# Patient Record
Sex: Female | Born: 1955
Health system: Southern US, Community
[De-identification: ages and names within clinical notes are randomized; demographics above are authoritative.]

## PROBLEM LIST (undated history)

## (undated) DIAGNOSIS — I10 Essential (primary) hypertension: Secondary | ICD-10-CM

## (undated) DIAGNOSIS — I2510 Atherosclerotic heart disease of native coronary artery without angina pectoris: Secondary | ICD-10-CM

## (undated) DIAGNOSIS — K219 Gastro-esophageal reflux disease without esophagitis: Secondary | ICD-10-CM

## (undated) DIAGNOSIS — M549 Dorsalgia, unspecified: Secondary | ICD-10-CM

## (undated) DIAGNOSIS — M1990 Unspecified osteoarthritis, unspecified site: Secondary | ICD-10-CM

## (undated) DIAGNOSIS — F32A Depression, unspecified: Secondary | ICD-10-CM

## (undated) DIAGNOSIS — E785 Hyperlipidemia, unspecified: Secondary | ICD-10-CM

## (undated) DIAGNOSIS — M797 Fibromyalgia: Secondary | ICD-10-CM

## (undated) DIAGNOSIS — G8929 Other chronic pain: Secondary | ICD-10-CM

## (undated) DIAGNOSIS — E119 Type 2 diabetes mellitus without complications: Secondary | ICD-10-CM

## (undated) HISTORY — DX: Atherosclerotic heart disease of native coronary artery without angina pectoris: I25.10

## (undated) HISTORY — PX: TUBAL LIGATION: SHX77

## (undated) HISTORY — PX: HERNIA REPAIR: SHX51

## (undated) HISTORY — PX: POSTERIOR FUSION LUMBAR SPINE: SUR632

## (undated) HISTORY — PX: COLONOSCOPY: SHX174

## (undated) HISTORY — PX: BACK SURGERY: SHX140

## (undated) HISTORY — DX: Hyperlipidemia, unspecified: E78.5

## (undated) HISTORY — PX: VAGINAL HYSTERECTOMY: SUR661

## (undated) HISTORY — DX: Essential (primary) hypertension: I10

---

## 2004-12-23 ENCOUNTER — Inpatient Hospital Stay (HOSPITAL_COMMUNITY)
Admission: RE | Admit: 2004-12-23 | Discharge: 2004-12-28 | Payer: Self-pay | Admitting: Thoracic Surgery (Cardiothoracic Vascular Surgery)

## 2004-12-23 HISTORY — PX: CORONARY ARTERY BYPASS GRAFT: SHX141

## 2005-01-13 ENCOUNTER — Encounter
Admission: RE | Admit: 2005-01-13 | Discharge: 2005-01-13 | Payer: Self-pay | Admitting: Thoracic Surgery (Cardiothoracic Vascular Surgery)

## 2005-04-25 ENCOUNTER — Ambulatory Visit (HOSPITAL_COMMUNITY): Admission: RE | Admit: 2005-04-25 | Discharge: 2005-04-25 | Payer: Self-pay | Admitting: *Deleted

## 2006-04-27 ENCOUNTER — Encounter
Admission: RE | Admit: 2006-04-27 | Discharge: 2006-04-27 | Payer: Self-pay | Admitting: Thoracic Surgery (Cardiothoracic Vascular Surgery)

## 2006-04-27 ENCOUNTER — Ambulatory Visit: Payer: Self-pay | Admitting: Thoracic Surgery (Cardiothoracic Vascular Surgery)

## 2007-03-11 HISTORY — PX: GASTRIC BYPASS: SHX52

## 2008-01-13 ENCOUNTER — Ambulatory Visit (HOSPITAL_COMMUNITY): Admission: RE | Admit: 2008-01-13 | Discharge: 2008-01-13 | Payer: Self-pay | Admitting: Cardiology

## 2008-01-13 HISTORY — PX: CARDIAC CATHETERIZATION: SHX172

## 2008-04-12 HISTORY — PX: DOPPLER ECHOCARDIOGRAPHY: SHX263

## 2010-03-31 ENCOUNTER — Encounter: Payer: Self-pay | Admitting: Thoracic Surgery (Cardiothoracic Vascular Surgery)

## 2010-04-14 ENCOUNTER — Emergency Department (HOSPITAL_COMMUNITY): Payer: BC Managed Care – PPO

## 2010-04-14 ENCOUNTER — Observation Stay (HOSPITAL_COMMUNITY)
Admission: EM | Admit: 2010-04-14 | Discharge: 2010-04-15 | Disposition: A | Discharge status: Home / Self Care | Payer: BC Managed Care – PPO | Attending: Cardiology | Admitting: Cardiology

## 2010-04-14 DIAGNOSIS — E119 Type 2 diabetes mellitus without complications: Secondary | ICD-10-CM | POA: Diagnosis present

## 2010-04-14 DIAGNOSIS — I10 Essential (primary) hypertension: Secondary | ICD-10-CM | POA: Diagnosis present

## 2010-04-14 DIAGNOSIS — R0989 Other specified symptoms and signs involving the circulatory and respiratory systems: Secondary | ICD-10-CM | POA: Insufficient documentation

## 2010-04-14 DIAGNOSIS — M129 Arthropathy, unspecified: Secondary | ICD-10-CM | POA: Insufficient documentation

## 2010-04-14 DIAGNOSIS — R0609 Other forms of dyspnea: Secondary | ICD-10-CM | POA: Insufficient documentation

## 2010-04-14 DIAGNOSIS — J45909 Unspecified asthma, uncomplicated: Secondary | ICD-10-CM | POA: Insufficient documentation

## 2010-04-14 DIAGNOSIS — I2510 Atherosclerotic heart disease of native coronary artery without angina pectoris: Secondary | ICD-10-CM | POA: Diagnosis present

## 2010-04-14 DIAGNOSIS — K219 Gastro-esophageal reflux disease without esophagitis: Secondary | ICD-10-CM | POA: Diagnosis present

## 2010-04-14 DIAGNOSIS — Z7982 Long term (current) use of aspirin: Secondary | ICD-10-CM

## 2010-04-14 DIAGNOSIS — Z951 Presence of aortocoronary bypass graft: Secondary | ICD-10-CM

## 2010-04-14 DIAGNOSIS — Z9884 Bariatric surgery status: Secondary | ICD-10-CM

## 2010-04-14 DIAGNOSIS — R0789 Other chest pain: Principal | ICD-10-CM | POA: Diagnosis present

## 2010-04-14 DIAGNOSIS — K5730 Diverticulosis of large intestine without perforation or abscess without bleeding: Secondary | ICD-10-CM | POA: Insufficient documentation

## 2010-04-14 LAB — CBC
HCT: 43.2 % (ref 36.0–46.0)
Hemoglobin: 14.8 g/dL (ref 12.0–15.0)
MCH: 30.8 pg (ref 26.0–34.0)
MCHC: 34.3 g/dL (ref 30.0–36.0)
MCV: 89.8 fL (ref 78.0–100.0)
Platelets: 255 10*3/uL (ref 150–400)
RBC: 4.81 MIL/uL (ref 3.87–5.11)
RDW: 12.7 % (ref 11.5–15.5)
WBC: 4.8 10*3/uL (ref 4.0–10.5)

## 2010-04-14 LAB — BASIC METABOLIC PANEL
BUN: 10 mg/dL (ref 6–23)
CO2: 27 mEq/L (ref 19–32)
Calcium: 9.9 mg/dL (ref 8.4–10.5)
Chloride: 104 mEq/L (ref 96–112)
Creatinine, Ser: 0.83 mg/dL (ref 0.4–1.2)
GFR calc Af Amer: >60 mL/min (ref >60)
GFR calc non Af Amer: >60 mL/min (ref >60)
Glucose, Bld: 87 mg/dL (ref 70–99)
Potassium: 4.1 mEq/L (ref 3.5–5.1)
Sodium: 140 mEq/L (ref 135–145)

## 2010-04-14 LAB — DIFFERENTIAL
Basophils Absolute: 0 10*3/uL (ref 0.0–0.1)
Basophils Relative: 1 % (ref 0–1)
Eosinophils Absolute: 0.3 10*3/uL (ref 0.0–0.7)
Eosinophils Relative: 7 % — ABNORMAL HIGH (ref 0–5)
Lymphocytes Relative: 43 % (ref 12–46)
Lymphs Abs: 2.1 10*3/uL (ref 0.7–4.0)
Monocytes Absolute: 0.4 10*3/uL (ref 0.1–1.0)
Monocytes Relative: 8 % (ref 3–12)
Neutro Abs: 2 10*3/uL (ref 1.7–7.7)
Neutrophils Relative %: 41 % — ABNORMAL LOW (ref 43–77)

## 2010-04-14 LAB — MRSA PCR SCREENING: MRSA by PCR: NEGATIVE

## 2010-04-14 LAB — TROPONIN I: Troponin I: 0.01 ng/mL (ref 0.00–0.06)

## 2010-04-14 LAB — PROTIME-INR
INR: 0.99 (ref 0.00–1.49)
Prothrombin Time: 13.3 seconds (ref 11.6–15.2)

## 2010-04-14 LAB — CK TOTAL AND CKMB (NOT AT ARMC)
CK, MB: 1.5 ng/mL (ref 0.3–4.0)
Relative Index: 1.2 (ref 0.0–2.5)
Total CK: 126 U/L (ref 7–177)

## 2010-04-14 LAB — CARDIAC PANEL(CRET KIN+CKTOT+MB+TROPI)
CK, MB: 0.9 ng/mL (ref 0.3–4.0)
Relative Index: INVALID (ref 0.0–2.5)
Total CK: 94 U/L (ref 7–177)
Troponin I: <0.01 ng/mL (ref 0.00–0.06)

## 2010-04-14 LAB — BRAIN NATRIURETIC PEPTIDE: Pro B Natriuretic peptide (BNP): 30 pg/mL (ref 0.0–100.0)

## 2010-04-15 LAB — HEMOGLOBIN A1C
Hgb A1c MFr Bld: 5.3 % (ref <5.7)
Mean Plasma Glucose: 105 mg/dL (ref <117)

## 2010-04-15 LAB — GLUCOSE, CAPILLARY
Glucose-Capillary: 81 mg/dL (ref 70–99)
Glucose-Capillary: 85 mg/dL (ref 70–99)

## 2010-04-15 LAB — LIPID PANEL
Cholesterol: 129 mg/dL (ref 0–200)
HDL: 70 mg/dL (ref >39)
LDL Cholesterol: 56 mg/dL (ref 0–99)
Total CHOL/HDL Ratio: 1.8 RATIO
Triglycerides: 15 mg/dL (ref <150)
VLDL: 3 mg/dL (ref 0–40)

## 2010-04-15 LAB — TSH: TSH: 1.268 u[IU]/mL (ref 0.350–4.500)

## 2010-04-26 NOTE — Discharge Summary (Signed)
Tamara Nunez, Tamara Nunez               ACCOUNT NO.:  [id number]  MEDICAL RECORD NO.:  [id number]           PATIENT TYPE:  I  LOCATION:  2922                         FACILITY:  [facility]  PHYSICIAN:  [place] Tamirra Sienkiewicz, MD         DATE OF BIRTH:  1955/09/18  DATE OF ADMISSION:  04/14/2010 DATE OF DISCHARGE:  04/15/2010                              DISCHARGE SUMMARY   DISCHARGE DIAGNOSES: 1. Chest pain, atypical, negative cardiac enzymes. 2. Hypertension. 3. Gastroesophageal reflux. 4. Diabetes mellitus, type 2.  HOSPITAL COURSE:  Tamara Nunez is a 55 year old African American female with history of coronary artery bypass grafting x2 in October 2006 with LIMA to the proximal LAD and SVG to the distal LAD.  Her last cath was done in 2009 which showed patent left main and proximal LAD and 95% stenosis at the insertion of the LIMA into the proximal LAD with patent vein graft to the distal LAD and normal ejection fraction.  History also includes diabetes mellitus type 2, gastric bypass surgery 4-1/2 years ago, hypertension, gastroesophageal reflux, arthritis, diverticulosis, and asthma.  She presented with chest pain, it is sharp, off and on, since Tuesday and on Wednesday, it was worse and rated as 10/10.  She stated 2 sprays of nitroglycerin provided some relief.  The patient was admitted to step-down unit with current active chest pain, 6/10, for observation.  She was started on VTE heparin.  Cardiac enzymes were checked again x2 q.8 h.  EKG was ordered in the morning and BNP, TSH, lipids, PT/INR, hemoglobin A1c.  Diet was heart healthy.  She was also given Ultram 50 mg p.o. q.6 h. p.r.n. for pain.  As of this morning, April 15, 2010, she has no complaints of pain overnight.  Cardiac enzymes were negative and she has been seen by Dr. Rennis Golden who feels she is stable for discharge with a followup nuclear stress test this week in our office at [facility].  DISCHARGE LABORATORY  DATA:  WBCs 4.8, hemoglobin 14.8, hematocrit 43.2, platelets 255.  INR 0.99.  Sodium 140, potassium 4.1, chloride 104, carbon dioxide 27, glucose 87, BUN 10, creatinine 0.83.  Hemoglobin A1c was 5.3, mean plasma glucose 105.  BNP was 30.  Total cholesterol 129, triglycerides 15, HDL 70, LDL 56.  Thyroid stimulating hormone 1.268.  STUDIES/PROCEDURES:  Chest x-ray, no acute cardiopulmonary disease. Normal heart size and mediastinal contours for age.  DISCHARGE MEDICATIONS: 1. Acetaminophen 325 mg tablets, two tablets by mouth every 4 hours as     needed for pain. 2. Albuterol inhaler 1-2 puffs inhaled every 4 hours as needed for     shortness of breath and wheezing. 3. Aspirin enteric coated 325 mg one tablet by mouth every morning. 4. Calcium over the counter 1 tablet by mouth twice daily. 5. Cetirizine 10 mg one tablet by mouth every morning. 6. Lasix 20 mg one tablet by mouth daily as needed for swelling. 7. Metoprolol tartrate 25 mg one tablet by mouth twice daily. 8. Multivitamin therapeutic one tablet by mouth twice daily. 9. Pantoprazole 40 mg one tablet by mouth twice daily.  10.Ramipril 5 mg one capsule by mouth every evening. 11.Ranexa 1000 mg one tablet by mouth twice daily. 12.Simvastatin 20 mg one tablet by mouth every evening. 13.Veramyst spray as needed. 14.Vitamin B12 - 1000 mcg one tablet by mouth every morning. 15.Vitamin D 1000 units one capsule by mouth every morning.  DISPOSITION:  Tamara Nunez will be discharged home in stable condition. She is to eat a heart-healthy diet.  She will follow up at [facility] for a stress test and our office will call her with the appointment time, and she will have a subsequent followup with Dr. Lynnea Ferrier thereafter and our office will call her with that time as well.    ______________________________ Wilburt Finlay, PA   ______________________________ [place] Keirah Konitzer, MD    BH/MEDQ  D:  04/15/2010  T:   04/16/2010  Job:  [id number]  cc:   Ritta Slot, MD  Electronically Signed by Wilburt Finlay PA on 04/24/2010 03:59:14 PM Electronically Signed by Kirtland Bouchard. Alania Overholt M.D. on 04/26/2010 11:18:03 AM

## 2010-04-30 ENCOUNTER — Other Ambulatory Visit (HOSPITAL_COMMUNITY): Payer: Self-pay | Admitting: Gastroenterology

## 2010-04-30 DIAGNOSIS — R112 Nausea with vomiting, unspecified: Secondary | ICD-10-CM

## 2010-05-20 ENCOUNTER — Ambulatory Visit (HOSPITAL_COMMUNITY)
Admission: RE | Admit: 2010-05-20 | Discharge: 2010-05-20 | Disposition: A | Discharge status: Home / Self Care | Payer: BC Managed Care – PPO | Source: Ambulatory Visit | Attending: Gastroenterology | Admitting: Gastroenterology

## 2010-05-20 DIAGNOSIS — R112 Nausea with vomiting, unspecified: Secondary | ICD-10-CM | POA: Insufficient documentation

## 2010-05-20 DIAGNOSIS — R109 Unspecified abdominal pain: Secondary | ICD-10-CM | POA: Insufficient documentation

## 2010-05-20 MED ORDER — TECHNETIUM TC 99M MEBROFENIN IV KIT
5.0000 | PACK | Freq: Once | INTRAVENOUS | Status: AC | PRN
  Administered 2010-05-20: 5 via INTRAVENOUS

## 2010-07-26 NOTE — Discharge Summary (Signed)
NAMEISADORE, Tamara Nunez               ACCOUNT NO.:  [id number]   MEDICAL RECORD NO.:  [id number]          PATIENT TYPE:  INP   LOCATION:  2041                         FACILITY:  [facility]   PHYSICIAN:  Salvatore Decent. Dorris Fetch, M.D.DATE OF BIRTH:  1955/08/10   DATE OF ADMISSION:  12/23/2004  DATE OF DISCHARGE:  12/28/2004                                 DISCHARGE SUMMARY   PRIMARY ADMITTING DIAGNOSIS:  Coronary artery disease.   ADDITIONAL/DISCHARGE DIAGNOSES:  1.  Single vessel coronary artery disease.  2.  Refractory angina.  3.  Morbid obesity.  4.  Type 2 diabetes mellitus.  5.  Hypertension.  6.  Gastroesophageal reflux disease.  7.  Arthritis.  8.  Status post two previous back surgeries.  9.  Diverticulosis.  10. Asthma.   PROCEDURES PERFORMED:  1.  Off-pump coronary artery bypass grafting x2 (left internal mammary      artery to the proximal LAD, saphenous vein graft to the distal LAD).  2.  Open saphenous vein harvest right lower leg.   HISTORY:  The patient is a 55 year old black female who was referred to Dr.  Dorris Fetch by Dr. Rico Junker in [place] for evaluation of coronary  artery disease.  She has had refractory angina for over a year now and has  been treated medically without significant improvement. Her symptoms have  continued to progress and she underwent recent cardiac catheterization which  showed no significant disease in the circumflex with a right system total  occlusion of the LAD with second total occlusion in the mid portion of the  vessel. And Dr. Dorris Fetch reviewed his films and agreed that the best  course of action would be to proceed with surgical revascularization as she  was a poor candidate for percutaneous intervention. He explained the risks,  benefits and alternatives of the procedure to the patient and she agreed to  surgery.   HOSPITAL COURSE:  Ms. Bloch was admitted to [facility] on December 23, 2004 and was taken to the  operating room where she underwent off-pump  coronary artery bypass grafting x2 as described in detail above. She  tolerated the procedure well and was transferred to the SICU in stable  condition. She was able to be extubated shortly after surgery. She was  hemodynamically stable and doing well on postop day #1. Late in the day on  postop day #1, she was transferred to the floor. Overall, she has had an  uneventful postoperative course. She is ambulating in the halls without  problem with cardiac rehab phase one as well as independently. She has  maintained normal sinus rhythm and has remained afebrile with stable vital  signs throughout her admission. She has been somewhat volume overloaded and  has diuresed very well but is still 2-3 pounds above her preoperative  weight. She did develop some serosanguineous drainage from the upper portion  of her sternal incision and was started on Keflex. She is tolerating regular  diet and is having normal bowel and bladder function. Her most recent labs  showed a hemoglobin of 10.6, hematocrit 31.3, white count  12.3, platelets  293, sodium of 138, potassium 3.8, BUN 13, creatinine 1.1.  Her blood sugars  have remained stable on her home medication regimen.  It is felt if she  continues to progress over the next 24 hours and no acute changes occur, she  should hopefully be ready for discharge home on December 28, 2004.   DISCHARGE MEDICATIONS:  1.  Enteric-coated aspirin 325 mg q.d.  2.  Atenolol 50 mg q.d.  3.  Altace 5 mg q.d.  4.  Zocor 20 mg q.d.  5.  Keflex 500 mg t.i.d. x1 week.  6.  Nu-Iron 150 mg q.d.  7.  Folic acid 1 mg q.d.  8.  Lasix 40 mg q.d. x1 week.  9.  K-Dur 20 mEq q.h.s. x1 week.  10. Prevacid 30 mg b.i.d.  11. Allegra 180 mg q.d.  12. Starlix 120 mg t.i.d.  13. Singulair 10 mg q.d.  14. Tylox 1-2 q.4h. p.r.n. for pain.   DISCHARGE INSTRUCTIONS:  She is asked to refrain from driving, heavy lifting  or strenuous activity.  She may continue ambulating daily and using her  incentive spirometer. She may shower daily and clean her incisions with soap  and water. She will continue a low-fat, low-sodium, carbohydrate modified  diet.   DISCHARGE FOLLOWUP:  She is asked to make an appointment to see Dr. Rico Junker in two weeks. She will see Dr. Dorris Fetch on November 6th at 12 p.m.  She will have a chest x-ray at [facility] one hour prior to  this appointment and she will bring her films for him to review. She will  call our office in the interim if she experiences any problems or has  questions.      Coral Ceo, P.A.    ______________________________  Salvatore Decent Dorris Fetch, M.D.    GC/MEDQ  D:  12/27/2004  T:  12/27/2004  Job:  [id number]   cc:   [facility]. [facility]  [address]  [place], [place] [address]   Dr. Darrell Jewel

## 2010-07-26 NOTE — Cardiovascular Report (Signed)
Tamara Nunez, Tamara Nunez               ACCOUNT NO.:  [id number]   MEDICAL RECORD NO.:  [id number]          PATIENT TYPE:  OIB   LOCATION:  2899                         FACILITY:  [facility]   PHYSICIAN:  Darlin Priestly, MD  DATE OF BIRTH:  1955-12-05   DATE OF PROCEDURE:  04/25/2005  DATE OF DISCHARGE:                              CARDIAC CATHETERIZATION   PROCEDURES:  1.  Left heart catheterization.  2.  Coronary angiography.  3.  Left ventriculogram.  4.  Saphenous vein graft angiography.  5.  Left internal mammary angiography.   COMPLICATIONS:  None.   INDICATION:  Tamara Nunez is a 55 year old female, patient of Dr. Caryl Asp  in [place], [place], with a history of non-insulin-dependent diabetes  mellitus, history of CAD, status post bypass surgery in October 2006 by Dr.  Jamesetta Orleans, consisting of a LIMA to LAD and vein graft to the distal  LAD.  She recently has complained of increasing substernal chest pain,  reminiscent of her prior angina.  She is now brought for repeat  catheterization to reassess her graft status.    After informed consent, the patient was brought to the cardiac cath lab.  The right and left groin were shaved, prepped and draped in the usual  sterile fashion.  ECG monitoring  established.  Using modified Seldinger  technique, a #6 French arterial sheath was inserted in the right femoral  artery.  A #5 French venous sheath was inserted in the right femoral vein.  A 6 French diagnostic catheter was needed to perform diagnostic angiography.   The left main is a large vessel with no significant disease.   LAD is a large vessel __________ 2 diagonal branches, and it is totally  occluded.  The mid LAD fills via a LIMA, which appears to be atretic and  appears to have a high-grade stenosis prior to the insertion in the LAD,  though this is very tortuous vessel and would make percutaneous intervention  of the IMA unfavorable.  There is a flow into the  LAD via the IMA.   There is also a patent vein graft inserted into the distal portion of the  LAD, which is a small graft and does have some pressure damping when  engaged, but it does not appear to have any high-grade stenosis.  There does  not appear to be a stenotic lesion beyond the graft.  This does retrograde  fill the proximal portion of the LAD and a third small diagonal branch.   Left circumflex is medium-sized vessel that courses in the AV groove  __________ marginal branches.  The AV groove circumflex has mild 20%  irregularity, but has no  high-grade stenosis.   First OM is a small-to-medium sized vessel with 70% ostial lesion.   Second OM is a large vessel with no significant disease.   The third OM is a small vessels with no significant disease.   The right coronary artery is a large vessel is dominant, PDS, __________  branch.  There is mild 30% diffuse mid RCA narrowing, but no high-grade  stenosis.  The __________  posterolateral branch have no significant disease.   Left ventriculogram has a low-normal EF of 50% with mild anterolateral  hypokinesis.   HEMODYNAMICS:  Systemic arterial pressure 145/85, LV systolic pressure  130/2, LVDP of 5.   CONCLUSIONS:  1.  Significant 1-vessel CAD.  2.  Patent LIMA to the LAD; however, the IMA appears atretic and is very      tortuous with a probable high-grade stenosis prior to the insertion,      though this would be unfavorable from a percutaneous standpoint      secondary to tortuosity.  3.  Patent saphenous vein graft to the distal LAD.  4.  Normal LV systolic function with mild wall motion abnormalities noted      above.  5.  Systemic hypertension.      Darlin Priestly, MD  Electronically Signed     RHM/MEDQ  D:  04/25/2005  T:  04/25/2005  Job:  [id number]   cc:   Caryl Asp, MD  [place], [place]

## 2010-07-26 NOTE — Op Note (Signed)
NAMECATHYANN, KILFOYLE               ACCOUNT NO.:  [id number]   MEDICAL RECORD NO.:  [id number]          PATIENT TYPE:  INP   LOCATION:  2315                         FACILITY:  [facility]   PHYSICIAN:  Salvatore Decent. Dorris Fetch, M.D.DATE OF BIRTH:  14-Feb-1956   DATE OF PROCEDURE:  12/23/2004  DATE OF DISCHARGE:                                 OPERATIVE REPORT   PREOPERATIVE DIAGNOSIS:  Single-vessel coronary disease with refractory  angina.   POSTOPERATIVE DIAGNOSIS:  Single-vessel coronary disease with refractory  angina.   PROCEDURE:  Median sternotomy, off-pump coronary artery bypass grafting x2  (left internal mammary artery to proximal left anterior descending artery,  saphenous vein graft to distal left anterior descending artery).   SURGEON:  Salvatore Decent. Dorris Fetch, M.D.   ASSISTANT:  Pecola Leisure, P.A.   ANESTHESIA:  General.   FINDINGS:  Morbid obesity.  Good-quality conduits.  Distal LAD fair-quality,  proximal LAD poor-quality targets.   CLINICAL NOTE:  Ms. Ivanov is a 55 year old African-American female who is  morbidly obese has multiple other cardiac risk factors including diabetes.  She approximately a year ago was found to have a positive stress test and a  total occlusion of her LAD, which reconstituted via collaterals with both  the proximal and distal segment.  She had been managed medically but  continued to have refractory angina, which had gotten progressively worse.  She underwent repeat cardiac catheterization, which showed no additional  coronary disease but confirmed the LAD disease as previously seen.  Because  of angina refractory to maximal medical therapy, she was referred for  coronary artery bypass grafting.  The indications, risks, benefits and  alternatives for the procedure were discussed in detail with the patient,  including the possibility that she would continue to have angina  postoperatively.  She understood and accepted the risk of surgery  and agreed  to proceed.   OPERATIVE NOTE:  Ms. Sarwar was brought the preop holding area on December 23, 2004.  There the anesthesia service placed lines to monitor arterial,  central venous and pulmonary arterial pressure.  Intravenous antibiotics  were administered.  She was taken to the operating room, anesthetized and  intubated.  A Foley catheter was placed.  The chest, abdomen and legs were  prepped and draped in the usual fashion.   A median sternotomy was performed.  The left internal mammary artery was  harvested using standard technique.  This was very difficult due the  patient's obese body habitus, but the mammary artery was a relatively small  but good-quality conduit which had excellent flow once taken down.  Simultaneously an incision was made in the medial aspect of the right leg at  the level of the ankle and the saphenous vein was harvested from the lower  leg using open technique.  This wound was closed in standard fashion.   The patient was given a full heparin dose for off-pump grafting prior to  dividing the distal end of the mammary artery.  There was excellent flow  through the cut end of the vessel.  The pericardium then was opened.  The  patient was placed in Trendelenburg position and rotated to the right side.  Deep pericardial stay sutures were placed in the pericardium and retracted  to prolapse the apex of the heart anteriorly, which allowed good  visualization of the LAD.  The LAD was inspected both in its proximal and  distal aspects and anastomotic sites were chosen.  After confirming adequate  anticoagulation with ACT measurement, Silastic tapes were placed proximally  and distally to the site selected for an anastomosis in the distal LAD.  The  Guidant stabilizer system was used to stabilize the heart.  The patient  tolerated this well without any hemodynamic changes or signs of ischemia  throughout either anastomosis.  An arteriotomy was made in the  distal LAD.  This was a 1.5-mm fair-quality vessel at this site.  The vein graft was then  anastomosed end-to-side with a running 7-0 Prolene suture.  At the  completion of the anastomosis, a probe passed easily proximally and  distally.  The Silastic tapes were released to allow de-airing, and the  suture was tied.  There was excellent flow through this graft.  The heart  then was allowed to rest back in its standard position.  The graft was cut  to length proximally.  A partial occlusion clamp was placed on the ascending  aorta.  A 4.5 mm punch aortotomy was made in the aorta and the vein graft  was anastomosed to the aorta with a running 6-0 Prolene suture, and this  anastomosis was de-aired by releasing the partial clamp before tying the  suture.   The deep pericardial sutures were once again retracted, again prolapsing  apex of the heart anteriorly.  Again the patient tolerated this well  hemodynamically.  Next the proximal LAD was exposed.  An arteriotomy was  made, Silastic tapes were then placed proximally and distally.  There was  backbleeding through collaterals.  The left mammary artery was brought  through a window in the pericardium.  The distal limb was spatulated then  was anastomosed end-to-side to the LAD with a running 8-0 Prolene suture.  At completion of the anastomosis, the  bulldog clamp was removed from the  mammary artery, de-airing the anastomosis.  The mammary pedicle was tacked  to the epicardial surface of the heart with  6-0 Prolene sutures.  There was  good hemostasis at the anastomosis.   All anastomoses then were reinspected for hemostasis.  The deep pericardial  sutures were removed.  The chest was irrigated with 1 L of warm normal  saline and 1 g of vancomycin.  Hemostasis was achieved.  The pericardium was  reapproximated with interrupted 3-0 silk sutures.  It came together easily without tension.  A single mediastinal tube and a single left pleural  tube  were placed through separate subcostal incisions.  Pacing wires were placed  on the atrium and ventricle prior to closing the pericardium.  The protamine  was administered without incident.  The chest then was closed using simple  and figure-of-eight heavy-gauge double stainless steel wires.  The  pectoralis fascia was closed with running #1 Vicryl suture.  The  subcutaneous tissue and skin were closed in standard fashion.  A  subcuticular closure was used for the skin.  All sponge, needle and  instrument counts were correct at the end of the procedure.  There no  intraoperative complications.  The patient was taken from the operating room  to the surgical intensive care unit in critical but  stable condition.           ______________________________  Salvatore Decent Dorris Fetch, M.D.     SCH/MEDQ  D:  12/23/2004  T:  12/23/2004  Job:  [id number]   cc:   Rico Junker, M.D.  [place], [place]   Ladean Raya, M.D.  [place], [place]

## 2010-12-10 LAB — CBC
HCT: 40.5
Hemoglobin: 13.6
MCHC: 33.6
MCV: 94.2
Platelets: 248
RBC: 4.3
RDW: 12.6
WBC: 6.2

## 2010-12-10 LAB — BASIC METABOLIC PANEL
BUN: 12
CO2: 27
Calcium: 9.2
Chloride: 106
Creatinine, Ser: 0.76
GFR calc Af Amer: >60
GFR calc non Af Amer: >60
Glucose, Bld: 77
Potassium: 4.3
Sodium: 140

## 2010-12-10 LAB — PROTIME-INR
INR: 1.1
Prothrombin Time: 14.3

## 2010-12-10 LAB — GLUCOSE, CAPILLARY: Glucose-Capillary: 69 — ABNORMAL LOW

## 2012-06-01 ENCOUNTER — Other Ambulatory Visit (HOSPITAL_COMMUNITY): Payer: Self-pay | Admitting: Internal Medicine

## 2012-06-01 DIAGNOSIS — R079 Chest pain, unspecified: Secondary | ICD-10-CM

## 2012-06-03 ENCOUNTER — Ambulatory Visit (HOSPITAL_COMMUNITY)
Admission: RE | Admit: 2012-06-03 | Discharge: 2012-06-03 | Disposition: A | Discharge status: Home / Self Care | Payer: Commercial Managed Care - PPO | Source: Ambulatory Visit | Attending: Internal Medicine | Admitting: Internal Medicine

## 2012-06-03 DIAGNOSIS — R079 Chest pain, unspecified: Secondary | ICD-10-CM | POA: Insufficient documentation

## 2012-06-03 DIAGNOSIS — J45909 Unspecified asthma, uncomplicated: Secondary | ICD-10-CM | POA: Insufficient documentation

## 2012-06-03 DIAGNOSIS — I2510 Atherosclerotic heart disease of native coronary artery without angina pectoris: Secondary | ICD-10-CM | POA: Insufficient documentation

## 2012-06-03 DIAGNOSIS — R0989 Other specified symptoms and signs involving the circulatory and respiratory systems: Secondary | ICD-10-CM | POA: Insufficient documentation

## 2012-06-03 DIAGNOSIS — R42 Dizziness and giddiness: Secondary | ICD-10-CM | POA: Insufficient documentation

## 2012-06-03 DIAGNOSIS — R0609 Other forms of dyspnea: Secondary | ICD-10-CM | POA: Insufficient documentation

## 2012-06-03 DIAGNOSIS — I10 Essential (primary) hypertension: Secondary | ICD-10-CM | POA: Insufficient documentation

## 2012-06-03 DIAGNOSIS — Z951 Presence of aortocoronary bypass graft: Secondary | ICD-10-CM | POA: Insufficient documentation

## 2012-06-03 HISTORY — PX: NM MYOVIEW LTD: HXRAD82

## 2012-06-03 MED ORDER — TECHNETIUM TC 99M SESTAMIBI GENERIC - CARDIOLITE
30.4000 | Freq: Once | INTRAVENOUS | Status: AC | PRN
  Administered 2012-06-03: 30 via INTRAVENOUS

## 2012-06-03 MED ORDER — REGADENOSON 0.4 MG/5ML IV SOLN
0.4000 mg | Freq: Once | INTRAVENOUS | Status: AC
  Administered 2012-06-03: 0.4 mg via INTRAVENOUS

## 2012-06-03 MED ORDER — TECHNETIUM TC 99M SESTAMIBI GENERIC - CARDIOLITE
10.6000 | Freq: Once | INTRAVENOUS | Status: AC | PRN
  Administered 2012-06-03: 11 via INTRAVENOUS

## 2012-06-03 NOTE — Procedures (Addendum)
[facility] [address] [place] [address] [phone]  Cardiology Nuclear Med Study  Tamara Nunez is a 57 y.o. female     MRN : [id number]     DOB: 12-05-1955  Procedure Date: 06/03/2012  Nuclear Med Background Indication for Stress Test:  Graft Patency History:  Asthma and SEASONAL ASTHMATIC;CAD;CABG X2--2006 Cardiac Risk Factors: Family History - CAD, Hypertension, Lipids, NIDDM and Overweight  Symptoms:  Chest Pain, DOE, Fatigue, Light-Headedness and SOB   Nuclear Pre-Procedure Caffeine/Decaff Intake:  10:00pm NPO After: 8:00am   IV Site: R Hand  IV 0.9% NS with Angio Cath:  22g  Chest Size (in):  N/A IV Started by: Emmit Pomfret, RN  Height: 5\' 5"  (1.651 m)  Cup Size: D  BMI:  Body mass index is 25.46 kg/(m^2). Weight:  153 lb (69.4 kg)   Tech Comments:  N/A    Nuclear Med Study 1 or 2 day study: 1 day  Stress Test Type:  Lexiscan  Order Authorizing Provider:  KENNETH [place] HILTY, MD    Resting Radionuclide: Technetium 59m Sestamibi  Resting Radionuclide Dose: 10.6 mCi   Stress Radionuclide:  Technetium 20m Sestamibi  Stress Radionuclide Dose: 30.4 mCi           Stress Protocol Rest HR: 60 Stress HR: 118  Rest BP: 128/78 Stress BP: 136/72  Exercise Time (min): n/a METS: n/a   Predicted Max HR: 163 bpm % Max HR: 72.39 bpm Rate Pressure Product: 21308  Dose of Adenosine (mg):  n/a Dose of Lexiscan: 0.4 mg  Dose of Atropine (mg): n/a Dose of Dobutamine: n/a mcg/kg/min (at max HR)  Stress Test Technologist: Esperanza Sheets, CCT Nuclear Technologist: Gonzella Lex, CNMT   Rest Procedure:  Myocardial perfusion imaging was performed at rest 45 minutes following the intravenous administration of Technetium 45m Sestamibi. Stress Procedure:  The patient received IV Lexiscan 0.4 mg over 15-seconds.  Technetium 38m Sestamibi injected at 30-seconds.  There were no significant changes with Lexiscan.   Quantitative spect images were obtained after a 45 minute delay.  Transient Ischemic Dilatation (Normal <1.22):  1.14 Lung/Heart Ratio (Normal <0.45):  0.22 QGS EDV:  68 ml QGS ESV:  20 ml LV Ejection Fraction: 71%  Signed by: Gonzella Lex, CNMT  Rest ECG: NSR - Normal EKG  Stress ECG: There was definite T wave inversion in II, III, aVF as well as V2-V6 with Lexiscan infusion,that resolved post infusion.  QPS Raw Data Images:  There is no interference from nuclear activity from structures below the diaphragm.  , Mild breast attenuation; normal left ventricular size. Stress Images:  Normal homogeneous uptake in all areas of the myocardium. Rest Images:  Normal homogeneous uptake in all areas of the myocardium. Subtraction (SDS):  Minor anteroapical breast attenuation noted; No reversibility and no evidence of ischemia or infarction.  Impression Exercise Capacity:  Lexiscan with no exercise. BP Response:  Normal blood pressure response. Clinical Symptoms:  There is dyspnea. ECG Impression:  Notable Inerior and Anterior TWI with Lexiscan -- non-specific with normal imaging study LV Wall Motion:  NL LV Function; NL Wall Motion  Comparison with Prior Nuclear Study: No significant change from previous study  Overall Impression:  Normal stress nuclear study.,  Low risk stress nuclear study.   Marykay Lex, MD  06/03/2012 1:03 PM

## 2012-10-19 IMAGING — CR DG CHEST 1V PORT
1 series · 1 of 1 positions shown · non-contrast
Comparison: 01/13/2008

CLINICAL DATA: Chest discomfort for 2 days.  Diabetic with
hypertension.

PORTABLE CHEST - 1 VIEW

[view not recorded]
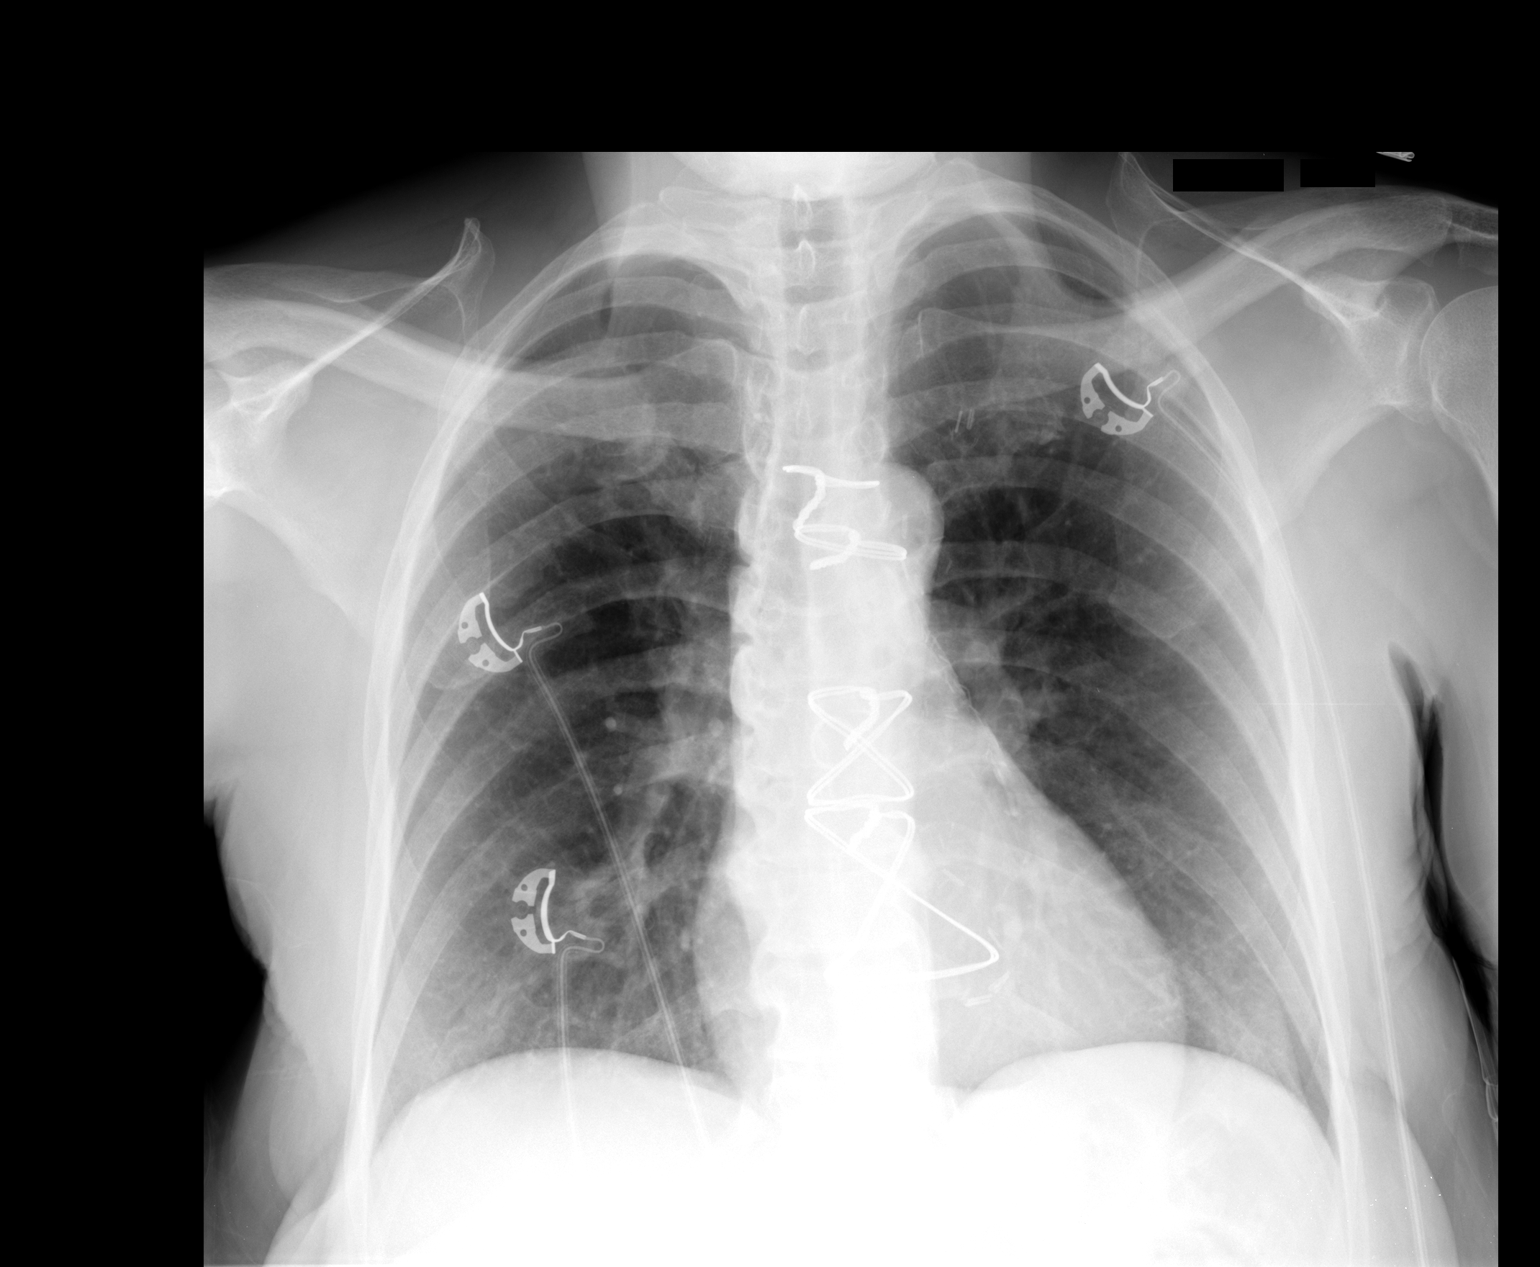

[1 of 1 positions shown; findings below may reference images not displayed]

FINDINGS: Prior median sternotomy. Midline trachea.  Normal heart
size and mediastinal contours for age.   No pleural effusion or
pneumothorax.  Clear lungs.
IMPRESSION: No acute cardiopulmonary disease.

## 2012-11-29 ENCOUNTER — Ambulatory Visit (INDEPENDENT_AMBULATORY_CARE_PROVIDER_SITE_OTHER): Payer: Medicare Other | Admitting: Internal Medicine

## 2012-11-29 ENCOUNTER — Encounter: Payer: Self-pay | Admitting: Internal Medicine

## 2012-11-29 VITALS — BP 130/82 | HR 67 | Ht 65.0 in | Wt 157.0 lb

## 2012-11-29 DIAGNOSIS — I2089 Other forms of angina pectoris: Secondary | ICD-10-CM | POA: Insufficient documentation

## 2012-11-29 DIAGNOSIS — E785 Hyperlipidemia, unspecified: Secondary | ICD-10-CM | POA: Insufficient documentation

## 2012-11-29 DIAGNOSIS — I209 Angina pectoris, unspecified: Secondary | ICD-10-CM

## 2012-11-29 DIAGNOSIS — Z951 Presence of aortocoronary bypass graft: Secondary | ICD-10-CM | POA: Insufficient documentation

## 2012-11-29 DIAGNOSIS — E119 Type 2 diabetes mellitus without complications: Secondary | ICD-10-CM

## 2012-11-29 DIAGNOSIS — I10 Essential (primary) hypertension: Secondary | ICD-10-CM | POA: Insufficient documentation

## 2012-11-29 DIAGNOSIS — E1159 Type 2 diabetes mellitus with other circulatory complications: Secondary | ICD-10-CM | POA: Insufficient documentation

## 2012-11-29 DIAGNOSIS — I208 Other forms of angina pectoris: Secondary | ICD-10-CM

## 2012-11-29 DIAGNOSIS — I2510 Atherosclerotic heart disease of native coronary artery without angina pectoris: Secondary | ICD-10-CM | POA: Insufficient documentation

## 2012-11-29 MED ORDER — RANOLAZINE ER 1000 MG PO TB12: 1000.0000 mg | ORAL_TABLET | Freq: Two times a day (BID) | ORAL | Status: DC

## 2012-11-29 NOTE — Patient Instructions (Addendum)
Your physician wants you to follow-up in: 6 months. You will receive a reminder letter in the mail two months in advance. If you don't receive a letter, please call our office to schedule the follow-up appointment.  Please ask your primary care provider to order some lab work at your next visit - if you can, please have them fax over copies of the results to [phone]

## 2012-11-29 NOTE — Progress Notes (Signed)
OFFICE NOTE  Chief Complaint:  Routine followup  Primary Care Physician: No primary provider on file.  HPI:  Tamara Nunez  is a 57 year old female with history of coronary disease. She had CABG in 2006, LIMA to LAD, saphenous vein graft to distal LAD. She had chest pain and some fatigue after that in 2009 and had a heart cath which showed widely patent grafts. She has continued to have atypical chest pain which worsens with positioning but seemed to improve with nitroglycerin spray. However, she was given isosorbide which she reports today has not helped her symptoms but reported it had helped her in the past. The main side effect is significant headache on isosorbide which is bothersome for her. She also takes Ranexa. She continues to have chest pain and heaviness mostly on her left side but is also a soreness. She says it is more sore when she pushes on the chest wall underneath the tail of the breast. She had a mammogram which currently was negative for any mass and, again, the symptoms do sound to be more musculoskeletal, possibly a radiculopathy either from the cervical root or perhaps a thoracic nerve that comes around the chest wall. She does have a history of reflux but has not had complaints regarding that and I had recommended a stress test to look for any reversible ischemia that could be causing her symptoms. The stress test performed on June 03, 2012, was low risk demonstrating no significant reversibility. The EKG did show some T-wave changes in 2, 3 and aVF and laterally V2 through V6 with Lexiscan that resolved post infusion. However, this is nonspecific.   She returns today with no specific complaints. She occasionally gets chest discomfort from time to time however the symptoms are well managed. Of note she has not had any recent blood work to look at her cholesterol profile  PMHx:  Past Medical History  Diagnosis Date  . Coronary artery disease   . Diabetes mellitus  without complication   . Hyperlipidemia   . Hypertension     Past Surgical History  Procedure Laterality Date  . Cardiac catheterization    . Gastric bypass  2009  . Coronary artery bypass graft  2006    2 vessel - LIMA to LAD (atretic), SVG to distal LAD    FAMHx:  Family History  Problem Relation Age of Onset  . Heart disease Mother   . Cancer - Lung Father     SOCHx:   reports that she has never smoked. She has never used smokeless tobacco. She reports that she does not drink alcohol or use illicit drugs.  ALLERGIES:  Allergies  Allergen Reactions  . Codeine Other (See Comments)    UNSPECIFIED   . Simvastatin Other (See Comments)    UNSPECIFIED     ROS: A comprehensive review of systems was negative.  HOME MEDS: Current Outpatient Prescriptions  Medication Sig Dispense Refill  . albuterol (PROVENTIL HFA;VENTOLIN HFA) 108 (90 BASE) MCG/ACT inhaler Inhale 2 puffs into the lungs every 6 (six) hours as needed for wheezing.      Marland Kitchen aspirin 325 MG EC tablet Take 325 mg by mouth daily.      . Calcium Carbonate-Vitamin D (CALCIUM 600 + D PO) Take by mouth 2 (two) times daily.      . cetirizine (ZYRTEC) 10 MG tablet Take 10 mg by mouth daily.      . fluticasone (VERAMYST) 27.5 MCG/SPRAY nasal spray Place 2 sprays into the  nose daily.      . furosemide (LASIX) 20 MG tablet Take 20 mg by mouth daily as needed.      . isosorbide mononitrate (IMDUR) 30 MG 24 hr tablet Take 30 mg by mouth daily.      . metFORMIN (GLUCOPHAGE) 500 MG tablet Take 500 mg by mouth 2 (two) times daily with a meal.      . metoprolol tartrate (LOPRESSOR) 25 MG tablet Take 25 mg by mouth 2 (two) times daily.      . Multiple Vitamins-Minerals (MULTIVITAMIN PO) Take by mouth daily.      . nitroGLYCERIN (NITROLINGUAL) 0.4 MG/SPRAY spray Place 1 spray under the tongue every 5 (five) minutes as needed for chest pain.      . pantoprazole (PROTONIX) 40 MG tablet Take 40 mg by mouth daily.      . ramipril  (ALTACE) 5 MG capsule Take 5 mg by mouth daily.      . ranolazine (RANEXA) 1000 MG SR tablet Take 1 tablet (1,000 mg total) by mouth 2 (two) times daily.  56 tablet  0  . simvastatin (ZOCOR) 20 MG tablet Take 20 mg by mouth at bedtime.      . vitamin B-12 (CYANOCOBALAMIN) 1000 MCG tablet Take 1,000 mcg by mouth daily.      . vitamin E 400 UNIT capsule Take 400 Units by mouth daily.       No current facility-administered medications for this visit.    LABS/IMAGING: No results found for this or any previous visit (from the past 48 hour(s)). No results found.  VITALS: BP 130/82  Pulse 67  Ht 5\' 5"  (1.651 m)  Wt 157 lb (71.215 kg)  BMI 26.13 kg/m2  EXAM: General appearance: alert and no distress Neck: no adenopathy, no carotid bruit, no JVD, supple, symmetrical, trachea midline and thyroid not enlarged, symmetric, no tenderness/mass/nodules Lungs: clear to auscultation bilaterally Heart: regular rate and rhythm, S1, S2 normal, no murmur, click, rub or gallop Abdomen: soft, non-tender; bowel sounds normal; no masses,  no organomegaly Extremities: extremities normal, atraumatic, no cyanosis or edema Pulses: 2+ and symmetric Skin: Skin color, texture, turgor normal. No rashes or lesions Neurologic: Grossly normal  EKG: Normal sinus rhythm at 67  ASSESSMENT: 1. Coronary artery disease status post CABG (LIMA to LAD-atretic, SVG to distal LAD) 2. Hypertension-controlled 3. Dyslipidemia-on statin 4. Stable angina 5. Diabetes type 2  PLAN: 1.   Mrs. Dougher is doing well from a symptomatic standpoint. She occasionally gets some chest pain, but for the most part has stable angina. She recently had a stress test this year which was negative. She is on good antianginal medications. She will later repeat check of her lipid profile and surveillance of her diabetes which can be arranged through her primary care provider in [place]. I asked her to send results of those to our office.  He'll  plan to see her back annually or sooner as necessary.  Chrystie Nose, MD, [facility] Attending Cardiologist [facility]  Shiela Bruns C 11/29/2012, 4:14 PM

## 2013-03-11 DIAGNOSIS — J019 Acute sinusitis, unspecified: Secondary | ICD-10-CM | POA: Diagnosis not present

## 2013-03-11 DIAGNOSIS — J209 Acute bronchitis, unspecified: Secondary | ICD-10-CM | POA: Diagnosis not present

## 2013-03-11 DIAGNOSIS — E1142 Type 2 diabetes mellitus with diabetic polyneuropathy: Secondary | ICD-10-CM | POA: Diagnosis not present

## 2013-03-11 DIAGNOSIS — E1149 Type 2 diabetes mellitus with other diabetic neurological complication: Secondary | ICD-10-CM | POA: Diagnosis not present

## 2013-06-16 ENCOUNTER — Ambulatory Visit (INDEPENDENT_AMBULATORY_CARE_PROVIDER_SITE_OTHER): Payer: Medicare Other | Admitting: Internal Medicine

## 2013-06-16 ENCOUNTER — Encounter: Payer: Self-pay | Admitting: Internal Medicine

## 2013-06-16 VITALS — BP 142/80 | HR 62 | Ht 65.0 in | Wt 160.0 lb

## 2013-06-16 DIAGNOSIS — I10 Essential (primary) hypertension: Secondary | ICD-10-CM | POA: Diagnosis not present

## 2013-06-16 DIAGNOSIS — I2510 Atherosclerotic heart disease of native coronary artery without angina pectoris: Secondary | ICD-10-CM

## 2013-06-16 DIAGNOSIS — Z951 Presence of aortocoronary bypass graft: Secondary | ICD-10-CM | POA: Diagnosis not present

## 2013-06-16 DIAGNOSIS — E785 Hyperlipidemia, unspecified: Secondary | ICD-10-CM | POA: Diagnosis not present

## 2013-06-16 DIAGNOSIS — E119 Type 2 diabetes mellitus without complications: Secondary | ICD-10-CM

## 2013-06-16 MED ORDER — RANOLAZINE ER 1000 MG PO TB12: 1000.0000 mg | ORAL_TABLET | Freq: Two times a day (BID) | ORAL | Status: DC

## 2013-06-16 MED ORDER — RANOLAZINE ER 1000 MG PO TB12: 1000.0000 mg | ORAL_TABLET | Freq: Two times a day (BID) | ORAL | Status: AC

## 2013-06-16 NOTE — Patient Instructions (Signed)
Your physician wants you to follow-up in: 6 months. You will receive a reminder letter in the mail two months in advance. If you don't receive a letter, please call our office to schedule the follow-up appointment.  Ask your doctor about nerve pain meds - gabapentin, pregabalin, cymbalta

## 2013-06-17 ENCOUNTER — Encounter: Payer: Self-pay | Admitting: Internal Medicine

## 2013-06-17 NOTE — Progress Notes (Signed)
OFFICE NOTE  Chief Complaint:  Routine followup  Primary Care Physician: Pcp Not In System  HPI:  Tamara Nunez  is a 58 year old female with history of coronary disease. She had CABG in 2006, LIMA to LAD, saphenous vein graft to distal LAD. She had chest pain and some fatigue after that in 2009 and had a heart cath which showed widely patent grafts. She has continued to have atypical chest pain which worsens with positioning but seemed to improve with nitroglycerin spray. However, she was given isosorbide which she reports today has not helped her symptoms but reported it had helped her in the past. The main side effect is significant headache on isosorbide which is bothersome for her. She also takes Ranexa. She continues to have chest pain and heaviness mostly on her left side but is also a soreness. She says it is more sore when she pushes on the chest wall underneath the tail of the breast. She had a mammogram which currently was negative for any mass and, again, the symptoms do sound to be more musculoskeletal, possibly a radiculopathy either from the cervical root or perhaps a thoracic nerve that comes around the chest wall. She does have a history of reflux but has not had complaints regarding that and I had recommended a stress test to look for any reversible ischemia that could be causing her symptoms. The stress test performed on June 03, 2012, was low risk demonstrating no significant reversibility. The EKG did show some T-wave changes in 2, 3 and aVF and laterally V2 through V6 with Lexiscan that resolved post infusion. However, this is nonspecific.   She returns today with no specific complaints. She occasionally gets chest discomfort from time to time however the symptoms are well managed. She is complaining of some numbness feeling in her fingers and occasionally notes that there is some color change in her fingers in cold weather, which sounds like Raynaud's phenomenon.  PMHx:    Past Medical History  Diagnosis Date  . Coronary artery disease   . Diabetes mellitus without complication   . Hyperlipidemia   . Hypertension     Past Surgical History  Procedure Laterality Date  . Cardiac catheterization    . Gastric bypass  2009  . Coronary artery bypass graft  2006    2 vessel - LIMA to LAD (atretic), SVG to distal LAD    FAMHx:  Family History  Problem Relation Age of Onset  . Heart disease Mother   . Cancer - Lung Father     SOCHx:   reports that she has never smoked. She has never used smokeless tobacco. She reports that she does not drink alcohol or use illicit drugs.  ALLERGIES:  Allergies  Allergen Reactions  . Codeine Other (See Comments)    UNSPECIFIED   . Simvastatin Other (See Comments)    UNSPECIFIED     ROS: A comprehensive review of systems was negative except for: Neurological: positive for paresthesia  HOME MEDS: Current Outpatient Prescriptions  Medication Sig Dispense Refill  . albuterol (PROVENTIL HFA;VENTOLIN HFA) 108 (90 BASE) MCG/ACT inhaler Inhale 2 puffs into the lungs every 6 (six) hours as needed for wheezing.      Marland Kitchen. aspirin 325 MG EC tablet Take 325 mg by mouth daily.      . Calcium Carbonate-Vitamin D (CALCIUM 600 + D PO) Take by mouth 2 (two) times daily.      . cetirizine (ZYRTEC) 10 MG tablet Take 10  mg by mouth daily.      . fluticasone (VERAMYST) 27.5 MCG/SPRAY nasal spray Place 2 sprays into the nose daily.      . furosemide (LASIX) 20 MG tablet Take 20 mg by mouth daily as needed.      . isosorbide mononitrate (IMDUR) 30 MG 24 hr tablet Take 30 mg by mouth daily.      . metFORMIN (GLUCOPHAGE) 500 MG tablet Take 500 mg by mouth 2 (two) times daily with a meal.      . metoprolol tartrate (LOPRESSOR) 25 MG tablet Take 25 mg by mouth 2 (two) times daily.      . Multiple Vitamins-Minerals (MULTIVITAMIN PO) Take by mouth daily.      . nitroGLYCERIN (NITROLINGUAL) 0.4 MG/SPRAY spray Place 1 spray under the tongue  every 5 (five) minutes as needed for chest pain.      . pantoprazole (PROTONIX) 40 MG tablet Take 40 mg by mouth daily.      . ramipril (ALTACE) 5 MG capsule Take 5 mg by mouth daily.      . ranolazine (RANEXA) 1000 MG SR tablet Take 1 tablet (1,000 mg total) by mouth 2 (two) times daily.  56 tablet  0  . simvastatin (ZOCOR) 20 MG tablet Take 20 mg by mouth at bedtime.      . vitamin B-12 (CYANOCOBALAMIN) 1000 MCG tablet Take 1,000 mcg by mouth daily.      . vitamin E 400 UNIT capsule Take 400 Units by mouth daily.       No current facility-administered medications for this visit.    LABS/IMAGING: No results found for this or any previous visit (from the past 48 hour(s)). No results found.  VITALS: BP 142/80  Pulse 62  Ht 5\' 5"  (1.651 m)  Wt 160 lb (72.576 kg)  BMI 26.63 kg/m2  EXAM: General appearance: alert and no distress Neck: no adenopathy, no carotid bruit, no JVD, supple, symmetrical, trachea midline and thyroid not enlarged, symmetric, no tenderness/mass/nodules Lungs: clear to auscultation bilaterally Heart: regular rate and rhythm, S1, S2 normal, no murmur, click, rub or gallop Abdomen: soft, non-tender; bowel sounds normal; no masses,  no organomegaly Extremities: extremities normal, atraumatic, no cyanosis or edema Pulses: 2+ and symmetric Skin: Skin color, texture, turgor normal. No rashes or lesions Neurologic: Grossly normal  EKG: Normal sinus rhythm at 62  ASSESSMENT: 1. Coronary artery disease status post CABG (LIMA to LAD-atretic, SVG to distal LAD) 2. Hypertension-controlled 3. Dyslipidemia-on statin 4. Stable angina 5. Diabetes type 2 6. Tingling and coolness of the fingers, concerning for Raynaud's phenomena  PLAN: 1.   Tamara Nunez is doing well from a symptomatic standpoint. She occasionally gets some chest pain, but for the most part has stable angina. She recently had a stress test last year which was negative. She is on good antianginal  medications. She is reporting some tingling in her fingers which could be neuropathy or perhaps a Raynaud phenomenon as her symptoms are worse in cold weather.  I offered to place her on a low-dose calcium channel blocker but she declined. I will make no other changes her medications today and then see her back in 6 months.  Chrystie Nose, MD, [facility] Attending Cardiologist [facility]  Chrystie Nose 06/17/2013, 5:26 PM

## 2013-07-08 DIAGNOSIS — Z9889 Other specified postprocedural states: Secondary | ICD-10-CM | POA: Diagnosis not present

## 2013-07-08 DIAGNOSIS — M47817 Spondylosis without myelopathy or radiculopathy, lumbosacral region: Secondary | ICD-10-CM | POA: Diagnosis not present

## 2013-07-21 DIAGNOSIS — M79609 Pain in unspecified limb: Secondary | ICD-10-CM | POA: Diagnosis not present

## 2013-07-21 DIAGNOSIS — R262 Difficulty in walking, not elsewhere classified: Secondary | ICD-10-CM | POA: Diagnosis not present

## 2013-07-21 DIAGNOSIS — M549 Dorsalgia, unspecified: Secondary | ICD-10-CM | POA: Diagnosis not present

## 2013-08-11 DIAGNOSIS — K1370 Unspecified lesions of oral mucosa: Secondary | ICD-10-CM | POA: Diagnosis not present

## 2013-08-15 DIAGNOSIS — I10 Essential (primary) hypertension: Secondary | ICD-10-CM | POA: Diagnosis not present

## 2013-08-15 DIAGNOSIS — M109 Gout, unspecified: Secondary | ICD-10-CM | POA: Diagnosis not present

## 2013-08-15 DIAGNOSIS — E119 Type 2 diabetes mellitus without complications: Secondary | ICD-10-CM | POA: Diagnosis not present

## 2013-08-23 DIAGNOSIS — Z981 Arthrodesis status: Secondary | ICD-10-CM | POA: Diagnosis not present

## 2013-08-23 DIAGNOSIS — M48061 Spinal stenosis, lumbar region without neurogenic claudication: Secondary | ICD-10-CM | POA: Diagnosis not present

## 2013-08-23 DIAGNOSIS — M4310 Spondylolisthesis, site unspecified: Secondary | ICD-10-CM | POA: Diagnosis not present

## 2013-10-24 ENCOUNTER — Telehealth: Payer: Self-pay | Admitting: *Deleted

## 2013-10-24 DIAGNOSIS — Z01810 Encounter for preprocedural cardiovascular examination: Secondary | ICD-10-CM | POA: Diagnosis not present

## 2013-10-24 DIAGNOSIS — I498 Other specified cardiac arrhythmias: Secondary | ICD-10-CM | POA: Diagnosis not present

## 2013-10-24 DIAGNOSIS — Z01818 Encounter for other preprocedural examination: Secondary | ICD-10-CM | POA: Diagnosis not present

## 2013-10-24 NOTE — Telephone Encounter (Signed)
That's fine .Marland Kitchen. Nothing further.  Dr. HRexene Edison

## 2013-10-24 NOTE — Telephone Encounter (Signed)
Left message for pt that it is fine to hold the aspirin before procedure.

## 2013-10-24 NOTE — Telephone Encounter (Signed)
Patient is scheduled for a spinal fusion 11-11-13. They tell the patient to hold their aspirin for 5 days prior to the procedure. If dr Rennis Goldenhilty has other recommendations we need to contact the patient at home Will forward for dr hilty review

## 2013-11-11 DIAGNOSIS — I498 Other specified cardiac arrhythmias: Secondary | ICD-10-CM | POA: Diagnosis not present

## 2013-11-11 DIAGNOSIS — J45909 Unspecified asthma, uncomplicated: Secondary | ICD-10-CM | POA: Diagnosis present

## 2013-11-11 DIAGNOSIS — E1142 Type 2 diabetes mellitus with diabetic polyneuropathy: Secondary | ICD-10-CM | POA: Diagnosis present

## 2013-11-11 DIAGNOSIS — Z6827 Body mass index (BMI) 27.0-27.9, adult: Secondary | ICD-10-CM | POA: Diagnosis not present

## 2013-11-11 DIAGNOSIS — M545 Low back pain, unspecified: Secondary | ICD-10-CM | POA: Diagnosis not present

## 2013-11-11 DIAGNOSIS — R079 Chest pain, unspecified: Secondary | ICD-10-CM | POA: Diagnosis not present

## 2013-11-11 DIAGNOSIS — M549 Dorsalgia, unspecified: Secondary | ICD-10-CM | POA: Diagnosis not present

## 2013-11-11 DIAGNOSIS — M48061 Spinal stenosis, lumbar region without neurogenic claudication: Secondary | ICD-10-CM | POA: Diagnosis not present

## 2013-11-11 DIAGNOSIS — E669 Obesity, unspecified: Secondary | ICD-10-CM | POA: Diagnosis present

## 2013-11-11 DIAGNOSIS — E785 Hyperlipidemia, unspecified: Secondary | ICD-10-CM | POA: Diagnosis present

## 2013-11-11 DIAGNOSIS — I209 Angina pectoris, unspecified: Secondary | ICD-10-CM | POA: Diagnosis present

## 2013-11-11 DIAGNOSIS — I10 Essential (primary) hypertension: Secondary | ICD-10-CM | POA: Diagnosis present

## 2013-11-11 DIAGNOSIS — E7800 Pure hypercholesterolemia, unspecified: Secondary | ICD-10-CM | POA: Diagnosis present

## 2013-11-11 DIAGNOSIS — M48062 Spinal stenosis, lumbar region with neurogenic claudication: Secondary | ICD-10-CM | POA: Diagnosis present

## 2013-11-11 DIAGNOSIS — G8929 Other chronic pain: Secondary | ICD-10-CM | POA: Diagnosis present

## 2013-11-11 DIAGNOSIS — Z7982 Long term (current) use of aspirin: Secondary | ICD-10-CM | POA: Diagnosis not present

## 2013-11-11 DIAGNOSIS — Z951 Presence of aortocoronary bypass graft: Secondary | ICD-10-CM | POA: Diagnosis not present

## 2013-11-11 DIAGNOSIS — Z4889 Encounter for other specified surgical aftercare: Secondary | ICD-10-CM | POA: Diagnosis not present

## 2013-11-11 DIAGNOSIS — Z9884 Bariatric surgery status: Secondary | ICD-10-CM | POA: Diagnosis not present

## 2013-11-11 DIAGNOSIS — E1149 Type 2 diabetes mellitus with other diabetic neurological complication: Secondary | ICD-10-CM | POA: Diagnosis present

## 2013-11-11 DIAGNOSIS — K5900 Constipation, unspecified: Secondary | ICD-10-CM | POA: Diagnosis present

## 2013-11-11 DIAGNOSIS — I2510 Atherosclerotic heart disease of native coronary artery without angina pectoris: Secondary | ICD-10-CM | POA: Diagnosis present

## 2013-11-11 DIAGNOSIS — R9431 Abnormal electrocardiogram [ECG] [EKG]: Secondary | ICD-10-CM | POA: Diagnosis not present

## 2013-11-18 DIAGNOSIS — M48061 Spinal stenosis, lumbar region without neurogenic claudication: Secondary | ICD-10-CM | POA: Diagnosis not present

## 2013-11-18 DIAGNOSIS — I2510 Atherosclerotic heart disease of native coronary artery without angina pectoris: Secondary | ICD-10-CM | POA: Diagnosis not present

## 2013-11-18 DIAGNOSIS — M5430 Sciatica, unspecified side: Secondary | ICD-10-CM | POA: Diagnosis not present

## 2013-11-18 DIAGNOSIS — M545 Low back pain, unspecified: Secondary | ICD-10-CM | POA: Diagnosis not present

## 2013-11-21 DIAGNOSIS — I10 Essential (primary) hypertension: Secondary | ICD-10-CM | POA: Diagnosis not present

## 2013-11-30 DIAGNOSIS — M48061 Spinal stenosis, lumbar region without neurogenic claudication: Secondary | ICD-10-CM | POA: Diagnosis not present

## 2013-11-30 DIAGNOSIS — K5900 Constipation, unspecified: Secondary | ICD-10-CM | POA: Diagnosis not present

## 2013-11-30 DIAGNOSIS — M545 Low back pain, unspecified: Secondary | ICD-10-CM | POA: Diagnosis not present

## 2013-11-30 DIAGNOSIS — M5430 Sciatica, unspecified side: Secondary | ICD-10-CM | POA: Diagnosis not present

## 2013-11-30 DIAGNOSIS — R110 Nausea: Secondary | ICD-10-CM | POA: Diagnosis not present

## 2013-12-06 DIAGNOSIS — M545 Low back pain, unspecified: Secondary | ICD-10-CM | POA: Diagnosis not present

## 2013-12-06 DIAGNOSIS — M48061 Spinal stenosis, lumbar region without neurogenic claudication: Secondary | ICD-10-CM | POA: Diagnosis not present

## 2013-12-06 DIAGNOSIS — R110 Nausea: Secondary | ICD-10-CM | POA: Diagnosis not present

## 2013-12-06 DIAGNOSIS — M5430 Sciatica, unspecified side: Secondary | ICD-10-CM | POA: Diagnosis not present

## 2013-12-06 DIAGNOSIS — K5900 Constipation, unspecified: Secondary | ICD-10-CM | POA: Diagnosis not present

## 2014-01-06 ENCOUNTER — Ambulatory Visit: Payer: Commercial Managed Care - PPO | Admitting: Internal Medicine

## 2014-02-10 DIAGNOSIS — H401210 Low-tension glaucoma, right eye, stage unspecified: Secondary | ICD-10-CM | POA: Diagnosis not present

## 2014-04-25 ENCOUNTER — Ambulatory Visit: Payer: Commercial Managed Care - PPO | Admitting: Internal Medicine

## 2014-04-27 DIAGNOSIS — M65312 Trigger thumb, left thumb: Secondary | ICD-10-CM | POA: Diagnosis not present

## 2014-05-01 DIAGNOSIS — M65312 Trigger thumb, left thumb: Secondary | ICD-10-CM | POA: Diagnosis not present

## 2014-05-01 DIAGNOSIS — Z886 Allergy status to analgesic agent status: Secondary | ICD-10-CM | POA: Diagnosis not present

## 2014-05-01 DIAGNOSIS — Z885 Allergy status to narcotic agent status: Secondary | ICD-10-CM | POA: Diagnosis not present

## 2014-05-01 DIAGNOSIS — Z01810 Encounter for preprocedural cardiovascular examination: Secondary | ICD-10-CM | POA: Diagnosis not present

## 2014-05-03 DIAGNOSIS — Z886 Allergy status to analgesic agent status: Secondary | ICD-10-CM | POA: Diagnosis not present

## 2014-05-03 DIAGNOSIS — Z885 Allergy status to narcotic agent status: Secondary | ICD-10-CM | POA: Diagnosis not present

## 2014-05-03 DIAGNOSIS — M65312 Trigger thumb, left thumb: Secondary | ICD-10-CM | POA: Diagnosis not present

## 2014-05-16 ENCOUNTER — Ambulatory Visit (INDEPENDENT_AMBULATORY_CARE_PROVIDER_SITE_OTHER): Payer: Commercial Managed Care - PPO | Admitting: Internal Medicine

## 2014-05-16 ENCOUNTER — Encounter: Payer: Self-pay | Admitting: Internal Medicine

## 2014-05-16 VITALS — BP 112/78 | HR 64 | Ht 63.5 in | Wt 164.4 lb

## 2014-05-16 DIAGNOSIS — E785 Hyperlipidemia, unspecified: Secondary | ICD-10-CM

## 2014-05-16 DIAGNOSIS — I25118 Atherosclerotic heart disease of native coronary artery with other forms of angina pectoris: Secondary | ICD-10-CM

## 2014-05-16 DIAGNOSIS — I10 Essential (primary) hypertension: Secondary | ICD-10-CM

## 2014-05-16 DIAGNOSIS — E119 Type 2 diabetes mellitus without complications: Secondary | ICD-10-CM

## 2014-05-16 DIAGNOSIS — Z951 Presence of aortocoronary bypass graft: Secondary | ICD-10-CM

## 2014-05-16 DIAGNOSIS — I208 Other forms of angina pectoris: Secondary | ICD-10-CM | POA: Diagnosis not present

## 2014-05-16 NOTE — Patient Instructions (Addendum)
Your physician wants you to follow-up in: 1 year with Dr.Hilty. You will receive a reminder letter in the mail two months in advance. If you don't receive a letter, please call our office to schedule the follow-up appointment.  Medication samples have been provided to the patient.  Drug name: Ranexa 1000  Qty: 56  LOT: [id number]  Exp.Date: 03/2017  Samples left at front desk for patient pick-up. Patient notified.  Julaine Fusilkins, Jenna M 11:22 AM 05/16/2014

## 2014-05-16 NOTE — Progress Notes (Signed)
OFFICE NOTE  Chief Complaint:  Routine followup  Primary Care Physician: Theodoro Kos, MD  HPI:  Tamara Nunez  is a 59 year old female with history of coronary disease. She had CABG in 2006, LIMA to LAD, saphenous vein graft to distal LAD. She had chest pain and some fatigue after that in 2009 and had a heart cath which showed widely patent grafts. She has continued to have atypical chest pain which worsens with positioning but seemed to improve with nitroglycerin spray. However, she was given isosorbide which she reports today has not helped her symptoms but reported it had helped her in the past. The main side effect is significant headache on isosorbide which is bothersome for her. She also takes Ranexa. She continues to have chest pain and heaviness mostly on her left side but is also a soreness. She says it is more sore when she pushes on the chest wall underneath the tail of the breast. She had a mammogram which currently was negative for any mass and, again, the symptoms do sound to be more musculoskeletal, possibly a radiculopathy either from the cervical root or perhaps a thoracic nerve that comes around the chest wall. She does have a history of reflux but has not had complaints regarding that and I had recommended a stress test to look for any reversible ischemia that could be causing her symptoms. The stress test performed on June 03, 2012, was low risk demonstrating no significant reversibility. The EKG did show some T-wave changes in 2, 3 and aVF and laterally V2 through V6 with Lexiscan that resolved post infusion. However, this is nonspecific.   She returns today with no specific complaints. She occasionally gets chest discomfort from time to time however the symptoms are well managed. She is complaining of some numbness feeling in her fingers and occasionally notes that there is some color change in her fingers in cold weather, which sounds like Raynaud's phenomenon.  I saw  Kimiya back in the office today. She tells that she's had 2 surgeries since I saw her last. In September she underwent back surgery and has had marked improvement in her pain. About 2-3 weeks ago she had surgery on her left thumb for a trigger finger. She recovered from both of these well. She continues to have minor anginal symptoms on and off which is about baseline for her in the past. She tells me that she's been more fatigued recently and was noted to have mild anemia on laboratory work performed for her surgeries. It was recommended that she go on iron but she has not started that. I've encouraged her to follow-up with her primary care physician for this.  PMHx:  Past Medical History  Diagnosis Date  . Coronary artery disease   . Diabetes mellitus without complication   . Hyperlipidemia   . Hypertension     Past Surgical History  Procedure Laterality Date  . Cardiac catheterization    . Gastric bypass  2009  . Coronary artery bypass graft  2006    2 vessel - LIMA to LAD (atretic), SVG to distal LAD  . Doppler echocardiography  04/12/2008    EF 60%  . Nm myoview ltd  06/03/2012    EF 71%  . Cardiac catheterization  01/13/2008  . Coronary artery bypass graft  12/23/2004    FAMHx:  Family History  Problem Relation Age of Onset  . Heart disease Mother   . Cancer - Lung Father  SOCHx:   reports that she has never smoked. She has never used smokeless tobacco. She reports that she does not drink alcohol or use illicit drugs.  ALLERGIES:  Allergies  Allergen Reactions  . Codeine Other (See Comments), Hives and Shortness Of Breath    UNSPECIFIED   . Ciprofloxacin Itching  . Valsartan Rash  . Adhesive [Tape] Itching    "clear tape" used after surgery. "took all her skin off"  . Simvastatin Other (See Comments)    UNSPECIFIED   . Sulfa Antibiotics     nausea    ROS: A comprehensive review of systems was negative except for: Constitutional: positive for  fatigue Neurological: positive for paresthesia  HOME MEDS: Current Outpatient Prescriptions  Medication Sig Dispense Refill  . albuterol (PROVENTIL HFA;VENTOLIN HFA) 108 (90 BASE) MCG/ACT inhaler Inhale 2 puffs into the lungs every 6 (six) hours as needed for wheezing.    Marland Kitchen. aspirin 325 MG EC tablet Take 325 mg by mouth daily.    . Calcium Carbonate-Vitamin D (CALCIUM 600 + D PO) Take by mouth 2 (two) times daily.    . cetirizine (ZYRTEC) 10 MG tablet Take 10 mg by mouth as needed for allergies.     . fluticasone (VERAMYST) 27.5 MCG/SPRAY nasal spray Place 2 sprays into the nose daily.    . furosemide (LASIX) 20 MG tablet Take 20 mg by mouth daily as needed for edema.     . isosorbide mononitrate (IMDUR) 30 MG 24 hr tablet Take 30 mg by mouth as needed (chest pain).     Marland Kitchen. lidocaine (LIDODERM) 5 % Place 1 patch onto the skin daily. Remove & Discard patch within 12 hours or as directed by MD    . metFORMIN (GLUCOPHAGE) 500 MG tablet Take 500 mg by mouth 2 (two) times daily with a meal.    . metoprolol tartrate (LOPRESSOR) 25 MG tablet Take 25 mg by mouth 2 (two) times daily.    . Multiple Vitamins-Minerals (MULTIVITAMIN PO) Take by mouth daily.    . nitroGLYCERIN (NITROLINGUAL) 0.4 MG/SPRAY spray Place 1 spray under the tongue every 5 (five) minutes as needed for chest pain.    . pantoprazole (PROTONIX) 40 MG tablet Take 40 mg by mouth daily.    . ramipril (ALTACE) 5 MG capsule Take 5 mg by mouth daily.    . ranolazine (RANEXA) 1000 MG SR tablet Take 1 tablet (1,000 mg total) by mouth 2 (two) times daily. 56 tablet 0  . simvastatin (ZOCOR) 20 MG tablet Take 20 mg by mouth at bedtime.    . vitamin B-12 (CYANOCOBALAMIN) 1000 MCG tablet Take 1,000 mcg by mouth daily.    . vitamin E 400 UNIT capsule Take 400 Units by mouth daily.     No current facility-administered medications for this visit.    LABS/IMAGING: No results found for this or any previous visit (from the past 48 hour(s)). No  results found.  VITALS: BP 112/78 mmHg  Pulse 64  Ht 5' 3.5" (1.613 m)  Wt 164 lb 6.4 oz (74.571 kg)  BMI 28.66 kg/m2  EXAM: General appearance: alert and no distress Neck: no adenopathy, no carotid bruit, no JVD, supple, symmetrical, trachea midline and thyroid not enlarged, symmetric, no tenderness/mass/nodules Lungs: clear to auscultation bilaterally Heart: regular rate and rhythm, S1, S2 normal, no murmur, click, rub or gallop Abdomen: soft, non-tender; bowel sounds normal; no masses,  no organomegaly Extremities: extremities normal, atraumatic, no cyanosis or edema Pulses: 2+ and symmetric Skin: Skin color,  texture, turgor normal. No rashes or lesions Neurologic: Grossly normal  EKG: Possible ectopic atrial rhythm at 64  ASSESSMENT: 1. Coronary artery disease status post CABG (LIMA to LAD-atretic, SVG to distal LAD) 2. Hypertension-controlled 3. Dyslipidemia-on statin 4. Stable angina 5. Diabetes type 2 6. Tingling and coolness of the fingers, concerning for Raynaud's phenomena  PLAN: 1.   Mrs. Brandi is doing well from a symptomatic standpoint. She occasionally gets some chest pain, but for the most part has stable angina. She had a stress test in 2014 which is negative for ischemia. Her EKG today shows a minor change in that there is probably an ectopic atrial P wave. It does not likely have any consequences. I do not think that her fatigue is related to this. She complains of some stable angina which is unchanged. She recently had major back surgery and tolerated it without any difficulty. Blood pressure is well controlled. She was told that she had some mild anemia and that might benefit from taking iron. I think she should further discuss this with her primary care provider and probably have formal iron studies and further workup as to what the cause of her anemia may be.  Otherwise we'll plan to see her back annually or sooner as necessary.  Chrystie Nose, MD,  [facility] Attending Cardiologist [facility]  HILTY,Kenneth C 05/16/2014, 12:47 PM

## 2014-05-22 NOTE — Addendum Note (Signed)
Addended by: Abram SanderSIGMON, Brendyn Mclaren J on: 05/22/2014 04:19 PM   Modules accepted: Orders

## 2014-08-12 DIAGNOSIS — H40023 Open angle with borderline findings, high risk, bilateral: Secondary | ICD-10-CM | POA: Diagnosis not present

## 2014-08-14 DIAGNOSIS — E780 Pure hypercholesterolemia: Secondary | ICD-10-CM | POA: Diagnosis not present

## 2014-08-14 DIAGNOSIS — R1030 Lower abdominal pain, unspecified: Secondary | ICD-10-CM | POA: Diagnosis not present

## 2014-08-14 DIAGNOSIS — Z86010 Personal history of colonic polyps: Secondary | ICD-10-CM | POA: Diagnosis not present

## 2014-08-14 DIAGNOSIS — K625 Hemorrhage of anus and rectum: Secondary | ICD-10-CM | POA: Diagnosis not present

## 2014-08-28 DIAGNOSIS — R1030 Lower abdominal pain, unspecified: Secondary | ICD-10-CM | POA: Diagnosis not present

## 2014-09-02 DIAGNOSIS — M545 Low back pain: Secondary | ICD-10-CM | POA: Diagnosis not present

## 2014-09-28 DIAGNOSIS — M5416 Radiculopathy, lumbar region: Secondary | ICD-10-CM | POA: Diagnosis not present

## 2014-10-05 ENCOUNTER — Telehealth: Payer: Self-pay | Admitting: Internal Medicine

## 2014-10-05 DIAGNOSIS — M5416 Radiculopathy, lumbar region: Secondary | ICD-10-CM | POA: Diagnosis not present

## 2014-10-05 DIAGNOSIS — Z981 Arthrodesis status: Secondary | ICD-10-CM | POA: Diagnosis not present

## 2014-10-05 DIAGNOSIS — E1149 Type 2 diabetes mellitus with other diabetic neurological complication: Secondary | ICD-10-CM | POA: Diagnosis not present

## 2014-10-05 DIAGNOSIS — M545 Low back pain: Secondary | ICD-10-CM | POA: Diagnosis not present

## 2014-10-05 MED ORDER — NITROGLYCERIN 0.4 MG/SPRAY TL SOLN: 1.0000 | Status: DC | PRN

## 2014-10-05 NOTE — Telephone Encounter (Signed)
°  1. Which medications need to be refilled? Nitrolingual spray   2. Which pharmacy is medication to be sent to?[facility] in [place]   3. Do they need a 30 day or 90 day supply? N/A  4. Would they like a call back once the medication has been sent to the pharmacy? Yes

## 2014-10-05 NOTE — Telephone Encounter (Signed)
Electronic refill for NTG spray done.  Patient notified.

## 2014-10-06 ENCOUNTER — Telehealth: Payer: Self-pay | Admitting: Internal Medicine

## 2014-10-06 MED ORDER — NITROGLYCERIN 0.4 MG/SPRAY TL SOLN: 1.0000 | Status: DC | PRN

## 2014-10-06 NOTE — Telephone Encounter (Signed)
Spoke with patient regarding chest pain. She report she has been having pain since Wednesday evening and the pain has subsided today. She reports she used a total of 6 sprays of NTG for relief. She reports left arm pain associated with chest pain. She describes the pain as is she was "having a heart attack". Asked why patient did not seek emergency evaluation of her symptoms given her cardiac history and the way she described her pain and she did not have a reason. She reports she is feeling better. Informed patient it may be a good idea to schedule her with a cardiology practitioner and she seemed reluctant to want to come in for an appointment. She was advised to monitor her symptoms and should her pain recur and she take NTG with relief, she should seek emergency eval.   Rx(s) sent to pharmacy electronically.

## 2014-10-06 NOTE — Telephone Encounter (Signed)
°  1. Which medications need to be refilled? Nitroglycerin Spray-had Angina yesterday and last night 2. Which pharmacy is medication to be sent to?[phone]  3. Do they need a 30 day or 90 day supply?1 bottle  4. Would they like a call back once the medication has been sent to the pharmacy? yes

## 2014-10-16 DIAGNOSIS — H40013 Open angle with borderline findings, low risk, bilateral: Secondary | ICD-10-CM | POA: Diagnosis not present

## 2014-10-16 DIAGNOSIS — E119 Type 2 diabetes mellitus without complications: Secondary | ICD-10-CM | POA: Diagnosis not present

## 2014-10-16 DIAGNOSIS — H43393 Other vitreous opacities, bilateral: Secondary | ICD-10-CM | POA: Diagnosis not present

## 2014-10-16 DIAGNOSIS — H2513 Age-related nuclear cataract, bilateral: Secondary | ICD-10-CM | POA: Diagnosis not present

## 2014-10-17 NOTE — Telephone Encounter (Signed)
Thanks

## 2015-06-03 DIAGNOSIS — I110 Hypertensive heart disease with heart failure: Secondary | ICD-10-CM | POA: Diagnosis not present

## 2015-06-03 DIAGNOSIS — E119 Type 2 diabetes mellitus without complications: Secondary | ICD-10-CM | POA: Diagnosis not present

## 2015-06-03 DIAGNOSIS — K5660 Unspecified intestinal obstruction: Secondary | ICD-10-CM | POA: Diagnosis not present

## 2015-06-03 DIAGNOSIS — Z9884 Bariatric surgery status: Secondary | ICD-10-CM | POA: Diagnosis not present

## 2015-06-03 DIAGNOSIS — Z79899 Other long term (current) drug therapy: Secondary | ICD-10-CM | POA: Diagnosis not present

## 2015-06-03 DIAGNOSIS — Z7984 Long term (current) use of oral hypoglycemic drugs: Secondary | ICD-10-CM | POA: Diagnosis not present

## 2015-06-03 DIAGNOSIS — I4510 Unspecified right bundle-branch block: Secondary | ICD-10-CM | POA: Diagnosis not present

## 2015-06-03 DIAGNOSIS — I509 Heart failure, unspecified: Secondary | ICD-10-CM | POA: Diagnosis not present

## 2015-06-04 DIAGNOSIS — K460 Unspecified abdominal hernia with obstruction, without gangrene: Secondary | ICD-10-CM | POA: Diagnosis not present

## 2015-06-04 DIAGNOSIS — Z79899 Other long term (current) drug therapy: Secondary | ICD-10-CM | POA: Diagnosis not present

## 2015-06-04 DIAGNOSIS — Z7951 Long term (current) use of inhaled steroids: Secondary | ICD-10-CM | POA: Diagnosis not present

## 2015-06-04 DIAGNOSIS — I509 Heart failure, unspecified: Secondary | ICD-10-CM | POA: Diagnosis not present

## 2015-06-04 DIAGNOSIS — Z86010 Personal history of colonic polyps: Secondary | ICD-10-CM | POA: Diagnosis not present

## 2015-06-04 DIAGNOSIS — J45909 Unspecified asthma, uncomplicated: Secondary | ICD-10-CM | POA: Diagnosis present

## 2015-06-04 DIAGNOSIS — Z951 Presence of aortocoronary bypass graft: Secondary | ICD-10-CM | POA: Diagnosis not present

## 2015-06-04 DIAGNOSIS — Z90710 Acquired absence of both cervix and uterus: Secondary | ICD-10-CM | POA: Diagnosis not present

## 2015-06-04 DIAGNOSIS — K450 Other specified abdominal hernia with obstruction, without gangrene: Secondary | ICD-10-CM | POA: Diagnosis not present

## 2015-06-04 DIAGNOSIS — Z7984 Long term (current) use of oral hypoglycemic drugs: Secondary | ICD-10-CM | POA: Diagnosis not present

## 2015-06-04 DIAGNOSIS — M797 Fibromyalgia: Secondary | ICD-10-CM | POA: Diagnosis present

## 2015-06-04 DIAGNOSIS — K5669 Other intestinal obstruction: Secondary | ICD-10-CM | POA: Diagnosis not present

## 2015-06-04 DIAGNOSIS — Z881 Allergy status to other antibiotic agents status: Secondary | ICD-10-CM | POA: Diagnosis not present

## 2015-06-04 DIAGNOSIS — Z888 Allergy status to other drugs, medicaments and biological substances status: Secondary | ICD-10-CM | POA: Diagnosis not present

## 2015-06-04 DIAGNOSIS — Z882 Allergy status to sulfonamides status: Secondary | ICD-10-CM | POA: Diagnosis not present

## 2015-06-04 DIAGNOSIS — M4806 Spinal stenosis, lumbar region: Secondary | ICD-10-CM | POA: Diagnosis present

## 2015-06-04 DIAGNOSIS — G8929 Other chronic pain: Secondary | ICD-10-CM | POA: Diagnosis present

## 2015-06-04 DIAGNOSIS — I2510 Atherosclerotic heart disease of native coronary artery without angina pectoris: Secondary | ICD-10-CM | POA: Diagnosis not present

## 2015-06-04 DIAGNOSIS — Z885 Allergy status to narcotic agent status: Secondary | ICD-10-CM | POA: Diagnosis not present

## 2015-06-04 DIAGNOSIS — I898 Other specified noninfective disorders of lymphatic vessels and lymph nodes: Secondary | ICD-10-CM | POA: Diagnosis present

## 2015-06-04 DIAGNOSIS — I10 Essential (primary) hypertension: Secondary | ICD-10-CM | POA: Diagnosis present

## 2015-06-04 DIAGNOSIS — K219 Gastro-esophageal reflux disease without esophagitis: Secondary | ICD-10-CM | POA: Diagnosis present

## 2015-06-04 DIAGNOSIS — E119 Type 2 diabetes mellitus without complications: Secondary | ICD-10-CM | POA: Diagnosis not present

## 2015-06-04 DIAGNOSIS — K458 Other specified abdominal hernia without obstruction or gangrene: Secondary | ICD-10-CM | POA: Diagnosis not present

## 2015-06-04 DIAGNOSIS — K5660 Unspecified intestinal obstruction: Secondary | ICD-10-CM | POA: Diagnosis not present

## 2015-06-04 DIAGNOSIS — I110 Hypertensive heart disease with heart failure: Secondary | ICD-10-CM | POA: Diagnosis not present

## 2015-06-04 DIAGNOSIS — Z9884 Bariatric surgery status: Secondary | ICD-10-CM | POA: Diagnosis not present

## 2015-06-04 DIAGNOSIS — E785 Hyperlipidemia, unspecified: Secondary | ICD-10-CM | POA: Diagnosis not present

## 2015-06-08 DIAGNOSIS — Z885 Allergy status to narcotic agent status: Secondary | ICD-10-CM | POA: Diagnosis not present

## 2015-06-08 DIAGNOSIS — E119 Type 2 diabetes mellitus without complications: Secondary | ICD-10-CM | POA: Diagnosis not present

## 2015-06-08 DIAGNOSIS — Z90710 Acquired absence of both cervix and uterus: Secondary | ICD-10-CM | POA: Diagnosis not present

## 2015-06-08 DIAGNOSIS — I2510 Atherosclerotic heart disease of native coronary artery without angina pectoris: Secondary | ICD-10-CM | POA: Diagnosis not present

## 2015-06-08 DIAGNOSIS — Z881 Allergy status to other antibiotic agents status: Secondary | ICD-10-CM | POA: Diagnosis not present

## 2015-06-08 DIAGNOSIS — R5381 Other malaise: Secondary | ICD-10-CM | POA: Diagnosis not present

## 2015-06-08 DIAGNOSIS — Z9889 Other specified postprocedural states: Secondary | ICD-10-CM | POA: Diagnosis not present

## 2015-06-08 DIAGNOSIS — I10 Essential (primary) hypertension: Secondary | ICD-10-CM | POA: Diagnosis not present

## 2015-06-08 DIAGNOSIS — Z951 Presence of aortocoronary bypass graft: Secondary | ICD-10-CM | POA: Diagnosis not present

## 2015-06-08 DIAGNOSIS — R1084 Generalized abdominal pain: Secondary | ICD-10-CM | POA: Diagnosis not present

## 2015-06-08 DIAGNOSIS — K5900 Constipation, unspecified: Secondary | ICD-10-CM | POA: Diagnosis not present

## 2015-06-08 DIAGNOSIS — Z882 Allergy status to sulfonamides status: Secondary | ICD-10-CM | POA: Diagnosis not present

## 2015-06-08 DIAGNOSIS — K219 Gastro-esophageal reflux disease without esophagitis: Secondary | ICD-10-CM | POA: Diagnosis not present

## 2015-06-08 DIAGNOSIS — Z9884 Bariatric surgery status: Secondary | ICD-10-CM | POA: Diagnosis not present

## 2015-06-08 DIAGNOSIS — K55069 Acute infarction of intestine, part and extent unspecified: Secondary | ICD-10-CM | POA: Diagnosis not present

## 2015-06-08 DIAGNOSIS — Z7982 Long term (current) use of aspirin: Secondary | ICD-10-CM | POA: Diagnosis not present

## 2015-06-08 DIAGNOSIS — Z79899 Other long term (current) drug therapy: Secondary | ICD-10-CM | POA: Diagnosis not present

## 2015-06-08 DIAGNOSIS — E7800 Pure hypercholesterolemia, unspecified: Secondary | ICD-10-CM | POA: Diagnosis not present

## 2015-06-08 DIAGNOSIS — R109 Unspecified abdominal pain: Secondary | ICD-10-CM | POA: Diagnosis not present

## 2015-06-08 DIAGNOSIS — Z888 Allergy status to other drugs, medicaments and biological substances status: Secondary | ICD-10-CM | POA: Diagnosis not present

## 2015-06-08 DIAGNOSIS — I25119 Atherosclerotic heart disease of native coronary artery with unspecified angina pectoris: Secondary | ICD-10-CM | POA: Diagnosis not present

## 2015-06-08 DIAGNOSIS — R58 Hemorrhage, not elsewhere classified: Secondary | ICD-10-CM | POA: Diagnosis not present

## 2015-06-09 DIAGNOSIS — I10 Essential (primary) hypertension: Secondary | ICD-10-CM | POA: Diagnosis not present

## 2015-06-09 DIAGNOSIS — K5900 Constipation, unspecified: Secondary | ICD-10-CM | POA: Diagnosis not present

## 2015-06-09 DIAGNOSIS — I2510 Atherosclerotic heart disease of native coronary artery without angina pectoris: Secondary | ICD-10-CM | POA: Diagnosis not present

## 2015-06-09 DIAGNOSIS — K55069 Acute infarction of intestine, part and extent unspecified: Secondary | ICD-10-CM | POA: Diagnosis not present

## 2015-06-09 DIAGNOSIS — E119 Type 2 diabetes mellitus without complications: Secondary | ICD-10-CM | POA: Diagnosis not present

## 2015-06-11 ENCOUNTER — Ambulatory Visit (INDEPENDENT_AMBULATORY_CARE_PROVIDER_SITE_OTHER): Payer: Commercial Managed Care - PPO | Admitting: Internal Medicine

## 2015-06-11 ENCOUNTER — Encounter: Payer: Self-pay | Admitting: Internal Medicine

## 2015-06-11 VITALS — BP 140/76 | HR 57 | Ht 60.0 in | Wt 172.8 lb

## 2015-06-11 DIAGNOSIS — I10 Essential (primary) hypertension: Secondary | ICD-10-CM | POA: Diagnosis not present

## 2015-06-11 DIAGNOSIS — I2510 Atherosclerotic heart disease of native coronary artery without angina pectoris: Secondary | ICD-10-CM

## 2015-06-11 DIAGNOSIS — E785 Hyperlipidemia, unspecified: Secondary | ICD-10-CM

## 2015-06-11 DIAGNOSIS — Z951 Presence of aortocoronary bypass graft: Secondary | ICD-10-CM

## 2015-06-11 NOTE — Patient Instructions (Addendum)
Your physician has requested that you have a lexiscan myoview. For further information please visit [email or web address]. Please follow instruction sheet, as given.   Fax labs to  Dr. Rennis GoldenHilty @ [phone].  Your physician recommends that you schedule a follow-up appointment pending test results.

## 2015-06-11 NOTE — Progress Notes (Signed)
OFFICE NOTE  Chief Complaint:  Routine followup  Primary Care Physician: Theodoro Kos, MD  HPI:  Tamara Nunez  is a 60 year old female with history of coronary disease. She had CABG in 2006, LIMA to LAD, saphenous vein graft to distal LAD. She had chest pain and some fatigue after that in 2009 and had a heart cath which showed widely patent grafts. She has continued to have atypical chest pain which worsens with positioning but seemed to improve with nitroglycerin spray. However, she was given isosorbide which she reports today has not helped her symptoms but reported it had helped her in the past. The main side effect is significant headache on isosorbide which is bothersome for her. She also takes Ranexa. She continues to have chest pain and heaviness mostly on her left side but is also a soreness. She says it is more sore when she pushes on the chest wall underneath the tail of the breast. She had a mammogram which currently was negative for any mass and, again, the symptoms do sound to be more musculoskeletal, possibly a radiculopathy either from the cervical root or perhaps a thoracic nerve that comes around the chest wall. She does have a history of reflux but has not had complaints regarding that and I had recommended a stress test to look for any reversible ischemia that could be causing her symptoms. The stress test performed on June 03, 2012, was low risk demonstrating no significant reversibility. The EKG did show some T-wave changes in 2, 3 and aVF and laterally V2 through V6 with Lexiscan that resolved post infusion. However, this is nonspecific.   She returns today with no specific complaints. She occasionally gets chest discomfort from time to time however the symptoms are well managed. She is complaining of some numbness feeling in her fingers and occasionally notes that there is some color change in her fingers in cold weather, which sounds like Raynaud's phenomenon.  I saw  Daren back in the office today. She tells that she's had 2 surgeries since I saw her last. In September she underwent back surgery and has had marked improvement in her pain. About 2-3 weeks ago she had surgery on her left thumb for a trigger finger. She recovered from both of these well. She continues to have minor anginal symptoms on and off which is about baseline for her in the past. She tells me that she's been more fatigued recently and was noted to have mild anemia on laboratory work performed for her surgeries. It was recommended that she go on iron but she has not started that. I've encouraged her to follow-up with her primary care physician for this.  Today for follow-up. She was last seen over a year ago and has recently been ill. She was just discharged from [facility] a few days ago after she developed small bowel obstruction and what sounds a Perforated diverticulum. She underwent exploratory laparotomy and was hospitalized for a period of time. It does not sound like she had any cardiac issues during that procedure. She does have some chronic fatigue complaints and reports she is very fatigued today. She says it's been has been getting progressively worse she does not think is related to her abdominal infection.  PMHx:  Past Medical History  Diagnosis Date  . Coronary artery disease   . Diabetes mellitus without complication (HCC)   . Hyperlipidemia   . Hypertension     Past Surgical History  Procedure Laterality Date  .  Cardiac catheterization    . Gastric bypass  2009  . Coronary artery bypass graft  2006    2 vessel - LIMA to LAD (atretic), SVG to distal LAD  . Doppler echocardiography  04/12/2008    EF 60%  . Nm myoview ltd  06/03/2012    EF 71%  . Cardiac catheterization  01/13/2008  . Coronary artery bypass graft  12/23/2004    FAMHx:  Family History  Problem Relation Age of Onset  . Heart disease Mother   . Cancer - Lung Father     SOCHx:   reports  that she has never smoked. She has never used smokeless tobacco. She reports that she does not drink alcohol or use illicit drugs.  ALLERGIES:  Allergies  Allergen Reactions  . Codeine Other (See Comments), Hives and Shortness Of Breath    UNSPECIFIED   . Ciprofloxacin Itching  . Valsartan Rash  . Adhesive [Tape] Itching    "clear tape" used after surgery. "took all her skin off"  . Simvastatin Other (See Comments)    UNSPECIFIED   . Sulfa Antibiotics     nausea    ROS: A comprehensive review of systems was negative except for: Constitutional: positive for fatigue Neurological: positive for paresthesia  HOME MEDS: Current Outpatient Prescriptions  Medication Sig Dispense Refill  . albuterol (PROVENTIL HFA;VENTOLIN HFA) 108 (90 BASE) MCG/ACT inhaler Inhale 2 puffs into the lungs every 6 (six) hours as needed for wheezing.    Marland Kitchen aspirin 325 MG EC tablet Take 325 mg by mouth daily.    . Calcium Carbonate-Vitamin D (CALCIUM 600 + D PO) Take by mouth 2 (two) times daily.    . cetirizine (ZYRTEC) 10 MG tablet Take 10 mg by mouth as needed for allergies.     . fluticasone (VERAMYST) 27.5 MCG/SPRAY nasal spray Place 2 sprays into the nose daily.    . furosemide (LASIX) 20 MG tablet Take 20 mg by mouth daily as needed for edema.     . isosorbide mononitrate (IMDUR) 30 MG 24 hr tablet Take 30 mg by mouth as needed (chest pain).     Marland Kitchen lidocaine (LIDODERM) 5 % Place 1 patch onto the skin daily. Remove & Discard patch within 12 hours or as directed by MD    . metFORMIN (GLUCOPHAGE) 500 MG tablet Take 500 mg by mouth 2 (two) times daily with a meal.    . metoprolol tartrate (LOPRESSOR) 25 MG tablet Take 25 mg by mouth 2 (two) times daily.    . Multiple Vitamins-Minerals (MULTIVITAMIN PO) Take by mouth daily.    . nitroGLYCERIN (NITROLINGUAL) 0.4 MG/SPRAY spray Place 1 spray under the tongue every 5 (five) minutes as needed for chest pain. 12 g 1  . pantoprazole (PROTONIX) 40 MG tablet Take 40  mg by mouth daily.    . ramipril (ALTACE) 5 MG capsule Take 5 mg by mouth daily.    . ranolazine (RANEXA) 1000 MG SR tablet Take 1 tablet (1,000 mg total) by mouth 2 (two) times daily. 56 tablet 0  . simvastatin (ZOCOR) 20 MG tablet Take 20 mg by mouth at bedtime.    . vitamin B-12 (CYANOCOBALAMIN) 1000 MCG tablet Take 1,000 mcg by mouth daily.    . vitamin E 400 UNIT capsule Take 400 Units by mouth daily.     No current facility-administered medications for this visit.    LABS/IMAGING: No results found for this or any previous visit (from the past 48 hour(s)). No results  found.  VITALS: BP 140/76 mmHg  Pulse 57  Ht 5' (1.524 m)  Wt 172 lb 12.8 oz (78.382 kg)  BMI 33.75 kg/m2  EXAM: General appearance: alert and no distress Neck: no adenopathy, no carotid bruit, no JVD, supple, symmetrical, trachea midline and thyroid not enlarged, symmetric, no tenderness/mass/nodules Lungs: clear to auscultation bilaterally Heart: regular rate and rhythm, S1, S2 normal, no murmur, click, rub or gallop Abdomen: Postsurgical, diffusely tender Extremities: extremities normal, atraumatic, no cyanosis or edema Pulses: 2+ and symmetric Skin: Skin color, texture, turgor normal. No rashes or lesions Neurologic: Grossly normal  EKG: Sinus bradycardia 57, RBBB  ASSESSMENT: 1. Progressive fatigue and new RBBB 2. Coronary artery disease status post CABG (LIMA to LAD-atretic, SVG to distal LAD) 3. Hypertension-controlled 4. Dyslipidemia-on statin 5. Stable angina 6. Diabetes type 2 7. Tingling and coolness of the fingers, concerning for Raynaud's phenomena 8. Recent small bowel obstruction and status post exploratory laparotomy  PLAN: 1.   Mrs. Apolinar JunesBrandon is recently status post exploratory laparotomy and reported some complications with small bowel obstruction. Accordingly she's been fatigue but she says this was starting even before her surgery. Likely due to intra-abdominal infection. She does have  new findings of a right bundle-branch block and EKG. She thinks that her fatigue and symptoms may be due to worsening coronary disease. She denies any chest pain does get some shortness of breath. Her bypass was in 2006 and last stress test was in 2014. We'll plan a repeat Lexiscan nuclear stress test once she's recovered from her recent surgery in about 2-3 weeks. Fortunately, it sounds like she got through surgery without any cardiac problems that we are aware of.  Chrystie NoseKenneth C. Sorren Vallier, MD, [facility] Attending Cardiologist [facility]  Chrystie NoseKenneth C Bader Stubblefield 06/11/2015, 1:07 PM

## 2015-06-14 NOTE — Addendum Note (Signed)
Addended by: Evans LanceSTOVER, Jayden Kratochvil W on: 06/14/2015 05:15 PM   Modules accepted: Orders

## 2015-07-09 ENCOUNTER — Telehealth: Payer: Self-pay | Admitting: Internal Medicine

## 2015-07-09 DIAGNOSIS — M5416 Radiculopathy, lumbar region: Secondary | ICD-10-CM | POA: Diagnosis not present

## 2015-07-09 DIAGNOSIS — M4806 Spinal stenosis, lumbar region: Secondary | ICD-10-CM | POA: Diagnosis not present

## 2015-07-09 DIAGNOSIS — Z6828 Body mass index (BMI) 28.0-28.9, adult: Secondary | ICD-10-CM | POA: Diagnosis not present

## 2015-07-09 DIAGNOSIS — S32009K Unspecified fracture of unspecified lumbar vertebra, subsequent encounter for fracture with nonunion: Secondary | ICD-10-CM | POA: Diagnosis not present

## 2015-07-09 DIAGNOSIS — M545 Low back pain: Secondary | ICD-10-CM | POA: Diagnosis not present

## 2015-07-09 NOTE — Telephone Encounter (Signed)
Request for surgical clearance:    What type of surgery is being performed?  myelogram   When is this surgery scheduled?  To be scheduled.   Are there any medications that need to be held prior to surgery and how long?  Pt on aspirin.    Name of physician performing surgery?    Dr Maeola HarmanJoseph Stern   5. What is your office phone and fax number? Office number [phone];  Fax number [phone].

## 2015-07-09 NOTE — Telephone Encounter (Signed)
Pt called in stating she will need to have a mylogram done and she needs to be cleared by Dr. Rennis GoldenHilty first before having this done. She says that if she is cleared to have this done to please fax this to Dr. Fredrich BirksStern's office at [phone]. Please f/u with her   Thanks

## 2015-07-10 NOTE — Telephone Encounter (Signed)
Called pt back and gave Dr Blanchie DessertHilty's recommendation to have lexiscan scheduled before she can proceed. Message sent to scheduling. Pt verbalized understanding.

## 2015-07-10 NOTE — Telephone Encounter (Signed)
I recommended a lexiscan myoview at my last office visit. Based on her symptoms, she would need this to evaluate her risk prior to her myelogram procedure. Please advise her to schedule this.  Dr. HRexene Edison

## 2015-07-11 ENCOUNTER — Telehealth (HOSPITAL_COMMUNITY): Payer: Self-pay | Admitting: *Deleted

## 2015-07-11 NOTE — Telephone Encounter (Signed)
Left message for patient to call and schedule Lexiscan orderedy by Dr. Rennis GoldenHilty

## 2015-07-12 NOTE — Telephone Encounter (Signed)
[facility] Dr. Venetia MaxonStern as Lorain ChildesFYI of pending clearance.

## 2015-07-12 NOTE — Telephone Encounter (Signed)
lexiscan scheduled 08/03/15

## 2015-07-30 DIAGNOSIS — R1084 Generalized abdominal pain: Secondary | ICD-10-CM | POA: Diagnosis not present

## 2015-08-01 ENCOUNTER — Telehealth (HOSPITAL_COMMUNITY): Payer: Self-pay

## 2015-08-01 NOTE — Telephone Encounter (Signed)
Encounter complete. 

## 2015-08-03 ENCOUNTER — Ambulatory Visit (HOSPITAL_COMMUNITY)
Admission: RE | Admit: 2015-08-03 | Discharge: 2015-08-03 | Disposition: A | Discharge status: Home / Self Care | Payer: Commercial Managed Care - PPO | Source: Ambulatory Visit | Attending: Cardiovascular Disease | Admitting: Cardiovascular Disease

## 2015-08-03 DIAGNOSIS — E119 Type 2 diabetes mellitus without complications: Secondary | ICD-10-CM | POA: Diagnosis not present

## 2015-08-03 DIAGNOSIS — Z951 Presence of aortocoronary bypass graft: Secondary | ICD-10-CM

## 2015-08-03 DIAGNOSIS — R5383 Other fatigue: Secondary | ICD-10-CM | POA: Diagnosis not present

## 2015-08-03 DIAGNOSIS — Z8249 Family history of ischemic heart disease and other diseases of the circulatory system: Secondary | ICD-10-CM | POA: Insufficient documentation

## 2015-08-03 DIAGNOSIS — I10 Essential (primary) hypertension: Secondary | ICD-10-CM | POA: Insufficient documentation

## 2015-08-03 DIAGNOSIS — R42 Dizziness and giddiness: Secondary | ICD-10-CM | POA: Insufficient documentation

## 2015-08-03 DIAGNOSIS — R079 Chest pain, unspecified: Secondary | ICD-10-CM | POA: Diagnosis not present

## 2015-08-03 DIAGNOSIS — I2510 Atherosclerotic heart disease of native coronary artery without angina pectoris: Secondary | ICD-10-CM | POA: Diagnosis not present

## 2015-08-03 DIAGNOSIS — I4510 Unspecified right bundle-branch block: Secondary | ICD-10-CM | POA: Diagnosis not present

## 2015-08-03 DIAGNOSIS — R0609 Other forms of dyspnea: Secondary | ICD-10-CM | POA: Diagnosis not present

## 2015-08-03 LAB — MYOCARDIAL PERFUSION IMAGING
LV dias vol: 81 mL (ref 46–106)
LV sys vol: 37 mL
Peak HR: 97 {beats}/min
Rest HR: 60 {beats}/min
SDS: 0
SRS: 0
SSS: 0
TID: 1.17

## 2015-08-03 MED ORDER — TECHNETIUM TC 99M TETROFOSMIN IV KIT
30.2000 | PACK | Freq: Once | INTRAVENOUS | Status: AC | PRN
  Administered 2015-08-03: 30.2 via INTRAVENOUS
  Filled 2015-08-03: qty 30

## 2015-08-03 MED ORDER — REGADENOSON 0.4 MG/5ML IV SOLN
0.4000 mg | Freq: Once | INTRAVENOUS | Status: AC
  Administered 2015-08-03: 0.4 mg via INTRAVENOUS

## 2015-08-03 MED ORDER — TECHNETIUM TC 99M TETROFOSMIN IV KIT
10.3000 | PACK | Freq: Once | INTRAVENOUS | Status: AC | PRN
  Administered 2015-08-03: 10 via INTRAVENOUS
  Filled 2015-08-03: qty 10

## 2015-11-07 ENCOUNTER — Other Ambulatory Visit (HOSPITAL_COMMUNITY): Payer: Self-pay | Admitting: Neurosurgery

## 2015-11-07 ENCOUNTER — Other Ambulatory Visit: Payer: Self-pay | Admitting: Neurosurgery

## 2015-11-07 DIAGNOSIS — M5416 Radiculopathy, lumbar region: Secondary | ICD-10-CM

## 2015-11-22 ENCOUNTER — Ambulatory Visit (HOSPITAL_COMMUNITY)
Admission: RE | Admit: 2015-11-22 | Discharge: 2015-11-22 | Disposition: A | Discharge status: Home / Self Care | Payer: Commercial Managed Care - PPO | Source: Ambulatory Visit | Attending: Neurosurgery | Admitting: Neurosurgery

## 2015-11-22 ENCOUNTER — Other Ambulatory Visit (HOSPITAL_COMMUNITY): Payer: Self-pay | Admitting: Neurosurgery

## 2015-11-22 DIAGNOSIS — M5126 Other intervertebral disc displacement, lumbar region: Secondary | ICD-10-CM | POA: Diagnosis not present

## 2015-11-22 DIAGNOSIS — Z981 Arthrodesis status: Secondary | ICD-10-CM | POA: Diagnosis not present

## 2015-11-22 DIAGNOSIS — M5416 Radiculopathy, lumbar region: Secondary | ICD-10-CM

## 2015-11-22 DIAGNOSIS — I700 Atherosclerosis of aorta: Secondary | ICD-10-CM | POA: Diagnosis not present

## 2015-11-22 DIAGNOSIS — Z9889 Other specified postprocedural states: Secondary | ICD-10-CM | POA: Diagnosis not present

## 2015-11-22 DIAGNOSIS — M4806 Spinal stenosis, lumbar region: Secondary | ICD-10-CM | POA: Insufficient documentation

## 2015-11-22 LAB — GLUCOSE, CAPILLARY
Glucose-Capillary: 76 mg/dL (ref 65–99)
Glucose-Capillary: 59 mg/dL — ABNORMAL LOW (ref 65–99)
Glucose-Capillary: 157 mg/dL — ABNORMAL HIGH (ref 65–99)

## 2015-11-22 MED ORDER — IOPAMIDOL (ISOVUE-M 200) INJECTION 41%
INTRAMUSCULAR | Status: AC
  Administered 2015-11-22: 20 mL via INTRATHECAL
  Filled 2015-11-22: qty 10

## 2015-11-22 MED ORDER — ONDANSETRON HCL 4 MG PO TABS
4.0000 mg | ORAL_TABLET | Freq: Once | ORAL | Status: AC
  Administered 2015-11-22: 4 mg via ORAL

## 2015-11-22 MED ORDER — ONDANSETRON HCL 4 MG PO TABS
ORAL_TABLET | ORAL | Status: DC
Start: 2015-11-22 — End: 2015-11-23
  Filled 2015-11-22: qty 1

## 2015-11-22 MED ORDER — DIAZEPAM 5 MG PO TABS
10.0000 mg | ORAL_TABLET | Freq: Once | ORAL | Status: AC
  Administered 2015-11-22: 10 mg via ORAL

## 2015-11-22 MED ORDER — ONDANSETRON HCL 4 MG/2ML IJ SOLN: 4.0000 mg | Freq: Four times a day (QID) | INTRAMUSCULAR | Status: DC | PRN

## 2015-11-22 MED ORDER — LIDOCAINE HCL 1 % IJ SOLN
INTRAMUSCULAR | Status: AC
  Filled 2015-11-22: qty 10

## 2015-11-22 MED ORDER — DIAZEPAM 5 MG PO TABS
ORAL_TABLET | ORAL | Status: AC
  Filled 2015-11-22: qty 2

## 2015-11-22 MED ORDER — LIDOCAINE HCL (PF) 1 % IJ SOLN
10.0000 mL | Freq: Once | INTRAMUSCULAR | Status: AC
  Administered 2015-11-22: 10 mL via INTRADERMAL

## 2015-11-22 MED ORDER — OXYCODONE-ACETAMINOPHEN 5-325 MG PO TABS
1.0000 | ORAL_TABLET | ORAL | Status: DC | PRN
  Administered 2015-11-22: 1 via ORAL

## 2015-11-22 MED ORDER — OXYCODONE-ACETAMINOPHEN 5-325 MG PO TABS
ORAL_TABLET | ORAL | Status: AC
  Filled 2015-11-22: qty 1

## 2015-11-22 MED ORDER — IOPAMIDOL (ISOVUE-M 200) INJECTION 41%
20.0000 mL | Freq: Once | INTRAMUSCULAR | Status: AC
  Administered 2015-11-22: 20 mL via INTRATHECAL

## 2015-11-22 NOTE — Discharge Instructions (Signed)
Myelography, Care After °These instructions give you information on caring for yourself after your procedure. Your doctor may also give you more specific instructions. Call your doctor if you have any problems or questions after your procedure. °HOME CARE °· Rest the first day. °· When you rest, lie flat, with your head slightly raised (elevated). °· Avoid heavy lifting and activity for 48 hours, or as told by your doctor. °· You may take the bandage (dressing) off one day after the test, or as told by your doctor. °· Take all medicines only as told by your doctor. °· Ask your doctor when it is okay to take a shower or bath. °· Ask your doctor when your test results will be ready and how you can get them. Make sure you follow up and get your results. °· Do not drink alcohol for 24 hours, or as told by your doctor. °· Drink enough fluid to keep your pee (urine) clear or pale yellow. °GET HELP IF:  °· You have a fever. °· You have a headache. °· You feel sick to your stomach (nauseous) or throw up (vomit). °· You have pain or cramping in your belly (abdomen). °GET HELP RIGHT AWAY IF:  °· You have a headache with a stiff neck or fever. °· You have trouble breathing. °· Any of the places where the needles were put in are: °¨ Puffy (swollen) or red. °¨ Sore or hot to the touch. °¨ Draining yellowish-white fluid (pus). °¨ Bleeding. °MAKE SURE YOU: °· Understand these instructions. °· Will watch your condition. °· Will get help right away if you are not doing well or get worse. °  °This information is not intended to replace advice given to you by your health care provider. Make sure you discuss any questions you have with your health care provider. °  °Document Released: 12/04/2007 Document Revised: 03/17/2014 Document Reviewed: 11/19/2011 °Elsevier Interactive Patient Education ©2016 Elsevier Inc. ° °

## 2015-11-22 NOTE — Progress Notes (Signed)
Unable to see orders, Dr Venetia MaxonStern was called, pt to be DC at 1300 and he will see if orders can be released.

## 2015-11-22 NOTE — Procedures (Signed)
Lumbar Myelogram with Omnipaque 180, mixed injection, able to access spinal canal at L 12 level

## 2015-11-22 NOTE — Progress Notes (Signed)
Pt c/o nausea, attempted IV start x 2 with 2 different nurse, pt states she has always had a hard time, declines IV team, Dr Venetia MaxonStern was called and asked for oral anti nausea meds, orders followed.

## 2016-01-18 DIAGNOSIS — Z01419 Encounter for gynecological examination (general) (routine) without abnormal findings: Secondary | ICD-10-CM | POA: Diagnosis not present

## 2016-03-04 DIAGNOSIS — R1033 Periumbilical pain: Secondary | ICD-10-CM | POA: Diagnosis not present

## 2016-03-04 DIAGNOSIS — K589 Irritable bowel syndrome without diarrhea: Secondary | ICD-10-CM | POA: Diagnosis not present

## 2016-03-10 HISTORY — PX: ABDOMINAL HERNIA REPAIR: SHX539

## 2016-03-24 DIAGNOSIS — M545 Low back pain: Secondary | ICD-10-CM | POA: Diagnosis not present

## 2016-04-02 DIAGNOSIS — E119 Type 2 diabetes mellitus without complications: Secondary | ICD-10-CM | POA: Diagnosis not present

## 2016-04-02 DIAGNOSIS — B351 Tinea unguium: Secondary | ICD-10-CM | POA: Diagnosis not present

## 2016-04-21 DIAGNOSIS — M545 Low back pain: Secondary | ICD-10-CM | POA: Diagnosis not present

## 2016-04-21 DIAGNOSIS — M47816 Spondylosis without myelopathy or radiculopathy, lumbar region: Secondary | ICD-10-CM | POA: Diagnosis not present

## 2016-05-06 DIAGNOSIS — M47816 Spondylosis without myelopathy or radiculopathy, lumbar region: Secondary | ICD-10-CM | POA: Diagnosis not present

## 2016-05-06 DIAGNOSIS — M545 Low back pain: Secondary | ICD-10-CM | POA: Diagnosis not present

## 2016-05-06 DIAGNOSIS — Z6827 Body mass index (BMI) 27.0-27.9, adult: Secondary | ICD-10-CM | POA: Diagnosis not present

## 2016-05-06 DIAGNOSIS — M5416 Radiculopathy, lumbar region: Secondary | ICD-10-CM | POA: Diagnosis not present

## 2016-05-26 DIAGNOSIS — M47816 Spondylosis without myelopathy or radiculopathy, lumbar region: Secondary | ICD-10-CM | POA: Diagnosis not present

## 2016-05-26 DIAGNOSIS — M545 Low back pain: Secondary | ICD-10-CM | POA: Diagnosis not present

## 2016-07-07 DIAGNOSIS — I10 Essential (primary) hypertension: Secondary | ICD-10-CM | POA: Diagnosis not present

## 2016-07-07 DIAGNOSIS — M545 Low back pain: Secondary | ICD-10-CM | POA: Diagnosis not present

## 2016-07-07 DIAGNOSIS — M5416 Radiculopathy, lumbar region: Secondary | ICD-10-CM | POA: Diagnosis not present

## 2016-07-07 DIAGNOSIS — S32009K Unspecified fracture of unspecified lumbar vertebra, subsequent encounter for fracture with nonunion: Secondary | ICD-10-CM | POA: Diagnosis not present

## 2016-07-07 DIAGNOSIS — M48062 Spinal stenosis, lumbar region with neurogenic claudication: Secondary | ICD-10-CM | POA: Diagnosis not present

## 2016-07-07 DIAGNOSIS — Z6827 Body mass index (BMI) 27.0-27.9, adult: Secondary | ICD-10-CM | POA: Diagnosis not present

## 2016-07-07 DIAGNOSIS — M47816 Spondylosis without myelopathy or radiculopathy, lumbar region: Secondary | ICD-10-CM | POA: Diagnosis not present

## 2016-07-13 DIAGNOSIS — M7062 Trochanteric bursitis, left hip: Secondary | ICD-10-CM | POA: Diagnosis not present

## 2016-07-23 DIAGNOSIS — B351 Tinea unguium: Secondary | ICD-10-CM | POA: Diagnosis not present

## 2016-07-23 DIAGNOSIS — R262 Difficulty in walking, not elsewhere classified: Secondary | ICD-10-CM | POA: Diagnosis not present

## 2016-07-23 DIAGNOSIS — E119 Type 2 diabetes mellitus without complications: Secondary | ICD-10-CM | POA: Diagnosis not present

## 2016-07-24 DIAGNOSIS — M7062 Trochanteric bursitis, left hip: Secondary | ICD-10-CM | POA: Diagnosis not present

## 2016-08-21 DIAGNOSIS — M7062 Trochanteric bursitis, left hip: Secondary | ICD-10-CM | POA: Diagnosis not present

## 2016-08-21 DIAGNOSIS — Z981 Arthrodesis status: Secondary | ICD-10-CM | POA: Diagnosis not present

## 2016-08-25 ENCOUNTER — Telehealth: Payer: Self-pay | Admitting: Internal Medicine

## 2016-08-25 DIAGNOSIS — I519 Heart disease, unspecified: Secondary | ICD-10-CM | POA: Diagnosis not present

## 2016-08-25 DIAGNOSIS — R0602 Shortness of breath: Secondary | ICD-10-CM | POA: Diagnosis not present

## 2016-09-04 ENCOUNTER — Encounter: Payer: Self-pay | Admitting: Physician Assistant

## 2016-09-04 ENCOUNTER — Ambulatory Visit (INDEPENDENT_AMBULATORY_CARE_PROVIDER_SITE_OTHER): Payer: Commercial Managed Care - PPO | Admitting: Physician Assistant

## 2016-09-04 VITALS — BP 126/74 | HR 63 | Ht 64.0 in | Wt 159.0 lb

## 2016-09-04 DIAGNOSIS — R0609 Other forms of dyspnea: Secondary | ICD-10-CM | POA: Diagnosis not present

## 2016-09-04 DIAGNOSIS — E119 Type 2 diabetes mellitus without complications: Secondary | ICD-10-CM

## 2016-09-04 DIAGNOSIS — I200 Unstable angina: Secondary | ICD-10-CM

## 2016-09-04 DIAGNOSIS — I10 Essential (primary) hypertension: Secondary | ICD-10-CM

## 2016-09-04 DIAGNOSIS — R0600 Dyspnea, unspecified: Secondary | ICD-10-CM

## 2016-09-04 DIAGNOSIS — I25700 Atherosclerosis of coronary artery bypass graft(s), unspecified, with unstable angina pectoris: Secondary | ICD-10-CM | POA: Diagnosis not present

## 2016-09-04 DIAGNOSIS — E785 Hyperlipidemia, unspecified: Secondary | ICD-10-CM

## 2016-09-04 MED ORDER — NITROGLYCERIN 0.4 MG/SPRAY TL SOLN: 1.0000 | 1 refills | Status: DC | PRN

## 2016-09-04 NOTE — Patient Instructions (Signed)
Medication Instructions:  DECREASE ASPIRIN TO 81MG   If you need a refill on your cardiac medications before your next appointment, please call your pharmacy.  Follow-Up: Your physician wants you to follow-up in: 4-6 WEEKS WITH DR HILTY.   Special Instructions: PLEASE CALL BACK WITHIN A WEEK ABOUT CATH PROCEDURE.   [facility]

## 2016-09-04 NOTE — Progress Notes (Signed)
Cardiology Office Note    Date:  09/04/2016   ID:  Tamara Nunez, DOB 1955/08/08, MRN [id number]  PCP:  Theodoro Kos, MD  Cardiologist:  Dr. Rennis Golden   Chief Complaint  Patient presents with  . Follow-up    SOB /DOE & chest pain. Seen with Dr. Rennis Golden    History of Present Illness:  Tamara BARAY is a 61 y.o. female with PMH of CAD s/p CABG 2006 LIMA to LAD, SVG to distal LAD, HTN, HLD and DM II. Cardiac catheterization in 2009 showed widely patent grafts. Since then, she continued to have intermittent atypical chest pain which worse with positioning but improved with nitroglycerin spray. Isosorbide helped her in the past, however she later developed headache to it and she claims isosorbide and no longer help her. She also takes Ranexa. She was last seen in April 2017, she was just discharged from the hospital at the time. Myoview obtained on 08/03/2015 showed EF 55%, no ischemia or infarction. Her recent evaluation at her primary care on 08/25/2016 suggest worsening shortness of breath in the last 12 month.  In the past few weeks, she has been having worsening dyspnea on exertion and also chest discomfort. She said, she has been having chronic chest discomfort, but her symptom recently has been more associated with exertion. Her previous angina symptom prior to bypass surgery was a sharp stabbing pain, recent chest pain is more of a pressure-like sensation. However given worsening dyspnea on exertion which is new, I recommended cardiac catheterization. I discussed the case with her primary cardiologist Dr. Rennis Golden who also agrees that with negative Myoview last year, a repeat Myoview is unlikely to help her. I have discussed with the patient risk and benefit of the cardiac catheterization, she wished to think about this for now and let us know within a week.   Past Medical History:  Diagnosis Date  . Coronary artery disease   . Diabetes mellitus without complication (HCC)   .  Hyperlipidemia   . Hypertension     Past Surgical History:  Procedure Laterality Date  . CARDIAC CATHETERIZATION    . CARDIAC CATHETERIZATION  01/13/2008  . CORONARY ARTERY BYPASS GRAFT  2006   2 vessel - LIMA to LAD (atretic), SVG to distal LAD  . CORONARY ARTERY BYPASS GRAFT  12/23/2004  . DOPPLER ECHOCARDIOGRAPHY  04/12/2008   EF 60%  . GASTRIC BYPASS  2009  . NM MYOVIEW LTD  06/03/2012   EF 71%    Current Medications: Outpatient Medications Prior to Visit  Medication Sig Dispense Refill  . albuterol (PROVENTIL HFA;VENTOLIN HFA) 108 (90 BASE) MCG/ACT inhaler Inhale 2 puffs into the lungs every 6 (six) hours as needed for wheezing.    Marland Kitchen aspirin 325 MG EC tablet Take 81 mg by mouth daily.    . cetirizine (ZYRTEC) 10 MG tablet Take 10 mg by mouth as needed for allergies.     . fluticasone (VERAMYST) 27.5 MCG/SPRAY nasal spray Place 2 sprays into the nose daily.    . furosemide (LASIX) 20 MG tablet Take 20 mg by mouth daily as needed for edema.     . isosorbide mononitrate (IMDUR) 30 MG 24 hr tablet Take 30 mg by mouth as needed (chest pain).     Marland Kitchen lidocaine (LIDODERM) 5 % Place 1 patch onto the skin daily. Remove & Discard patch within 12 hours or as directed by MD    . metFORMIN (GLUCOPHAGE) 500 MG tablet Take 500 mg by  mouth 2 (two) times daily with a meal.    . metoprolol tartrate (LOPRESSOR) 25 MG tablet Take 25 mg by mouth 2 (two) times daily.    . Multiple Vitamins-Minerals (MULTIVITAMIN PO) Take 1 tablet by mouth daily.     . pantoprazole (PROTONIX) 40 MG tablet Take 40 mg by mouth daily.    . ramipril (ALTACE) 5 MG capsule Take 5 mg by mouth 2 (two) times daily.     . ranolazine (RANEXA) 1000 MG SR tablet Take 1 tablet (1,000 mg total) by mouth 2 (two) times daily. 56 tablet 0  . simvastatin (ZOCOR) 20 MG tablet Take 20 mg by mouth at bedtime.    . vitamin B-12 (CYANOCOBALAMIN) 1000 MCG tablet Take 1,000 mcg by mouth daily.    . vitamin E 400 UNIT capsule Take 400 Units by  mouth daily.    . nitroGLYCERIN (NITROLINGUAL) 0.4 MG/SPRAY spray Place 1 spray under the tongue every 5 (five) minutes as needed for chest pain. 12 g 1   No facility-administered medications prior to visit.      Allergies:   Codeine; Other; Ciprofloxacin; Valsartan; Adhesive [tape]; Simvastatin; and Sulfa antibiotics   Social History   Social History  . Marital status: Married    Spouse name: N/A  . Number of children: N/A  . Years of education: N/A   Social History Main Topics  . Smoking status: Never Smoker  . Smokeless tobacco: Never Used  . Alcohol use No  . Drug use: No  . Sexual activity: Not Asked   Other Topics Concern  . None   Social History Narrative  . None     Family History:  The patient's family history includes Cancer - Lung in her father; Heart disease in her mother.   ROS:   Please see the history of present illness.    ROS All other systems reviewed and are negative.   PHYSICAL EXAM:   VS:  BP 126/74 (BP Location: Right Arm, Patient Position: Sitting, Cuff Size: Normal)   Pulse 63   Ht 5\' 4"  (1.626 m)   Wt 159 lb (72.1 kg)   SpO2 100%   BMI 27.29 kg/m    GEN: Well nourished, well developed, in no acute distress  HEENT: normal  Neck: no JVD, carotid bruits, or masses Cardiac: RRR; no murmurs, rubs, or gallops,no edema  Respiratory:  clear to auscultation bilaterally, normal work of breathing GI: soft, nontender, nondistended, + BS MS: no deformity or atrophy  Skin: warm and dry, no rash Neuro:  Alert and Oriented x 3, Strength and sensation are intact Psych: euthymic mood, full affect  Wt Readings from Last 3 Encounters:  09/04/16 159 lb (72.1 kg)  11/22/15 150 lb (68 kg)  08/03/15 172 lb (78 kg)      Studies/Labs Reviewed:   EKG:  EKG is ordered today.  The ekg ordered today demonstrates Normal sinus rhythm with right bundle branch block. Unchanged compared to the previous EKG.  Recent Labs: No results found for requested labs  within last 8760 hours.   Lipid Panel    Component Value Date/Time   CHOL  04/15/2010 0400    129        ATP III CLASSIFICATION:  <200     mg/dL   Desirable  [id number]  mg/dL   Borderline High  >=130>=240    mg/dL   High          TRIG 15 04/15/2010 0400   HDL 70 04/15/2010  0400   CHOLHDL 1.8 04/15/2010 0400   VLDL 3 04/15/2010 0400   LDLCALC  04/15/2010 0400    56        Total Cholesterol/HDL:CHD Risk Coronary Heart Disease Risk Table                     Men   Women  1/2 Average Risk   3.4   3.3  Average Risk       5.0   4.4  2 X Average Risk   9.6   7.1  3 X Average Risk  23.4   11.0        Use the calculated Patient Ratio above and the CHD Risk Table to determine the patient's CHD Risk.        ATP III CLASSIFICATION (LDL):  <100     mg/dL   Optimal  [id number]  mg/dL   Near or Above                    Optimal  130-159  mg/dL   Borderline  [id number]  mg/dL   High  >811>190     mg/dL   Very High    Additional studies/ records that were reviewed today include:   Cath 01/13/2008    Myoview 08/03/2015 Study Highlights    The left ventricular ejection fraction is normal (55-65%).  Nuclear stress EF: 55%.  There was no ST segment deviation noted during stress.  The study is normal.   Normal stress nuclear study with no ischemia or infarction; EF 55 with normal wall motion.      ASSESSMENT:    1. DOE (dyspnea on exertion)   2. Coronary artery disease involving coronary bypass graft of native heart with unstable angina pectoris (HCC)   3. Essential hypertension   4. Hyperlipidemia, unspecified hyperlipidemia type   5. Controlled type 2 diabetes mellitus without complication, without long-term current use of insulin (HCC)      PLAN:  In order of problems listed above:  1. DOE: She has chronic chest discomfort, recently she has been noticing more dyspnea on exertion. Her symptom is concerning. She just had a Myoview one year ago, I reviewed her case with her primary  cardiologist Dr. Rennis GoldenHilty, Dr. Rennis GoldenHilty also discussed with the patient during today's office as well, in this situation, we recommended a repeat cardiac catheterization. She wished to think about this at this time, and let us know within a week regarding her decision. Currently on Ranexa  - Discussed with patient possible procedural risk include bleeding, vascular injury, renal injury, arrythmia, MI, stroke and loss of limb or life. She has not made up her mind to proceed with proceed, she will think about it and let us know within a week  2. CAD: Her symptom is concerning for unstable angina. Unfortunately because her chronic chest discomfort, it is difficult to tell. Her recent dyspnea on exertion is quite concerning. Last cardiac catheterization 2009. Negative Myoview in May 2017.  3. Hypertension: Blood pressure stable 126/74 today.  4. Hyperlipidemia: Continue Zocor  5. DM II: On metformin   Medication Adjustments/Labs and Tests Ordered: Current medicines are reviewed at length with the patient today.  Concerns regarding medicines are outlined above.  Medication changes, Labs and Tests ordered today are listed in the Patient Instructions below. Patient Instructions  Medication Instructions:  DECREASE ASPIRIN TO 81MG   If you need a refill on your cardiac medications before your next appointment, please call  your pharmacy.  Follow-Up: Your physician wants you to follow-up in: 4-6 WEEKS WITH DR HILTY.   Special Instructions: PLEASE CALL BACK WITHIN A WEEK ABOUT CATH PROCEDURE.   Thank you for choosing [facility] at [employer], [place], [place]  09/04/2016 9:39 PM    [facility] [address] [place], [place], [place]  [address] Phone: [phone]; Fax: [phone]

## 2016-09-15 ENCOUNTER — Telehealth: Payer: Self-pay | Admitting: Internal Medicine

## 2016-09-15 DIAGNOSIS — R079 Chest pain, unspecified: Secondary | ICD-10-CM

## 2016-09-15 DIAGNOSIS — R5383 Other fatigue: Secondary | ICD-10-CM

## 2016-09-15 DIAGNOSIS — D689 Coagulation defect, unspecified: Secondary | ICD-10-CM

## 2016-09-15 DIAGNOSIS — R0600 Dyspnea, unspecified: Secondary | ICD-10-CM

## 2016-09-15 DIAGNOSIS — R0609 Other forms of dyspnea: Principal | ICD-10-CM

## 2016-09-15 DIAGNOSIS — Z01818 Encounter for other preprocedural examination: Secondary | ICD-10-CM

## 2016-09-15 NOTE — Telephone Encounter (Signed)
Contacted Crystal @ [facility]Dr. Herbie BaltimoreHarding on Friday July 20 @ 1030 AM - to arrive by 8am Scheduled per office note 09/04/16 Wynema Birch([place], [place]) Pre-cath labs + CXR ordered Patient called w/instructions  She would like to have blood work done at PCP office  Call made to PCP office Patient has CXR done June 18 - requested this to [facility]vyn NethLewis[facility] will check to see if labs can be ordered (BMET, CBC, TSH, PT, PTT) and done at their office. She will call back.    ** letter w/instructions will be mailed once info is received from PCP office

## 2016-09-15 NOTE — Telephone Encounter (Signed)
New Message   pt verbalized that she is returning call for rn   She is ready to schedule the cath

## 2016-09-15 NOTE — Telephone Encounter (Signed)
Efrain SellaMatthews, Rachel at 09/15/2016 4:19 PM   Status: Signed    New message   Brandy from [facility] calling jenna back - she states that they were called because pt needed blood work prior to surgery. Gearldine BienenstockBrandy needs a copy of the orders sent directly to their lab fax [phone]. Lab will fax right back once drawn.     Phone: [phone]

## 2016-09-15 NOTE — Telephone Encounter (Signed)
Patient called and notified that PCP office can do lab work and no CXR is needed. Explained she would need to have labs done this week or early next week. Labs ordered and faxed to # below. Cath instruction letter composed & mailed to patient.

## 2016-09-15 NOTE — Telephone Encounter (Signed)
Returned call to patient.She stated she would like to have cath scheduled 7/20.Advised [facility].

## 2016-09-15 NOTE — Telephone Encounter (Signed)
Message copied into original encounter. This encounter will be closed, as it is a duplicate regarding same concern.

## 2016-09-15 NOTE — Telephone Encounter (Signed)
New message   Gearldine BienenstockBrandy from [facility] calling jenna back - she states that they were called because pt needed blood work prior to surgery. Gearldine BienenstockBrandy needs a copy of the orders sent directly to their lab fax [phone]. Lab will fax right back once drawn.

## 2016-09-16 ENCOUNTER — Other Ambulatory Visit: Payer: Self-pay | Admitting: Physician Assistant

## 2016-09-19 LAB — PROTIME-INR

## 2016-09-24 ENCOUNTER — Telehealth: Payer: Self-pay

## 2016-09-24 NOTE — Telephone Encounter (Signed)
Left detailed message per DPR.  Contacted [facility]terization at [facility] scheduled for: 09/26/2016 @ 1030 Verified arrival time and place:  NT @ 0800 Confirmed AM meds to be taken pre-cath with sip of water:   Notified to hold Metformin tomorrow and morning of procedure Notified to hold Lasix day of procedure Notified to take baby ASA prior to arrival. Repeated this twice Patient has responsible person to drive home post procedure and observe patient for 24 hours:  Notified Addl concerns:  Left this nurse name and # for call back if any further questions

## 2016-09-26 ENCOUNTER — Encounter (HOSPITAL_COMMUNITY)
Admission: RE | Disposition: A | Discharge status: Home / Self Care | Payer: Self-pay | Source: Ambulatory Visit | Attending: Cardiology

## 2016-09-26 ENCOUNTER — Ambulatory Visit (HOSPITAL_COMMUNITY)
Admission: RE | Admit: 2016-09-26 | Discharge: 2016-09-27 | Disposition: A | Discharge status: Home / Self Care | Payer: Commercial Managed Care - PPO | Source: Ambulatory Visit | Attending: Cardiology | Admitting: Cardiology

## 2016-09-26 ENCOUNTER — Encounter (HOSPITAL_COMMUNITY): Payer: Self-pay | Admitting: General Practice

## 2016-09-26 DIAGNOSIS — I2582 Chronic total occlusion of coronary artery: Secondary | ICD-10-CM | POA: Insufficient documentation

## 2016-09-26 DIAGNOSIS — I2510 Atherosclerotic heart disease of native coronary artery without angina pectoris: Secondary | ICD-10-CM | POA: Diagnosis present

## 2016-09-26 DIAGNOSIS — I25110 Atherosclerotic heart disease of native coronary artery with unstable angina pectoris: Secondary | ICD-10-CM

## 2016-09-26 DIAGNOSIS — I2584 Coronary atherosclerosis due to calcified coronary lesion: Secondary | ICD-10-CM | POA: Insufficient documentation

## 2016-09-26 DIAGNOSIS — Z9884 Bariatric surgery status: Secondary | ICD-10-CM | POA: Diagnosis not present

## 2016-09-26 DIAGNOSIS — Z7902 Long term (current) use of antithrombotics/antiplatelets: Secondary | ICD-10-CM | POA: Insufficient documentation

## 2016-09-26 DIAGNOSIS — Z7984 Long term (current) use of oral hypoglycemic drugs: Secondary | ICD-10-CM | POA: Diagnosis not present

## 2016-09-26 DIAGNOSIS — I10 Essential (primary) hypertension: Secondary | ICD-10-CM | POA: Diagnosis not present

## 2016-09-26 DIAGNOSIS — I25720 Atherosclerosis of autologous artery coronary artery bypass graft(s) with unstable angina pectoris: Secondary | ICD-10-CM | POA: Diagnosis not present

## 2016-09-26 DIAGNOSIS — I200 Unstable angina: Secondary | ICD-10-CM | POA: Diagnosis present

## 2016-09-26 DIAGNOSIS — Z882 Allergy status to sulfonamides status: Secondary | ICD-10-CM | POA: Insufficient documentation

## 2016-09-26 DIAGNOSIS — E785 Hyperlipidemia, unspecified: Secondary | ICD-10-CM | POA: Diagnosis not present

## 2016-09-26 DIAGNOSIS — I4510 Unspecified right bundle-branch block: Secondary | ICD-10-CM | POA: Insufficient documentation

## 2016-09-26 DIAGNOSIS — E119 Type 2 diabetes mellitus without complications: Secondary | ICD-10-CM | POA: Diagnosis not present

## 2016-09-26 DIAGNOSIS — Z7982 Long term (current) use of aspirin: Secondary | ICD-10-CM | POA: Diagnosis not present

## 2016-09-26 DIAGNOSIS — Z955 Presence of coronary angioplasty implant and graft: Secondary | ICD-10-CM

## 2016-09-26 HISTORY — PX: CORONARY STENT INTERVENTION: CATH118234

## 2016-09-26 HISTORY — PX: CORONARY ANGIOPLASTY WITH STENT PLACEMENT: SHX49

## 2016-09-26 HISTORY — DX: Gastro-esophageal reflux disease without esophagitis: K21.9

## 2016-09-26 HISTORY — PX: LEFT HEART CATH AND CORS/GRAFTS ANGIOGRAPHY: CATH118250

## 2016-09-26 LAB — GLUCOSE, CAPILLARY
Glucose-Capillary: 75 mg/dL (ref 65–99)
Glucose-Capillary: 87 mg/dL (ref 65–99)
Glucose-Capillary: 78 mg/dL (ref 65–99)

## 2016-09-26 LAB — POCT ACTIVATED CLOTTING TIME
Activated Clotting Time: 224 seconds
Activated Clotting Time: 307 seconds
Activated Clotting Time: 147 seconds

## 2016-09-26 SURGERY — LEFT HEART CATH AND CORS/GRAFTS ANGIOGRAPHY
Anesthesia: LOCAL

## 2016-09-26 MED ORDER — CLOPIDOGREL BISULFATE 300 MG PO TABS
ORAL_TABLET | ORAL | Status: AC
  Filled 2016-09-26: qty 2

## 2016-09-26 MED ORDER — IOPAMIDOL (ISOVUE-370) INJECTION 76%
INTRAVENOUS | Status: DC | PRN
  Administered 2016-09-26: 195 mL via INTRA_ARTERIAL

## 2016-09-26 MED ORDER — LABETALOL HCL 5 MG/ML IV SOLN: 10.0000 mg | INTRAVENOUS | Status: AC | PRN

## 2016-09-26 MED ORDER — SODIUM CHLORIDE 0.9% FLUSH
3.0000 mL | Freq: Two times a day (BID) | INTRAVENOUS | Status: DC
  Administered 2016-09-26: 3 mL via INTRAVENOUS

## 2016-09-26 MED ORDER — SODIUM CHLORIDE 0.9 % IV SOLN: INTRAVENOUS | Status: AC

## 2016-09-26 MED ORDER — SODIUM CHLORIDE 0.9% FLUSH: 3.0000 mL | Freq: Two times a day (BID) | INTRAVENOUS | Status: DC

## 2016-09-26 MED ORDER — ONDANSETRON HCL 4 MG/2ML IJ SOLN: 4.0000 mg | Freq: Four times a day (QID) | INTRAMUSCULAR | Status: DC | PRN

## 2016-09-26 MED ORDER — IOPAMIDOL (ISOVUE-370) INJECTION 76%
INTRAVENOUS | Status: AC
  Filled 2016-09-26: qty 100

## 2016-09-26 MED ORDER — LIDOCAINE 5 % EX PTCH
2.0000 | MEDICATED_PATCH | CUTANEOUS | Status: DC
  Administered 2016-09-26: 21:00:00 2 via TRANSDERMAL
  Filled 2016-09-26: qty 2

## 2016-09-26 MED ORDER — IOPAMIDOL (ISOVUE-370) INJECTION 76%
INTRAVENOUS | Status: AC
  Filled 2016-09-26: qty 50

## 2016-09-26 MED ORDER — PANTOPRAZOLE SODIUM 40 MG PO TBEC: 40.0000 mg | DELAYED_RELEASE_TABLET | Freq: Every day | ORAL | Status: DC | PRN

## 2016-09-26 MED ORDER — HEPARIN SODIUM (PORCINE) 1000 UNIT/ML IJ SOLN
INTRAMUSCULAR | Status: DC | PRN
  Administered 2016-09-26: 6000 [IU] via INTRAVENOUS
  Administered 2016-09-26: 3000 [IU] via INTRAVENOUS

## 2016-09-26 MED ORDER — LIDOCAINE HCL (PF) 1 % IJ SOLN
INTRAMUSCULAR | Status: DC | PRN
  Administered 2016-09-26: 18 mL

## 2016-09-26 MED ORDER — LIDOCAINE HCL (PF) 1 % IJ SOLN
INTRAMUSCULAR | Status: AC
  Filled 2016-09-26: qty 30

## 2016-09-26 MED ORDER — CLOPIDOGREL BISULFATE 300 MG PO TABS
ORAL_TABLET | ORAL | Status: DC | PRN
Start: 2016-09-26 — End: 2016-09-26
  Administered 2016-09-26: 600 mg via ORAL

## 2016-09-26 MED ORDER — ALBUTEROL SULFATE (2.5 MG/3ML) 0.083% IN NEBU: 2.5000 mg | INHALATION_SOLUTION | Freq: Four times a day (QID) | RESPIRATORY_TRACT | Status: DC | PRN

## 2016-09-26 MED ORDER — IOPAMIDOL (ISOVUE-370) INJECTION 76%
INTRAVENOUS | Status: AC
  Filled 2016-09-26: qty 125

## 2016-09-26 MED ORDER — ASPIRIN 81 MG PO CHEW: 81.0000 mg | CHEWABLE_TABLET | ORAL | Status: DC

## 2016-09-26 MED ORDER — HEPARIN (PORCINE) IN NACL 2-0.9 UNIT/ML-% IJ SOLN
INTRAMUSCULAR | Status: AC | PRN
  Administered 2016-09-26: 1000 mL

## 2016-09-26 MED ORDER — RAMIPRIL 5 MG PO CAPS
5.0000 mg | ORAL_CAPSULE | Freq: Every day | ORAL | Status: DC
  Administered 2016-09-26: 21:00:00 5 mg via ORAL
  Filled 2016-09-26: qty 1

## 2016-09-26 MED ORDER — HEPARIN SODIUM (PORCINE) 1000 UNIT/ML IJ SOLN
INTRAMUSCULAR | Status: AC
  Filled 2016-09-26: qty 1

## 2016-09-26 MED ORDER — SODIUM CHLORIDE 0.9% FLUSH: 3.0000 mL | INTRAVENOUS | Status: DC | PRN

## 2016-09-26 MED ORDER — ACETAMINOPHEN 325 MG PO TABS
650.0000 mg | ORAL_TABLET | ORAL | Status: DC | PRN
  Administered 2016-09-27: 650 mg via ORAL
  Filled 2016-09-26: qty 2

## 2016-09-26 MED ORDER — MIDAZOLAM HCL 2 MG/2ML IJ SOLN
INTRAMUSCULAR | Status: AC
  Filled 2016-09-26: qty 2

## 2016-09-26 MED ORDER — RANOLAZINE ER 500 MG PO TB12
1000.0000 mg | ORAL_TABLET | Freq: Two times a day (BID) | ORAL | Status: DC
  Administered 2016-09-26: 21:00:00 1000 mg via ORAL
  Filled 2016-09-26: qty 2

## 2016-09-26 MED ORDER — ASPIRIN EC 81 MG PO TBEC: 81.0000 mg | DELAYED_RELEASE_TABLET | Freq: Every day | ORAL | Status: DC

## 2016-09-26 MED ORDER — FENTANYL CITRATE (PF) 100 MCG/2ML IJ SOLN
INTRAMUSCULAR | Status: DC | PRN
  Administered 2016-09-26 (×4): 25 ug via INTRAVENOUS

## 2016-09-26 MED ORDER — MIDAZOLAM HCL 2 MG/2ML IJ SOLN
INTRAMUSCULAR | Status: DC | PRN
  Administered 2016-09-26 (×4): 1 mg via INTRAVENOUS

## 2016-09-26 MED ORDER — SIMVASTATIN 20 MG PO TABS
20.0000 mg | ORAL_TABLET | Freq: Every day | ORAL | Status: DC
  Administered 2016-09-26: 21:00:00 20 mg via ORAL
  Filled 2016-09-26: qty 1

## 2016-09-26 MED ORDER — SODIUM CHLORIDE 0.9 % WEIGHT BASED INFUSION: 1.0000 mL/kg/h | INTRAVENOUS | Status: DC

## 2016-09-26 MED ORDER — FLUTICASONE PROPIONATE 50 MCG/ACT NA SUSP
1.0000 | Freq: Every day | NASAL | Status: DC
  Administered 2016-09-26: 18:00:00 1 via NASAL
  Filled 2016-09-26: qty 16

## 2016-09-26 MED ORDER — SODIUM CHLORIDE 0.9 % IV SOLN: 250.0000 mL | INTRAVENOUS | Status: DC | PRN

## 2016-09-26 MED ORDER — NITROGLYCERIN 0.4 MG SL SUBL: 0.4000 mg | SUBLINGUAL_TABLET | SUBLINGUAL | Status: DC | PRN

## 2016-09-26 MED ORDER — METOPROLOL TARTRATE 25 MG PO TABS
25.0000 mg | ORAL_TABLET | Freq: Two times a day (BID) | ORAL | Status: DC
  Administered 2016-09-26: 21:00:00 25 mg via ORAL
  Filled 2016-09-26: qty 1

## 2016-09-26 MED ORDER — HYDRALAZINE HCL 20 MG/ML IJ SOLN: 5.0000 mg | INTRAMUSCULAR | Status: AC | PRN

## 2016-09-26 MED ORDER — HEPARIN (PORCINE) IN NACL 2-0.9 UNIT/ML-% IJ SOLN
INTRAMUSCULAR | Status: AC
  Filled 2016-09-26: qty 1000

## 2016-09-26 MED ORDER — FENTANYL CITRATE (PF) 100 MCG/2ML IJ SOLN
INTRAMUSCULAR | Status: AC
  Filled 2016-09-26: qty 2

## 2016-09-26 MED ORDER — CLOPIDOGREL BISULFATE 75 MG PO TABS
75.0000 mg | ORAL_TABLET | Freq: Every day | ORAL | Status: DC
  Administered 2016-09-27: 08:00:00 75 mg via ORAL
  Filled 2016-09-26: qty 1

## 2016-09-26 MED ORDER — SODIUM CHLORIDE 0.9 % WEIGHT BASED INFUSION
3.0000 mL/kg/h | INTRAVENOUS | Status: DC
  Administered 2016-09-26: 3 mL/kg/h via INTRAVENOUS

## 2016-09-26 SURGICAL SUPPLY — 16 items
BALLN WOLVERINE 2.00X6 (BALLOONS) ×2
BALLOON WOLVERINE 2.00X6 (BALLOONS) IMPLANT
CATH INFINITI 5 FR LCB (CATHETERS) ×1 IMPLANT
CATH INFINITI 5FR MULTPACK ANG (CATHETERS) ×1 IMPLANT
CATHETER LAUNCHER 6FR LCB (CATHETERS) ×1 IMPLANT
KIT ENCORE 26 ADVANTAGE (KITS) ×1 IMPLANT
KIT HEART LEFT (KITS) ×2 IMPLANT
PACK CARDIAC CATHETERIZATION (CUSTOM PROCEDURE TRAY) ×2 IMPLANT
SHEATH PINNACLE 5F 10CM (SHEATH) ×1 IMPLANT
SHEATH PINNACLE 6F 10CM (SHEATH) ×1 IMPLANT
STENT SYNERGY DES 2.50X8 (Permanent Stent) ×1 IMPLANT
TRANSDUCER W/STOPCOCK (MISCELLANEOUS) ×2 IMPLANT
TUBING CIL FLEX 10 FLL-RA (TUBING) ×3 IMPLANT
VALVE GUARDIAN II ~~LOC~~ HEMO (MISCELLANEOUS) ×1 IMPLANT
WIRE COUGAR XT STRL 190CM (WIRE) ×1 IMPLANT
WIRE EMERALD 3MM-J .035X150CM (WIRE) ×1 IMPLANT

## 2016-09-26 NOTE — Interval H&P Note (Signed)
History and Physical Interval Note:  09/26/2016 8:50 AM  Derwood KaplanWanda O Eley  has presented today for surgery, with the diagnosis of cad, dyspnea upon excertion.    The various methods of treatment have been discussed with the patient and family. After consideration of risks, benefits and other options for treatment, the patient has consented to  Procedure(s): Left Heart Cath and Cors/Grafts Angiography (N/A) with possible Percutaneous Coronary Intervention as a surgical intervention .  The patient's history has been reviewed, patient examined, no change in status, stable for surgery.  I have reviewed the patient's chart and labs.  Questions were answered to the patient's satisfaction.    Cath Lab Visit (complete for each Cath Lab visit)  Clinical Evaluation Leading to the Procedure:   ACS: No.  Non-ACS:    Anginal Classification: CCS III  Anti-ischemic medical therapy: Maximal Therapy (2 or more classes of medications)  Non-Invasive Test Results: Equivocal test results  persistent symptoms despite negative Myoview last year.  Prior CABG: Previous CABG   Bryan Lemmaavid Harding

## 2016-09-26 NOTE — Care Management Note (Signed)
Case Management Note  Patient Details  Name: Tamara Nunez MRN: [id number] Date of Birth: 07/15/1955  Subjective/Objective:  From home with spouse, pta indep with cane,  s/p coronary stent intervention will be on plavix,  She has PCP and medication coverage.                   Action/Plan: NCM will follow for dc needs.   Expected Discharge Date:                  Expected Discharge Plan:  Home/Self Care  In-House Referral:     Discharge planning Services  CM Consult  Post Acute Care Choice:    Choice offered to:     DME Arranged:    DME Agency:     HH Arranged:    HH Agency:     Status of Service:  Completed, signed off  If discussed at [employer] Length of Stay Meetings, dates discussed:    Additional Comments:  Leone Havenaylor, [place], RN 09/26/2016, 12:40 PM

## 2016-09-26 NOTE — H&P (View-Only) (Signed)
Cardiology Office Note    Date:  09/04/2016   ID:  Tamara Nunez, DOB 1955/08/08, MRN [id number]  PCP:  Theodoro Kos, MD  Cardiologist:  Dr. Rennis Golden   Chief Complaint  Patient presents with  . Follow-up    SOB /DOE & chest pain. Seen with Dr. Rennis Golden    History of Present Illness:  Tamara Nunez is a 61 y.o. female with PMH of CAD s/p CABG 2006 LIMA to LAD, SVG to distal LAD, HTN, HLD and DM II. Cardiac catheterization in 2009 showed widely patent grafts. Since then, she continued to have intermittent atypical chest pain which worse with positioning but improved with nitroglycerin spray. Isosorbide helped her in the past, however she later developed headache to it and she claims isosorbide and no longer help her. She also takes Ranexa. She was last seen in April 2017, she was just discharged from the hospital at the time. Myoview obtained on 08/03/2015 showed EF 55%, no ischemia or infarction. Her recent evaluation at her primary care on 08/25/2016 suggest worsening shortness of breath in the last 12 month.  In the past few weeks, she has been having worsening dyspnea on exertion and also chest discomfort. She said, she has been having chronic chest discomfort, but her symptom recently has been more associated with exertion. Her previous angina symptom prior to bypass surgery was a sharp stabbing pain, recent chest pain is more of a pressure-like sensation. However given worsening dyspnea on exertion which is new, I recommended cardiac catheterization. I discussed the case with her primary cardiologist Dr. Rennis Golden who also agrees that with negative Myoview last year, a repeat Myoview is unlikely to help her. I have discussed with the patient risk and benefit of the cardiac catheterization, she wished to think about this for now and let us know within a week.   Past Medical History:  Diagnosis Date  . Coronary artery disease   . Diabetes mellitus without complication (HCC)   .  Hyperlipidemia   . Hypertension     Past Surgical History:  Procedure Laterality Date  . CARDIAC CATHETERIZATION    . CARDIAC CATHETERIZATION  01/13/2008  . CORONARY ARTERY BYPASS GRAFT  2006   2 vessel - LIMA to LAD (atretic), SVG to distal LAD  . CORONARY ARTERY BYPASS GRAFT  12/23/2004  . DOPPLER ECHOCARDIOGRAPHY  04/12/2008   EF 60%  . GASTRIC BYPASS  2009  . NM MYOVIEW LTD  06/03/2012   EF 71%    Current Medications: Outpatient Medications Prior to Visit  Medication Sig Dispense Refill  . albuterol (PROVENTIL HFA;VENTOLIN HFA) 108 (90 BASE) MCG/ACT inhaler Inhale 2 puffs into the lungs every 6 (six) hours as needed for wheezing.    Marland Kitchen aspirin 325 MG EC tablet Take 81 mg by mouth daily.    . cetirizine (ZYRTEC) 10 MG tablet Take 10 mg by mouth as needed for allergies.     . fluticasone (VERAMYST) 27.5 MCG/SPRAY nasal spray Place 2 sprays into the nose daily.    . furosemide (LASIX) 20 MG tablet Take 20 mg by mouth daily as needed for edema.     . isosorbide mononitrate (IMDUR) 30 MG 24 hr tablet Take 30 mg by mouth as needed (chest pain).     Marland Kitchen lidocaine (LIDODERM) 5 % Place 1 patch onto the skin daily. Remove & Discard patch within 12 hours or as directed by MD    . metFORMIN (GLUCOPHAGE) 500 MG tablet Take 500 mg by  mouth 2 (two) times daily with a meal.    . metoprolol tartrate (LOPRESSOR) 25 MG tablet Take 25 mg by mouth 2 (two) times daily.    . Multiple Vitamins-Minerals (MULTIVITAMIN PO) Take 1 tablet by mouth daily.     . pantoprazole (PROTONIX) 40 MG tablet Take 40 mg by mouth daily.    . ramipril (ALTACE) 5 MG capsule Take 5 mg by mouth 2 (two) times daily.     . ranolazine (RANEXA) 1000 MG SR tablet Take 1 tablet (1,000 mg total) by mouth 2 (two) times daily. 56 tablet 0  . simvastatin (ZOCOR) 20 MG tablet Take 20 mg by mouth at bedtime.    . vitamin B-12 (CYANOCOBALAMIN) 1000 MCG tablet Take 1,000 mcg by mouth daily.    . vitamin E 400 UNIT capsule Take 400 Units by  mouth daily.    . nitroGLYCERIN (NITROLINGUAL) 0.4 MG/SPRAY spray Place 1 spray under the tongue every 5 (five) minutes as needed for chest pain. 12 g 1   No facility-administered medications prior to visit.      Allergies:   Codeine; Other; Ciprofloxacin; Valsartan; Adhesive [tape]; Simvastatin; and Sulfa antibiotics   Social History   Social History  . Marital status: Married    Spouse name: N/A  . Number of children: N/A  . Years of education: N/A   Social History Main Topics  . Smoking status: Never Smoker  . Smokeless tobacco: Never Used  . Alcohol use No  . Drug use: No  . Sexual activity: Not Asked   Other Topics Concern  . None   Social History Narrative  . None     Family History:  The patient's family history includes Cancer - Lung in her father; Heart disease in her mother.   ROS:   Please see the history of present illness.    ROS All other systems reviewed and are negative.   PHYSICAL EXAM:   VS:  BP 126/74 (BP Location: Right Arm, Patient Position: Sitting, Cuff Size: Normal)   Pulse 63   Ht 5\' 4"  (1.626 m)   Wt 159 lb (72.1 kg)   SpO2 100%   BMI 27.29 kg/m    GEN: Well nourished, well developed, in no acute distress  HEENT: normal  Neck: no JVD, carotid bruits, or masses Cardiac: RRR; no murmurs, rubs, or gallops,no edema  Respiratory:  clear to auscultation bilaterally, normal work of breathing GI: soft, nontender, nondistended, + BS MS: no deformity or atrophy  Skin: warm and dry, no rash Neuro:  Alert and Oriented x 3, Strength and sensation are intact Psych: euthymic mood, full affect  Wt Readings from Last 3 Encounters:  09/04/16 159 lb (72.1 kg)  11/22/15 150 lb (68 kg)  08/03/15 172 lb (78 kg)      Studies/Labs Reviewed:   EKG:  EKG is ordered today.  The ekg ordered today demonstrates Normal sinus rhythm with right bundle branch block. Unchanged compared to the previous EKG.  Recent Labs: No results found for requested labs  within last 8760 hours.   Lipid Panel    Component Value Date/Time   CHOL  04/15/2010 0400    129        ATP III CLASSIFICATION:  <200     mg/dL   Desirable  [id number]  mg/dL   Borderline High  >=130>=240    mg/dL   High          TRIG 15 04/15/2010 0400   HDL 70 04/15/2010  0400   CHOLHDL 1.8 04/15/2010 0400   VLDL 3 04/15/2010 0400   LDLCALC  04/15/2010 0400    56        Total Cholesterol/HDL:CHD Risk Coronary Heart Disease Risk Table                     Men   Women  1/2 Average Risk   3.4   3.3  Average Risk       5.0   4.4  2 X Average Risk   9.6   7.1  3 X Average Risk  23.4   11.0        Use the calculated Patient Ratio above and the CHD Risk Table to determine the patient's CHD Risk.        ATP III CLASSIFICATION (LDL):  <100     mg/dL   Optimal  [id number]  mg/dL   Near or Above                    Optimal  130-159  mg/dL   Borderline  [id number]  mg/dL   High  >811>190     mg/dL   Very High    Additional studies/ records that were reviewed today include:   Cath 01/13/2008    Myoview 08/03/2015 Study Highlights    The left ventricular ejection fraction is normal (55-65%).  Nuclear stress EF: 55%.  There was no ST segment deviation noted during stress.  The study is normal.   Normal stress nuclear study with no ischemia or infarction; EF 55 with normal wall motion.      ASSESSMENT:    1. DOE (dyspnea on exertion)   2. Coronary artery disease involving coronary bypass graft of native heart with unstable angina pectoris (HCC)   3. Essential hypertension   4. Hyperlipidemia, unspecified hyperlipidemia type   5. Controlled type 2 diabetes mellitus without complication, without long-term current use of insulin (HCC)      PLAN:  In order of problems listed above:  1. DOE: She has chronic chest discomfort, recently she has been noticing more dyspnea on exertion. Her symptom is concerning. She just had a Myoview one year ago, I reviewed her case with her primary  cardiologist Dr. Rennis GoldenHilty, Dr. Rennis GoldenHilty also discussed with the patient during today's office as well, in this situation, we recommended a repeat cardiac catheterization. She wished to think about this at this time, and let us know within a week regarding her decision. Currently on Ranexa  - Discussed with patient possible procedural risk include bleeding, vascular injury, renal injury, arrythmia, MI, stroke and loss of limb or life. She has not made up her mind to proceed with proceed, she will think about it and let us know within a week  2. CAD: Her symptom is concerning for unstable angina. Unfortunately because her chronic chest discomfort, it is difficult to tell. Her recent dyspnea on exertion is quite concerning. Last cardiac catheterization 2009. Negative Myoview in May 2017.  3. Hypertension: Blood pressure stable 126/74 today.  4. Hyperlipidemia: Continue Zocor  5. DM II: On metformin   Medication Adjustments/Labs and Tests Ordered: Current medicines are reviewed at length with the patient today.  Concerns regarding medicines are outlined above.  Medication changes, Labs and Tests ordered today are listed in the Patient Instructions below. Patient Instructions  Medication Instructions:  DECREASE ASPIRIN TO 81MG   If you need a refill on your cardiac medications before your next appointment, please call  your pharmacy.  Follow-Up: Your physician wants you to follow-up in: 4-6 WEEKS WITH DR HILTY.   Special Instructions: PLEASE CALL BACK WITHIN A WEEK ABOUT CATH PROCEDURE.   Thank you for choosing [facility] at [employer], [place], [place]  09/04/2016 9:39 PM    [facility] [address] [place], [place], [place]  [address] Phone: [phone]; Fax: [phone]

## 2016-09-26 NOTE — Progress Notes (Signed)
Site area: right groin  Site Prior to Removal:  Level 0  Pressure Applied For 20 MINUTES    Minutes Beginning at 1420  Manual:   Yes.    Patient Status During Pull:  stable  Post Pull Groin Site:  Level 0  Post Pull Instructions Given:  Yes.    Post Pull Pulses Present:  Yes.    Dressing Applied:  Yes.    Comments:

## 2016-09-27 ENCOUNTER — Encounter (HOSPITAL_COMMUNITY): Payer: Self-pay | Admitting: Student

## 2016-09-27 DIAGNOSIS — I2584 Coronary atherosclerosis due to calcified coronary lesion: Secondary | ICD-10-CM | POA: Diagnosis not present

## 2016-09-27 DIAGNOSIS — E119 Type 2 diabetes mellitus without complications: Secondary | ICD-10-CM | POA: Diagnosis not present

## 2016-09-27 DIAGNOSIS — I200 Unstable angina: Secondary | ICD-10-CM

## 2016-09-27 DIAGNOSIS — I10 Essential (primary) hypertension: Secondary | ICD-10-CM | POA: Diagnosis not present

## 2016-09-27 DIAGNOSIS — E785 Hyperlipidemia, unspecified: Secondary | ICD-10-CM | POA: Diagnosis not present

## 2016-09-27 DIAGNOSIS — I4510 Unspecified right bundle-branch block: Secondary | ICD-10-CM | POA: Diagnosis not present

## 2016-09-27 DIAGNOSIS — Z9884 Bariatric surgery status: Secondary | ICD-10-CM | POA: Diagnosis not present

## 2016-09-27 DIAGNOSIS — Z7984 Long term (current) use of oral hypoglycemic drugs: Secondary | ICD-10-CM | POA: Diagnosis not present

## 2016-09-27 DIAGNOSIS — Z7902 Long term (current) use of antithrombotics/antiplatelets: Secondary | ICD-10-CM | POA: Diagnosis not present

## 2016-09-27 DIAGNOSIS — Z7982 Long term (current) use of aspirin: Secondary | ICD-10-CM | POA: Diagnosis not present

## 2016-09-27 DIAGNOSIS — I25720 Atherosclerosis of autologous artery coronary artery bypass graft(s) with unstable angina pectoris: Secondary | ICD-10-CM | POA: Diagnosis not present

## 2016-09-27 DIAGNOSIS — I2582 Chronic total occlusion of coronary artery: Secondary | ICD-10-CM | POA: Diagnosis not present

## 2016-09-27 DIAGNOSIS — Z882 Allergy status to sulfonamides status: Secondary | ICD-10-CM | POA: Diagnosis not present

## 2016-09-27 LAB — BASIC METABOLIC PANEL
Anion gap: 6 (ref 5–15)
BUN: 15 mg/dL (ref 6–20)
CO2: 23 mmol/L (ref 22–32)
Calcium: 8.6 mg/dL — ABNORMAL LOW (ref 8.9–10.3)
Chloride: 109 mmol/L (ref 101–111)
Creatinine, Ser: 0.76 mg/dL (ref 0.44–1.00)
GFR calc Af Amer: >60 mL/min (ref >60)
GFR calc non Af Amer: >60 mL/min (ref >60)
Glucose, Bld: 91 mg/dL (ref 65–99)
Potassium: 4.2 mmol/L (ref 3.5–5.1)
Sodium: 138 mmol/L (ref 135–145)

## 2016-09-27 LAB — CBC
HCT: 33.1 % — ABNORMAL LOW (ref 36.0–46.0)
Hemoglobin: 10.5 g/dL — ABNORMAL LOW (ref 12.0–15.0)
MCH: 27.4 pg (ref 26.0–34.0)
MCHC: 31.7 g/dL (ref 30.0–36.0)
MCV: 86.4 fL (ref 78.0–100.0)
Platelets: 272 10*3/uL (ref 150–400)
RBC: 3.83 MIL/uL — ABNORMAL LOW (ref 3.87–5.11)
RDW: 15.4 % (ref 11.5–15.5)
WBC: 6.4 10*3/uL (ref 4.0–10.5)

## 2016-09-27 LAB — GLUCOSE, CAPILLARY: Glucose-Capillary: 76 mg/dL (ref 65–99)

## 2016-09-27 MED ORDER — CLOPIDOGREL BISULFATE 75 MG PO TABS: 75.0000 mg | ORAL_TABLET | Freq: Every day | ORAL | 3 refills | Status: DC

## 2016-09-27 MED ORDER — ANGIOPLASTY BOOK
Freq: Once | Status: AC
  Administered 2016-09-27: 07:00:00
  Filled 2016-09-27: qty 1

## 2016-09-27 NOTE — Discharge Summary (Signed)
Discharge Summary    Patient ID: Tamara Nunez,  MRN: [id number], DOB/AGE: 04-13-1955 61 y.o.  Admit date: 09/26/2016 Discharge date: 09/27/2016  Primary Care Provider: Theodoro Kos Primary Cardiologist: Dr. Rennis Golden  Discharge Diagnoses    Principal Problem:   Progressive angina [facility]) Active Problems:   CAD (coronary artery disease)   HTN (hypertension)   Dyslipidemia   History of Present Illness     Tamara Nunez is a 61 y.o. female with past medical history of CAD (s/p CABG in 2006 with LIMA-LAD, SVG-dLAD), HTN, HLD, and Type 2 DM who was recently evaluated in the office and reported worsening chest pain and dyspnea on exertion over the past several weeks. With her concerning symptoms and just having a recent negative NST in 2017, a cardiac catheterization was recommended for definitive evaluation, therefore this was arranged and she presented to [facility] on 09/26/2016 for the procedure.   Hospital Course     Consultants: None  Cardiac catheterization showed severe 2-vessel disease with an occluded LAD but patent SVG-dLAD. LIMA graft was atretic. She did have an 80% proximal RCA lesion with PCI being performed with a DES. No significant disease along the LCx or dRCA. She Tamara Nunez continue on DAPT with ASA and Plavix.   The following morning, the patient reported significant improvement in her chest pain when compared to previously. Was ambulating around the room without difficulty. Her right groin site was stable with no ecchymosis or evidence of a hematoma. Telemetry overnight showed NSR with HR in the 60's - 80's and no ectopic events. Labs showed stable electrolytes and kidney function with creatinine of 0.76. Hgb had dropped to 10.5 but she denied any evidence of active bleeding. She Tamara Nunez need a repeat CBC at the time of her follow-up appointment. She was last examined by Dr. Elberta Fortis and deemed stable for discharge.   _____________  Discharge Physical Examination and  Vitals Blood pressure (!) 101/50, pulse 76, temperature 98.2 F (36.8 C), temperature source Oral, resp. rate 18, height 5\' 4"  (1.626 m), weight 154 lb 12.2 oz (70.2 kg), SpO2 100 %.  Filed Weights   09/26/16 0754 09/27/16 0209  Weight: 160 lb (72.6 kg) 154 lb 12.2 oz (70.2 kg)   General: Pleasant African American female appearing in NAD Psych: Normal affect. Neuro: Alert and oriented X 3. Moves all extremities spontaneously. HEENT: Normal  Neck: Supple without bruits or JVD. Lungs:  Resp regular and unlabored, CTA without wheezing or rales. Heart: RRR no s3, s4, or murmurs. Abdomen: Soft, non-tender, non-distended, BS + x 4.  Extremities: No clubbing, cyanosis or edema. DP/PT/Radials 2+ and equal bilaterally. Right groin site is stable without ecchymosis or evidence of a hematoma.   Labs & Radiologic Studies     CBC  Recent Labs  09/27/16 0414  WBC 6.4  HGB 10.5*  HCT 33.1*  MCV 86.4  PLT 272   Basic Metabolic Panel  Recent Labs  09/27/16 0414  NA 138  K 4.2  CL 109  CO2 23  GLUCOSE 91  BUN 15  CREATININE 0.76  CALCIUM 8.6*   Liver Function Tests No results for input(s): AST, ALT, ALKPHOS, BILITOT, PROT, ALBUMIN in the last 72 hours. No results for input(s): LIPASE, AMYLASE in the last 72 hours. Cardiac Enzymes No results for input(s): CKTOTAL, CKMB, CKMBINDEX, TROPONINI in the last 72 hours. BNP Invalid input(s): POCBNP D-Dimer No results for input(s): DDIMER in the last 72 hours. Hemoglobin A1C No results  for input(s): HGBA1C in the last 72 hours. Fasting Lipid Panel No results for input(s): CHOL, HDL, LDLCALC, TRIG, CHOLHDL, LDLDIRECT in the last 72 hours. Thyroid Function Tests No results for input(s): TSH, T4TOTAL, T3FREE, THYROIDAB in the last 72 hours.  Invalid input(s): FREET3  No results found.   Diagnostic Studies/Procedures     Cardiac Catheterization: 09/26/2016  Prox RCA lesion, 80 %stenosed.  A STENT SYNERGY DES 2.50X8 drug  eluting stent was successfully placed.  Post intervention, there is a 0% residual stenosis.  _____________________________________  Prox LAD to Mid LAD lesion, 60 %stenosed. Mid LAD lesion, 100 %stenosed.  LIMA graft was visualized by angiography and atretic - does not reach the heart.  SVG-dLAD: graft was visualized by angiography and is large and anatomically normal.  The left ventricular systolic function is normal.  LV end diastolic pressure is normal.  There is no aortic valve stenosis.  There is no mitral valve regurgitation.  The left ventricular ejection fraction is 55-65% by visual estimate.   Severe 2 vessel disease with occluded LAD but patent vein graft to the distal LAD. There is also a severe focal lesion in the proximal RCA treated with a DES stent. Otherwise no significant disease in the circumflex system and the distal RCA.   Plan: Overnight outpatient with extended recovery post PCI  Sheath removal in PACU holding area  Dual antiplatelet therapy for minimum of 3 months, then continue Plavix beyond but Tamara Nunez allow discontinuation of aspirin for primary cardiologist recommendation    Disposition   Pt is being discharged home today in good condition.  Follow-up Plans & Appointments    Follow-up Information    Tamara Nunez, Tamara Nunez, [place] Follow up.   Specialties:  Cardiology, Radiology Why:  Cardiology Hospital Follow-Up on 10/13/2016 at 11:00AM.  Contact information: [address] [place] [address] [phone]          Discharge Instructions    AMB Referral to Cardiac Rehabilitation - Phase II    Complete by:  As directed    Diagnosis:  Coronary Stents   Diet - low sodium heart healthy    Complete by:  As directed    Discharge instructions    Complete by:  As directed    PLEASE REMEMBER TO BRING ALL OF YOUR MEDICATIONS TO EACH OF YOUR FOLLOW-UP OFFICE VISITS.  PLEASE ATTEND ALL SCHEDULED FOLLOW-UP APPOINTMENTS.   Activity: Increase  activity slowly as tolerated. You may shower, but no soaking baths (or swimming) for 1 week. No driving for 24 hours. No lifting over 5 lbs for 1 week. No sexual activity for 1 week.   You May Return to Work: in 1 week (if applicable)  Wound Care: You may wash cath site gently with soap and water. Keep cath site clean and dry. If you notice pain, swelling, bleeding or pus at your cath site, please call [phone].      Discharge Medications     Medication List    TAKE these medications   acetaminophen 500 MG tablet Commonly known as:  TYLENOL Take 1,000 mg by mouth every 6 (six) hours as needed for moderate pain or headache.   albuterol 108 (90 Base) MCG/ACT inhaler Commonly known as:  PROVENTIL HFA;VENTOLIN HFA Inhale 2 puffs into the lungs every 6 (six) hours as needed for wheezing.   aspirin EC 81 MG tablet Take 81 mg by mouth daily.   cetirizine 10 MG tablet Commonly known as:  ZYRTEC Take 10 mg by mouth as  needed for allergies.   clopidogrel 75 MG tablet Commonly known as:  PLAVIX Take 1 tablet (75 mg total) by mouth daily with breakfast.   fluticasone 50 MCG/ACT nasal spray Commonly known as:  FLONASE Place 1 spray into both nostrils daily.   furosemide 20 MG tablet Commonly known as:  LASIX Take 20 mg by mouth daily as needed for edema.   isosorbide mononitrate 30 MG 24 hr tablet Commonly known as:  IMDUR Take 30 mg by mouth as needed (chest pain).   lidocaine 5 % Commonly known as:  LIDODERM Place 2 patches onto the skin daily. Remove & Discard patch within 12 hours or as directed by MD   metFORMIN 500 MG tablet Commonly known as:  GLUCOPHAGE Take 250 mg by mouth 2 (two) times daily. Notes to patient:  RESUME ON 09/29/2016   metoprolol tartrate 25 MG tablet Commonly known as:  LOPRESSOR Take 25 mg by mouth 2 (two) times daily.   MULTIVITAMIN PO Take 1 tablet by mouth daily.   nitroGLYCERIN 0.4 MG/SPRAY spray Commonly known as:   NITROLINGUAL Place 1 spray under the tongue every 5 (five) minutes as needed for chest pain.   pantoprazole 40 MG tablet Commonly known as:  PROTONIX Take 40 mg by mouth daily as needed (acid reflux).   ramipril 5 MG capsule Commonly known as:  ALTACE Take 5 mg by mouth at bedtime.   ranolazine 1000 MG SR tablet Commonly known as:  RANEXA Take 1 tablet (1,000 mg total) by mouth 2 (two) times daily.   simvastatin 20 MG tablet Commonly known as:  ZOCOR Take 20 mg by mouth at bedtime.   vitamin B-12 1000 MCG tablet Commonly known as:  CYANOCOBALAMIN Take 1,000 mcg by mouth daily.   vitamin E 400 UNIT capsule Take 400 Units by mouth daily.         Allergies Allergies  Allergen Reactions  . Codeine Hives and Shortness Of Breath       . Other Anaphylaxis    Almonds   . Ciprofloxacin Itching  . Valsartan Rash  . Adhesive [Tape] Itching    "clear tape" used after surgery."took all her skin off".  Paper take is ok  . Sulfa Antibiotics     nausea    Outstanding Labs/Studies   CBC at follow-up appointment.   Duration of Discharge Encounter   Greater than 30 minutes including physician time.  Signed, Ellsworth Lennox, PA-C 09/27/2016, 7:54 AM  I have seen and examined this patient with Tamara Nunez.  Agree with above, note added to reflect my findings.  On exam, RRR, no murmurs, lungs clear. Had proximal RCA stent placed for worsening angina. No chest pain this AM, walked without issue. Plan for discharge today.  Kiante Ciavarella M. Montrice Montuori MD 09/27/2016 8:01 AM

## 2016-09-27 NOTE — Progress Notes (Signed)
CARDIAC REHAB PHASE I   PRE:  Rate/Rhythm: 79 SR  BP:  Supine: 105/52 Sitting:   Standing:    SaO2: 98% RA  MODE:  Ambulation: 500 ft   POST:  Rate/Rhythm: 97  BP:  Supine:   Sitting: 112/60  Standing:    SaO2: 99% RA  [id number] Patient tolerated ambulation well with assist x1, one standing rest break taken. To bedside after walk, no c/o VSS. PCI education complete including restrictions, risk factor modification, Plavix use, CP, NTG use and calling 911, heart healthy diet handout and exercise guidelines given. Pt verbalizes understanding of instructions given. Discussed Phase 2 cardiac rehab, and permission given to send contact information to program in [place].  Artist Paislinty M Gailene Youkhana, MS, ACSM CEP

## 2016-10-06 ENCOUNTER — Telehealth: Payer: Self-pay | Admitting: Internal Medicine

## 2016-10-06 MED ORDER — CLOPIDOGREL BISULFATE 75 MG PO TABS: 75.0000 mg | ORAL_TABLET | Freq: Every day | ORAL | 3 refills | Status: DC

## 2016-10-06 NOTE — Telephone Encounter (Signed)
Returned call to patient She states her clopidogrel is "not working" and "is not doing what it is supposed to do" She states she is hurting, she still feels tired, she still feels drained She states she is NOT taking the generic for Plavix Attempted to explain to patient what clopidogrel does as far as mechanism of action but patient insists that she only wants the brand name for this medication.  Rx(s) sent to pharmacy electronically. Patient states she will pay out of pocket for bran name if she has to

## 2016-10-06 NOTE — Telephone Encounter (Signed)
Patient calling, states that she is on the generic form of Plavix but states that "its not working."  Patient states that [facility] is requesting a prescription of Brand name Plavix. [facility] phone # [phone] and fax # is [phone].

## 2016-10-13 ENCOUNTER — Ambulatory Visit: Payer: Commercial Managed Care - PPO | Admitting: Physician Assistant

## 2016-10-14 ENCOUNTER — Telehealth: Payer: Self-pay | Admitting: Internal Medicine

## 2016-10-14 NOTE — Telephone Encounter (Signed)
Faxed orders for cardiac rehab to [facility] (dx: PTCA or stent placement) with signed acknowledgement of cardiac rehab complication standing orders for acute chest pain, symptomatic hypotension, life-threatening arrhythmias, diabetic orders, hypoglycemic symptoms + H&P, cath reports, most recent CBC/CMP or BMP, EKG

## 2016-10-27 ENCOUNTER — Ambulatory Visit (INDEPENDENT_AMBULATORY_CARE_PROVIDER_SITE_OTHER): Payer: Commercial Managed Care - PPO | Admitting: Internal Medicine

## 2016-10-27 ENCOUNTER — Encounter: Payer: Self-pay | Admitting: Internal Medicine

## 2016-10-27 ENCOUNTER — Other Ambulatory Visit: Payer: Self-pay | Admitting: Internal Medicine

## 2016-10-27 VITALS — BP 124/68 | HR 70 | Ht 64.0 in | Wt 163.0 lb

## 2016-10-27 DIAGNOSIS — Z951 Presence of aortocoronary bypass graft: Secondary | ICD-10-CM | POA: Diagnosis not present

## 2016-10-27 DIAGNOSIS — I25700 Atherosclerosis of coronary artery bypass graft(s), unspecified, with unstable angina pectoris: Secondary | ICD-10-CM | POA: Diagnosis not present

## 2016-10-27 DIAGNOSIS — R5383 Other fatigue: Secondary | ICD-10-CM | POA: Diagnosis not present

## 2016-10-27 DIAGNOSIS — I10 Essential (primary) hypertension: Secondary | ICD-10-CM | POA: Diagnosis not present

## 2016-10-27 DIAGNOSIS — R0609 Other forms of dyspnea: Secondary | ICD-10-CM

## 2016-10-27 DIAGNOSIS — I200 Unstable angina: Secondary | ICD-10-CM

## 2016-10-27 DIAGNOSIS — R0600 Dyspnea, unspecified: Secondary | ICD-10-CM

## 2016-10-27 MED ORDER — CLOPIDOGREL BISULFATE 75 MG PO TABS: 75.0000 mg | ORAL_TABLET | Freq: Every day | ORAL | 3 refills | Status: DC

## 2016-10-27 MED ORDER — ISOSORBIDE MONONITRATE ER 30 MG PO TB24: 30.0000 mg | ORAL_TABLET | Freq: Every day | ORAL | 3 refills | Status: DC

## 2016-10-27 NOTE — Telephone Encounter (Signed)
Rx(s) sent to pharmacy electronically.  

## 2016-10-27 NOTE — Patient Instructions (Signed)
Dr. Rennis Golden recommends isosorbide mononitrate 30mg  once daily - Rx(s) sent to pharmacy electronically.  Your physician recommends that you schedule a follow-up appointment in: ONE MONTH with Dr. Rennis Golden

## 2016-10-27 NOTE — Progress Notes (Signed)
OFFICE NOTE  Chief Complaint:  Follow-up PCI  Primary Care Physician: Theodoro Kos, MD  HPI:  Tamara Nunez  is a 61 year old female with history of coronary disease. She had CABG in 2006, LIMA to LAD, saphenous vein graft to distal LAD. She had chest pain and some fatigue after that in 2009 and had a heart cath which showed widely patent grafts. She has continued to have atypical chest pain which worsens with positioning but seemed to improve with nitroglycerin spray. However, she was given isosorbide which she reports today has not helped her symptoms but reported it had helped her in the past. The main side effect is significant headache on isosorbide which is bothersome for her. She also takes Ranexa. She continues to have chest pain and heaviness mostly on her left side but is also a soreness. She says it is more sore when she pushes on the chest wall underneath the tail of the breast. She had a mammogram which currently was negative for any mass and, again, the symptoms do sound to be more musculoskeletal, possibly a radiculopathy either from the cervical root or perhaps a thoracic nerve that comes around the chest wall. She does have a history of reflux but has not had complaints regarding that and I had recommended a stress test to look for any reversible ischemia that could be causing her symptoms. The stress test performed on June 03, 2012, was low risk demonstrating no significant reversibility. The EKG did show some T-wave changes in 2, 3 and aVF and laterally V2 through V6 with Lexiscan that resolved post infusion. However, this is nonspecific.   She Nunez today with no specific complaints. She occasionally gets chest discomfort from time to time however the symptoms are well managed. She is complaining of some numbness feeling in her fingers and occasionally notes that there is some color change in her fingers in cold weather, which sounds like Raynaud's phenomenon.  I saw  Opal back in the office today. She tells that she's had 2 surgeries since I saw her last. In September she underwent back surgery and has had marked improvement in her pain. About 2-3 weeks ago she had surgery on her left thumb for a trigger finger. She recovered from both of these well. She continues to have minor anginal symptoms on and off which is about baseline for her in the past. She tells me that she's been more fatigued recently and was noted to have mild anemia on laboratory work performed for her surgeries. It was recommended that she go on iron but she has not started that. I've encouraged her to follow-up with her primary care physician for this.  Today for follow-up. She was last seen over a year ago and has recently been ill. She was just discharged from [facility] a few days ago after she developed small bowel obstruction and what sounds a Perforated diverticulum. She underwent exploratory laparotomy and was hospitalized for a period of time. It does not sound like she had any cardiac issues during that procedure. She does have some chronic fatigue complaints and reports she is very fatigued today. She says it's been has been getting progressively worse she does not think is related to her abdominal infection.  10/27/2016  Tamara Nunez today for follow-up. She had noted progressive worsening of shortness of breath and chest discomfort which sound like typical angina and underwent left heart catheterization by Dr. Herbie Baltimore on 09/26/2016. This demonstrated a proximal RCA  lesion that was 80% stenosis. She had successful placement of a 2.5 x 8 mm drug-eluting stent. She was found to have an atretic LIMA to LAD with 100% occlusion of the mid LAD. Her saphenous vein graft to the distal LAD was normal. EF was 55-60%. She reports that she never had any significant improvement in her fatigue and discomfort. She still gets discomfort in the left chest which goes to her back. She said her  husband gave her a "bear hug" and she reported some pain in her mid back. This was reproducible today in the office. She also reports fatigue and discomfort in her chest with exertion that relieves with rest and is improved with sublingual nitroglycerin spray.  PMHx:  Past Medical History:  Diagnosis Date  . Coronary artery disease    a. s/p CABG in 2006 with LIMA-LAD, SVG-dLAD. b. cath in 2009 showing patent grafts. c. 09/2016: cath with occluded LAD but patent SVG-dLAD. LIMA was atretic. pRCA with 80% stenosis --> treated with DES  . Diabetes mellitus without complication (HCC)   . GERD (gastroesophageal reflux disease)   . Hyperlipidemia   . Hypertension     Past Surgical History:  Procedure Laterality Date  . BACK SURGERY    . CARDIAC CATHETERIZATION    . CARDIAC CATHETERIZATION  01/13/2008  . CORONARY ARTERY BYPASS GRAFT  2006   2 vessel - LIMA to LAD (atretic), SVG to distal LAD  . CORONARY ARTERY BYPASS GRAFT  12/23/2004  . CORONARY STENT INTERVENTION  09/26/2016   Prox RCA lesion, 80 %stenosed. DES   . CORONARY STENT INTERVENTION N/A 09/26/2016   Procedure: Coronary Stent Intervention;  Surgeon: Marykay Lex, MD;  Location: [facility] INVASIVE CV LAB;  Service: Cardiovascular;  Laterality: N/A;  . DOPPLER ECHOCARDIOGRAPHY  04/12/2008   EF 60%  . GASTRIC BYPASS  2009  . LAMINECTOMY    . LEFT HEART CATH AND CORS/GRAFTS ANGIOGRAPHY N/A 09/26/2016   Procedure: Left Heart Cath and Cors/Grafts Angiography;  Surgeon: Marykay Lex, MD;  Location: [facility] INVASIVE CV LAB;  Service: Cardiovascular;  Laterality: N/A;  . NM MYOVIEW LTD  06/03/2012   EF 71%    FAMHx:  Family History  Problem Relation Age of Onset  . Heart disease Mother   . Cancer - Lung Father     SOCHx:   reports that she has never smoked. She has never used smokeless tobacco. She reports that she does not drink alcohol or use drugs.  ALLERGIES:  Allergies  Allergen Reactions  . Codeine Hives and Shortness Of  Breath       . Other Anaphylaxis    Almonds   . Ciprofloxacin Itching  . Valsartan Rash  . Adhesive [Tape] Itching    "clear tape" used after surgery."took all her skin off".  Paper take is ok  . Sulfa Antibiotics     nausea    ROS: Pertinent items noted in HPI and remainder of comprehensive ROS otherwise negative.  HOME MEDS: Current Outpatient Prescriptions  Medication Sig Dispense Refill  . acetaminophen (TYLENOL) 500 MG tablet Take 1,000 mg by mouth every 6 (six) hours as needed for moderate pain or headache.    . albuterol (PROVENTIL HFA;VENTOLIN HFA) 108 (90 BASE) MCG/ACT inhaler Inhale 2 puffs into the lungs every 6 (six) hours as needed for wheezing.    Marland Kitchen aspirin EC 81 MG tablet Take 81 mg by mouth daily.    . cetirizine (ZYRTEC) 10 MG tablet Take 10 mg by mouth  as needed for allergies.     . fluticasone (FLONASE) 50 MCG/ACT nasal spray Place 1 spray into both nostrils daily.    . furosemide (LASIX) 20 MG tablet Take 20 mg by mouth daily as needed for edema.     . isosorbide mononitrate (IMDUR) 30 MG 24 hr tablet Take 1 tablet (30 mg total) by mouth daily. 90 tablet 3  . lidocaine (LIDODERM) 5 % Place 2 patches onto the skin daily. Remove & Discard patch within 12 hours or as directed by MD     . metFORMIN (GLUCOPHAGE) 500 MG tablet Take 250 mg by mouth 2 (two) times daily.     . metoprolol tartrate (LOPRESSOR) 25 MG tablet Take 25 mg by mouth 2 (two) times daily.    . Multiple Vitamins-Minerals (MULTIVITAMIN PO) Take 1 tablet by mouth daily.     . nitroGLYCERIN (NITROLINGUAL) 0.4 MG/SPRAY spray Place 1 spray under the tongue every 5 (five) minutes as needed for chest pain. 12 g 1  . pantoprazole (PROTONIX) 40 MG tablet Take 40 mg by mouth daily as needed (acid reflux).     . ramipril (ALTACE) 5 MG capsule Take 5 mg by mouth at bedtime.     . ranolazine (RANEXA) 1000 MG SR tablet Take 1 tablet (1,000 mg total) by mouth 2 (two) times daily. 56 tablet 0  . simvastatin (ZOCOR)  20 MG tablet Take 20 mg by mouth at bedtime.    . vitamin B-12 (CYANOCOBALAMIN) 1000 MCG tablet Take 1,000 mcg by mouth daily.    . vitamin E 400 UNIT capsule Take 400 Units by mouth daily.    . clopidogrel (PLAVIX) 75 MG tablet Take 1 tablet (75 mg total) by mouth daily with breakfast. 90 tablet 3   No current facility-administered medications for this visit.     LABS/IMAGING: No results found for this or any previous visit (from the past 48 hour(s)). No results found.  VITALS: BP 124/68   Pulse 70   Ht 5\' 4"  (1.626 m)   Wt 163 lb (73.9 kg)   BMI 27.98 kg/m   EXAM: General appearance: alert and no distress Neck: no adenopathy, no carotid bruit, no JVD, supple, symmetrical, trachea midline and thyroid not enlarged, symmetric, no tenderness/mass/nodules Lungs: clear to auscultation bilaterally Heart: regular rate and rhythm, S1, S2 normal, no murmur, click, rub or gallop Abdomen: Postsurgical, diffusely tender Extremities: extremities normal, atraumatic, no cyanosis or edema Pulses: 2+ and symmetric Skin: Skin color, texture, turgor normal. No rashes or lesions Neurologic: Grossly normal  EKG: Normal sinus rhythm at 70, possible left atrial artery, RBBB-personally reviewed  ASSESSMENT: 1. Progressive fatigue 2. Recent PCI with DES to the proximal RCA with a 2.5 x 8 mm Synergy DES (09/2016) 3. Coronary artery disease status post CABG (LIMA to LAD-atretic, SVG to distal LAD) 4. Hypertension-controlled 5. Dyslipidemia-on statin 6. Stable angina 7. Diabetes type 2 8. Tingling and coolness of the fingers, concerning for Raynaud's phenomena 9. Recent small bowel obstruction and status post exploratory laparotomy 10. RBBB  PLAN: 1.   Tamara Nunez had recent PCI to the proximal RCA with a good result however she says she's not significantly better. Her LIMA to LAD is atretic and her vein graft to the distal LAD was patent. Some of her symptoms are reproducible as far as her mid  back pain on exam today which may be musculoskeletal, but also she reports shortness of breath and tightness in her chest with exertion that is relieved by  nitroglycerin. Although she has isosorbide on her list, she reports not taking it. It is reasonable to consider starting her on long-acting nitrate therapy. She is already on Ranexa. Will prescribe for isosorbide 30 mg daily. Plan to see her back in about a month to see if her symptoms have improved with this treatment.  Chrystie Nose, MD, [facility] Attending Cardiologist [facility]  Lisette Abu Hilty 10/27/2016, 1:44 PM

## 2016-10-27 NOTE — Telephone Encounter (Signed)
New Message     [facility] needs you to send a new prescription for the Plavix and it needs to state that it is ok to fill with generic

## 2016-11-07 ENCOUNTER — Telehealth: Payer: Self-pay | Admitting: Internal Medicine

## 2016-11-07 NOTE — Telephone Encounter (Signed)
Please call,concerning her nitroglycerin.

## 2016-11-07 NOTE — Telephone Encounter (Signed)
Follow up ° ° °Pt calling back °

## 2016-11-07 NOTE — Telephone Encounter (Signed)
Left msg for patient to call. 

## 2016-11-07 NOTE — Telephone Encounter (Signed)
New message   Pt calling Harrold Donathathan back

## 2016-11-11 NOTE — Telephone Encounter (Signed)
Called back no answer when dialed.

## 2016-11-12 NOTE — Telephone Encounter (Signed)
New Message ° ° pt verbalized that she is returning call for rn °

## 2016-11-12 NOTE — Telephone Encounter (Signed)
Returned call to patient-patient wanted to clarify that her isosorbide was long acting.   Clarified that rx sent to her pharmacy was isosorbide 30mg  24 hour tablet-pt verbalized understanding.

## 2016-11-12 NOTE — Telephone Encounter (Signed)
Left message for pt to call.

## 2016-12-01 ENCOUNTER — Encounter: Payer: Self-pay | Admitting: Internal Medicine

## 2016-12-01 ENCOUNTER — Ambulatory Visit (INDEPENDENT_AMBULATORY_CARE_PROVIDER_SITE_OTHER): Payer: Commercial Managed Care - PPO | Admitting: Internal Medicine

## 2016-12-01 VITALS — BP 117/72 | HR 68 | Ht 64.0 in | Wt 159.8 lb

## 2016-12-01 DIAGNOSIS — I10 Essential (primary) hypertension: Secondary | ICD-10-CM

## 2016-12-01 DIAGNOSIS — Z951 Presence of aortocoronary bypass graft: Secondary | ICD-10-CM | POA: Diagnosis not present

## 2016-12-01 DIAGNOSIS — I208 Other forms of angina pectoris: Secondary | ICD-10-CM

## 2016-12-01 DIAGNOSIS — I2510 Atherosclerotic heart disease of native coronary artery without angina pectoris: Secondary | ICD-10-CM | POA: Diagnosis not present

## 2016-12-01 NOTE — Progress Notes (Signed)
OFFICE NOTE  Chief Complaint:  Follow-up chest pain  Primary Care Physician: Theodoro Kos, MD  HPI:  Tamara Nunez  is a 61 year old female with history of coronary disease. She had CABG in 2006, LIMA to LAD, saphenous vein graft to distal LAD. She had chest pain and some fatigue after that in 2009 and had a heart cath which showed widely patent grafts. She has continued to have atypical chest pain which worsens with positioning but seemed to improve with nitroglycerin spray. However, she was given isosorbide which she reports today has not helped her symptoms but reported it had helped her in the past. The main side effect is significant headache on isosorbide which is bothersome for her. She also takes Ranexa. She continues to have chest pain and heaviness mostly on her left side but is also a soreness. She says it is more sore when she pushes on the chest wall underneath the tail of the breast. She had a mammogram which currently was negative for any mass and, again, the symptoms do sound to be more musculoskeletal, possibly a radiculopathy either from the cervical root or perhaps a thoracic nerve that comes around the chest wall. She does have a history of reflux but has not had complaints regarding that and I had recommended a stress test to look for any reversible ischemia that could be causing her symptoms. The stress test performed on June 03, 2012, was low risk demonstrating no significant reversibility. The EKG did show some T-wave changes in 2, 3 and aVF and laterally V2 through V6 with Lexiscan that resolved post infusion. However, this is nonspecific.   She returns today with no specific complaints. She occasionally gets chest discomfort from time to time however the symptoms are well managed. She is complaining of some numbness feeling in her fingers and occasionally notes that there is some color change in her fingers in cold weather, which sounds like Raynaud's  phenomenon.  I saw Tamara Nunez back in the office today. She tells that she's had 2 surgeries since I saw her last. In September she underwent back surgery and has had marked improvement in her pain. About 2-3 weeks ago she had surgery on her left thumb for a trigger finger. She recovered from both of these well. She continues to have minor anginal symptoms on and off which is about baseline for her in the past. She tells me that she's been more fatigued recently and was noted to have mild anemia on laboratory work performed for her surgeries. It was recommended that she go on iron but she has not started that. I've encouraged her to follow-up with her primary care physician for this.  Today for follow-up. She was last seen over a year ago and has recently been ill. She was just discharged from [facility] a few days ago after she developed small bowel obstruction and what sounds a Perforated diverticulum. She underwent exploratory laparotomy and was hospitalized for a period of time. It does not sound like she had any cardiac issues during that procedure. She does have some chronic fatigue complaints and reports she is very fatigued today. She says it's been has been getting progressively worse she does not think is related to her abdominal infection.  10/27/2016  Tamara Nunez returns today for follow-up. She had noted progressive worsening of shortness of breath and chest discomfort which sound like typical angina and underwent left heart catheterization by Dr. Herbie Baltimore on 09/26/2016. This demonstrated a proximal  RCA lesion that was 80% stenosis. She had successful placement of a 2.5 x 8 mm drug-eluting stent. She was found to have an atretic LIMA to LAD with 100% occlusion of the mid LAD. Her saphenous vein graft to the distal LAD was normal. EF was 55-60%. She reports that she never had any significant improvement in her fatigue and discomfort. She still gets discomfort in the left chest which goes to her  back. She said her husband gave her a "bear hug" and she reported some pain in her mid back. This was reproducible today in the office. She also reports fatigue and discomfort in her chest with exertion that relieves with rest and is improved with sublingual nitroglycerin spray.  12/01/2016  Tamara Nunez returns today for follow-up. I placed her on isosorbide 30 mg daily for recurrent chest discomfort. She still feels some heaviness in her chest however it is improved. She says it's now more bearable and is able to do activities. It has not completely resolved. She has not required any sublingual nitroglycerin spray. She initially had some headache but that is resolved.  PMHx:  Past Medical History:  Diagnosis Date  . Coronary artery disease    a. s/p CABG in 2006 with LIMA-LAD, SVG-dLAD. b. cath in 2009 showing patent grafts. c. 09/2016: cath with occluded LAD but patent SVG-dLAD. LIMA was atretic. pRCA with 80% stenosis --> treated with DES  . Diabetes mellitus without complication (HCC)   . GERD (gastroesophageal reflux disease)   . Hyperlipidemia   . Hypertension     Past Surgical History:  Procedure Laterality Date  . BACK SURGERY    . CARDIAC CATHETERIZATION    . CARDIAC CATHETERIZATION  01/13/2008  . CORONARY ARTERY BYPASS GRAFT  2006   2 vessel - LIMA to LAD (atretic), SVG to distal LAD  . CORONARY ARTERY BYPASS GRAFT  12/23/2004  . CORONARY STENT INTERVENTION  09/26/2016   Prox RCA lesion, 80 %stenosed. DES   . CORONARY STENT INTERVENTION N/A 09/26/2016   Procedure: Coronary Stent Intervention;  Surgeon: Marykay Lex, MD;  Location: [facility] INVASIVE CV LAB;  Service: Cardiovascular;  Laterality: N/A;  . DOPPLER ECHOCARDIOGRAPHY  04/12/2008   EF 60%  . GASTRIC BYPASS  2009  . LAMINECTOMY    . LEFT HEART CATH AND CORS/GRAFTS ANGIOGRAPHY N/A 09/26/2016   Procedure: Left Heart Cath and Cors/Grafts Angiography;  Surgeon: Marykay Lex, MD;  Location: [facility] INVASIVE CV LAB;  Service:  Cardiovascular;  Laterality: N/A;  . NM MYOVIEW LTD  06/03/2012   EF 71%    FAMHx:  Family History  Problem Relation Age of Onset  . Heart disease Mother   . Cancer - Lung Father     SOCHx:   reports that she has never smoked. She has never used smokeless tobacco. She reports that she does not drink alcohol or use drugs.  ALLERGIES:  Allergies  Allergen Reactions  . Codeine Hives and Shortness Of Breath       . Other Anaphylaxis    Almonds   . Ciprofloxacin Itching  . Valsartan Rash  . Adhesive [Tape] Itching    "clear tape" used after surgery."took all her skin off".  Paper take is ok  . Sulfa Antibiotics     nausea    ROS: Pertinent items noted in HPI and remainder of comprehensive ROS otherwise negative.  HOME MEDS: Current Outpatient Prescriptions  Medication Sig Dispense Refill  . acetaminophen (TYLENOL) 500 MG tablet Take 1,000 mg by mouth  every 6 (six) hours as needed for moderate pain or headache.    . albuterol (PROVENTIL HFA;VENTOLIN HFA) 108 (90 BASE) MCG/ACT inhaler Inhale 2 puffs into the lungs every 6 (six) hours as needed for wheezing.    Marland Kitchen aspirin EC 81 MG tablet Take 81 mg by mouth daily.    . cetirizine (ZYRTEC) 10 MG tablet Take 10 mg by mouth as needed for allergies.     Marland Kitchen clopidogrel (PLAVIX) 75 MG tablet Take 1 tablet (75 mg total) by mouth daily with breakfast. 90 tablet 3  . fluticasone (FLONASE) 50 MCG/ACT nasal spray Place 1 spray into both nostrils daily.    . furosemide (LASIX) 20 MG tablet Take 20 mg by mouth daily as needed for edema.     . isosorbide mononitrate (IMDUR) 30 MG 24 hr tablet Take 1 tablet (30 mg total) by mouth daily. 90 tablet 3  . lidocaine (LIDODERM) 5 % Place 2 patches onto the skin daily. Remove & Discard patch within 12 hours or as directed by MD     . metFORMIN (GLUCOPHAGE) 500 MG tablet Take 250 mg by mouth 2 (two) times daily.     . metoprolol tartrate (LOPRESSOR) 25 MG tablet Take 25 mg by mouth 2 (two) times daily.     . Multiple Vitamins-Minerals (MULTIVITAMIN PO) Take 1 tablet by mouth daily.     . nitroGLYCERIN (NITROLINGUAL) 0.4 MG/SPRAY spray Place 1 spray under the tongue every 5 (five) minutes as needed for chest pain. 12 g 1  . pantoprazole (PROTONIX) 40 MG tablet Take 40 mg by mouth daily as needed (acid reflux).     . ramipril (ALTACE) 5 MG capsule Take 5 mg by mouth at bedtime.     . ranolazine (RANEXA) 1000 MG SR tablet Take 1 tablet (1,000 mg total) by mouth 2 (two) times daily. 56 tablet 0  . simvastatin (ZOCOR) 20 MG tablet Take 20 mg by mouth at bedtime.    . vitamin B-12 (CYANOCOBALAMIN) 1000 MCG tablet Take 1,000 mcg by mouth daily.    . vitamin E 400 UNIT capsule Take 400 Units by mouth daily.     No current facility-administered medications for this visit.     LABS/IMAGING: No results found for this or any previous visit (from the past 48 hour(s)). No results found.  VITALS: BP 117/72   Pulse 68   Ht  (1.626 m)   Wt 159 lb 12.8 oz (72.5 kg)   BMI 27.43 kg/m   EXAM: Deferred  EKG: Deferred  ASSESSMENT: 1. Progressive fatigue 2. Recent PCI with DES to the proximal RCA with a 2.5 x 8 mm Synergy DES (09/2016) 3. Coronary artery disease status post CABG (LIMA to LAD-atretic, SVG to distal LAD) 4. Hypertension-controlled 5. Dyslipidemia-on statin 6. Stable angina 7. Diabetes type 2 8. Tingling and coolness of the fingers, concerning for Raynaud's phenomena 9. Recent small bowel obstruction and status post exploratory laparotomy 10. RBBB  PLAN: 1.   Mrs. Desir note some improvement with addition of isosorbide with ongoing chest pressure. She is also on Ranexa. She recently had stenting to the proximal RCA and there are no other clear targets for intervention. We'll plan to continue medical therapy. She is inquiring about radiofrequency ablation to her back for ongoing back pain. This will need to be deferred given her recent stent in July until at least 6 months.  I'll plan to see her back in January which time we can determine if she would  be able to hold her computer go for 5 days prior to an ablation.  Chrystie Nose, MD, [facility] Attending Cardiologist [facility]  Chrystie Nose 12/01/2016, 10:54 AM

## 2016-12-01 NOTE — Patient Instructions (Signed)
Your physician wants you to follow-up in: 6 months with [name]. You will receive a reminder letter in the mail two months in advance. If you don't receive a letter, please call our office to schedule the follow-up appointment.    

## 2016-12-08 ENCOUNTER — Telehealth: Payer: Self-pay | Admitting: Internal Medicine

## 2016-12-08 MED ORDER — ISOSORBIDE MONONITRATE ER 30 MG PO TB24: 60.0000 mg | ORAL_TABLET | Freq: Every day | ORAL | 3 refills | Status: DC

## 2016-12-08 NOTE — Telephone Encounter (Signed)
Ok to increase Imdur to 60 mg daily.  Dr. Rexene Edison

## 2016-12-08 NOTE — Telephone Encounter (Signed)
LM that medication was refilled to pharmacy per MD instructions.

## 2016-12-08 NOTE — Telephone Encounter (Signed)
Returned call to patient. She reports recurrent angina over the weekend. She took imdur 1 and 1/2 tablets daily which helped with her symptoms. She states MD told her he would increase this med if she had recurrent symptoms. Advised that this is a dose change and I would need to defer to MD for approval of this Rx. Prescription to [facility].   Routed to MD

## 2016-12-08 NOTE — Telephone Encounter (Signed)
New Message  Pt call requesting to get a new prescription for isosorbide. Pt states she spoke with RN about increasing to dosage and pt would like to increase. Please call back to discuss

## 2016-12-08 NOTE — Telephone Encounter (Signed)
Follow Up ° ° ° °Returning call from earlier. Please call. °

## 2016-12-08 NOTE — Telephone Encounter (Signed)
[id number] 

## 2016-12-10 DIAGNOSIS — E113291 Type 2 diabetes mellitus with mild nonproliferative diabetic retinopathy without macular edema, right eye: Secondary | ICD-10-CM | POA: Diagnosis not present

## 2016-12-10 DIAGNOSIS — E119 Type 2 diabetes mellitus without complications: Secondary | ICD-10-CM | POA: Diagnosis not present

## 2016-12-10 DIAGNOSIS — H25813 Combined forms of age-related cataract, bilateral: Secondary | ICD-10-CM | POA: Diagnosis not present

## 2016-12-10 DIAGNOSIS — Z7984 Long term (current) use of oral hypoglycemic drugs: Secondary | ICD-10-CM | POA: Diagnosis not present

## 2017-02-02 DIAGNOSIS — Z01419 Encounter for gynecological examination (general) (routine) without abnormal findings: Secondary | ICD-10-CM | POA: Diagnosis not present

## 2017-02-05 ENCOUNTER — Telehealth: Payer: Self-pay

## 2017-02-05 NOTE — Telephone Encounter (Signed)
   [facility] Pre-operative Risk Assessment    Request for surgical clearance:  1. What type of surgery is being performed? Epidural steroid injection  2. When is this surgery scheduled? 03/2017  3. Are there any medications that need to be held prior to surgery and how long?  discontinue plavix for 7 days prior   4. Practice name and name of physician performing surgery?  1. [facility]  2. Dr. Clydell Hakim   5. What is your office phone and fax number?  1. Phone [phone] 2. Fax [phone]  6. Anesthesia type (None, local, MAC, general) ? None specified     _________________________________________________________________   (provider comments below)

## 2017-02-06 NOTE — Telephone Encounter (Signed)
    Chart reviewed as part of pre-operative protocol coverage. S/p stenting most recently in 09/2016. At last OV 12/01/16, Dr Blanchie DessertHilty's note indicates "She is inquiring about radiofrequency ablation to her back for ongoing back pain. This will need to be deferred given her recent stent in July until at least 6 months. I'll plan to see her back in January which time we can determine if she would be able to hold her computer go [sic] for 5 days prior to an ablation."  Based on this information, would not clear at this time to come off Plavix until evaluated in January as outlined above. Do not see appointment scheduled.  Pre-op covering staff: - Please schedule appointment and call patient to inform them. - Please contact requesting surgeon's office via preferred method (i.e, phone, fax) to inform them of need for appointment prior to surgery.  Tamara Montanaayna N Makailah Slavick, PA-C  02/06/2017, 4:55 PM

## 2017-02-09 NOTE — Telephone Encounter (Signed)
Called patient and left message to call back to schedule appointment with Dr. Rennis GoldenHilty for January.

## 2017-02-11 NOTE — Telephone Encounter (Signed)
[facility] called left message with Dr.Harkin's nurse patient cannot be cleared until after she sees Dr.Hilty 03/11/17.

## 2017-02-16 ENCOUNTER — Ambulatory Visit: Payer: Commercial Managed Care - PPO | Admitting: Internal Medicine

## 2017-03-02 DIAGNOSIS — E785 Hyperlipidemia, unspecified: Secondary | ICD-10-CM | POA: Insufficient documentation

## 2017-03-02 DIAGNOSIS — E119 Type 2 diabetes mellitus without complications: Secondary | ICD-10-CM | POA: Insufficient documentation

## 2017-03-02 DIAGNOSIS — E782 Mixed hyperlipidemia: Secondary | ICD-10-CM | POA: Insufficient documentation

## 2017-03-02 DIAGNOSIS — I10 Essential (primary) hypertension: Secondary | ICD-10-CM | POA: Insufficient documentation

## 2017-03-02 DIAGNOSIS — I2510 Atherosclerotic heart disease of native coronary artery without angina pectoris: Secondary | ICD-10-CM | POA: Insufficient documentation

## 2017-03-02 DIAGNOSIS — K219 Gastro-esophageal reflux disease without esophagitis: Secondary | ICD-10-CM | POA: Insufficient documentation

## 2017-03-09 NOTE — Telephone Encounter (Signed)
error 

## 2017-03-11 ENCOUNTER — Encounter: Payer: Self-pay | Admitting: Internal Medicine

## 2017-03-11 ENCOUNTER — Ambulatory Visit (INDEPENDENT_AMBULATORY_CARE_PROVIDER_SITE_OTHER): Payer: Commercial Managed Care - PPO | Admitting: Internal Medicine

## 2017-03-11 VITALS — BP 128/72 | HR 59 | Ht 64.0 in | Wt 161.6 lb

## 2017-03-11 DIAGNOSIS — Z951 Presence of aortocoronary bypass graft: Secondary | ICD-10-CM

## 2017-03-11 DIAGNOSIS — Z01810 Encounter for preprocedural cardiovascular examination: Secondary | ICD-10-CM | POA: Diagnosis not present

## 2017-03-11 DIAGNOSIS — I2089 Other forms of angina pectoris: Secondary | ICD-10-CM

## 2017-03-11 DIAGNOSIS — I209 Angina pectoris, unspecified: Secondary | ICD-10-CM

## 2017-03-11 DIAGNOSIS — I10 Essential (primary) hypertension: Secondary | ICD-10-CM

## 2017-03-11 DIAGNOSIS — I208 Other forms of angina pectoris: Secondary | ICD-10-CM | POA: Diagnosis not present

## 2017-03-11 MED ORDER — ISOSORBIDE MONONITRATE ER 60 MG PO TB24: 60.0000 mg | ORAL_TABLET | Freq: Two times a day (BID) | ORAL | 3 refills | Status: DC

## 2017-03-11 NOTE — Progress Notes (Signed)
OFFICE NOTE  Chief Complaint:  Follow-up angina  Primary Care Physician: Theodoro Kos, MD  HPI:  Tamara Nunez  is a 62 year old female with history of coronary disease. She had CABG in 2006, LIMA to LAD, saphenous vein graft to distal LAD. She had chest pain and some fatigue after that in 2009 and had a heart cath which showed widely patent grafts. She has continued to have atypical chest pain which worsens with positioning but seemed to improve with nitroglycerin spray. However, she was given isosorbide which she reports today has not helped her symptoms but reported it had helped her in the past. The main side effect is significant headache on isosorbide which is bothersome for her. She also takes Ranexa. She continues to have chest pain and heaviness mostly on her left side but is also a soreness. She says it is more sore when she pushes on the chest wall underneath the tail of the breast. She had a mammogram which currently was negative for any mass and, again, the symptoms do sound to be more musculoskeletal, possibly a radiculopathy either from the cervical root or perhaps a thoracic nerve that comes around the chest wall. She does have a history of reflux but has not had complaints regarding that and I had recommended a stress test to look for any reversible ischemia that could be causing her symptoms. The stress test performed on June 03, 2012, was low risk demonstrating no significant reversibility. The EKG did show some T-wave changes in 2, 3 and aVF and laterally V2 through V6 with Lexiscan that resolved post infusion. However, this is nonspecific.   She returns today with no specific complaints. She occasionally gets chest discomfort from time to time however the symptoms are well managed. She is complaining of some numbness feeling in her fingers and occasionally notes that there is some color change in her fingers in cold weather, which sounds like Raynaud's phenomenon.  I  saw Tamara Nunez back in the office today. She tells that she's had 2 surgeries since I saw her last. In September she underwent back surgery and has had marked improvement in her pain. About 2-3 weeks ago she had surgery on her left thumb for a trigger finger. She recovered from both of these well. She continues to have minor anginal symptoms on and off which is about baseline for her in the past. She tells me that she's been more fatigued recently and was noted to have mild anemia on laboratory work performed for her surgeries. It was recommended that she go on iron but she has not started that. I've encouraged her to follow-up with her primary care physician for this.  Today for follow-up. She was last seen over a year ago and has recently been ill. She was just discharged from [facility] a few days ago after she developed small bowel obstruction and what sounds a Perforated diverticulum. She underwent exploratory laparotomy and was hospitalized for a period of time. It does not sound like she had any cardiac issues during that procedure. She does have some chronic fatigue complaints and reports she is very fatigued today. She says it's been has been getting progressively worse she does not think is related to her abdominal infection.  10/27/2016  Tamara Nunez returns today for follow-up. She had noted progressive worsening of shortness of breath and chest discomfort which sound like typical angina and underwent left heart catheterization by Dr. Herbie Baltimore on 09/26/2016. This demonstrated a proximal RCA  lesion that was 80% stenosis. She had successful placement of a 2.5 x 8 mm drug-eluting stent. She was found to have an atretic LIMA to LAD with 100% occlusion of the mid LAD. Her saphenous vein graft to the distal LAD was normal. EF was 55-60%. She reports that she never had any significant improvement in her fatigue and discomfort. She still gets discomfort in the left chest which goes to her back. She said  her husband gave her a "bear hug" and she reported some pain in her mid back. This was reproducible today in the office. She also reports fatigue and discomfort in her chest with exertion that relieves with rest and is improved with sublingual nitroglycerin spray.  12/01/2016  Tamara Nunez returns today for follow-up. I placed her on isosorbide 30 mg daily for recurrent chest discomfort. She still feels some heaviness in her chest however it is improved. She says it's now more bearable and is able to do activities. It has not completely resolved. She has not required any sublingual nitroglycerin spray. She initially had some headache but that is resolved.  03/11/2017  Tamara Nunez was seen today in follow-up.  She still reports symptoms of angina.  She says her isosorbide wears off by about 1 or 2 PM and she has to take another one.  She then notes that it wears off around 7 at night.  She is interested in increasing the dose.  She is compliant with her ranolazine.  He has now been more than 6 months since her stenting.  She is considering radiofrequency ablation of her back for chronic back pain.  In order to accomplish that she need to come off of her Plavix.  PMHx:  Past Medical History:  Diagnosis Date  . Coronary artery disease    a. s/p CABG in 2006 with LIMA-LAD, SVG-dLAD. b. cath in 2009 showing patent grafts. c. 09/2016: cath with occluded LAD but patent SVG-dLAD. LIMA was atretic. pRCA with 80% stenosis --> treated with DES  . Diabetes mellitus without complication (HCC)   . GERD (gastroesophageal reflux disease)   . Hyperlipidemia   . Hypertension     Past Surgical History:  Procedure Laterality Date  . BACK SURGERY    . CARDIAC CATHETERIZATION    . CARDIAC CATHETERIZATION  01/13/2008  . CORONARY ARTERY BYPASS GRAFT  2006   2 vessel - LIMA to LAD (atretic), SVG to distal LAD  . CORONARY ARTERY BYPASS GRAFT  12/23/2004  . CORONARY STENT INTERVENTION  09/26/2016   Prox RCA lesion, 80 %stenosed.  DES   . CORONARY STENT INTERVENTION N/A 09/26/2016   Procedure: Coronary Stent Intervention;  Surgeon: Marykay Lex, MD;  Location: [facility] INVASIVE CV LAB;  Service: Cardiovascular;  Laterality: N/A;  . DOPPLER ECHOCARDIOGRAPHY  04/12/2008   EF 60%  . GASTRIC BYPASS  2009  . LAMINECTOMY    . LEFT HEART CATH AND CORS/GRAFTS ANGIOGRAPHY N/A 09/26/2016   Procedure: Left Heart Cath and Cors/Grafts Angiography;  Surgeon: Marykay Lex, MD;  Location: [facility] INVASIVE CV LAB;  Service: Cardiovascular;  Laterality: N/A;  . NM MYOVIEW LTD  06/03/2012   EF 71%    FAMHx:  Family History  Problem Relation Age of Onset  . Heart disease Mother   . Cancer - Lung Father     SOCHx:   reports that  has never smoked. she has never used smokeless tobacco. She reports that she does not drink alcohol or use drugs.  ALLERGIES:  Allergies  Allergen  Reactions  . Codeine Hives and Shortness Of Breath       . Other Anaphylaxis    Almonds   . Ciprofloxacin Itching  . Valsartan Rash  . Adhesive [Tape] Itching    "clear tape" used after surgery."took all her skin off".  Paper take is ok  . Sulfa Antibiotics     nausea    ROS: Pertinent items noted in HPI and remainder of comprehensive ROS otherwise negative.  HOME MEDS: Current Outpatient Medications  Medication Sig Dispense Refill  . acetaminophen (TYLENOL) 500 MG tablet Take 1,000 mg by mouth every 6 (six) hours as needed for moderate pain or headache.    . albuterol (PROVENTIL HFA;VENTOLIN HFA) 108 (90 BASE) MCG/ACT inhaler Inhale 2 puffs into the lungs every 6 (six) hours as needed for wheezing.    Marland Kitchen. aspirin EC 81 MG tablet Take 81 mg by mouth daily.    . cetirizine (ZYRTEC) 10 MG tablet Take 10 mg by mouth as needed for allergies.     Marland Kitchen. clopidogrel (PLAVIX) 75 MG tablet Take 1 tablet (75 mg total) by mouth daily with breakfast. 90 tablet 3  . fluticasone (FLONASE) 50 MCG/ACT nasal spray Place 1 spray into both nostrils daily.    . furosemide  (LASIX) 20 MG tablet Take 20 mg by mouth daily as needed for edema.     . isosorbide mononitrate (IMDUR) 30 MG 24 hr tablet Take 2 tablets (60 mg total) by mouth daily. 180 tablet 3  . lidocaine (LIDODERM) 5 % Place 2 patches onto the skin daily. Remove & Discard patch within 12 hours or as directed by MD     . metFORMIN (GLUCOPHAGE) 500 MG tablet Take 250 mg by mouth 2 (two) times daily.     . metoprolol tartrate (LOPRESSOR) 25 MG tablet Take 25 mg by mouth 2 (two) times daily.    . Multiple Vitamins-Minerals (MULTIVITAMIN PO) Take 1 tablet by mouth daily.     . nitroGLYCERIN (NITROLINGUAL) 0.4 MG/SPRAY spray Place 1 spray under the tongue every 5 (five) minutes as needed for chest pain. 12 g 1  . pantoprazole (PROTONIX) 40 MG tablet Take 40 mg by mouth daily as needed (acid reflux).     . ramipril (ALTACE) 5 MG capsule Take 5 mg by mouth at bedtime.     . ranolazine (RANEXA) 1000 MG SR tablet Take 1 tablet (1,000 mg total) by mouth 2 (two) times daily. 56 tablet 0  . simvastatin (ZOCOR) 20 MG tablet Take 20 mg by mouth at bedtime.    . vitamin B-12 (CYANOCOBALAMIN) 1000 MCG tablet Take 1,000 mcg by mouth daily.    . vitamin E 400 UNIT capsule Take 400 Units by mouth daily.     No current facility-administered medications for this visit.     LABS/IMAGING: No results found for this or any previous visit (from the past 48 hour(s)). No results found.  VITALS: BP 128/72   Pulse (!) 59   Ht 5\' 4"  (1.626 m)   Wt 161 lb 9.6 oz (73.3 kg)   BMI 27.74 kg/m   EXAM: General appearance: alert and no distress Neck: no carotid bruit, no JVD and thyroid not enlarged, symmetric, no tenderness/mass/nodules Lungs: clear to auscultation bilaterally Heart: regular rate and rhythm, S1, S2 normal, no murmur, click, rub or gallop Abdomen: soft, non-tender; bowel sounds normal; no masses,  no organomegaly Extremities: extremities normal, atraumatic, no cyanosis or edema Pulses: 2+ and symmetric Skin:  Skin color, texture, turgor  normal. No rashes or lesions Neurologic: Grossly normal Psych: Pleasant  EKG: Sinus bradycardia, RBBB at 59-personally reviewed  ASSESSMENT: 1. Stable angina 2. Recent PCI with DES to the proximal RCA with a 2.5 x 8 mm Synergy DES (09/2016) 3. Coronary artery disease status post CABG (LIMA to LAD-atretic, SVG to distal LAD) 4. Hypertension-controlled 5. Dyslipidemia-on statin 6. Diabetes type 2 7. Tingling and coolness of the fingers, concerning for Raynaud's phenomena 8. Recent small bowel obstruction and status post exploratory laparotomy 9. RBBB  PLAN: 1.   Mrs. Errickson reports stable anginal symptoms.  She says that her isosorbide may be wearing off quicker than she would like.  She question whether she could be on an increased dose.  I think it is reasonable to consider increasing her isosorbide to 60 mg twice a day.  I explained the pharmacokinetics of the medication and the fact that there really should be no difference between taking the medicine once a day versus 2 or 3 times a day.  Hopefully if we increase the dose will have better efficacy for her.  Is now been 6 months since her drug-eluting stent.  From a cardiac standpoint she could discontinue the clopidogrel to allow her back procedure.  I would prefer she remain on the aspirin but if she needed to hold that she could as well.  She would hold the Plavix for 5 days and aspirin 7 days prior to the procedure.  Restart afterwards.  Follow-up with me in 6 months or sooner if if needed.  Chrystie Nose, MD, [facility], FACP  [place]  [facility]  Medical Director of the [facility] Lipid Disorders &  Cardiovascular Risk Reduction Clinic Attending Cardiologist  Direct Dial: [phone]  Fax: [phone]  Website:  [email or web address] Nicely Quade Ramirez 03/11/2017, 9:24 AM

## 2017-03-11 NOTE — Patient Instructions (Signed)
Your physician has recommended you make the following change in your medication: -- INCREASE isosorbide mononitrate (imdur) to 60mg  twice daily  Your physician wants you to follow-up in: 6 months with Dr. Rennis GoldenHilty. You will receive a reminder letter in the mail two months in advance. If you don't receive a letter, please call our office to schedule the follow-up appointment.

## 2017-03-18 ENCOUNTER — Telehealth: Payer: Self-pay | Admitting: Internal Medicine

## 2017-03-18 NOTE — Telephone Encounter (Signed)
[facility] in regards to if pt has been cleared for surgery. I called and s/w Yasmin who begins to ask me if we received clearance form back 02/04/17. I stated I am not sure since it is not always the same person in Pre-Op. I had stated to her that the pt just saw Dr. Rennis GoldenHilty on 03/11/17 and that I will fax over OV note which he dictates pt can have surgery and has dictated as to how long she will need to hold her Plavix and ASA. Yasmin begins to start telling me how upset the pt is as well as the Careers advisersurgeon. I stated to her that I cannot answer for other people only can answer for myself and that I had already said I was going to fax over information they needed. Yasmin did state the surgeon did ask for the last 2 ov notes. I will fax these to ATTN: Yasmin [phone].

## 2017-03-18 NOTE — Telephone Encounter (Signed)
F/u message  Yasmin from [facility] call to f/u on surgical clearance for pt.

## 2017-04-22 ENCOUNTER — Telehealth: Payer: Self-pay

## 2017-04-22 NOTE — Telephone Encounter (Signed)
     [facility] Pre-operative Risk Assessment    Request for surgical clearance:  1. What type of surgery is being performed? RADIO FREQUENCY INJECTION  2. When is this surgery scheduled? NONE LISTED   3. What type of clearance is required (medical clearance vs. Pharmacy clearance to hold med vs. Both)? BOTH  4. Are there any medications that need to be held prior to surgery and how long? TAKES PLAVIX(7DAYS)  5. Practice name and name of physician performing surgery? [facility]  6. What is your office phone and fax number? [phone] FAX [phone]   7. Anesthesia type (None, local, MAC, general) ? NOT LISTED   Waylan Rocher 04/22/2017, 3:47 PM  _________________________________________________________________   (provider comments below)

## 2017-04-23 NOTE — Telephone Encounter (Signed)
   Primary Cardiologist: Tamara NoseKenneth C Hilty, MD  Chart reviewed as part of pre-operative protocol coverage. Given past medical history and time since last visit, based on ACC/AHA guidelines, Tamara Nunez would be at acceptable risk for the planned procedure without further cardiovascular testing.   Recommendation from Dr. Rennis Nunez during last office visit : "From a cardiac standpoint she could discontinue the clopidogrel to allow her back procedure.  I would prefer she remain on the aspirin but if she needed to hold that she could as well.  She would hold the Plavix for 5 days and aspirin 7 days prior to the procedure.  Restart afterwards".  I will route this recommendation to the requesting party via Epic fax function and remove from pre-op pool.  Please call with questions.  Sea GirtBhavinkumar Bryar Nunez, [place] 04/23/2017, 2:01 PM

## 2017-04-24 ENCOUNTER — Telehealth: Payer: Self-pay | Admitting: Internal Medicine

## 2017-04-24 NOTE — Telephone Encounter (Signed)
Did not need this encounter °

## 2017-04-29 ENCOUNTER — Telehealth: Payer: Self-pay | Admitting: Internal Medicine

## 2017-04-29 NOTE — Telephone Encounter (Signed)
Spoke with patient of Dr. Rennis GoldenHilty - h/o CABG, stents 09/2016. He reports chest pain for the past few days. She states last nite she was unable to get comfortable. She has used NTG but has not had consistent relief. She states NTG gives her a headache. She reports pain in her right arm. She has dyspnea with "doing too much" but states she does not exert herself, she reports fatigue.   Advised that patient seek ED eval for acute chest pain, not relieved with prn NTG. Advised that ED will be able to address acute concerns, do EKG, labs, etc. Patient states she will consider ED eval if her symptoms worsen.   She has appointment 04/30/17 with Harriet PhoK. Lawrence, DNP. Will forward to her and MD for review/FYI

## 2017-04-29 NOTE — Telephone Encounter (Signed)
Thank you. If she does not go to ER, will see her tomorrow.

## 2017-04-29 NOTE — Telephone Encounter (Signed)
Pt c/o of Chest Pain: STAT if CP now or developed within 24 hours  1. Are you having CP right now? yes  2. Are you experiencing any other symptoms (ex. SOB, nausea, vomiting, sweating)? No  3. How long have you been experiencing CP? Yesterday  4. Is your CP continuous or coming and going?coming and going  5. Have you taken Nitroglycerin? Yes ?

## 2017-04-30 ENCOUNTER — Ambulatory Visit (INDEPENDENT_AMBULATORY_CARE_PROVIDER_SITE_OTHER): Payer: Commercial Managed Care - PPO | Admitting: Adult Health

## 2017-04-30 ENCOUNTER — Encounter: Payer: Self-pay | Admitting: Adult Health

## 2017-04-30 VITALS — BP 118/74 | HR 60 | Ht 64.0 in | Wt 163.6 lb

## 2017-04-30 DIAGNOSIS — I200 Unstable angina: Secondary | ICD-10-CM

## 2017-04-30 DIAGNOSIS — I2510 Atherosclerotic heart disease of native coronary artery without angina pectoris: Secondary | ICD-10-CM

## 2017-04-30 DIAGNOSIS — Z01810 Encounter for preprocedural cardiovascular examination: Secondary | ICD-10-CM

## 2017-04-30 DIAGNOSIS — R079 Chest pain, unspecified: Secondary | ICD-10-CM | POA: Diagnosis not present

## 2017-04-30 NOTE — H&P (View-Only) (Signed)
Cardiology Office Note   Date:  04/30/2017   ID:  Tamara Nunez, DOB [birth year], MRN [id number]  PCP:  Theodoro KosLewis, [name], MD  Cardiologist:  Dr. Rennis GoldenHilty Chief Complaint  Patient presents with  . Chest Pain  . Shortness of Breath  . Nausea  . Arm Pain    left     History of Present Illness: Tamara Nunez is a 62 y.o. female who presents for ongoing assessment and management of CAD. CABG in 2006, LIMA to LAD, saphenous vein graft to distal LAD. She had chest pain and some fatigue after that in 2009 and had a heart cath which showed widely patent grafts.   She has continued to have atypical chest pain which worsens with positioning but seemed to improve with nitroglycerin spray.She also takes Ranexa. She continues to have chest pain and heaviness mostly on her left side but is also a soreness. She says it is more sore when she pushes on the chest wall underneath of the breast  She was last seen by Dr. Rennis GoldenHilty on 03/11/2017 with continued complaints of chest pain, Isosorbide was wearing off by 1p-2 pm. She continued Ranolazine She is considering radiofrequency ablation of her back for chronic pain. She was cleared to have back procedure and to temporarily discontinue Plavix for 5 days and ASA for 7 days first. Isosorbide was increased to 60 mg   She comes today with worsening chest pain, DOE, Fatigue. "I'm tired of it!" On last office visit with Dr. Rennis GoldenHilty she was offered repeat cardiac cath but she refused this. Now is ready to proceed.   Of note, she has not had back procedure because she did not want to stop her Plavix. She has been medically compliant and states that even with the increased dose of Imdur and addition of Ranexa pain continues.    Past Medical History:  Diagnosis Date  . Coronary artery disease    a. s/p CABG in 2006 with LIMA-LAD, SVG-dLAD. b. cath in 2009 showing patent grafts. c. 09/2016: cath with occluded LAD but patent SVG-dLAD. LIMA was atretic. pRCA with 80% stenosis  --> treated with DES  . Diabetes mellitus without complication (HCC)   . GERD (gastroesophageal reflux disease)   . Hyperlipidemia   . Hypertension     Past Surgical History:  Procedure Laterality Date  . BACK SURGERY    . CARDIAC CATHETERIZATION    . CARDIAC CATHETERIZATION  01/13/2008  . CORONARY ARTERY BYPASS GRAFT  2006   2 vessel - LIMA to LAD (atretic), SVG to distal LAD  . CORONARY ARTERY BYPASS GRAFT  12/23/2004  . CORONARY STENT INTERVENTION  09/26/2016   Prox RCA lesion, 80 %stenosed. DES   . CORONARY STENT INTERVENTION N/A 09/26/2016   Procedure: Coronary Stent Intervention;  Surgeon: Marykay LexHarding, David W, MD;  [facility];  Service: Cardiovascular;  Laterality: N/A;  . DOPPLER ECHOCARDIOGRAPHY  04/12/2008   EF 60%  . GASTRIC BYPASS  2009  . LAMINECTOMY    . LEFT HEART CATH AND CORS/GRAFTS ANGIOGRAPHY N/A 09/26/2016   Procedure: Left Heart Cath and Cors/Grafts Angiography;  Surgeon: Marykay LexHarding, David W, MD;  [facility];  Service: Cardiovascular;  Laterality: N/A;  . NM MYOVIEW LTD  06/03/2012   EF 71%     Current Outpatient Medications  Medication Sig Dispense Refill  . acetaminophen (TYLENOL) 500 MG tablet Take 1,000 mg by mouth every 6 (six) hours as needed for moderate pain or headache.    .Marland Kitchen  albuterol (PROVENTIL HFA;VENTOLIN HFA) 108 (90 BASE) MCG/ACT inhaler Inhale 2 puffs into the lungs every 6 (six) hours as needed for wheezing.    Marland Kitchen aspirin EC 81 MG tablet Take 81 mg by mouth daily.    . cetirizine (ZYRTEC) 10 MG tablet Take 10 mg by mouth as needed for allergies.     Marland Kitchen clopidogrel (PLAVIX) 75 MG tablet Take 1 tablet (75 mg total) by mouth daily with breakfast. 90 tablet 3  . fluticasone (FLONASE) 50 MCG/ACT nasal spray Place 1 spray into both nostrils daily.    . furosemide (LASIX) 20 MG tablet Take 20 mg by mouth daily as needed for edema.     . isosorbide mononitrate (IMDUR) 60 MG 24 hr tablet Take 1 tablet (60 mg total) by mouth 2  (two) times daily. 180 tablet 3  . lidocaine (LIDODERM) 5 % Place 2 patches onto the skin daily. Remove & Discard patch within 12 hours or as directed by MD     . metFORMIN (GLUCOPHAGE) 500 MG tablet Take 250 mg by mouth 2 (two) times daily.     . metoprolol tartrate (LOPRESSOR) 25 MG tablet Take 25 mg by mouth 2 (two) times daily.    . Multiple Vitamins-Minerals (MULTIVITAMIN PO) Take 1 tablet by mouth daily.     . nitroGLYCERIN (NITROLINGUAL) 0.4 MG/SPRAY spray Place 1 spray under the tongue every 5 (five) minutes as needed for chest pain. 12 g 1  . pantoprazole (PROTONIX) 40 MG tablet Take 40 mg by mouth daily as needed (acid reflux).     . ramipril (ALTACE) 5 MG capsule Take 5 mg by mouth at bedtime.     . ranolazine (RANEXA) 1000 MG SR tablet Take 1 tablet (1,000 mg total) by mouth 2 (two) times daily. 56 tablet 0  . simvastatin (ZOCOR) 20 MG tablet Take 20 mg by mouth at bedtime.    . vitamin B-12 (CYANOCOBALAMIN) 1000 MCG tablet Take 1,000 mcg by mouth daily.    . vitamin E 400 UNIT capsule Take 400 Units by mouth daily.     No current facility-administered medications for this visit.     Allergies:   Codeine; Other; Ciprofloxacin; Valsartan; Adhesive [tape]; and Sulfa antibiotics    Social History:  The patient  reports that  has never smoked. she has never used smokeless tobacco. She reports that she does not drink alcohol or use drugs.   Family History:  The patient's family history includes Cancer - Lung in her father; Heart disease in her mother.    ROS: All other systems are reviewed and negative. Unless otherwise mentioned in H&P    PHYSICAL EXAM: VS:  BP 118/74   Pulse 60   Ht 5\' 4"  (1.626 m)   Wt 163 lb 9.6 oz (74.2 kg)   BMI 28.08 kg/m  , BMI Body mass index is 28.08 kg/m. GEN: Well nourished, well developed, in no acute distress  HEENT: normal  Neck: no JVD, carotid bruits, or masses Cardiac: RRR; no murmurs, rubs, or gallops,no edema  Respiratory:  clear to  auscultation bilaterally, normal work of breathing GI: soft, nontender, nondistended, + BS MS: no deformity or atrophy Some reproducible pain with palpation of left chest above the breast, but no pain with ROM, or deep breathing.  Skin: warm and dry, no rash Neuro:  Strength and sensation are intact Psych: euthymic mood, full affect   EKG:  NSR with RBBB. Rate of 60 bpm/  Recent Labs: 09/27/2016: BUN 15; Creatinine,  Ser 0.76; Hemoglobin 10.5; Platelets 272; Potassium 4.2; Sodium 138    Lipid Panel    Component Value Date/Time   CHOL  04/15/2010 0400    129        ATP III CLASSIFICATION:  <200     mg/dL   Desirable  161-096  mg/dL   Borderline High  >=045    mg/dL   High          TRIG 15 04/15/2010 0400   HDL 70 04/15/2010 0400   CHOLHDL 1.8 04/15/2010 0400   VLDL 3 04/15/2010 0400   LDLCALC  04/15/2010 0400    56        Total Cholesterol/HDL:CHD Risk Coronary Heart Disease Risk Table                     Men   Women  1/2 Average Risk   3.4   3.3  Average Risk       5.0   4.4  2 X Average Risk   9.6   7.1  3 X Average Risk  23.4   11.0        Use the calculated Patient Ratio above and the CHD Risk Table to determine the patient's CHD Risk.        ATP III CLASSIFICATION (LDL):  <100     mg/dL   Optimal  409-811  mg/dL   Near or Above                    Optimal  130-159  mg/dL   Borderline  914-782  mg/dL   High  >956     mg/dL   Very High      Wt Readings from Last 3 Encounters:  04/30/17 163 lb 9.6 oz (74.2 kg)  03/11/17 161 lb 9.6 oz (73.3 kg)  12/01/16 159 lb 12.8 oz (72.5 kg)      Other studies Reviewed: Cardiac Cath 09/26/2016  Conclusion     Prox RCA lesion, 80 %stenosed.  A STENT SYNERGY DES 2.50X8 drug eluting stent was successfully placed.  Post intervention, there is a 0% residual stenosis.  _____________________________________  Prox LAD to Mid LAD lesion, 60 %stenosed. Mid LAD lesion, 100 %stenosed.  LIMA graft was visualized by  angiography and atretic - does not reach the heart.  SVG-dLAD: graft was visualized by angiography and is large and anatomically normal.  The left ventricular systolic function is normal.  LV end diastolic pressure is normal.  There is no aortic valve stenosis.  There is no mitral valve regurgitation.  The left ventricular ejection fraction is 55-65% by visual estimate.   Severe 2 vessel disease with occluded LAD but patent vein graft to the distal LAD. There is also a severe focal lesion in the proximal RCA treated with a DES stent. Otherwise no significant disease in the circumflex system and the distal RCA.      ASSESSMENT AND PLAN:  1. Recurrent chest pain with worsening symptoms. She is having more dyspnea and fatigue with chest pain with minimal exertion. Was advised by Dr. Rennis Golden if chest pain worsened with optimal medical therapy she would have repeat cath. She is agreeable to this, when at first she refused. No changes in her medications.   The patient understands that risks include but are not limited to stroke (1 in 1000), death (1 in 1000), kidney failure [usually temporary] (1 in 500), bleeding (1 in 200), allergic reaction [possibly serious] (1 in 200),  and agrees to proceed.   Planned for 05/06/2017 with Dr. Clifton James.   2. Hypertension: BP is well controlled currently. No changes in regimen at this time.   3.Hyperlipidemia:  Continue statin therapy.   4. Diabetes: Continue with PCP for control. Consider SGLT-2 inhibitor for cardioprotection.   Current medicines are reviewed at length with the patient today.    Labs/ tests ordered today include: Pre-Cath orders.   Bettey Mare. Liborio Nixon, ANP, AACC   04/30/2017 1:02 PM    [facility] [address], [place], [place] [address] Phone: [phone]; Fax: [phone]

## 2017-04-30 NOTE — Patient Instructions (Signed)
Medication Instructions:  NO CHANGES-Your physician recommends that you continue on your current medications as directed. Please refer to the Current Medication list given to you today.  If you need a refill on your cardiac medications before your next appointment, please call your pharmacy.  Labwork: CBC, PT/INR AND BMET TODAY HERE IN OUR OFFICE AT [facility]  Testing/Procedures: Your physician has requested that you have a cardiac catheterization. Cardiac catheterization is used to diagnose and/or treat various heart conditions. Doctors may recommend this procedure for a number of different reasons. The most common reason is to evaluate chest pain. Chest pain can be a symptom of coronary artery disease (CAD), and cardiac catheterization can show whether plaque is narrowing or blocking your heart's arteries. This procedure is also used to evaluate the valves, as well as measure the blood flow and oxygen levels in different parts of your heart. For further information please visit [email or web address]. Please follow instruction sheet, as given.  Follow-Up: Your physician wants you to follow-up in: AFTER CATH PROCEDURE.   Thank you for choosing [facility]!!      [facility] [address] [place] [address] Dept: [phone] Loc: [phone]  Derwood KaplanWanda O Frett  04/30/2017  You are scheduled for a Cardiac Catheterization on Wednesday, February 27 with Dr. Verne Carrowhristopher McAlhany.  1. Please [facility] at [facility]: [address] [place], [place] [address] at 9:00 AM (two hours before your procedure to ensure your preparation). Free valet parking service is available.   Special note: Every effort is made to have your procedure done on time. Please understand that emergencies sometimes delay scheduled procedures.  2. Diet: Do not eat or drink  anything after midnight prior to your procedure except sips of water to take medications.  3. Labs: You will need to have blood drawn on Thursday, February 21 at [facility] [address], [place] Open: 8am - 5pm (Lunch 12:30 - 1:30)   Phone: [phone]. You do not need to be fasting.  4. Medication instructions in preparation for your procedure:  On the morning of your procedure, take your Aspirin and all your morning medicines.  You may use only sips of water.  5. Plan for one night stay--bring personal belongings. 6. Bring a current list of your medications and current insurance cards. 7. You MUST have a responsible person to drive you home. 8. Someone MUST be with you the first 24 hours after you arrive home or your discharge will be delayed. 9. Please wear clothes that are easy to get on and off and wear slip-on shoes.  Thank you for allowing us to care for you!   -- [facility]

## 2017-04-30 NOTE — Progress Notes (Signed)
Cardiology Office Note   Date:  04/30/2017   ID:  Tamara Nunez, DOB 01/08/1956, MRN [id number]  PCP:  Nunez, Tamara B, MD  Cardiologist:  [name] Chief Complaint  Patient presents with  . Chest Pain  . Shortness of Breath  . Nausea  . Arm Pain    left     History of Present Illness: Tamara Nunez is a 61 y.o. female who presents for ongoing assessment and management of CAD. CABG in 2006, LIMA to LAD, saphenous vein graft to distal LAD. She had chest pain and some fatigue after that in 2009 and had a heart cath which showed widely patent grafts.   She has continued to have atypical chest pain which worsens with positioning but seemed to improve with nitroglycerin spray.She also takes Ranexa. She continues to have chest pain and heaviness mostly on her left side but is also a soreness. She says it is more sore when she pushes on the chest wall underneath of the breast  She was last seen by [name] on 03/11/2017 with continued complaints of chest pain, Isosorbide was wearing off by 1p-2 pm. She continued Ranolazine She is considering radiofrequency ablation of her back for chronic pain. She was cleared to have back procedure and to temporarily discontinue Plavix for 5 days and ASA for 7 days first. Isosorbide was increased to 60 mg   She comes today with worsening chest pain, DOE, Fatigue. "I'm tired of it!" On last office visit with [name] she was offered repeat cardiac cath but she refused this. Now is ready to proceed.   Of note, she has not had back procedure because she did not want to stop her Plavix. She has been medically compliant and states that even with the increased dose of Imdur and addition of Ranexa pain continues.    Past Medical History:  Diagnosis Date  . Coronary artery disease    a. s/p CABG in 2006 with LIMA-LAD, SVG-dLAD. b. cath in 2009 showing patent grafts. c. 09/2016: cath with occluded LAD but patent SVG-dLAD. LIMA was atretic. pRCA with 80% stenosis  --> treated with DES  . Diabetes mellitus without complication (HCC)   . GERD (gastroesophageal reflux disease)   . Hyperlipidemia   . Hypertension     Past Surgical History:  Procedure Laterality Date  . BACK SURGERY    . CARDIAC CATHETERIZATION    . CARDIAC CATHETERIZATION  01/13/2008  . CORONARY ARTERY BYPASS GRAFT  2006   2 vessel - LIMA to LAD (atretic), SVG to distal LAD  . CORONARY ARTERY BYPASS GRAFT  12/23/2004  . CORONARY STENT INTERVENTION  09/26/2016   Prox RCA lesion, 80 %stenosed. DES   . CORONARY STENT INTERVENTION N/A 09/26/2016   Procedure: Coronary Stent Intervention;  Surgeon: Harding, David W, MD;  [facility]: [facility];  Service: Cardiovascular;  Laterality: N/A;  . DOPPLER ECHOCARDIOGRAPHY  04/12/2008   EF 60%  . GASTRIC BYPASS  2009  . LAMINECTOMY    . LEFT HEART CATH AND CORS/GRAFTS ANGIOGRAPHY N/A 09/26/2016   Procedure: Left Heart Cath and Cors/Grafts Angiography;  Surgeon: Harding, David W, MD;  Location: [facility];  Service: Cardiovascular;  Laterality: N/A;  . NM MYOVIEW LTD  06/03/2012   EF 71%     Current Outpatient Medications  Medication Sig Dispense Refill  . acetaminophen (TYLENOL) 500 MG tablet Take 1,000 mg by mouth every 6 (six) hours as needed for moderate pain or headache.    .   albuterol (PROVENTIL HFA;VENTOLIN HFA) 108 (90 BASE) MCG/ACT inhaler Inhale 2 puffs into the lungs every 6 (six) hours as needed for wheezing.    . aspirin EC 81 MG tablet Take 81 mg by mouth daily.    . cetirizine (ZYRTEC) 10 MG tablet Take 10 mg by mouth as needed for allergies.     . clopidogrel (PLAVIX) 75 MG tablet Take 1 tablet (75 mg total) by mouth daily with breakfast. 90 tablet 3  . fluticasone (FLONASE) 50 MCG/ACT nasal spray Place 1 spray into both nostrils daily.    . furosemide (LASIX) 20 MG tablet Take 20 mg by mouth daily as needed for edema.     . isosorbide mononitrate (IMDUR) 60 MG 24 hr tablet Take 1 tablet (60 mg total) by mouth 2  (two) times daily. 180 tablet 3  . lidocaine (LIDODERM) 5 % Place 2 patches onto the skin daily. Remove & Discard patch within 12 hours or as directed by MD     . metFORMIN (GLUCOPHAGE) 500 MG tablet Take 250 mg by mouth 2 (two) times daily.     . metoprolol tartrate (LOPRESSOR) 25 MG tablet Take 25 mg by mouth 2 (two) times daily.    . Multiple Vitamins-Minerals (MULTIVITAMIN PO) Take 1 tablet by mouth daily.     . nitroGLYCERIN (NITROLINGUAL) 0.4 MG/SPRAY spray Place 1 spray under the tongue every 5 (five) minutes as needed for chest pain. 12 g 1  . pantoprazole (PROTONIX) 40 MG tablet Take 40 mg by mouth daily as needed (acid reflux).     . ramipril (ALTACE) 5 MG capsule Take 5 mg by mouth at bedtime.     . ranolazine (RANEXA) 1000 MG SR tablet Take 1 tablet (1,000 mg total) by mouth 2 (two) times daily. 56 tablet 0  . simvastatin (ZOCOR) 20 MG tablet Take 20 mg by mouth at bedtime.    . vitamin B-12 (CYANOCOBALAMIN) 1000 MCG tablet Take 1,000 mcg by mouth daily.    . vitamin E 400 UNIT capsule Take 400 Units by mouth daily.     No current facility-administered medications for this visit.     Allergies:   Codeine; Other; Ciprofloxacin; Valsartan; Adhesive [tape]; and Sulfa antibiotics    Social History:  The patient  reports that  has never smoked. she has never used smokeless tobacco. She reports that she does not drink alcohol or use drugs.   Family History:  The patient's family history includes Cancer - Lung in her father; Heart disease in her mother.    ROS: All other systems are reviewed and negative. Unless otherwise mentioned in H&P    PHYSICAL EXAM: VS:  BP 118/74   Pulse 60   Ht 5' 4" (1.626 m)   Wt 163 lb 9.6 oz (74.2 kg)   BMI 28.08 kg/m  , BMI Body mass index is 28.08 kg/m. GEN: Well nourished, well developed, in no acute distress  HEENT: normal  Neck: no JVD, carotid bruits, or masses Cardiac: RRR; no murmurs, rubs, or gallops,no edema  Respiratory:  clear to  auscultation bilaterally, normal work of breathing GI: soft, nontender, nondistended, + BS MS: no deformity or atrophy Some reproducible pain with palpation of left chest above the breast, but no pain with ROM, or deep breathing.  Skin: warm and dry, no rash Neuro:  Strength and sensation are intact Psych: euthymic mood, full affect   EKG:  NSR with RBBB. Rate of 60 bpm/  Recent Labs: 09/27/2016: BUN 15; Creatinine,   Ser 0.76; Hemoglobin 10.5; Platelets 272; Potassium 4.2; Sodium 138    Lipid Panel    Component Value Date/Time   CHOL  04/15/2010 0400    129        ATP III CLASSIFICATION:  <200     mg/dL   Desirable  200-239  mg/dL   Borderline High  >=240    mg/dL   High          TRIG 15 04/15/2010 0400   HDL 70 04/15/2010 0400   CHOLHDL 1.8 04/15/2010 0400   VLDL 3 04/15/2010 0400   LDLCALC  04/15/2010 0400    56        Total Cholesterol/HDL:CHD Risk Coronary Heart Disease Risk Table                     Men   Women  1/2 Average Risk   3.4   3.3  Average Risk       5.0   4.4  2 X Average Risk   9.6   7.1  3 X Average Risk  23.4   11.0        Use the calculated Patient Ratio above and the CHD Risk Table to determine the patient's CHD Risk.        ATP III CLASSIFICATION (LDL):  <100     mg/dL   Optimal  100-129  mg/dL   Near or Above                    Optimal  130-159  mg/dL   Borderline  160-189  mg/dL   High  >190     mg/dL   Very High      Wt Readings from Last 3 Encounters:  04/30/17 163 lb 9.6 oz (74.2 kg)  03/11/17 161 lb 9.6 oz (73.3 kg)  12/01/16 159 lb 12.8 oz (72.5 kg)      Other studies Reviewed: Cardiac Cath 09/26/2016  Conclusion     Prox RCA lesion, 80 %stenosed.  A STENT SYNERGY DES 2.50X8 drug eluting stent was successfully placed.  Post intervention, there is a 0% residual stenosis.  _____________________________________  Prox LAD to Mid LAD lesion, 60 %stenosed. Mid LAD lesion, 100 %stenosed.  LIMA graft was visualized by  angiography and atretic - does not reach the heart.  SVG-dLAD: graft was visualized by angiography and is large and anatomically normal.  The left ventricular systolic function is normal.  LV end diastolic pressure is normal.  There is no aortic valve stenosis.  There is no mitral valve regurgitation.  The left ventricular ejection fraction is 55-65% by visual estimate.   Severe 2 vessel disease with occluded LAD but patent vein graft to the distal LAD. There is also a severe focal lesion in the proximal RCA treated with a DES stent. Otherwise no significant disease in the circumflex system and the distal RCA.      ASSESSMENT AND PLAN:  1. Recurrent chest pain with worsening symptoms. She is having more dyspnea and fatigue with chest pain with minimal exertion. Was advised by [name] if chest pain worsened with optimal medical therapy she would have repeat cath. She is agreeable to this, when at first she refused. No changes in her medications.   The patient understands that risks include but are not limited to stroke (1 in 1000), death (1 in 1000), kidney failure [usually temporary] (1 in 500), bleeding (1 in 200), allergic reaction [possibly serious] (1 in 200),   and agrees to proceed.   Planned for 05/06/2017 with Dr. McAlhany.   2. Hypertension: BP is well controlled currently. No changes in regimen at this time.   3.Hyperlipidemia:  Continue statin therapy.   4. Diabetes: Continue with PCP for control. Consider SGLT-2 inhibitor for cardioprotection.   Current medicines are reviewed at length with the patient today.    Labs/ tests ordered today include: Pre-Cath orders.   Lavi Sheehan M. Solomia Harrell DNP, ANP, AACC   04/30/2017 1:02 PM    [facility] [address], [place], [place] [address] Phone: [phone]; Fax: [phone] 

## 2017-05-01 LAB — CBC
Hematocrit: 31.1 % — ABNORMAL LOW (ref 34.0–46.6)
Hemoglobin: 10.1 g/dL — ABNORMAL LOW (ref 11.1–15.9)
MCH: 25.6 pg — ABNORMAL LOW (ref 26.6–33.0)
MCHC: 32.5 g/dL (ref 31.5–35.7)
MCV: 79 fL (ref 79–97)
Platelets: 357 10*3/uL (ref 150–379)
RBC: 3.95 x10E6/uL (ref 3.77–5.28)
RDW: 17.2 % — ABNORMAL HIGH (ref 12.3–15.4)
WBC: 5.3 10*3/uL (ref 3.4–10.8)

## 2017-05-01 LAB — BASIC METABOLIC PANEL
BUN/Creatinine Ratio: 14 (ref 12–28)
BUN: 11 mg/dL (ref 8–27)
CO2: 21 mmol/L (ref 20–29)
Calcium: 9.1 mg/dL (ref 8.7–10.3)
Chloride: 107 mmol/L — ABNORMAL HIGH (ref 96–106)
Creatinine, Ser: 0.79 mg/dL (ref 0.57–1.00)
GFR calc Af Amer: 93 mL/min/{1.73_m2} (ref >59)
GFR calc non Af Amer: 80 mL/min/{1.73_m2} (ref >59)
Glucose: 59 mg/dL — ABNORMAL LOW (ref 65–99)
Potassium: 4.6 mmol/L (ref 3.5–5.2)
Sodium: 143 mmol/L (ref 134–144)

## 2017-05-01 LAB — PROTIME-INR
INR: 1 (ref 0.8–1.2)
Prothrombin Time: 10.2 s (ref 9.1–12.0)

## 2017-05-04 ENCOUNTER — Telehealth: Payer: Self-pay | Admitting: *Deleted

## 2017-05-04 NOTE — Telephone Encounter (Signed)
Pt contacted [facility]catheterization scheduled [facility] for: Wednesday February 27,2019 11:30 AM Verified [facility] and [facility] at: 9 AM Nothing to eat or drink after midnight prior to cath. Verified allergies in Epic.  Hold: No metformin 05/05/17, 05/06/17, and 48 hours post cath.  Except hold medications AM meds can be  taken pre-cath with sip of water including: ASA 81 mg AM of cath Plavix 75 mg AM of cath   Confirmed patient has responsible person to drive home post procedure and observe patient for 24 hours: yes

## 2017-05-06 ENCOUNTER — Ambulatory Visit (HOSPITAL_COMMUNITY)
Admission: RE | Admit: 2017-05-06 | Discharge: 2017-05-07 | Disposition: A | Discharge status: Home / Self Care | Payer: Commercial Managed Care - PPO | Source: Ambulatory Visit | Attending: Cardiovascular Disease | Admitting: Cardiovascular Disease

## 2017-05-06 ENCOUNTER — Other Ambulatory Visit: Payer: Self-pay

## 2017-05-06 ENCOUNTER — Encounter (HOSPITAL_COMMUNITY)
Admission: RE | Disposition: A | Discharge status: Home / Self Care | Payer: Self-pay | Source: Ambulatory Visit | Attending: Cardiovascular Disease

## 2017-05-06 ENCOUNTER — Encounter (HOSPITAL_COMMUNITY): Payer: Self-pay | Admitting: Cardiovascular Disease

## 2017-05-06 DIAGNOSIS — K219 Gastro-esophageal reflux disease without esophagitis: Secondary | ICD-10-CM | POA: Insufficient documentation

## 2017-05-06 DIAGNOSIS — E119 Type 2 diabetes mellitus without complications: Secondary | ICD-10-CM | POA: Insufficient documentation

## 2017-05-06 DIAGNOSIS — E785 Hyperlipidemia, unspecified: Secondary | ICD-10-CM | POA: Diagnosis not present

## 2017-05-06 DIAGNOSIS — Z9884 Bariatric surgery status: Secondary | ICD-10-CM | POA: Insufficient documentation

## 2017-05-06 DIAGNOSIS — I2582 Chronic total occlusion of coronary artery: Secondary | ICD-10-CM | POA: Insufficient documentation

## 2017-05-06 DIAGNOSIS — G8929 Other chronic pain: Secondary | ICD-10-CM | POA: Insufficient documentation

## 2017-05-06 DIAGNOSIS — I2510 Atherosclerotic heart disease of native coronary artery without angina pectoris: Secondary | ICD-10-CM

## 2017-05-06 DIAGNOSIS — I25110 Atherosclerotic heart disease of native coronary artery with unstable angina pectoris: Secondary | ICD-10-CM

## 2017-05-06 DIAGNOSIS — Z885 Allergy status to narcotic agent status: Secondary | ICD-10-CM | POA: Insufficient documentation

## 2017-05-06 DIAGNOSIS — Z7982 Long term (current) use of aspirin: Secondary | ICD-10-CM | POA: Insufficient documentation

## 2017-05-06 DIAGNOSIS — Z7951 Long term (current) use of inhaled steroids: Secondary | ICD-10-CM | POA: Insufficient documentation

## 2017-05-06 DIAGNOSIS — Z7902 Long term (current) use of antithrombotics/antiplatelets: Secondary | ICD-10-CM | POA: Diagnosis not present

## 2017-05-06 DIAGNOSIS — Z951 Presence of aortocoronary bypass graft: Secondary | ICD-10-CM | POA: Diagnosis not present

## 2017-05-06 DIAGNOSIS — I200 Unstable angina: Secondary | ICD-10-CM | POA: Diagnosis present

## 2017-05-06 DIAGNOSIS — Z955 Presence of coronary angioplasty implant and graft: Secondary | ICD-10-CM | POA: Diagnosis not present

## 2017-05-06 DIAGNOSIS — Z882 Allergy status to sulfonamides status: Secondary | ICD-10-CM | POA: Diagnosis not present

## 2017-05-06 DIAGNOSIS — Z7984 Long term (current) use of oral hypoglycemic drugs: Secondary | ICD-10-CM | POA: Diagnosis not present

## 2017-05-06 DIAGNOSIS — M545 Low back pain: Secondary | ICD-10-CM | POA: Diagnosis not present

## 2017-05-06 DIAGNOSIS — I10 Essential (primary) hypertension: Secondary | ICD-10-CM | POA: Diagnosis not present

## 2017-05-06 DIAGNOSIS — Z9861 Coronary angioplasty status: Secondary | ICD-10-CM

## 2017-05-06 HISTORY — DX: Dorsalgia, unspecified: M54.9

## 2017-05-06 HISTORY — DX: Other chronic pain: G89.29

## 2017-05-06 HISTORY — DX: Fibromyalgia: M79.7

## 2017-05-06 HISTORY — DX: Unspecified osteoarthritis, unspecified site: M19.90

## 2017-05-06 HISTORY — PX: CORONARY STENT INTERVENTION: CATH118234

## 2017-05-06 HISTORY — DX: Type 2 diabetes mellitus without complications: E11.9

## 2017-05-06 HISTORY — PX: LEFT HEART CATH AND CORONARY ANGIOGRAPHY: CATH118249

## 2017-05-06 LAB — GLUCOSE, CAPILLARY
Glucose-Capillary: 73 mg/dL (ref 65–99)
Glucose-Capillary: 62 mg/dL — ABNORMAL LOW (ref 65–99)
Glucose-Capillary: 88 mg/dL (ref 65–99)
Glucose-Capillary: 68 mg/dL (ref 65–99)
Glucose-Capillary: 144 mg/dL — ABNORMAL HIGH (ref 65–99)
Glucose-Capillary: 83 mg/dL (ref 65–99)

## 2017-05-06 LAB — POCT ACTIVATED CLOTTING TIME: Activated Clotting Time: 665 seconds

## 2017-05-06 SURGERY — LEFT HEART CATH AND CORONARY ANGIOGRAPHY
Anesthesia: LOCAL

## 2017-05-06 MED ORDER — IOPAMIDOL (ISOVUE-370) INJECTION 76%
INTRAVENOUS | Status: AC
  Filled 2017-05-06: qty 150

## 2017-05-06 MED ORDER — SIMVASTATIN 20 MG PO TABS
20.0000 mg | ORAL_TABLET | Freq: Every day | ORAL | Status: DC
  Administered 2017-05-06: 22:00:00 20 mg via ORAL
  Filled 2017-05-06: qty 1

## 2017-05-06 MED ORDER — SODIUM CHLORIDE 0.9% FLUSH: 3.0000 mL | Freq: Two times a day (BID) | INTRAVENOUS | Status: DC

## 2017-05-06 MED ORDER — ASPIRIN EC 81 MG PO TBEC
81.0000 mg | DELAYED_RELEASE_TABLET | Freq: Every day | ORAL | Status: DC
  Administered 2017-05-07: 10:00:00 81 mg via ORAL
  Filled 2017-05-06: qty 1

## 2017-05-06 MED ORDER — ACETAMINOPHEN 325 MG PO TABS
650.0000 mg | ORAL_TABLET | ORAL | Status: DC | PRN
  Administered 2017-05-06: 650 mg via ORAL
  Filled 2017-05-06: qty 2

## 2017-05-06 MED ORDER — ISOSORBIDE MONONITRATE ER 60 MG PO TB24
60.0000 mg | ORAL_TABLET | Freq: Two times a day (BID) | ORAL | Status: DC
  Administered 2017-05-06 – 2017-05-07 (×2): 60 mg via ORAL
  Filled 2017-05-06 (×2): qty 1

## 2017-05-06 MED ORDER — ASPIRIN 81 MG PO CHEW: 81.0000 mg | CHEWABLE_TABLET | ORAL | Status: DC

## 2017-05-06 MED ORDER — SODIUM CHLORIDE 0.9% FLUSH: 3.0000 mL | INTRAVENOUS | Status: DC | PRN

## 2017-05-06 MED ORDER — SODIUM CHLORIDE 0.9 % IV SOLN: 250.0000 mL | INTRAVENOUS | Status: DC | PRN

## 2017-05-06 MED ORDER — FENTANYL CITRATE (PF) 100 MCG/2ML IJ SOLN
INTRAMUSCULAR | Status: AC
  Filled 2017-05-06: qty 2

## 2017-05-06 MED ORDER — RAMIPRIL 5 MG PO CAPS
5.0000 mg | ORAL_CAPSULE | Freq: Every day | ORAL | Status: DC
  Administered 2017-05-07: 5 mg via ORAL
  Filled 2017-05-06: qty 1

## 2017-05-06 MED ORDER — MIDAZOLAM HCL 2 MG/2ML IJ SOLN
INTRAMUSCULAR | Status: AC
  Filled 2017-05-06: qty 2

## 2017-05-06 MED ORDER — CLOPIDOGREL BISULFATE 300 MG PO TABS
ORAL_TABLET | ORAL | Status: DC | PRN
  Administered 2017-05-06: 300 mg via ORAL

## 2017-05-06 MED ORDER — NITROGLYCERIN 1 MG/10 ML FOR IR/CATH LAB
INTRA_ARTERIAL | Status: AC
  Filled 2017-05-06: qty 10

## 2017-05-06 MED ORDER — RANOLAZINE ER 500 MG PO TB12
1000.0000 mg | ORAL_TABLET | Freq: Two times a day (BID) | ORAL | Status: DC
  Administered 2017-05-06 – 2017-05-07 (×2): 1000 mg via ORAL
  Filled 2017-05-06 (×2): qty 2

## 2017-05-06 MED ORDER — CLOPIDOGREL BISULFATE 75 MG PO TABS
75.0000 mg | ORAL_TABLET | Freq: Every day | ORAL | Status: DC
  Administered 2017-05-07: 75 mg via ORAL
  Filled 2017-05-06: qty 1

## 2017-05-06 MED ORDER — LIDOCAINE HCL (PF) 1 % IJ SOLN
INTRAMUSCULAR | Status: DC | PRN
  Administered 2017-05-06: 15 mL

## 2017-05-06 MED ORDER — SODIUM CHLORIDE 0.9 % IV SOLN: INTRAVENOUS | Status: AC

## 2017-05-06 MED ORDER — IOPAMIDOL (ISOVUE-370) INJECTION 76%
INTRAVENOUS | Status: DC | PRN
  Administered 2017-05-06: 185 mL

## 2017-05-06 MED ORDER — HYDRALAZINE HCL 20 MG/ML IJ SOLN: 5.0000 mg | INTRAMUSCULAR | Status: AC | PRN

## 2017-05-06 MED ORDER — NITROGLYCERIN 1 MG/10 ML FOR IR/CATH LAB
INTRA_ARTERIAL | Status: DC | PRN
  Administered 2017-05-06 (×2): 200 ug via INTRACORONARY

## 2017-05-06 MED ORDER — CLOPIDOGREL BISULFATE 75 MG PO TABS
75.0000 mg | ORAL_TABLET | Freq: Once | ORAL | Status: AC
Start: 2017-05-06 — End: 2017-05-06
  Administered 2017-05-06: 75 mg via ORAL

## 2017-05-06 MED ORDER — BIVALIRUDIN BOLUS VIA INFUSION - CUPID
INTRAVENOUS | Status: DC | PRN
  Administered 2017-05-06: 54.45 mg via INTRAVENOUS

## 2017-05-06 MED ORDER — METOPROLOL TARTRATE 25 MG PO TABS
25.0000 mg | ORAL_TABLET | Freq: Two times a day (BID) | ORAL | Status: DC
  Administered 2017-05-06 – 2017-05-07 (×2): 25 mg via ORAL
  Filled 2017-05-06 (×2): qty 1

## 2017-05-06 MED ORDER — SODIUM CHLORIDE 0.9% FLUSH
3.0000 mL | Freq: Two times a day (BID) | INTRAVENOUS | Status: DC
  Administered 2017-05-06: 3 mL via INTRAVENOUS

## 2017-05-06 MED ORDER — NITROGLYCERIN 0.4 MG/SPRAY TL SOLN
1.0000 | Status: DC | PRN
  Filled 2017-05-06: qty 4.9

## 2017-05-06 MED ORDER — CLOPIDOGREL BISULFATE 300 MG PO TABS
ORAL_TABLET | ORAL | Status: AC
  Filled 2017-05-06: qty 1

## 2017-05-06 MED ORDER — LABETALOL HCL 5 MG/ML IV SOLN: 10.0000 mg | INTRAVENOUS | Status: AC | PRN

## 2017-05-06 MED ORDER — CLOPIDOGREL BISULFATE 75 MG PO TABS: 75.0000 mg | ORAL_TABLET | Freq: Every day | ORAL | Status: DC

## 2017-05-06 MED ORDER — FENTANYL CITRATE (PF) 100 MCG/2ML IJ SOLN
INTRAMUSCULAR | Status: DC | PRN
  Administered 2017-05-06 (×3): 25 ug via INTRAVENOUS

## 2017-05-06 MED ORDER — LIDOCAINE HCL (PF) 1 % IJ SOLN
INTRAMUSCULAR | Status: AC
  Filled 2017-05-06: qty 30

## 2017-05-06 MED ORDER — FLUTICASONE PROPIONATE 50 MCG/ACT NA SUSP
1.0000 | Freq: Two times a day (BID) | NASAL | Status: DC
  Administered 2017-05-06 – 2017-05-07 (×2): 1 via NASAL
  Filled 2017-05-06: qty 16

## 2017-05-06 MED ORDER — HEPARIN (PORCINE) IN NACL 2-0.9 UNIT/ML-% IJ SOLN
INTRAMUSCULAR | Status: AC | PRN
  Administered 2017-05-06 (×3): 500 mL

## 2017-05-06 MED ORDER — BIVALIRUDIN TRIFLUOROACETATE 250 MG IV SOLR
INTRAVENOUS | Status: AC
  Filled 2017-05-06: qty 250

## 2017-05-06 MED ORDER — HEPARIN (PORCINE) IN NACL 2-0.9 UNIT/ML-% IJ SOLN
INTRAMUSCULAR | Status: AC
  Filled 2017-05-06: qty 1000

## 2017-05-06 MED ORDER — ALBUTEROL SULFATE (2.5 MG/3ML) 0.083% IN NEBU: 2.5000 mg | INHALATION_SOLUTION | Freq: Four times a day (QID) | RESPIRATORY_TRACT | Status: DC | PRN

## 2017-05-06 MED ORDER — SODIUM CHLORIDE 0.9 % IV SOLN
INTRAVENOUS | Status: DC
  Administered 2017-05-06: 09:00:00 via INTRAVENOUS

## 2017-05-06 MED ORDER — ANGIOPLASTY BOOK
Freq: Once | Status: AC
  Administered 2017-05-06: 22:00:00 1
  Filled 2017-05-06: qty 1

## 2017-05-06 MED ORDER — CLOPIDOGREL BISULFATE 75 MG PO TABS
ORAL_TABLET | ORAL | Status: AC
  Filled 2017-05-06: qty 1

## 2017-05-06 MED ORDER — MIDAZOLAM HCL 2 MG/2ML IJ SOLN
INTRAMUSCULAR | Status: DC | PRN
  Administered 2017-05-06 (×3): 1 mg via INTRAVENOUS

## 2017-05-06 MED ORDER — ONDANSETRON HCL 4 MG/2ML IJ SOLN: 4.0000 mg | Freq: Four times a day (QID) | INTRAMUSCULAR | Status: DC | PRN

## 2017-05-06 MED ORDER — SODIUM CHLORIDE 0.9 % IV SOLN
INTRAVENOUS | Status: DC | PRN
  Administered 2017-05-06: 1.75 mg/kg/h via INTRAVENOUS

## 2017-05-06 SURGICAL SUPPLY — 16 items
BALLN SAPPHIRE 2.5X12 (BALLOONS) ×2
BALLN SAPPHIRE ~~LOC~~ 4.0X8 (BALLOONS) ×1 IMPLANT
BALLOON SAPPHIRE 2.5X12 (BALLOONS) IMPLANT
CATH INFINITI 5FR MULTPACK ANG (CATHETERS) ×1 IMPLANT
CATH VISTA GUIDE 6FR XB3 (CATHETERS) ×1 IMPLANT
KIT ENCORE 26 ADVANTAGE (KITS) ×1 IMPLANT
KIT HEART LEFT (KITS) ×2 IMPLANT
PACK CARDIAC CATHETERIZATION (CUSTOM PROCEDURE TRAY) ×2 IMPLANT
SHEATH PINNACLE 5F 10CM (SHEATH) ×1 IMPLANT
SHEATH PINNACLE 6F 10CM (SHEATH) ×1 IMPLANT
STENT SYNERGY DES 4X12 (Permanent Stent) ×1 IMPLANT
SYR MEDRAD MARK V 150ML (SYRINGE) ×2 IMPLANT
TRANSDUCER W/STOPCOCK (MISCELLANEOUS) ×2 IMPLANT
TUBING CIL FLEX 10 FLL-RA (TUBING) ×2 IMPLANT
WIRE COUGAR XT STRL 190CM (WIRE) ×1 IMPLANT
WIRE EMERALD 3MM-J .035X150CM (WIRE) ×1 IMPLANT

## 2017-05-06 NOTE — Progress Notes (Addendum)
HYPOGLYCEMIC EVENT  CBG: 68  Treatment: 15 GM carbohydrate snack  Symptoms: None  Follow-up CBG: [id number] CBG Result:144  Possible Reasons for Event: Inadequate meal intake  Comments/MD notified:Lindsay Su Hiltoberts PA

## 2017-05-06 NOTE — Research (Signed)
[place] Study Informed Consent   Subject Name: Tamara Nunez  Subject met inclusion and exclusion criteria.  The informed consent form, study requirements and expectations were reviewed with the subject and questions and concerns were addressed prior to the signing of the consent form.  The subject verbalized understanding of the trial requirements.  The subject agreed to participate in the OPTIMIZE trial and signed the informed consent at 0954 on 05/06/2017.  The informed consent was obtained prior to performance of any protocol-specific procedures for the subject.  A copy of the signed informed consent was given to the subject and a copy was placed in the subject's medical record. The subject will be enrolled only if angiographic criteria is met.  Blossom Hoops 05/06/2017, 10:00 AM

## 2017-05-06 NOTE — Progress Notes (Incomplete)
Site area: right groin  Site Prior to Removal:  Level 0  Pressure Applied For 25 MINUTES    Minutes Beginning at 1515  Manual:   {yes no:[id number]}  Patient Status During Pull:  Patient remains A&o by 4, unlabored breathing noted. Tolerating well.   Post Pull Groin Site:  Level 0  Post Pull Instructions Given:  Yes.    Post Pull Pulses Present:  Yes.    Dressing Applied:  Yes.    Comments:  Dressing applied C/D/I, no bleeding no hematoma noted. Patient tolerated well. Post removal instructions provided.

## 2017-05-06 NOTE — Progress Notes (Signed)
HYPOGLYCEMIC EVENT  CBG: 62  Treatment: 15 GM carbohydrate snack  Symptoms: None  Follow-up CBG: Time:1423 CBG Result:88  Possible Reasons for Event: Inadequate meal intake   Comments/MD notified:Lindsay Su Hiltoberts

## 2017-05-06 NOTE — Interval H&P Note (Signed)
History and Physical Interval Note:  05/06/2017 10:42 AM  Tamara KaplanWanda O Eley  has presented today for cardiac cath with the diagnosis of unstable angina. The various methods of treatment have been discussed with the patient and family. After consideration of risks, benefits and other options for treatment, the patient has consented to  Procedure(s): LEFT HEART CATH AND CORONARY ANGIOGRAPHY (N/A) as a surgical intervention .  The patient's history has been reviewed, patient examined, no change in status, stable for surgery.  I have reviewed the patient's chart and labs.  Questions were answered to the patient's satisfaction.    Cath Lab Visit (complete for each Cath Lab visit)  Clinical Evaluation Leading to the Procedure:   ACS: No.  Non-ACS:    Anginal Classification: CCS III  Anti-ischemic medical therapy: Maximal Therapy (2 or more classes of medications)  Non-Invasive Test Results: No non-invasive testing performed  Prior CABG: Previous CABG         Verne Carrowhristopher McAlhany

## 2017-05-07 DIAGNOSIS — I200 Unstable angina: Secondary | ICD-10-CM

## 2017-05-07 DIAGNOSIS — G8929 Other chronic pain: Secondary | ICD-10-CM | POA: Diagnosis not present

## 2017-05-07 DIAGNOSIS — I25110 Atherosclerotic heart disease of native coronary artery with unstable angina pectoris: Secondary | ICD-10-CM | POA: Diagnosis not present

## 2017-05-07 DIAGNOSIS — Z885 Allergy status to narcotic agent status: Secondary | ICD-10-CM | POA: Diagnosis not present

## 2017-05-07 DIAGNOSIS — Z882 Allergy status to sulfonamides status: Secondary | ICD-10-CM | POA: Diagnosis not present

## 2017-05-07 DIAGNOSIS — Z955 Presence of coronary angioplasty implant and graft: Secondary | ICD-10-CM

## 2017-05-07 DIAGNOSIS — Z951 Presence of aortocoronary bypass graft: Secondary | ICD-10-CM | POA: Diagnosis not present

## 2017-05-07 DIAGNOSIS — M545 Low back pain: Secondary | ICD-10-CM | POA: Diagnosis not present

## 2017-05-07 DIAGNOSIS — I2582 Chronic total occlusion of coronary artery: Secondary | ICD-10-CM | POA: Diagnosis not present

## 2017-05-07 DIAGNOSIS — I2510 Atherosclerotic heart disease of native coronary artery without angina pectoris: Secondary | ICD-10-CM

## 2017-05-07 DIAGNOSIS — I10 Essential (primary) hypertension: Secondary | ICD-10-CM | POA: Diagnosis not present

## 2017-05-07 DIAGNOSIS — E119 Type 2 diabetes mellitus without complications: Secondary | ICD-10-CM | POA: Diagnosis not present

## 2017-05-07 DIAGNOSIS — K219 Gastro-esophageal reflux disease without esophagitis: Secondary | ICD-10-CM | POA: Diagnosis not present

## 2017-05-07 DIAGNOSIS — Z9861 Coronary angioplasty status: Secondary | ICD-10-CM

## 2017-05-07 DIAGNOSIS — E785 Hyperlipidemia, unspecified: Secondary | ICD-10-CM | POA: Diagnosis not present

## 2017-05-07 LAB — GLUCOSE, CAPILLARY: Glucose-Capillary: 73 mg/dL (ref 65–99)

## 2017-05-07 LAB — BASIC METABOLIC PANEL
Anion gap: 11 (ref 5–15)
BUN: 13 mg/dL (ref 6–20)
CO2: 21 mmol/L — ABNORMAL LOW (ref 22–32)
Calcium: 8.5 mg/dL — ABNORMAL LOW (ref 8.9–10.3)
Chloride: 107 mmol/L (ref 101–111)
Creatinine, Ser: 0.79 mg/dL (ref 0.44–1.00)
GFR calc Af Amer: >60 mL/min (ref >60)
GFR calc non Af Amer: >60 mL/min (ref >60)
Glucose, Bld: 88 mg/dL (ref 65–99)
Potassium: 4.4 mmol/L (ref 3.5–5.1)
Sodium: 139 mmol/L (ref 135–145)

## 2017-05-07 LAB — CBC
HCT: 29.5 % — ABNORMAL LOW (ref 36.0–46.0)
Hemoglobin: 9.4 g/dL — ABNORMAL LOW (ref 12.0–15.0)
MCH: 25.9 pg — ABNORMAL LOW (ref 26.0–34.0)
MCHC: 31.9 g/dL (ref 30.0–36.0)
MCV: 81.3 fL (ref 78.0–100.0)
Platelets: 287 10*3/uL (ref 150–400)
RBC: 3.63 MIL/uL — ABNORMAL LOW (ref 3.87–5.11)
RDW: 16.9 % — ABNORMAL HIGH (ref 11.5–15.5)
WBC: 6 10*3/uL (ref 4.0–10.5)

## 2017-05-07 MED ORDER — METFORMIN HCL 500 MG PO TABS: 250.0000 mg | ORAL_TABLET | Freq: Two times a day (BID) | ORAL | Status: DC

## 2017-05-07 MED ORDER — CLOPIDOGREL BISULFATE 75 MG PO TABS: 75.0000 mg | ORAL_TABLET | Freq: Every day | ORAL | 3 refills | Status: DC

## 2017-05-07 NOTE — Progress Notes (Signed)
CARDIAC REHAB PHASE I   PRE:  Rate/Rhythm: Sinus Rhythm 82  BP:    Sitting: 116/69     SaO2: 100% Room Air  MODE:  Ambulation: 700 ft   POST:  Rate/Rhythem: 82  BP:    Sitting: 134/66     SaO2: 100% Room Air  [phone] Patient ambulated in the hallway independently without complaints or symptoms. Reviewed risk factors, stent card, heart healthy diabetic diet with the patient and her husband. Discussed use of sublingual nitroglycerin and when to call 911. Mrs Tamara Nunez says she does not think she is interested in participation in phase 2 cardiac rehab. [facility].  Thayer HeadingsMaria Walden Whitaker RN BSN

## 2017-05-07 NOTE — Progress Notes (Signed)
Progress Note  Patient Name: Tamara Nunez Date of Encounter: 05/07/2017  Primary Cardiologist: Chrystie Nose, MD   Subjective   Feeling good today. No chest pain or dyspnea. She had restful sleep last night. She denies any groin pain.   Inpatient Medications    Scheduled Meds: . aspirin EC  81 mg Oral Daily  . clopidogrel  75 mg Oral Q breakfast  . fluticasone  1 spray Each Nare BID  . isosorbide mononitrate  60 mg Oral BID  . metoprolol tartrate  25 mg Oral BID  . ramipril  5 mg Oral Daily  . ranolazine  1,000 mg Oral BID  . simvastatin  20 mg Oral QHS  . sodium chloride flush  3 mL Intravenous Q12H   Continuous Infusions: . sodium chloride     PRN Meds: sodium chloride, acetaminophen, albuterol, nitroGLYCERIN, ondansetron (ZOFRAN) IV, sodium chloride flush   Vital Signs    Vitals:   05/06/17 1545 05/06/17 1918 05/06/17 2000 05/07/17 0554  BP: 118/62 (!) 131/52  (!) 111/59  Pulse: 64 86  76  Resp: 18 14 17 16   Temp:  98.7 F (37.1 C)  98.5 F (36.9 C)  TempSrc:  Oral  Oral  SpO2: 100% 100%  99%  Weight:    160 lb 15 oz (73 kg)  Height:        Intake/Output Summary (Last 24 hours) at 05/07/2017 0751 Last data filed at 05/07/2017 1610 Gross per 24 hour  Intake 1320 ml  Output 1325 ml  Net -5 ml   Filed Weights   05/06/17 0840 05/07/17 0554  Weight: 160 lb (72.6 kg) 160 lb 15 oz (73 kg)    Telemetry    NSR/ sinus tach low 100s - Personally Reviewed  ECG    NSR/RBBB - Personally Reviewed  Physical Exam   GEN: No acute distress.   Neck: No JVD Cardiac: RRR, no murmurs, rubs, or gallops.  Respiratory: Clear to auscultation bilaterally. GI: Soft, nontender, non-distended  MS: No edema; No deformity. Neuro:  Nonfocal  Psych: Normal affect   Labs    Chemistry Recent Labs  Lab 04/30/17 1350 05/07/17 0249  NA 143 139  K 4.6 4.4  CL 107* 107  CO2 21 21*  GLUCOSE 59* 88  BUN 11 13  CREATININE 0.79 0.79  CALCIUM 9.1 8.5*  GFRNONAA  80 >60  GFRAA 93 >60  ANIONGAP  --  11     Hematology Recent Labs  Lab 04/30/17 1350 05/07/17 0249  WBC 5.3 6.0  RBC 3.95 3.63*  HGB 10.1* 9.4*  HCT 31.1* 29.5*  MCV 79 81.3  MCH 25.6* 25.9*  MCHC 32.5 31.9  RDW 17.2* 16.9*  PLT 357 287    Cardiac EnzymesNo results for input(s): TROPONINI in the last 168 hours. No results for input(s): TROPIPOC in the last 168 hours.   BNPNo results for input(s): BNP, PROBNP in the last 168 hours.   DDimer No results for input(s): DDIMER in the last 168 hours.   Radiology    No results found.  Cardiac Studies   Procedures   CORONARY STENT INTERVENTION  LEFT HEART CATH AND CORONARY ANGIOGRAPHY  Conclusion     Ost Cx lesion is 90% stenosed.  Prox LAD to Mid LAD lesion is 100% stenosed.  LIMA.  SVG graft was visualized by angiography and is normal in caliber.  Prox Cx lesion is 20% stenosed.  Mid Cx lesion is 20% stenosed.  Prox RCA lesion is  10% stenosed.  Prox RCA to Mid RCA lesion is 20% stenosed.  A drug-eluting stent was successfully placed using a STENT SYNERGY DES 4X12.  Post intervention, there is a 0% residual stenosis.  The left ventricular systolic function is normal.  LV end diastolic pressure is normal.  The left ventricular ejection fraction is greater than 65% by visual estimate.  There is no mitral valve regurgitation.  Ost 2nd Mrg lesion is 70% stenosed.   1. Triple vessel CAD s/p CABG with one patent bypass graft.  2. The LAD is chronically occluded in the mid segment. The left main has no disease. One diagonal branch is supplied in an antegrade fashion.  3. The LIMA to the LAD is known to be atretic and was not injected. The SVG to the mid LAD is patent.  4. The Circumflex is a large caliber vessel with a severe ostial stenosis.  5. Patent stent proximal RCA 6. Successful PTCA/DES x 1 ostium of the Circumflex artery.  7. Normal LV systolic function  Recommendations: Lifelong DAPT with  ASA and Plavix. Continue beta blocker and statin.      Patient Profile     62 y/o female with h/o CAD s/p CABG in 2006, LIMA to LAD, saphenous vein graft to distal LAD, HTN, HLD, DM and chronic LBP, admitted for elective LHC for unstable angina.   Assessment & Plan    1. CAD/Unstable Angina: LHC yesterday showed Triple vessel CAD s/p CABG with one patent bypass graft.  Compete angiographic details outlined above. Pt underwent successful PTCA/DES x 1 to the ostium of the circumflex artery (severe 90% stenosis). LVEF by cath estimate normal. She is stable today w/o CP. No dyspnea. BP stable. Her baseline resting HR is a little elevated, in the 90s-low 100s. Can further titrate BB. Continue DAPT w/ ASA + Plavix, BB, LA nitrate and statin. D/c home today if no troubles ambulating with cardiac rehab.    2. HTN: controlled on current regimen.   3. HLD: pt has been on simvastatin. No recent FLP on file. Pt has eaten today. Recommend outpatient FLP to monitor LDL and to help guide therapy. If LDL is not at recommended goal of < 70 mg/dL, then she would need readjusting of regimen.    4. DM: hold Metformin 48 hrs post cath.   5. Post Cath: renal function stable post cath. Metformin on hold. Slight drop in Hgb from 10.1>>9.4 Groin ok.   6. Chronic LBP: Pt will have to delay elective back surgery given recent placement of DES and need for uninterrupted treatment with DAPT.   Dispo: likely d/c home. MD to see before d/c. We will arrange post hospital f/u.    For questions or updates, please contact [facility] Please consult [email or web address] for contact info under Cardiology/STEMI.      Signed, Robbie LisBrittainy Savana Spina, PA-C  05/07/2017, 7:51 AM

## 2017-05-07 NOTE — Discharge Summary (Addendum)
Discharge Summary    Patient ID: Tamara Nunez,  MRN: [id number], DOB/AGE: [birth year] 62 y.o.  Admit date: 05/06/2017 Discharge date: 05/07/2017  Primary Care Provider: Theodoro Nunez Primary Cardiologist: Tamara Nose, MD  Discharge Diagnoses    Active Problems:   Unstable angina [facility])   CAD S/P percutaneous coronary angioplasty -DES to ostial Circumflex 05/06/17   Allergies Allergies  Allergen Reactions  . Codeine Hives and Shortness Of Breath       . Other Anaphylaxis    Almonds   . Ciprofloxacin Itching  . Valsartan Rash  . Adhesive [Tape] Itching    "clear tape" used after surgery."took all her skin off".  Paper take is ok  . Sulfa Antibiotics     nausea nausea    Diagnostic Studies/Procedures     Procedures   CORONARY STENT INTERVENTION  LEFT HEART CATH AND CORONARY ANGIOGRAPHY  Conclusion     Ost Cx lesion is 90% stenosed.  Prox LAD to Mid LAD lesion is 100% stenosed.  LIMA.  SVG graft was visualized by angiography and is normal in caliber.  Prox Cx lesion is 20% stenosed.  Mid Cx lesion is 20% stenosed.  Prox RCA lesion is 10% stenosed.  Prox RCA to Mid RCA lesion is 20% stenosed.  A drug-eluting stent was successfully placed using a STENT SYNERGY DES 4X12.  Post intervention, there is a 0% residual stenosis.  The left ventricular systolic function is normal.  LV end diastolic pressure is normal.  The left ventricular ejection fraction is greater than 65% by visual estimate.  There is no mitral valve regurgitation.  Ost 2nd Mrg lesion is 70% stenosed.  1. Triple vessel CAD s/p CABG with one patent bypass graft.  2. The LAD is chronically occluded in the mid segment. The left main has no disease. One diagonal branch is supplied in an antegrade fashion.  3. The LIMA to the LAD is known to be atretic and was not injected. The SVG to the mid LAD is patent.  4. The Circumflex is a large caliber vessel with a severe ostial  stenosis.  5. Patent stent proximal RCA 6. Successful PTCA/DES x 1 ostium of the Circumflex artery.  7. Normal LV systolic function  Recommendations: Lifelong DAPT with ASA and Plavix. Continue beta blocker and statin.      History of Present Illness    62 y/o female with h/o CAD s/p CABG in 2006, LIMA to LAD, saphenous vein graft to distal LAD, HTN, HLD, DM and chronic LBP, admitted for elective LHC for unstable angina.   Pt was seen in clinic on 04/11/17 and complained of exertional CP, dyspnea and fatigue. Outpatient cath was arranged.   Hospital Course     Pt presented to [facility] on 05/06/17 for the planned procedure, performed by Dr. Clifton Nunez. Access was obtained via the right femoral artery. Cath showed triple vessel CAD s/p CABG with one patent bypass graft.  The LAD is chronically occluded in the mid segment. The left main has no disease. One diagonal branch is supplied in an antegrade fashion. The LIMA to the LAD is known to be atretic and was not injected. The SVG to the mid LAD is patent. The Circumflex is a large caliber vessel with a severe ostial stenosis. Patent stent proximal RCA She underwent successful PTCA/DES x 1 to the ostium of the Circumflex artery. LVEF was normal by cath estimate. She left the cath lab in stable condition. She was continued  on DAPT w/ ASA and Plavix. Her home BB and statin were also continued. Metformin was held post cath. She had no post cath complications. Renal function, femoral access site and vital signs remained stable. She ambulated with cardiac rehab h/o difficulty. She was last seen and examined by Dr. Rennis Nunez, who determined she was stable for discharge home. Post hospital f/u will be arranged in 1-2 weeks. Pt will also need a fasting lipid panel in clinic to reassess lipids (no recent LP on file). Simvastatin was continued.   Consultants: none    Discharge Vitals Blood pressure (!) 100/55, pulse 88, temperature 97.6 F (36.4 Nunez), temperature source  Oral, resp. rate 16, height 5\' 4"  (1.626 m), weight 160 lb 15 oz (73 kg), SpO2 100 %.  Filed Weights   05/06/17 0840 05/07/17 0554  Weight: 160 lb (72.6 kg) 160 lb 15 oz (73 kg)    Labs & Radiologic Studies    CBC Recent Labs    05/07/17 0249  WBC 6.0  HGB 9.4*  HCT 29.5*  MCV 81.3  PLT 287   Basic Metabolic Panel Recent Labs    40/98/1102/28/19 0249  NA 139  K 4.4  CL 107  CO2 21*  GLUCOSE 88  BUN 13  CREATININE 0.79  CALCIUM 8.5*   Liver Function Tests No results for input(s): AST, ALT, ALKPHOS, BILITOT, PROT, ALBUMIN in the last 72 hours. No results for input(s): LIPASE, AMYLASE in the last 72 hours. Cardiac Enzymes No results for input(s): CKTOTAL, CKMB, CKMBINDEX, TROPONINI in the last 72 hours. BNP Invalid input(s): POCBNP D-Dimer No results for input(s): DDIMER in the last 72 hours. Hemoglobin A1C No results for input(s): HGBA1C in the last 72 hours. Fasting Lipid Panel No results for input(s): CHOL, HDL, LDLCALC, TRIG, CHOLHDL, LDLDIRECT in the last 72 hours. Thyroid Function Tests No results for input(s): TSH, T4TOTAL, T3FREE, THYROIDAB in the last 72 hours.  Invalid input(s): FREET3 _____________  No results found. Disposition   Pt is being discharged home today in good condition.  Follow-up Plans & Appointments    Follow-up Information    Tamara Nunez, Tamara AbuKenneth C, MD Follow up.   Specialty:  Cardiology Why:  our office will call you with a hospital f/u in 1-2 weeks  Contact information: [address] [place] [id number] [phone]          Discharge Instructions    Amb Referral to Cardiac Rehabilitation   Complete by:  As directed    Diagnosis:   Coronary Stents PTCA     Diet - low sodium heart healthy   Complete by:  As directed    Increase activity slowly   Complete by:  As directed       Discharge Medications   Allergies as of 05/07/2017      Reactions   Codeine Hives, Shortness Of Breath      Other Anaphylaxis     Almonds    Ciprofloxacin Itching   Valsartan Rash   Adhesive [tape] Itching   "clear tape" used after surgery."took all her skin off".  Paper take is ok   Sulfa Antibiotics    nausea nausea      Medication List    TAKE these medications   acetaminophen 500 MG tablet Commonly known as:  TYLENOL Take 1,000 mg by mouth every 6 (six) hours as needed for moderate pain or headache.   albuterol 108 (90 Base) MCG/ACT inhaler Commonly known as:  PROVENTIL HFA;VENTOLIN HFA Inhale 2 puffs  into the lungs every 6 (six) hours as needed for wheezing.   aspirin EC 81 MG tablet Take 81 mg by mouth daily.   cetirizine 10 MG tablet Commonly known as:  ZYRTEC Take 10 mg by mouth as needed for allergies.   clopidogrel 75 MG tablet Commonly known as:  PLAVIX Take 1 tablet (75 mg total) by mouth daily with breakfast.   fluticasone 50 MCG/ACT nasal spray Commonly known as:  FLONASE Place 1 spray into both nostrils 2 (two) times daily.   furosemide 20 MG tablet Commonly known as:  LASIX Take 20 mg by mouth daily as needed for fluid or edema.   isosorbide mononitrate 60 MG 24 hr tablet Commonly known as:  IMDUR Take 1 tablet (60 mg total) by mouth 2 (two) times daily.   lidocaine 5 % Commonly known as:  LIDODERM Place 2 patches onto the skin daily as needed (for pain). Remove & Discard patch within 12 hours or as directed by MD   metFORMIN 500 MG tablet Commonly known as:  GLUCOPHAGE Take 0.5 tablets (250 mg total) by mouth 2 (two) times daily. Start taking on:  05/09/2017 What changed:  These instructions start on 05/09/2017. If you are unsure what to do until then, ask your doctor or other care provider.   metoprolol tartrate 25 MG tablet Commonly known as:  LOPRESSOR Take 25 mg by mouth 2 (two) times daily.   nitroGLYCERIN 0.4 MG/SPRAY spray Commonly known as:  NITROLINGUAL Place 1 spray under the tongue every 5 (five) minutes as needed for chest pain.   pantoprazole 40 MG  tablet Commonly known as:  PROTONIX Take 40 mg by mouth daily as needed (acid reflux).   ramipril 5 MG capsule Commonly known as:  ALTACE Take 5 mg by mouth at bedtime.   ranolazine 1000 MG SR tablet Commonly known as:  RANEXA Take 1 tablet (1,000 mg total) by mouth 2 (two) times daily.   simvastatin 20 MG tablet Commonly known as:  ZOCOR Take 20 mg by mouth at bedtime.   vitamin B-12 1000 MCG tablet Commonly known as:  CYANOCOBALAMIN Take 1,000 mcg by mouth daily.   vitamin E 400 UNIT capsule Take 400 Units by mouth daily.       Aspirin prescribed at discharge?  Yes High Intensity Statin Prescribed? (Lipitor 40-80mg  or Crestor 20-40mg ): Yes Beta Blocker Prescribed? Yes For EF <40%, was ACEI/ARB Prescribed? Yes ADP Receptor Inhibitor Prescribed? (i.e. Plavix etc.-Includes Medically Managed Patients): Yes For EF <40%, Aldosterone Inhibitor Prescribed? No: EF > 40% Was EF assessed during THIS hospitalization? Yes Was Cardiac Rehab II ordered? (Included Medically managed Patients): Yes   Outstanding Labs/Studies   Outpatient Fasting Lipid Panel at time of post hospital f/u.   Duration of Discharge Encounter   Greater than 30 minutes including physician time.  Signed, Robbie Lis PA-Nunez 05/07/2017, 10:24 AM

## 2017-05-10 DIAGNOSIS — T7840XA Allergy, unspecified, initial encounter: Secondary | ICD-10-CM | POA: Diagnosis not present

## 2017-05-25 ENCOUNTER — Encounter: Payer: Self-pay | Admitting: Physician Assistant

## 2017-05-25 ENCOUNTER — Other Ambulatory Visit (HOSPITAL_COMMUNITY)
Admission: RE | Admit: 2017-05-25 | Discharge: 2017-05-25 | Disposition: A | Discharge status: Home / Self Care | Payer: Commercial Managed Care - PPO | Source: Ambulatory Visit | Attending: Physician Assistant | Admitting: Physician Assistant

## 2017-05-25 ENCOUNTER — Ambulatory Visit (INDEPENDENT_AMBULATORY_CARE_PROVIDER_SITE_OTHER): Payer: Commercial Managed Care - PPO | Admitting: Physician Assistant

## 2017-05-25 VITALS — BP 122/74 | HR 55 | Ht 64.0 in | Wt 163.0 lb

## 2017-05-25 DIAGNOSIS — I4510 Unspecified right bundle-branch block: Secondary | ICD-10-CM | POA: Diagnosis not present

## 2017-05-25 DIAGNOSIS — E119 Type 2 diabetes mellitus without complications: Secondary | ICD-10-CM

## 2017-05-25 DIAGNOSIS — I25810 Atherosclerosis of coronary artery bypass graft(s) without angina pectoris: Secondary | ICD-10-CM | POA: Diagnosis not present

## 2017-05-25 DIAGNOSIS — I10 Essential (primary) hypertension: Secondary | ICD-10-CM

## 2017-05-25 DIAGNOSIS — E785 Hyperlipidemia, unspecified: Secondary | ICD-10-CM

## 2017-05-25 DIAGNOSIS — I200 Unstable angina: Secondary | ICD-10-CM

## 2017-05-25 LAB — PLATELET INHIBITION P2Y12: Platelet Function  P2Y12: 181 [PRU] — ABNORMAL LOW (ref 194–418)

## 2017-05-25 NOTE — Progress Notes (Addendum)
Cardiology Office Note    Date:  05/26/2017   ID:  Tamara Nunez, DOB Mar 24, 1955, MRN [id number]  PCP:  Theodoro Kos, MD  Cardiologist:  Dr. Rennis Golden  Chief Complaint  Patient presents with  . Follow-up    seen for Dr. Rennis Golden, post cath    History of Present Illness:  Tamara Nunez is a 62 y.o. female with PMH of CAD s/p CABG 2006 (LIMA to LAD, SVG to distal LAD), HTN, HLD, DM II, and fibromyalgia.  She underwent cardiac catheterization on 09/26/2016 which showed 80% proximal RCA treated with 2.5 x 8 mm Synergy DES, 60% proximal to mid LAD lesion, 100% mid LAD occlusion, atretic LIMA graft, patent SVG to distal LAD.  EF 35-65%.  She was recently seen on 04/30/2017 for recurrent chest pain.  Given persistent symptoms, he ultimately underwent cardiac catheterization on 05/06/2017 which showed 90% ostial left circumflex lesion treated with 4 x 12 mm Synergy DES, atretic LIMA to LAD and patent SVG to mid LAD.   Patient presents today for cardiology office visit.  Her frequency and duration of the chest pain has significantly improved.  She still occasionally has discomfort when she lay on her left side or during exertion.  She does not have significant lower extremity edema, orthopnea or PND.  She think she had recurrent blockage due to the lowering of 325 mg aspirin to 81 mg aspirin.  I told her there is no difference in the efficacy of the 2 doses except 325 mg aspirin is associated with higher probability of a bleeding.  Given the fact that she will need Plavix, I will continue her on 81 mg aspirin.  Although she may try the chewable aspirin for its better absorption.  I will also obtain a P2Y12 inhibition lab as well.  She is due for fasting lipid panel and LFT.  We emphasized on the importance of controlling risk factors such as hypertension, hyperlipidemia and diabetes.  She has significant family history of CAD.  Otherwise she is stable from cardiology perspective today.    Past Medical  History:  Diagnosis Date  . Arthritis    "in my back" (05/06/2017)  . Chronic back pain   . Coronary artery disease    a. s/p CABG in 2006 with LIMA-LAD, SVG-dLAD. b. cath in 2009 showing patent grafts. c. 09/2016: cath with occluded LAD but patent SVG-dLAD. LIMA was atretic. pRCA with 80% stenosis --> treated with DES  . Fibromyalgia   . GERD (gastroesophageal reflux disease)   . Hyperlipidemia   . Hypertension   . Type II diabetes mellitus (HCC)     Past Surgical History:  Procedure Laterality Date  . ABDOMINAL HERNIA REPAIR  2018  . BACK SURGERY    . CARDIAC CATHETERIZATION  01/13/2008  . CORONARY ANGIOPLASTY WITH STENT PLACEMENT  09/26/2016   Prox RCA lesion, 80 %stenosed. DES   . CORONARY ARTERY BYPASS GRAFT  12/23/2004   2 vessel - LIMA to LAD (atretic), SVG to distal LAD  . CORONARY STENT INTERVENTION N/A 09/26/2016   Procedure: Coronary Stent Intervention;  Surgeon: Marykay Lex, MD;  Location: [facility] INVASIVE CV LAB;  Service: Cardiovascular;  Laterality: N/A;  . CORONARY STENT INTERVENTION N/A 05/06/2017   Procedure: CORONARY STENT INTERVENTION;  Surgeon: Kathleene Hazel, MD;  Location: [facility] INVASIVE CV LAB;  Service: Cardiovascular;  Laterality: N/A;  . DOPPLER ECHOCARDIOGRAPHY  04/12/2008   EF 60%  . GASTRIC BYPASS  2009  . HERNIA REPAIR    .  LEFT HEART CATH AND CORONARY ANGIOGRAPHY N/A 05/06/2017   Procedure: LEFT HEART CATH AND CORONARY ANGIOGRAPHY;  Surgeon: Kathleene Hazel, MD;  Location: [facility] INVASIVE CV LAB;  Service: Cardiovascular;  Laterality: N/A;  . LEFT HEART CATH AND CORS/GRAFTS ANGIOGRAPHY N/A 09/26/2016   Procedure: Left Heart Cath and Cors/Grafts Angiography;  Surgeon: Marykay Lex, MD;  Location: [facility] INVASIVE CV LAB;  Service: Cardiovascular;  Laterality: N/A;  . NM MYOVIEW LTD  06/03/2012   EF 71%  . POSTERIOR FUSION LUMBAR SPINE  1990s  . TUBAL LIGATION    . VAGINAL HYSTERECTOMY     "partial"    Current Medications: Outpatient  Medications Prior to Visit  Medication Sig Dispense Refill  . acetaminophen (TYLENOL) 500 MG tablet Take 1,000 mg by mouth every 6 (six) hours as needed for moderate pain or headache.    . albuterol (PROVENTIL HFA;VENTOLIN HFA) 108 (90 BASE) MCG/ACT inhaler Inhale 2 puffs into the lungs every 6 (six) hours as needed for wheezing.    Marland Kitchen aspirin EC 81 MG tablet Take 81 mg by mouth daily.    . cetirizine (ZYRTEC) 10 MG tablet Take 10 mg by mouth as needed for allergies.     Marland Kitchen clopidogrel (PLAVIX) 75 MG tablet Take 1 tablet (75 mg total) by mouth daily with breakfast. 90 tablet 3  . fluticasone (FLONASE) 50 MCG/ACT nasal spray Place 1 spray into both nostrils 2 (two) times daily.     . furosemide (LASIX) 20 MG tablet Take 20 mg by mouth daily as needed for fluid or edema.     . isosorbide mononitrate (IMDUR) 60 MG 24 hr tablet Take 1 tablet (60 mg total) by mouth 2 (two) times daily. 180 tablet 3  . lidocaine (LIDODERM) 5 % Place 2 patches onto the skin daily as needed (for pain). Remove & Discard patch within 12 hours or as directed by MD     . metFORMIN (GLUCOPHAGE) 500 MG tablet Take 0.5 tablets (250 mg total) by mouth 2 (two) times daily.    . metoprolol tartrate (LOPRESSOR) 25 MG tablet Take 25 mg by mouth 2 (two) times daily.    . nitroGLYCERIN (NITROLINGUAL) 0.4 MG/SPRAY spray Place 1 spray under the tongue every 5 (five) minutes as needed for chest pain. 12 g 1  . pantoprazole (PROTONIX) 40 MG tablet Take 40 mg by mouth daily as needed (acid reflux).     . ramipril (ALTACE) 5 MG capsule Take 5 mg by mouth at bedtime.     . ranolazine (RANEXA) 1000 MG SR tablet Take 1 tablet (1,000 mg total) by mouth 2 (two) times daily. 56 tablet 0  . simvastatin (ZOCOR) 20 MG tablet Take 20 mg by mouth at bedtime.    . vitamin B-12 (CYANOCOBALAMIN) 1000 MCG tablet Take 1,000 mcg by mouth daily.    . vitamin E 400 UNIT capsule Take 400 Units by mouth daily.     No facility-administered medications prior to  visit.      Allergies:   Codeine; Other; Ciprofloxacin; Valsartan; Adhesive [tape]; and Sulfa antibiotics   Social History   Socioeconomic History  . Marital status: Married    Spouse name: None  . Number of children: None  . Years of education: None  . Highest education level: None  Social Needs  . Financial resource strain: None  . Food insecurity - worry: None  . Food insecurity - inability: None  . Transportation needs - medical: None  . Transportation needs - non-medical:  None  Occupational History  . None  Tobacco Use  . Smoking status: Never Smoker  . Smokeless tobacco: Never Used  Substance and Sexual Activity  . Alcohol use: No  . Drug use: No  . Sexual activity: None  Other Topics Concern  . None  Social History Narrative  . None     Family History:  The patient's family history includes Cancer - Lung in her father; Heart disease in her mother.   ROS:   Please see the history of present illness.    ROS All other systems reviewed and are negative.   PHYSICAL EXAM:   VS:  BP 122/74   Pulse (!) 55   Ht 5\' 4"  (1.626 m)   Wt 163 lb (73.9 kg)   BMI 27.98 kg/m    GEN: Well nourished, well developed, in no acute distress  HEENT: normal  Neck: no JVD, carotid bruits, or masses Cardiac: RRR; no murmurs, rubs, or gallops,no edema  Respiratory:  clear to auscultation bilaterally, normal work of breathing GI: soft, nontender, nondistended, + BS MS: no deformity or atrophy  Skin: warm and dry, no rash Neuro:  Alert and Oriented x 3, Strength and sensation are intact Psych: euthymic mood, full affect  Wt Readings from Last 3 Encounters:  05/25/17 163 lb (73.9 kg)  05/07/17 160 lb 15 oz (73 kg)  04/30/17 163 lb 9.6 oz (74.2 kg)      Studies/Labs Reviewed:   EKG:  EKG is ordered today.  The ekg ordered today demonstrates normal sinus rhythm with right bundle branch block.  Recent Labs: 05/07/2017: BUN 13; Creatinine, Ser 0.79; Hemoglobin 9.4; Platelets  287; Potassium 4.4; Sodium 139   Lipid Panel    Component Value Date/Time   CHOL  04/15/2010 0400    129        ATP III CLASSIFICATION:  <200     mg/dL   Desirable  [id number]  mg/dL   Borderline High  >=147>=240    mg/dL   High          TRIG 15 04/15/2010 0400   HDL 70 04/15/2010 0400   CHOLHDL 1.8 04/15/2010 0400   VLDL 3 04/15/2010 0400   LDLCALC  04/15/2010 0400    56        Total Cholesterol/HDL:CHD Risk Coronary Heart Disease Risk Table                     Men   Women  1/2 Average Risk   3.4   3.3  Average Risk       5.0   4.4  2 X Average Risk   9.6   7.1  3 X Average Risk  23.4   11.0        Use the calculated Patient Ratio above and the CHD Risk Table to determine the patient's CHD Risk.        ATP III CLASSIFICATION (LDL):  <100     mg/dL   Optimal  [id number]  mg/dL   Near or Above                    Optimal  130-159  mg/dL   Borderline  [id number]  mg/dL   High  >784>190     mg/dL   Very High    Additional studies/ records that were reviewed today include:   Cath 05/06/2017 Conclusion     Ost Cx lesion is 90% stenosed.  Prox LAD to Mid  LAD lesion is 100% stenosed.  LIMA.  SVG graft was visualized by angiography and is normal in caliber.  Prox Cx lesion is 20% stenosed.  Mid Cx lesion is 20% stenosed.  Prox RCA lesion is 10% stenosed.  Prox RCA to Mid RCA lesion is 20% stenosed.  A drug-eluting stent was successfully placed using a STENT SYNERGY DES 4X12.  Post intervention, there is a 0% residual stenosis.  The left ventricular systolic function is normal.  LV end diastolic pressure is normal.  The left ventricular ejection fraction is greater than 65% by visual estimate.  There is no mitral valve regurgitation.  Ost 2nd Mrg lesion is 70% stenosed.   1. Triple vessel CAD s/p CABG with one patent bypass graft.  2. The LAD is chronically occluded in the mid segment. The left main has no disease. One diagonal branch is supplied in an antegrade  fashion.  3. The LIMA to the LAD is known to be atretic and was not injected. The SVG to the mid LAD is patent.  4. The Circumflex is a large caliber vessel with a severe ostial stenosis.  5. Patent stent proximal RCA 6. Successful PTCA/DES x 1 ostium of the Circumflex artery.  7. Normal LV systolic function  Recommendations: Lifelong DAPT with ASA and Plavix. Continue beta blocker and statin.       ASSESSMENT:    1. Coronary artery disease involving coronary bypass graft of native heart without angina pectoris   2. Right bundle branch block   3. Essential hypertension   4. Hyperlipidemia, unspecified hyperlipidemia type   5. Controlled type 2 diabetes mellitus without complication, without long-term current use of insulin (HCC)      PLAN:  In order of problems listed above:  1. CAD s/p CABG: Continue aspirin and statin.  Still has occasional chest discomfort especially when she lay on the left side.  Symptom is quite atypical.  I emphasized on the importance of continue aspirin and Plavix.  I will obtain a P2Y12 study.  2. Hypertension: Pressure stable.  Continue Imdur, ramipril and metoprolol.  3. Hyperlipidemia: On Zocor 20 mg daily.  Obtain fasting lipid panel and LFT at PCPs office  4. DM 2: Managed by primary care provider.    Medication Adjustments/Labs and Tests Ordered: Current medicines are reviewed at length with the patient today.  Concerns regarding medicines are outlined above.  Medication changes, Labs and Tests ordered today are listed in the Patient Instructions below. Patient Instructions  Medication Instructions:  Your physician recommends that you continue on your current medications as directed. Please refer to the Current Medication list given to you today. Ok to switich aspirin to chewable tablet  Labwork: Your physician recommends that you return for lab work in: LIPID, LFT-with PCP;  And complete P2Y12 at the hospital   Testing/Procedures: None     Follow-Up: Your physician recommends that you schedule a follow-up appointment in: 2-3 months with Dr Rennis Golden.  Any Other Special Instructions Will Be Listed Below (If Applicable).  If you need a refill on your cardiac medications before your next appointment, please call your pharmacy.     Ramond Dial, [place]  05/26/2017 10:02 PM    [facility] [address] [place], [place], [place]  [address] Phone: [phone]; Fax: [phone]

## 2017-05-25 NOTE — Patient Instructions (Signed)
Medication Instructions:  Your physician recommends that you continue on your current medications as directed. Please refer to the Current Medication list given to you today. Ok to switich aspirin to chewable tablet  Labwork: Your physician recommends that you return for lab work in: LIPID, LFT-with PCP;  And complete P2Y12 at the hospital   Testing/Procedures: None   Follow-Up: Your physician recommends that you schedule a follow-up appointment in: 2-3 months with Dr Rennis GoldenHilty.  Any Other Special Instructions Will Be Listed Below (If Applicable).  If you need a refill on your cardiac medications before your next appointment, please call your pharmacy.

## 2017-05-26 ENCOUNTER — Encounter: Payer: Self-pay | Admitting: Physician Assistant

## 2017-06-08 ENCOUNTER — Telehealth: Payer: Self-pay | Admitting: Internal Medicine

## 2017-06-08 NOTE — Telephone Encounter (Signed)
[place]ax[place] signed [place] for cardiac [place]hab [place] [facility] [place]

## 2017-06-16 DIAGNOSIS — H20012 Primary iridocyclitis, left eye: Secondary | ICD-10-CM | POA: Diagnosis not present

## 2017-06-26 ENCOUNTER — Telehealth: Payer: Self-pay | Admitting: Internal Medicine

## 2017-06-26 NOTE — Telephone Encounter (Signed)
New message   Pt c/o swelling: STAT is pt has developed SOB within 24 hours  How much weight have you gained and in what time span? N/A 1) If swelling, where is the swelling located? LEGS AND FEET  Are you currently taking a fluid pill? YES 2) Are you currently SOB? YES, WHEN WALKING  3) Do you have a log of your daily weights (if so, list)? N/A  4) Have you gained 3 pounds in a day or 5 pounds in a week? N/A  5) Have you traveled recently? NO

## 2017-06-26 NOTE — Telephone Encounter (Signed)
Returned call to patient of Dr. Rennis GoldenHilty who complains of LE edema in feet and legs. She states yesterday was the 1st time it was this bad - legs hurt, didn't want to stand. She denies weight gain (thought she does not weigh daily), but does report feeling "heavy sometimes". She has PRN lasix and she took this yesterday with some relief. She wears compression stockings. Advised to continue wearing compression stockings, monitoring her salt/sodium intake, take lasix 20mg  today and tomorrow. Suggested she call if a total of 3 days of PRN lasix does not resolve her LE edema or if she develops worsening swelling and/or SOB. Informed her that I would route message to MD to review and advise further

## 2017-06-26 NOTE — Telephone Encounter (Signed)
Agree with advice given MCr 

## 2017-06-26 NOTE — Telephone Encounter (Signed)
Notified patient that MD has reviewed her concerns/symptoms and agreed with previous recommendations.

## 2017-07-27 ENCOUNTER — Encounter: Payer: Self-pay | Admitting: Internal Medicine

## 2017-07-27 ENCOUNTER — Ambulatory Visit (INDEPENDENT_AMBULATORY_CARE_PROVIDER_SITE_OTHER): Payer: Commercial Managed Care - PPO | Admitting: Internal Medicine

## 2017-07-27 VITALS — BP 116/71 | HR 72 | Ht 64.0 in | Wt 164.8 lb

## 2017-07-27 DIAGNOSIS — Z9861 Coronary angioplasty status: Secondary | ICD-10-CM

## 2017-07-27 DIAGNOSIS — I2510 Atherosclerotic heart disease of native coronary artery without angina pectoris: Secondary | ICD-10-CM

## 2017-07-27 DIAGNOSIS — Z951 Presence of aortocoronary bypass graft: Secondary | ICD-10-CM

## 2017-07-27 DIAGNOSIS — I208 Other forms of angina pectoris: Secondary | ICD-10-CM | POA: Diagnosis not present

## 2017-07-27 DIAGNOSIS — I200 Unstable angina: Secondary | ICD-10-CM

## 2017-07-27 MED ORDER — ISOSORBIDE MONONITRATE ER 60 MG PO TB24: 90.0000 mg | ORAL_TABLET | Freq: Two times a day (BID) | ORAL | 3 refills | Status: DC

## 2017-07-27 NOTE — Progress Notes (Signed)
OFFICE NOTE  Chief Complaint:  Follow-up angina  Primary Care Physician: Theodoro Kos, MD  HPI:  MIKAIAH STOFFER  is a 62 year old female with history of coronary disease. She had CABG in 2006, LIMA to LAD, saphenous vein graft to distal LAD. She had chest pain and some fatigue after that in 2009 and had a heart cath which showed widely patent grafts. She has continued to have atypical chest pain which worsens with positioning but seemed to improve with nitroglycerin spray. However, she was given isosorbide which she reports today has not helped her symptoms but reported it had helped her in the past. The main side effect is significant headache on isosorbide which is bothersome for her. She also takes Ranexa. She continues to have chest pain and heaviness mostly on her left side but is also a soreness. She says it is more sore when she pushes on the chest wall underneath the tail of the breast. She had a mammogram which currently was negative for any mass and, again, the symptoms do sound to be more musculoskeletal, possibly a radiculopathy either from the cervical root or perhaps a thoracic nerve that comes around the chest wall. She does have a history of reflux but has not had complaints regarding that and I had recommended a stress test to look for any reversible ischemia that could be causing her symptoms. The stress test performed on June 03, 2012, was low risk demonstrating no significant reversibility. The EKG did show some T-wave changes in 2, 3 and aVF and laterally V2 through V6 with Lexiscan that resolved post infusion. However, this is nonspecific.   She returns today with no specific complaints. She occasionally gets chest discomfort from time to time however the symptoms are well managed. She is complaining of some numbness feeling in her fingers and occasionally notes that there is some color change in her fingers in cold weather, which sounds like Raynaud's phenomenon.  I  saw [name] back in the office today. She tells that she's had 2 surgeries since I saw her last. In September she underwent back surgery and has had marked improvement in her pain. About 2-3 weeks ago she had surgery on her left thumb for a trigger finger. She recovered from both of these well. She continues to have minor anginal symptoms on and off which is about baseline for her in the past. She tells me that she's been more fatigued recently and was noted to have mild anemia on laboratory work performed for her surgeries. It was recommended that she go on iron but she has not started that. I've encouraged her to follow-up with her primary care physician for this.  Today for follow-up. She was last seen over a year ago and has recently been ill. She was just discharged from [facility] a few days ago after she developed small bowel obstruction and what sounds a Perforated diverticulum. She underwent exploratory laparotomy and was hospitalized for a period of time. It does not sound like she had any cardiac issues during that procedure. She does have some chronic fatigue complaints and reports she is very fatigued today. She says it's been has been getting progressively worse she does not think is related to her abdominal infection.  10/27/2016  [name] returns today for follow-up. She had noted progressive worsening of shortness of breath and chest discomfort which sound like typical angina and underwent left heart catheterization by Dr. Herbie Baltimore on 09/26/2016. This demonstrated a proximal RCA  lesion that was 80% stenosis. She had successful placement of a 2.5 x 8 mm drug-eluting stent. She was found to have an atretic LIMA to LAD with 100% occlusion of the mid LAD. Her saphenous vein graft to the distal LAD was normal. EF was 55-60%. She reports that she never had any significant improvement in her fatigue and discomfort. She still gets discomfort in the left chest which goes to her back. She said  her husband gave her a "bear hug" and she reported some pain in her mid back. This was reproducible today in the office. She also reports fatigue and discomfort in her chest with exertion that relieves with rest and is improved with sublingual nitroglycerin spray.  12/01/2016  Roben returns today for follow-up. I placed her on isosorbide 30 mg daily for recurrent chest discomfort. She still feels some heaviness in her chest however it is improved. She says it's now more bearable and is able to do activities. It has not completely resolved. She has not required any sublingual nitroglycerin spray. She initially had some headache but that is resolved.  03/11/2017  Nyleah was seen today in follow-up.  She still reports symptoms of angina.  She says her isosorbide wears off by about 1 or 2 PM and she has to take another one.  She then notes that it wears off around 7 at night.  She is interested in increasing the dose.  She is compliant with her ranolazine.  He has now been more than 6 months since her stenting.  She is considering radiofrequency ablation of her back for chronic back pain.  In order to accomplish that she need to come off of her Plavix.  07/27/2017  Misheel returns today for follow-up.  She continues to have chest discomfort.  She feels like she does not get adequate relief from the isosorbide.  In addition she is on Ranexa.  She says that she used to have benefit from it when she was receiving the name Ranexa however she is not convinced that the generic Ranexa is providing the same benefit.  I explained to her that my knowledge of the medication from a recent interaction with a drug representative is that the company is no longer planning to make a brand-name medication, therefore she will have no other options for treatment.  With regards to isosorbide, I again stated that there is no benefit to taking the medication 3 days versus twice a day.  Perhaps there could be benefit from increase in the  dose.  I am not totally convinced that her symptoms are all angina.  She does have significant thoracic and lumbar spine disease and may be having some pain related to radiculopathy.  She was told to use a lidocaine patch however she does not use that often.  PMHx:  Past Medical History:  Diagnosis Date  . Arthritis    "in my back" (05/06/2017)  . Chronic back pain   . Coronary artery disease    a. s/p CABG in 2006 with LIMA-LAD, SVG-dLAD. b. cath in 2009 showing patent grafts. c. 09/2016: cath with occluded LAD but patent SVG-dLAD. LIMA was atretic. pRCA with 80% stenosis --> treated with DES  . Fibromyalgia   . GERD (gastroesophageal reflux disease)   . Hyperlipidemia   . Hypertension   . Type II diabetes mellitus (HCC)     Past Surgical History:  Procedure Laterality Date  . ABDOMINAL HERNIA REPAIR  2018  . BACK SURGERY    .  CARDIAC CATHETERIZATION  01/13/2008  . CORONARY ANGIOPLASTY WITH STENT PLACEMENT  09/26/2016   Prox RCA lesion, 80 %stenosed. DES   . CORONARY ARTERY BYPASS GRAFT  12/23/2004   2 vessel - LIMA to LAD (atretic), SVG to distal LAD  . CORONARY STENT INTERVENTION N/A 09/26/2016   Procedure: Coronary Stent Intervention;  Surgeon: Marykay Lex, MD;  Location: [facility] INVASIVE CV LAB;  Service: Cardiovascular;  Laterality: N/A;  . CORONARY STENT INTERVENTION N/A 05/06/2017   Procedure: CORONARY STENT INTERVENTION;  Surgeon: Kathleene Hazel, MD;  Location: [facility] INVASIVE CV LAB;  Service: Cardiovascular;  Laterality: N/A;  . DOPPLER ECHOCARDIOGRAPHY  04/12/2008   EF 60%  . GASTRIC BYPASS  2009  . HERNIA REPAIR    . LEFT HEART CATH AND CORONARY ANGIOGRAPHY N/A 05/06/2017   Procedure: LEFT HEART CATH AND CORONARY ANGIOGRAPHY;  Surgeon: Kathleene Hazel, MD;  Location: [facility] INVASIVE CV LAB;  Service: Cardiovascular;  Laterality: N/A;  . LEFT HEART CATH AND CORS/GRAFTS ANGIOGRAPHY N/A 09/26/2016   Procedure: Left Heart Cath and Cors/Grafts Angiography;  Surgeon:  Marykay Lex, MD;  Location: [facility] INVASIVE CV LAB;  Service: Cardiovascular;  Laterality: N/A;  . NM MYOVIEW LTD  06/03/2012   EF 71%  . POSTERIOR FUSION LUMBAR SPINE  1990s  . TUBAL LIGATION    . VAGINAL HYSTERECTOMY     "partial"    FAMHx:  Family History  Problem Relation Age of Onset  . Heart disease Mother   . Cancer - Lung Father     SOCHx:   reports that she has never smoked. She has never used smokeless tobacco. She reports that she does not drink alcohol or use drugs.  ALLERGIES:  Allergies  Allergen Reactions  . Codeine Hives and Shortness Of Breath       . Other Anaphylaxis    Almonds   . Ciprofloxacin Itching  . Valsartan Rash  . Adhesive [Tape] Itching    "clear tape" used after surgery."took all her skin off".  Paper take is ok  . Sulfa Antibiotics     nausea nausea    ROS: Pertinent items noted in HPI and remainder of comprehensive ROS otherwise negative.  HOME MEDS: Current Outpatient Medications  Medication Sig Dispense Refill  . acetaminophen (TYLENOL) 500 MG tablet Take 1,000 mg by mouth every 6 (six) hours as needed for moderate pain or headache.    . albuterol (PROVENTIL HFA;VENTOLIN HFA) 108 (90 BASE) MCG/ACT inhaler Inhale 2 puffs into the lungs every 6 (six) hours as needed for wheezing.    Marland Kitchen aspirin EC 81 MG tablet Take 81 mg by mouth daily.    . cetirizine (ZYRTEC) 10 MG tablet Take 10 mg by mouth as needed for allergies.     Marland Kitchen clopidogrel (PLAVIX) 75 MG tablet Take 1 tablet (75 mg total) by mouth daily with breakfast. 90 tablet 3  . fluticasone (FLONASE) 50 MCG/ACT nasal spray Place 1 spray into both nostrils 2 (two) times daily.     . furosemide (LASIX) 20 MG tablet Take 20 mg by mouth daily as needed for fluid or edema.     . isosorbide mononitrate (IMDUR) 60 MG 24 hr tablet Take 1 tablet (60 mg total) by mouth 2 (two) times daily. 180 tablet 3  . lidocaine (LIDODERM) 5 % Place 2 patches onto the skin daily as needed (for pain). Remove &  Discard patch within 12 hours or as directed by MD     . metFORMIN (GLUCOPHAGE) 500  MG tablet Take 0.5 tablets (250 mg total) by mouth 2 (two) times daily.    . metoprolol tartrate (LOPRESSOR) 25 MG tablet Take 25 mg by mouth 2 (two) times daily.    . nitroGLYCERIN (NITROLINGUAL) 0.4 MG/SPRAY spray Place 1 spray under the tongue every 5 (five) minutes as needed for chest pain. 12 g 1  . pantoprazole (PROTONIX) 40 MG tablet Take 40 mg by mouth daily as needed (acid reflux).     . ramipril (ALTACE) 5 MG capsule Take 5 mg by mouth at bedtime.     . ranolazine (RANEXA) 1000 MG SR tablet Take 1 tablet (1,000 mg total) by mouth 2 (two) times daily. 56 tablet 0  . simvastatin (ZOCOR) 20 MG tablet Take 20 mg by mouth at bedtime.    . vitamin B-12 (CYANOCOBALAMIN) 1000 MCG tablet Take 1,000 mcg by mouth daily.    . vitamin E 400 UNIT capsule Take 400 Units by mouth daily.     No current facility-administered medications for this visit.     LABS/IMAGING: No results found for this or any previous visit (from the past 48 hour(s)). No results found.  VITALS: BP 116/71   Pulse 72   Ht  (1.626 m)   Wt 164 lb 12.8 oz (74.8 kg)   BMI 28.29 kg/m   EXAM: General appearance: alert and no distress Neck: no carotid bruit, no JVD and thyroid not enlarged, symmetric, no tenderness/mass/nodules Lungs: clear to auscultation bilaterally Heart: regular rate and rhythm, S1, S2 normal, no murmur, click, rub or gallop Abdomen: soft, non-tender; bowel sounds normal; no masses,  no organomegaly Extremities: extremities normal, atraumatic, no cyanosis or edema Pulses: 2+ and symmetric Skin: Skin color, texture, turgor normal. No rashes or lesions Neurologic: Grossly normal Psych: Pleasant  EKG: Normal sinus rhythm at 72, possible left atrial enlargement, RBBB-personally reviewed  ASSESSMENT: 1. Stable angina 2. Recent PCI with DES to the ostial circumflex with a 4 x 12 mm Synergy DES  (04/2017) 3. Coronary artery disease status post CABG (LIMA to LAD-atretic, SVG to distal LAD) 4. Hypertension-controlled 5. Dyslipidemia-on statin 6. Diabetes type 2 7. Tingling and coolness of the fingers, concerning for Raynaud's phenomena 8. Recent small bowel obstruction and status post exploratory laparotomy 9. RBBB  PLAN: 1.   Mrs. Hidrogo continues to have chest pain despite being on high doses of isosorbide and Ranexa.  Her recent PCI was in February 2019 to the ostial circumflex.  She previously had a stent to the proximal RCA.  She has three-vessel coronary disease with a single patent bypass graft which is a LIMA to LAD.  She reports continued chest pain symptoms which may or may not be angina.  She says she is never felt nearly as good as she did after her first stent.  I recommend increasing her isosorbide further up to 90 mg twice daily.  We will continue her current dose of Ranexa.  Given her recent PCI, there is little evidence to go back for additional cardiac catheterization at this point.  I encouraged her to work with her orthopedic spine doctor at possible alternative treatments of her back pain.  She is considering injections or treatments but does not want to come off of her Plavix until least 6 months after her stent.  We will plan to see her back in 3 months and if she is doing well at that time may consider authorization to come off of Plavix for a procedure.  Chrystie Nose,  MD, [facility] Loll  [place]  [facility]  Medical Director of the [facility]  [facility] Attending Cardiologist  Direct Dial: [phone]  Fax: [phone]  Website:  [email or web address]  Lisette Abu Jentri Aye 07/27/2017, 10:30 AM

## 2017-07-27 NOTE — Patient Instructions (Addendum)
Your physician has recommended you make the following change in your medication:  -- INCREASE isosorbide mononitrate to  twice daily  Your physician recommends that you schedule a follow-up appointment in THREE MONTHS with Dr. Rennis Golden

## 2017-07-28 ENCOUNTER — Telehealth: Payer: Self-pay

## 2017-07-28 NOTE — Telephone Encounter (Signed)
   [facility] Pre-operative Risk Assessment    Request for surgical clearance:  1. What type of surgery is being performed? RADIO FREQUENCY ABLASION   2. When is this surgery scheduled? 11-02-17 @1015AM    3. What type of clearance is required (medical clearance vs. Pharmacy clearance to hold med vs. Both)? BOTH  4. Are there any medications that need to be held prior to surgery and how long? PLAVIX(7DAYS), ASA 81MG   5. Practice name and name of physician performing surgery?   [facility]  Doris Cheadle PhD, MD  6. What is your office phone number [phone]  fx [phone]   7.   Anesthesia type (None, local, MAC, general) ?  NOT LISTED   Waylan Rocher 07/28/2017, 10:13 AM  _________________________________________________________________   (provider comments below)

## 2017-07-29 NOTE — Telephone Encounter (Signed)
   Primary Cardiologist: Chrystie Nose, MD  Chart reviewed as part of pre-operative protocol coverage. Per Dr. Blanchie Dessert note 07/27/17 "She is considering injections or treatments but does not want to come off of her Plavix until least 6 months after her stent.  We will plan to see her back in 3 months and if she is doing well at that time may consider authorization to come off of Plavix for a procedure"/  Will review surgical clearance during office visit 11/02/17.  I will route this recommendation to the requesting party via Epic fax function and remove from pre-op pool.  Please call with questions.  [place], [place] 07/29/2017, 3:48 PM

## 2017-08-17 ENCOUNTER — Telehealth: Payer: Self-pay | Admitting: Internal Medicine

## 2017-08-17 NOTE — Telephone Encounter (Signed)
New Message:      Pt says she needs a code for the procedure she just had please.She called Billing and they her to call the nurse.

## 2017-08-17 NOTE — Telephone Encounter (Signed)
Returned call to patient. She needs procedure code for her recent cath/stent.   Cath + graft angiography: [id number] DES: [phone]

## 2017-11-02 ENCOUNTER — Ambulatory Visit (INDEPENDENT_AMBULATORY_CARE_PROVIDER_SITE_OTHER): Payer: Commercial Managed Care - PPO | Admitting: Internal Medicine

## 2017-11-02 ENCOUNTER — Encounter: Payer: Self-pay | Admitting: Internal Medicine

## 2017-11-02 VITALS — BP 110/68 | HR 68 | Ht 64.0 in | Wt 165.6 lb

## 2017-11-02 DIAGNOSIS — I208 Other forms of angina pectoris: Secondary | ICD-10-CM

## 2017-11-02 DIAGNOSIS — I200 Unstable angina: Secondary | ICD-10-CM | POA: Diagnosis not present

## 2017-11-02 DIAGNOSIS — Z951 Presence of aortocoronary bypass graft: Secondary | ICD-10-CM

## 2017-11-02 DIAGNOSIS — R600 Localized edema: Secondary | ICD-10-CM | POA: Insufficient documentation

## 2017-11-02 MED ORDER — FUROSEMIDE 40 MG PO TABS: 40.0000 mg | ORAL_TABLET | Freq: Every day | ORAL | 3 refills | Status: DC | PRN

## 2017-11-02 NOTE — Progress Notes (Signed)
OFFICE NOTE  Chief Complaint:  Follow-up stable angina  Primary Care Physician: Theodoro Kos, MD  HPI:  Tamara Nunez  is a 62 year old female with history of coronary disease. She had CABG in 2006, LIMA to LAD, saphenous vein graft to distal LAD. She had chest pain and some fatigue after that in 2009 and had a heart cath which showed widely patent grafts. She has continued to have atypical chest pain which worsens with positioning but seemed to improve with nitroglycerin spray. However, she was given isosorbide which she reports today has not helped her symptoms but reported it had helped her in the past. The main side effect is significant headache on isosorbide which is bothersome for her. She also takes Ranexa. She continues to have chest pain and heaviness mostly on her left side but is also a soreness. She says it is more sore when she pushes on the chest wall underneath the tail of the breast. She had a mammogram which currently was negative for any mass and, again, the symptoms do sound to be more musculoskeletal, possibly a radiculopathy either from the cervical root or perhaps a thoracic nerve that comes around the chest wall. She does have a history of reflux but has not had complaints regarding that and I had recommended a stress test to look for any reversible ischemia that could be causing her symptoms. The stress test performed on June 03, 2012, was low risk demonstrating no significant reversibility. The EKG did show some T-wave changes in 2, 3 and aVF and laterally V2 through V6 with Lexiscan that resolved post infusion. However, this is nonspecific.   She returns today with no specific complaints. She occasionally gets chest discomfort from time to time however the symptoms are well managed. She is complaining of some numbness feeling in her fingers and occasionally notes that there is some color change in her fingers in cold weather, which sounds like Raynaud's  phenomenon.  I saw Tamara Nunez back in the office today. She tells that she's had 2 surgeries since I saw her last. In September she underwent back surgery and has had marked improvement in her pain. About 2-3 weeks ago she had surgery on her left thumb for a trigger finger. She recovered from both of these well. She continues to have minor anginal symptoms on and off which is about baseline for her in the past. She tells me that she's been more fatigued recently and was noted to have mild anemia on laboratory work performed for her surgeries. It was recommended that she go on iron but she has not started that. I've encouraged her to follow-up with her primary care physician for this.  Today for follow-up. She was last seen over a year ago and has recently been ill. She was just discharged from [facility] a few days ago after she developed small bowel obstruction and what sounds a Perforated diverticulum. She underwent exploratory laparotomy and was hospitalized for a period of time. It does not sound like she had any cardiac issues during that procedure. She does have some chronic fatigue complaints and reports she is very fatigued today. She says it's been has been getting progressively worse she does not think is related to her abdominal infection.  10/27/2016  Tamara Nunez returns today for follow-up. She had noted progressive worsening of shortness of breath and chest discomfort which sound like typical angina and underwent left heart catheterization by Dr. Herbie Baltimore on 09/26/2016. This demonstrated a proximal  RCA lesion that was 80% stenosis. She had successful placement of a 2.5 x 8 mm drug-eluting stent. She was found to have an atretic LIMA to LAD with 100% occlusion of the mid LAD. Her saphenous vein graft to the distal LAD was normal. EF was 55-60%. She reports that she never had any significant improvement in her fatigue and discomfort. She still gets discomfort in the left chest which goes to her  back. She said her husband gave her a "bear hug" and she reported some pain in her mid back. This was reproducible today in the office. She also reports fatigue and discomfort in her chest with exertion that relieves with rest and is improved with sublingual nitroglycerin spray.  12/01/2016  Tamara Nunez returns today for follow-up. I placed her on isosorbide 30 mg daily for recurrent chest discomfort. She still feels some heaviness in her chest however it is improved. She says it's now more bearable and is able to do activities. It has not completely resolved. She has not required any sublingual nitroglycerin spray. She initially had some headache but that is resolved.  03/11/2017  Tamara Nunez was seen today in follow-up.  She still reports symptoms of angina.  She says her isosorbide wears off by about 1 or 2 PM and she has to take another one.  She then notes that it wears off around 7 at night.  She is interested in increasing the dose.  She is compliant with her ranolazine.  He has now been more than 6 months since her stenting.  She is considering radiofrequency ablation of her back for chronic back pain.  In order to accomplish that she need to come off of her Plavix.  07/27/2017  Tamara Nunez returns today for follow-up.  She continues to have chest discomfort.  She feels like she does not get adequate relief from the isosorbide.  In addition she is on Ranexa.  She says that she used to have benefit from it when she was receiving the name Ranexa however she is not convinced that the generic Ranexa is providing the same benefit.  I explained to her that my knowledge of the medication from a recent interaction with a drug representative is that the company is no longer planning to make a brand-name medication, therefore she will have no other options for treatment.  With regards to isosorbide, I again stated that there is no benefit to taking the medication 3 days versus twice a day.  Perhaps there could be benefit from  increase in the dose.  I am not totally convinced that her symptoms are all angina.  She does have significant thoracic and lumbar spine disease and may be having some pain related to radiculopathy.  She was told to use a lidocaine patch however she does not use that often.  11/02/2017  Tamara Nunez was seen today in follow-up.  She reports stable angina without new or worsening chest pain.  She denies any additional sublingual nitroglycerin use.  Her main concern today is lower extremity swelling.  She has peripheral neuropathy, which may be related to diabetes or chronic low back pain which is more likely.  She in fact is planning on an injection to her back today.  She has been holding Plavix for 5 days.  I advised her to start her Plavix up again 48 hours after her injection.  Her concern is that the swelling makes it difficult for her to get her shoes on.  Typically is worse in the evening and  gets better in the morning.  I suspect this is related to neuropathy.  She denies any worsening shortness of breath, weight gain, orthopnea or other heart failure symptoms.  PMHx:  Past Medical History:  Diagnosis Date  . Arthritis    "in my back" (05/06/2017)  . Chronic back pain   . Coronary artery disease    a. s/p CABG in 2006 with LIMA-LAD, SVG-dLAD. b. cath in 2009 showing patent grafts. c. 09/2016: cath with occluded LAD but patent SVG-dLAD. LIMA was atretic. pRCA with 80% stenosis --> treated with DES  . Fibromyalgia   . GERD (gastroesophageal reflux disease)   . Hyperlipidemia   . Hypertension   . Type II diabetes mellitus (HCC)     Past Surgical History:  Procedure Laterality Date  . ABDOMINAL HERNIA REPAIR  2018  . BACK SURGERY    . CARDIAC CATHETERIZATION  01/13/2008  . CORONARY ANGIOPLASTY WITH STENT PLACEMENT  09/26/2016   Prox RCA lesion, 80 %stenosed. DES   . CORONARY ARTERY BYPASS GRAFT  12/23/2004   2 vessel - LIMA to LAD (atretic), SVG to distal LAD  . CORONARY STENT INTERVENTION  N/A 09/26/2016   Procedure: Coronary Stent Intervention;  Surgeon: Marykay Lex, MD;  Location[facility] INVASIVE CV LAB;  Service: Cardiovascular;  Laterality: N/A;  . CORONARY STENT INTERVENTION N/A 05/06/2017   Procedure: CORONARY STENT INTERVENTION;  Surgeon: Kathleene Hazel, MD;  Location: [facility] INVASIVE CV LAB;  Service: Cardiovascular;  Laterality: N/A;  . DOPPLER ECHOCARDIOGRAPHY  04/12/2008   EF 60%  . GASTRIC BYPASS  2009  . HERNIA REPAIR    . LEFT HEART CATH AND CORONARY ANGIOGRAPHY N/A 05/06/2017   Procedure: LEFT HEART CATH AND CORONARY ANGIOGRAPHY;  Surgeon: Kathleene Hazel, MD;  Location: [facility] INVASIVE CV LAB;  Service: Cardiovascular;  Laterality: N/A;  . LEFT HEART CATH AND CORS/GRAFTS ANGIOGRAPHY N/A 09/26/2016   Procedure: Left Heart Cath and Cors/Grafts Angiography;  Surgeon: Marykay Lex, MD;  Location: [facility] INVASIVE CV LAB;  Service: Cardiovascular;  Laterality: N/A;  . NM MYOVIEW LTD  06/03/2012   EF 71%  . POSTERIOR FUSION LUMBAR SPINE  1990s  . TUBAL LIGATION    . VAGINAL HYSTERECTOMY     "partial"    FAMHx:  Family History  Problem Relation Age of Onset  . Heart disease Mother   . Cancer - Lung Father     SOCHx:   reports that she has never smoked. She has never used smokeless tobacco. She reports that she does not drink alcohol or use drugs.  ALLERGIES:  Allergies  Allergen Reactions  . Codeine Hives and Shortness Of Breath       . Other Anaphylaxis    Almonds   . Ciprofloxacin Itching  . Valsartan Rash  . Adhesive [Tape] Itching    "clear tape" used after surgery."took all her skin off".  Paper take is ok  . Sulfa Antibiotics     nausea nausea    ROS: Pertinent items noted in HPI and remainder of comprehensive ROS otherwise negative.  HOME MEDS: Current Outpatient Medications  Medication Sig Dispense Refill  . acetaminophen (TYLENOL) 500 MG tablet Take 1,000 mg by mouth every 6 (six) hours as needed for moderate pain or headache.     . albuterol (PROVENTIL HFA;VENTOLIN HFA) 108 (90 BASE) MCG/ACT inhaler Inhale 2 puffs into the lungs every 6 (six) hours as needed for wheezing.    Marland Kitchen aspirin EC 81 MG tablet Take 81 mg by  mouth daily.    . cetirizine (ZYRTEC) 10 MG tablet Take 10 mg by mouth as needed for allergies.     Marland Kitchen. clopidogrel (PLAVIX) 75 MG tablet Take 1 tablet (75 mg total) by mouth daily with breakfast. 90 tablet 3  . fluticasone (FLONASE) 50 MCG/ACT nasal spray Place 1 spray into both nostrils 2 (two) times daily.     . furosemide (LASIX) 20 MG tablet Take 20 mg by mouth daily as needed for fluid or edema.     . isosorbide mononitrate (IMDUR) 60 MG 24 hr tablet Take 1.5 tablets (90 mg total) by mouth 2 (two) times daily. 270 tablet 3  . lidocaine (LIDODERM) 5 % Place 2 patches onto the skin daily as needed (for pain). Remove & Discard patch within 12 hours or as directed by MD     . metFORMIN (GLUCOPHAGE) 500 MG tablet Take 0.5 tablets (250 mg total) by mouth 2 (two) times daily.    . metoprolol tartrate (LOPRESSOR) 25 MG tablet Take 25 mg by mouth 2 (two) times daily.    . nitroGLYCERIN (NITROLINGUAL) 0.4 MG/SPRAY spray Place 1 spray under the tongue every 5 (five) minutes as needed for chest pain. 12 g 1  . pantoprazole (PROTONIX) 40 MG tablet Take 40 mg by mouth daily as needed (acid reflux).     . ramipril (ALTACE) 5 MG capsule Take 5 mg by mouth at bedtime.     . ranolazine (RANEXA) 1000 MG SR tablet Take 1 tablet (1,000 mg total) by mouth 2 (two) times daily. 56 tablet 0  . simvastatin (ZOCOR) 20 MG tablet Take 20 mg by mouth at bedtime.    . vitamin B-12 (CYANOCOBALAMIN) 1000 MCG tablet Take 1,000 mcg by mouth daily.    . vitamin E 400 UNIT capsule Take 400 Units by mouth daily.     No current facility-administered medications for this visit.     LABS/IMAGING: No results found for this or any previous visit (from the past 48 hour(s)). No results found.  VITALS: BP 110/68   Pulse 68   Ht 5\' 4"  (1.626 m)    Wt 165 lb 9.6 oz (75.1 kg)   BMI 28.43 kg/m   EXAM: General appearance: alert and no distress Neck: no carotid bruit, no JVD and thyroid not enlarged, symmetric, no tenderness/mass/nodules Lungs: clear to auscultation bilaterally Heart: regular rate and rhythm, S1, S2 normal, no murmur, click, rub or gallop Abdomen: soft, non-tender; bowel sounds normal; no masses,  no organomegaly Extremities: extremities normal, atraumatic, no cyanosis or edema Pulses: 2+ and symmetric Skin: Skin color, texture, turgor normal. No rashes or lesions Neurologic: Grossly normal Psych: Pleasant  EKG: Normal sinus rhythm at 68, RBBB-personally reviewed  ASSESSMENT: 1. Stable angina 2. Recent PCI with DES to the ostial circumflex with a 4 x 12 mm Synergy DES (04/2017) 3. Coronary artery disease status post CABG (LIMA to LAD-atretic, SVG to distal LAD) 4. Hypertension-controlled 5. Dyslipidemia-on statin 6. Diabetes type 2 7. Tingling and coolness of the fingers, concerning for Raynaud's phenomena 8. Recent small bowel obstruction and status post exploratory laparotomy 9. RBBB 10. Lower extremity edema-secondary to neuropathy 11. Chronic back pain with peripheral neuropathy  PLAN: 1.   Tamara Nunez reports stable anginal symptoms.  She is cleared for back injections today.  She has been holding Plavix for 5 days.  She is advised to start Plavix back after 48 hours.  She is complaining of lower extremity swelling.  She notes that low-dose Lasix  is not helpful.  I have increased her as needed Lasix dose to 40 mg and we will prescribe 20 to 30 mm bilateral knee-high compression stockings for her as her swelling is likely related to neuropathy from her back.  Follow-up with me 6 months or sooner as necessary.  Chrystie Nose, MD, [facility], FACP  [place]  [facility]  Medical Director of the [facility]  [facility] Attending Cardiologist  Direct Dial:  [phone]  Fax: [phone]  Website:  [email or web address]  Lisette Abu Hilty 11/02/2017, 8:25 AM

## 2017-11-02 NOTE — Patient Instructions (Signed)
Your physician has recommended you make the following change in your medication: INCREASE lasix to 40mg  as needed  Dr. Rennis Golden recommends KNEE HIGH COMPRESSION STOCKINGS  -- 20-30 mmHg (compression strength) -- [facility]  -- [address] [place]  -- [phone]  -- [facility] [facility]   -- [address] [place]   -- [phone] -- [facility]  -- [address] [place]  -- [phone]   How to Use Compression Stockings Compression stockings are elastic socks that squeeze the legs. They help to increase blood flow to the legs, decrease swelling in the legs, and reduce the chance of developing blood clots in the lower legs. Compression stockings are often used by people who:  Are recovering from surgery.  Have poor circulation in their legs.  Are prone to getting blood clots in their legs.  Have varicose veins.  Sit or stay in bed for long periods of time. How to use compression stockings Before you put on your compression stockings:  Make sure that they are the correct size. If you do not know your size, ask your health care provider.  Make sure that they are clean, dry, and in good condition.  Check them for rips and tears. Do not put them on if they are ripped or torn. Put your stockings on first thing in the morning, before you get out of bed. Keep them on for as long as your health care provider advises. When you are wearing your stockings:  Keep them as smooth as possible. Do not allow them to bunch up. It is especially important to prevent the stockings from bunching up around your toes or behind your knees.  Do not roll the stockings downward and leave them rolled down. This can decrease blood flow to your leg.  Change them right away if they become wet or dirty. When you take off your stockings, inspect your legs and feet. Anything that does not seem normal may require medical attention. Look  for:  Open sores.  Red spots.  Swelling. Information and tips  Do not stop wearing your compression stockings without talking to your health care provider first.  Wash your stockings every day with mild detergent in cold or warm water. Do not use bleach. Air-dry your stockings or dry them in a clothes dryer on low heat.  Replace your stockings every 3-6 months.  If skin moisturizing is part of your treatment plan, apply lotion or cream at night so that your skin will be dry when you put on the stockings in the morning. It is harder to put the stockings on when you have lotion on your legs or feet. Contact a health care provider if: Remove your stockings and seek medical care if:  You have a feeling of pins and needles in your feet or legs.  You have any new changes in your skin.  You have skin lesions that are getting worse.  You have swelling or pain that is getting worse. Get help right away if:  You have numbness or tingling in your lower legs that does not get better right after you take the stockings off.  Your toes or feet become cold and blue.  You develop open sores or red spots on your legs that do not go away.  You see or feel a warm spot on your leg.  You have new swelling or soreness in your leg.  You are short of breath or you have chest pain  for no reason.  You have a rapid or irregular heartbeat.  You feel light-headed or dizzy. This information is not intended to replace advice given to you by your health care provider. Make sure you discuss any questions you have with your health care provider. Document Released: 12/22/2008 Document Revised: 07/25/2015 Document Reviewed: 02/01/2014 Elsevier Interactive Patient Education  2017 [employer]   Your physician wants you to follow-up in: 6 months with Dr. Rennis GoldenHilty. You will receive a reminder letter in the mail two months in advance. If you don't receive a letter, please call our office to schedule the  follow-up appointment.

## 2017-11-10 ENCOUNTER — Telehealth: Payer: Self-pay | Admitting: Internal Medicine

## 2017-11-10 NOTE — Telephone Encounter (Signed)
° ° °  Patient calling with concerns about restarting Plavix. Patient states she does not feel well since she started taking medication  1. Are you having CP right now?  A "little" pressure  2. Are you experiencing any other symptoms (ex. SOB, nausea, vomiting, sweating)? Nausea, vomiting on Saturday  3. How long have you been experiencing CP? 3 days  4. Is your CP continuous or coming and going? COMING AND GOING  5. Have you taken Nitroglycerin? YES ?

## 2017-11-10 NOTE — Telephone Encounter (Signed)
Returned call to patient of Dr. Rennis Golden. She held plavix from 8-19 to 8-29 for a back procedure. She resumed it 8/30. She reports since then, she has had a little chest pressure, N/V, has had to use NTG daily. She is concerned about dehydration as well. Advised that patient go to local ED for work up of symptoms given complex CAD history. She voiced understanding.   Routed to MD as Lorain Childes

## 2017-12-30 ENCOUNTER — Telehealth: Payer: Self-pay | Admitting: Internal Medicine

## 2017-12-30 NOTE — Telephone Encounter (Signed)
Received call from patient.She stated for the past few weeks she has been having left sided chest pain similar to pain she had before her stent.She tires easy,occasional sob.Appointment offered today but she cannot come.Appointment scheduled with Dr.Hilty 01/01/18 at 8:00 am.Advised to go to ED if needed.

## 2018-01-01 ENCOUNTER — Encounter: Payer: Self-pay | Admitting: Internal Medicine

## 2018-01-01 ENCOUNTER — Ambulatory Visit (INDEPENDENT_AMBULATORY_CARE_PROVIDER_SITE_OTHER): Payer: Commercial Managed Care - PPO | Admitting: Internal Medicine

## 2018-01-01 VITALS — BP 115/71 | HR 75 | Ht 64.0 in | Wt 162.0 lb

## 2018-01-01 DIAGNOSIS — Z951 Presence of aortocoronary bypass graft: Secondary | ICD-10-CM | POA: Diagnosis not present

## 2018-01-01 DIAGNOSIS — I200 Unstable angina: Secondary | ICD-10-CM | POA: Diagnosis not present

## 2018-01-01 DIAGNOSIS — R5383 Other fatigue: Secondary | ICD-10-CM | POA: Diagnosis not present

## 2018-01-01 LAB — COMPREHENSIVE METABOLIC PANEL
ALT: 17 IU/L (ref 0–32)
AST: 19 IU/L (ref 0–40)
Albumin/Globulin Ratio: 1.4 (ref 1.2–2.2)
Albumin: 4.2 g/dL (ref 3.6–4.8)
Alkaline Phosphatase: 92 IU/L (ref 39–117)
BUN/Creatinine Ratio: 17 (ref 12–28)
BUN: 15 mg/dL (ref 8–27)
Bilirubin Total: 0.3 mg/dL (ref 0.0–1.2)
CO2: 20 mmol/L (ref 20–29)
Calcium: 9.2 mg/dL (ref 8.7–10.3)
Chloride: 104 mmol/L (ref 96–106)
Creatinine, Ser: 0.86 mg/dL (ref 0.57–1.00)
GFR calc Af Amer: 83 mL/min/{1.73_m2} (ref >59)
GFR calc non Af Amer: 72 mL/min/{1.73_m2} (ref >59)
Globulin, Total: 2.9 g/dL (ref 1.5–4.5)
Glucose: 75 mg/dL (ref 65–99)
Potassium: 4.6 mmol/L (ref 3.5–5.2)
Sodium: 138 mmol/L (ref 134–144)
Total Protein: 7.1 g/dL (ref 6.0–8.5)

## 2018-01-01 LAB — CBC
Hematocrit: 32.7 % — ABNORMAL LOW (ref 34.0–46.6)
Hemoglobin: 10 g/dL — ABNORMAL LOW (ref 11.1–15.9)
MCH: 23.5 pg — ABNORMAL LOW (ref 26.6–33.0)
MCHC: 30.6 g/dL — ABNORMAL LOW (ref 31.5–35.7)
MCV: 77 fL — ABNORMAL LOW (ref 79–97)
Platelets: 412 10*3/uL (ref 150–450)
RBC: 4.26 x10E6/uL (ref 3.77–5.28)
RDW: 17.3 % — ABNORMAL HIGH (ref 12.3–15.4)
WBC: 5.7 10*3/uL (ref 3.4–10.8)

## 2018-01-01 LAB — TSH: TSH: 1.54 u[IU]/mL (ref 0.450–4.500)

## 2018-01-01 NOTE — Progress Notes (Signed)
OFFICE NOTE  Chief Complaint:  Increasing chest pain  Primary Care Physician: Theodoro Kos, MD  HPI:  Tamara Nunez  is a 62 year old female with history of coronary disease. She had CABG in 2006, LIMA to LAD, saphenous vein graft to distal LAD. She had chest pain and some fatigue after that in 2009 and had a heart cath which showed widely patent grafts. She has continued to have atypical chest pain which worsens with positioning but seemed to improve with nitroglycerin spray. However, she was given isosorbide which she reports today has not helped her symptoms but reported it had helped her in the past. The main side effect is significant headache on isosorbide which is bothersome for her. She also takes Ranexa. She continues to have chest pain and heaviness mostly on her left side but is also a soreness. She says it is more sore when she pushes on the chest wall underneath the tail of the breast. She had a mammogram which currently was negative for any mass and, again, the symptoms do sound to be more musculoskeletal, possibly a radiculopathy either from the cervical root or perhaps a thoracic nerve that comes around the chest wall. She does have a history of reflux but has not had complaints regarding that and I had recommended a stress test to look for any reversible ischemia that could be causing her symptoms. The stress test performed on June 03, 2012, was low risk demonstrating no significant reversibility. The EKG did show some T-wave changes in 2, 3 and aVF and laterally V2 through V6 with Lexiscan that resolved post infusion. However, this is nonspecific.   She returns today with no specific complaints. She occasionally gets chest discomfort from time to time however the symptoms are well managed. She is complaining of some numbness feeling in her fingers and occasionally notes that there is some color change in her fingers in cold weather, which sounds like Raynaud's  phenomenon.  I saw [name] back in the office today. She tells that she's had 2 surgeries since I saw her last. In September she underwent back surgery and has had marked improvement in her pain. About 2-3 weeks ago she had surgery on her left thumb for a trigger finger. She recovered from both of these well. She continues to have minor anginal symptoms on and off which is about baseline for her in the past. She tells me that she's been more fatigued recently and was noted to have mild anemia on laboratory work performed for her surgeries. It was recommended that she go on iron but she has not started that. I've encouraged her to follow-up with her primary care physician for this.  Today for follow-up. She was last seen over a year ago and has recently been ill. She was just discharged from [facility] a few days ago after she developed small bowel obstruction and what sounds a Perforated diverticulum. She underwent exploratory laparotomy and was hospitalized for a period of time. It does not sound like she had any cardiac issues during that procedure. She does have some chronic fatigue complaints and reports she is very fatigued today. She says it's been has been getting progressively worse she does not think is related to her abdominal infection.  10/27/2016  [name] returns today for follow-up. She had noted progressive worsening of shortness of breath and chest discomfort which sound like typical angina and underwent left heart catheterization by Dr. Herbie Baltimore on 09/26/2016. This demonstrated a proximal  RCA lesion that was 80% stenosis. She had successful placement of a 2.5 x 8 mm drug-eluting stent. She was found to have an atretic LIMA to LAD with 100% occlusion of the mid LAD. Her saphenous vein graft to the distal LAD was normal. EF was 55-60%. She reports that she never had any significant improvement in her fatigue and discomfort. She still gets discomfort in the left chest which goes to her  back. She said her husband gave her a "bear hug" and she reported some pain in her mid back. This was reproducible today in the office. She also reports fatigue and discomfort in her chest with exertion that relieves with rest and is improved with sublingual nitroglycerin spray.  12/01/2016  [name] returns today for follow-up. I placed her on isosorbide 30 mg daily for recurrent chest discomfort. She still feels some heaviness in her chest however it is improved. She says it's now more bearable and is able to do activities. It has not completely resolved. She has not required any sublingual nitroglycerin spray. She initially had some headache but that is resolved.  03/11/2017  [name] was seen today in follow-up.  She still reports symptoms of angina.  She says her isosorbide wears off by about 1 or 2 PM and she has to take another one.  She then notes that it wears off around 7 at night.  She is interested in increasing the dose.  She is compliant with her ranolazine.  He has now been more than 6 months since her stenting.  She is considering radiofrequency ablation of her back for chronic back pain.  In order to accomplish that she need to come off of her Plavix.  07/27/2017  [name] returns today for follow-up.  She continues to have chest discomfort.  She feels like she does not get adequate relief from the isosorbide.  In addition she is on Ranexa.  She says that she used to have benefit from it when she was receiving the name Ranexa however she is not convinced that the generic Ranexa is providing the same benefit.  I explained to her that my knowledge of the medication from a recent interaction with a drug representative is that the company is no longer planning to make a brand-name medication, therefore she will have no other options for treatment.  With regards to isosorbide, I again stated that there is no benefit to taking the medication 3 days versus twice a day.  Perhaps there could be benefit from  increase in the dose.  I am not totally convinced that her symptoms are all angina.  She does have significant thoracic and lumbar spine disease and may be having some pain related to radiculopathy.  She was told to use a lidocaine patch however she does not use that often.  11/02/2017  Ranessa was seen today in follow-up.  She reports stable angina without new or worsening chest pain.  She denies any additional sublingual nitroglycerin use.  Her main concern today is lower extremity swelling.  She has peripheral neuropathy, which may be related to diabetes or chronic low back pain which is more likely.  She in fact is planning on an injection to her back today.  She has been holding Plavix for 5 days.  I advised her to start her Plavix up again 48 hours after her injection.  Her concern is that the swelling makes it difficult for her to get her shoes on.  Typically is worse in the evening and  gets better in the morning.  I suspect this is related to neuropathy.  She denies any worsening shortness of breath, weight gain, orthopnea or other heart failure symptoms.  01/01/2018  Baya is seen today again in follow-up.  Unfortunately she has had some worsening chest pain.  She had to stop her Plavix for a few days for procedure and noticed that she felt worse off of it.  Over the past several weeks she has had some crescendo angina pain.  Worse when she is doing housework or other activities.  Is physically exhausting for her and she has to stop.  She generally feels okay at rest.  She is also had to take nitroglycerin several times for the symptoms on top of her isosorbide.  Currently she is on 90 mg twice daily as well as renal Lindzen thousand milligrams twice daily.  She did have a stent back in February of this year.  It seems unlikely she has had any significant progression of her epicardial coronary disease however is noted to have small vessel disease as well.  The symptoms however concerning for unstable  angina.  PMHx:  Past Medical History:  Diagnosis Date  . Arthritis    "in my back" (05/06/2017)  . Chronic back pain   . Coronary artery disease    a. s/p CABG in 2006 with LIMA-LAD, SVG-dLAD. b. cath in 2009 showing patent grafts. c. 09/2016: cath with occluded LAD but patent SVG-dLAD. LIMA was atretic. pRCA with 80% stenosis --> treated with DES  . Fibromyalgia   . GERD (gastroesophageal reflux disease)   . Hyperlipidemia   . Hypertension   . Type II diabetes mellitus (HCC)     Past Surgical History:  Procedure Laterality Date  . ABDOMINAL HERNIA REPAIR  2018  . BACK SURGERY    . CARDIAC CATHETERIZATION  01/13/2008  . CORONARY ANGIOPLASTY WITH STENT PLACEMENT  09/26/2016   Prox RCA lesion, 80 %stenosed. DES   . CORONARY ARTERY BYPASS GRAFT  12/23/2004   2 vessel - LIMA to LAD (atretic), SVG to distal LAD  . CORONARY STENT INTERVENTION N/A 09/26/2016   Procedure: Coronary Stent Intervention;  Surgeon: Marykay Lex, MD;  [facility] INVASIVE CV LAB;  Service: Cardiovascular;  Laterality: N/A;  . CORONARY STENT INTERVENTION N/A 05/06/2017   Procedure: CORONARY STENT INTERVENTION;  Surgeon: Kathleene Hazel, MD;  Location: [facility] INVASIVE CV LAB;  Service: Cardiovascular;  Laterality: N/A;  . DOPPLER ECHOCARDIOGRAPHY  04/12/2008   EF 60%  . GASTRIC BYPASS  2009  . HERNIA REPAIR    . LEFT HEART CATH AND CORONARY ANGIOGRAPHY N/A 05/06/2017   Procedure: LEFT HEART CATH AND CORONARY ANGIOGRAPHY;  Surgeon: Kathleene Hazel, MD;  Location: [facility] INVASIVE CV LAB;  Service: Cardiovascular;  Laterality: N/A;  . LEFT HEART CATH AND CORS/GRAFTS ANGIOGRAPHY N/A 09/26/2016   Procedure: Left Heart Cath and Cors/Grafts Angiography;  Surgeon: Marykay Lex, MD;  Location: [facility] INVASIVE CV LAB;  Service: Cardiovascular;  Laterality: N/A;  . NM MYOVIEW LTD  06/03/2012   EF 71%  . POSTERIOR FUSION LUMBAR SPINE  1990s  . TUBAL LIGATION    . VAGINAL HYSTERECTOMY     "partial"    FAMHx:   Family History  Problem Relation Age of Onset  . Heart disease Mother   . Cancer - Lung Father     SOCHx:   reports that she has never smoked. She has never used smokeless tobacco. She reports that she does not drink  alcohol or use drugs.  ALLERGIES:  Allergies  Allergen Reactions  . Codeine Hives and Shortness Of Breath       . Other Anaphylaxis    Almonds   . Ciprofloxacin Itching  . Valsartan Rash  . Adhesive [Tape] Itching    "clear tape" used after surgery."took all her skin off".  Paper take is ok  . Sulfa Antibiotics     nausea nausea    ROS: Pertinent items noted in HPI and remainder of comprehensive ROS otherwise negative.  HOME MEDS: Current Outpatient Medications  Medication Sig Dispense Refill  . acetaminophen (TYLENOL) 500 MG tablet Take 1,000 mg by mouth every 6 (six) hours as needed for moderate pain or headache.    . albuterol (PROVENTIL HFA;VENTOLIN HFA) 108 (90 BASE) MCG/ACT inhaler Inhale 2 puffs into the lungs every 6 (six) hours as needed for wheezing.    Marland Kitchen amoxicillin (AMOXIL) 500 MG capsule TAKE 4 CAPSULES BY MOUTH 1 HOUR BEFORE DENTAL APPOINTMENT- TAKE WITH FOOD  0  . aspirin EC 81 MG tablet Take 81 mg by mouth daily.    . cetirizine (ZYRTEC) 10 MG tablet Take 10 mg by mouth as needed for allergies.     Marland Kitchen clopidogrel (PLAVIX) 75 MG tablet Take 1 tablet (75 mg total) by mouth daily with breakfast. 90 tablet 3  . fluticasone (FLONASE) 50 MCG/ACT nasal spray Place 1 spray into both nostrils 2 (two) times daily.     . furosemide (LASIX) 40 MG tablet Take 1 tablet (40 mg total) by mouth daily as needed for fluid or edema. 90 tablet 3  . gabapentin (NEURONTIN) 600 MG tablet Take 1 tablet by mouth 3 (three) times daily.    . isosorbide mononitrate (IMDUR) 60 MG 24 hr tablet Take 1.5 tablets (90 mg total) by mouth 2 (two) times daily. 270 tablet 3  . lidocaine (LIDODERM) 5 % Place 2 patches onto the skin daily as needed (for pain). Remove & Discard patch  within 12 hours or as directed by MD     . metFORMIN (GLUCOPHAGE) 500 MG tablet Take 0.5 tablets (250 mg total) by mouth 2 (two) times daily.    . metoprolol tartrate (LOPRESSOR) 25 MG tablet Take 25 mg by mouth 2 (two) times daily.    . nitroGLYCERIN (NITROLINGUAL) 0.4 MG/SPRAY spray Place 1 spray under the tongue every 5 (five) minutes as needed for chest pain. 12 g 1  . pantoprazole (PROTONIX) 40 MG tablet Take 40 mg by mouth daily as needed (acid reflux).     . polyethylene glycol powder (GLYCOLAX/MIRALAX) powder Take 1 Container by mouth daily as needed.    . ramipril (ALTACE) 5 MG capsule Take 5 mg by mouth at bedtime.     . ranolazine (RANEXA) 1000 MG SR tablet Take 1 tablet (1,000 mg total) by mouth 2 (two) times daily. 56 tablet 0  . simvastatin (ZOCOR) 20 MG tablet Take 20 mg by mouth at bedtime.    . vitamin B-12 (CYANOCOBALAMIN) 1000 MCG tablet Take 1,000 mcg by mouth daily.    . vitamin E 400 UNIT capsule Take 400 Units by mouth daily.     No current facility-administered medications for this visit.     LABS/IMAGING: No results found for this or any previous visit (from the past 48 hour(s)). No results found.  VITALS: BP 115/71   Pulse 75   Ht 5\' 4"  (1.626 m)   Wt 162 lb (73.5 kg)   BMI 27.81 kg/m  EXAM: General appearance: alert and no distress Neck: no carotid bruit, no JVD and thyroid not enlarged, symmetric, no tenderness/mass/nodules Lungs: clear to auscultation bilaterally Heart: regular rate and rhythm, S1, S2 normal, no murmur, click, rub or gallop Abdomen: soft, non-tender; bowel sounds normal; no masses,  no organomegaly Extremities: extremities normal, atraumatic, no cyanosis or edema Pulses: 2+ and symmetric Skin: Skin color, texture, turgor normal. No rashes or lesions Neurologic: Grossly normal Psych: Pleasant  EKG: Sinus rhythm at 75, RBBB-personally reviewed  ASSESSMENT: 1. Stable angina 2. Recent PCI with DES to the ostial circumflex with a 4  x 12 mm Synergy DES (04/2017) 3. Coronary artery disease status post CABG (LIMA to LAD-atretic, SVG to distal LAD) 4. Hypertension-controlled 5. Dyslipidemia-on statin 6. Diabetes type 2 7. Tingling and coolness of the fingers, concerning for Raynaud's phenomena 8. Recent small bowel obstruction and status post exploratory laparotomy 9. RBBB 10. Lower extremity edema-secondary to neuropathy 11. Chronic back pain with peripheral neuropathy  PLAN: 1.   Mrs. Steedley has had progressive chest pain symptoms and shortness of breath with exertion.  She gets some relief from additional nitroglycerin.  Mostly she has to rest but is impairing her lifestyle.  It is unusual that this symptom pattern is so significant only 8 months after her most recent PCI to the ostial circumflex.  I would like for her to undergo a Lexiscan Myoview stress test.  If there is significant ischemic burden then she will need a repeat cath.  Otherwise we will continue with medical therapy.  We will also check labs to make sure there is no significant anemia or hypothyroidism.  Plan follow-up with me afterwards.  Chrystie Nose, MD, [facility], FACP  [place]  [facility]  Medical Director of the Advanced Lipid [facility] &  Cardiovascular Risk Reduction Clinic Attending Cardiologist  Direct Dial: [phone]  Fax: [phone]  Website:  [email or web address] Nicely Jahlia Omura 01/01/2018, 8:13 AM

## 2018-01-01 NOTE — Patient Instructions (Addendum)
Medication Instructions:  Continue current medications If you need a refill on your cardiac medications before your next appointment, please call your pharmacy.   Lab work: To be completed today If you have labs (blood work) drawn today and your tests are completely normal, you will receive your results only by: Marland Kitchen MyChart Message (if you have MyChart) OR . A paper copy in the mail If you have any lab test that is abnormal or we need to change your treatment, we will call you to review the results.  Testing/Procedures: Dr. Rennis Golden has ordered a Lexiscan Myocardial Perfusion Imaging Study. This is done @ [address] - [address] Floor  Please arrive 15 minutes prior to your appointment time for registration and insurance purposes.   The test will take approximately 3 to 4 hours to complete; you may bring reading material.  If someone comes with you to your appointment, they will need to remain in the main lobby due to limited space in the testing area.    How to prepare for your Myocardial Perfusion Test:  Do not eat or drink 3 hours prior to your test, except you may have water.  Do not consume products containing caffeine (regular or decaffeinated) 12 hours prior to your test. (ex: coffee, chocolate, sodas, tea).  Do wear comfortable clothes (no dresses or overalls) and walking shoes, tennis shoes preferred (No heels or open toe shoes are allowed).  Do NOT wear cologne, perfume, aftershave, or lotions (deodorant is allowed).  If you use an inhaler, use it the AM of your test and bring it with you.   If you use a nebulizer, use it the AM of your test.   If these instructions are not followed, your test will have to be rescheduled.   Follow-Up: At [facility], you and your health needs are our priority.  As part of our continuing mission to provide you with exceptional heart care, we have created designated Provider Care Teams.  These Care Teams include your primary Cardiologist  (physician) and Advanced Practice Providers (APPs -  Physician Assistants and Nurse Practitioners) who all work together to provide you with the care you need, when you need it. You will need a follow up appointment in 2-3 weeks after stress test. You may see Chrystie Nose, MD or one of the following Advanced Practice Providers on your designated Care Team: [place], [place] . Micah Flesher, PA-C  Any Other Special Instructions Will Be Listed Below (If Applicable).

## 2018-01-04 ENCOUNTER — Telehealth (HOSPITAL_COMMUNITY): Payer: Self-pay | Admitting: *Deleted

## 2018-01-04 NOTE — Telephone Encounter (Signed)
Error

## 2018-01-07 DIAGNOSIS — H5712 Ocular pain, left eye: Secondary | ICD-10-CM | POA: Diagnosis not present

## 2018-01-11 ENCOUNTER — Ambulatory Visit: Payer: Commercial Managed Care - PPO | Admitting: Internal Medicine

## 2018-01-12 ENCOUNTER — Telehealth: Payer: Self-pay | Admitting: *Deleted

## 2018-01-12 NOTE — Telephone Encounter (Signed)
Left message on voicemail per DPR in reference to upcoming appointment scheduled on 01/15/18 at 1000 with detailed instructions given per Myocardial Perfusion Study Information Sheet for the test. LM to arrive 15 minutes early, and that it is imperative to arrive on time for appointment to keep from having the test rescheduled. If you need to cancel or reschedule your appointment, please call the office within 24 hours of your appointment. Failure to do so may result in a cancellation of your appointment, and a $50 no show fee. Phone number given for call back for any questions. Zorianna Taliaferro, Adelene Idler

## 2018-01-15 ENCOUNTER — Encounter (HOSPITAL_COMMUNITY): Payer: Commercial Managed Care - PPO

## 2018-01-15 ENCOUNTER — Ambulatory Visit (HOSPITAL_COMMUNITY): Payer: Commercial Managed Care - PPO | Attending: Cardiology

## 2018-01-15 DIAGNOSIS — R5383 Other fatigue: Secondary | ICD-10-CM

## 2018-01-15 DIAGNOSIS — I200 Unstable angina: Secondary | ICD-10-CM

## 2018-01-15 DIAGNOSIS — Z951 Presence of aortocoronary bypass graft: Secondary | ICD-10-CM

## 2018-01-15 LAB — MYOCARDIAL PERFUSION IMAGING
LV dias vol: 54 mL (ref 46–106)
LV sys vol: 17 mL
Peak HR: 109 {beats}/min
Rest HR: 64 {beats}/min
SDS: 1
SRS: 0
SSS: 1
TID: 0.84

## 2018-01-15 MED ORDER — TECHNETIUM TC 99M TETROFOSMIN IV KIT
32.1000 | PACK | Freq: Once | INTRAVENOUS | Status: AC | PRN
  Administered 2018-01-15: 32.1 via INTRAVENOUS
  Filled 2018-01-15: qty 33

## 2018-01-15 MED ORDER — TECHNETIUM TC 99M TETROFOSMIN IV KIT
10.3000 | PACK | Freq: Once | INTRAVENOUS | Status: AC | PRN
  Administered 2018-01-15: 10.3 via INTRAVENOUS
  Filled 2018-01-15: qty 11

## 2018-01-15 MED ORDER — REGADENOSON 0.4 MG/5ML IV SOLN
0.4000 mg | Freq: Once | INTRAVENOUS | Status: AC
  Administered 2018-01-15: 0.4 mg via INTRAVENOUS

## 2018-01-19 DIAGNOSIS — M65342 Trigger finger, left ring finger: Secondary | ICD-10-CM | POA: Diagnosis not present

## 2018-02-08 DIAGNOSIS — M170 Bilateral primary osteoarthritis of knee: Secondary | ICD-10-CM | POA: Diagnosis not present

## 2018-02-08 DIAGNOSIS — M65342 Trigger finger, left ring finger: Secondary | ICD-10-CM | POA: Diagnosis not present

## 2018-02-19 ENCOUNTER — Ambulatory Visit: Payer: Commercial Managed Care - PPO | Admitting: Internal Medicine

## 2018-03-01 DIAGNOSIS — Z7984 Long term (current) use of oral hypoglycemic drugs: Secondary | ICD-10-CM | POA: Diagnosis not present

## 2018-03-01 DIAGNOSIS — E7800 Pure hypercholesterolemia, unspecified: Secondary | ICD-10-CM | POA: Diagnosis not present

## 2018-03-01 DIAGNOSIS — Z881 Allergy status to other antibiotic agents status: Secondary | ICD-10-CM | POA: Diagnosis not present

## 2018-03-01 DIAGNOSIS — Z791 Long term (current) use of non-steroidal anti-inflammatories (NSAID): Secondary | ICD-10-CM | POA: Diagnosis not present

## 2018-03-01 DIAGNOSIS — Z885 Allergy status to narcotic agent status: Secondary | ICD-10-CM | POA: Diagnosis not present

## 2018-03-01 DIAGNOSIS — Z882 Allergy status to sulfonamides status: Secondary | ICD-10-CM | POA: Diagnosis not present

## 2018-03-01 DIAGNOSIS — M65342 Trigger finger, left ring finger: Secondary | ICD-10-CM | POA: Diagnosis not present

## 2018-03-01 DIAGNOSIS — E119 Type 2 diabetes mellitus without complications: Secondary | ICD-10-CM | POA: Diagnosis not present

## 2018-03-01 DIAGNOSIS — Z888 Allergy status to other drugs, medicaments and biological substances status: Secondary | ICD-10-CM | POA: Diagnosis not present

## 2018-03-01 DIAGNOSIS — I10 Essential (primary) hypertension: Secondary | ICD-10-CM | POA: Diagnosis not present

## 2018-03-01 DIAGNOSIS — Z79899 Other long term (current) drug therapy: Secondary | ICD-10-CM | POA: Diagnosis not present

## 2018-03-05 DIAGNOSIS — Z882 Allergy status to sulfonamides status: Secondary | ICD-10-CM | POA: Diagnosis not present

## 2018-03-05 DIAGNOSIS — E119 Type 2 diabetes mellitus without complications: Secondary | ICD-10-CM | POA: Diagnosis not present

## 2018-03-05 DIAGNOSIS — I10 Essential (primary) hypertension: Secondary | ICD-10-CM | POA: Diagnosis not present

## 2018-03-05 DIAGNOSIS — E7800 Pure hypercholesterolemia, unspecified: Secondary | ICD-10-CM | POA: Diagnosis not present

## 2018-03-05 DIAGNOSIS — Z881 Allergy status to other antibiotic agents status: Secondary | ICD-10-CM | POA: Diagnosis not present

## 2018-03-05 DIAGNOSIS — M65342 Trigger finger, left ring finger: Secondary | ICD-10-CM | POA: Diagnosis not present

## 2018-04-03 DIAGNOSIS — Z881 Allergy status to other antibiotic agents status: Secondary | ICD-10-CM | POA: Diagnosis not present

## 2018-04-03 DIAGNOSIS — I10 Essential (primary) hypertension: Secondary | ICD-10-CM | POA: Diagnosis not present

## 2018-04-03 DIAGNOSIS — S098XXA Other specified injuries of head, initial encounter: Secondary | ICD-10-CM | POA: Diagnosis not present

## 2018-04-03 DIAGNOSIS — T1490XA Injury, unspecified, initial encounter: Secondary | ICD-10-CM | POA: Diagnosis not present

## 2018-04-03 DIAGNOSIS — S8001XA Contusion of right knee, initial encounter: Secondary | ICD-10-CM | POA: Diagnosis not present

## 2018-04-03 DIAGNOSIS — Z888 Allergy status to other drugs, medicaments and biological substances status: Secondary | ICD-10-CM | POA: Diagnosis not present

## 2018-04-03 DIAGNOSIS — Z886 Allergy status to analgesic agent status: Secondary | ICD-10-CM | POA: Diagnosis not present

## 2018-04-03 DIAGNOSIS — M25561 Pain in right knee: Secondary | ICD-10-CM | POA: Diagnosis not present

## 2018-04-03 DIAGNOSIS — S0990XA Unspecified injury of head, initial encounter: Secondary | ICD-10-CM | POA: Diagnosis not present

## 2018-04-03 DIAGNOSIS — S0083XA Contusion of other part of head, initial encounter: Secondary | ICD-10-CM | POA: Diagnosis not present

## 2018-04-03 DIAGNOSIS — E119 Type 2 diabetes mellitus without complications: Secondary | ICD-10-CM | POA: Diagnosis not present

## 2018-05-12 LAB — TSH: TSH: 1.47 (ref 0.41–5.90)

## 2018-05-12 LAB — BASIC METABOLIC PANEL
BUN: 14 (ref 4–21)
Creatinine: 0.8 (ref 0.5–1.1)

## 2018-05-12 LAB — HEMOGLOBIN A1C: Hemoglobin A1C: 5.8

## 2018-06-08 ENCOUNTER — Telehealth: Payer: Self-pay

## 2018-06-08 NOTE — Telephone Encounter (Signed)
Virtual Visit Pre-Appointment Phone Call  Steps For Call:  1. Confirm consent - "In the setting of the current Covid19 crisis, you are scheduled for a (phone or video) visit with your provider on (date) at (time).  Just as we do with many in-office visits, in order for you to participate in this visit, we must obtain consent.  If you'd like, I can send this to your mychart (if signed up) or email for you to review.  Otherwise, I can obtain your verbal consent now.  All virtual visits are billed to your insurance company just like a normal visit would be.  By agreeing to a virtual visit, we'd like you to understand that the technology does not allow for your provider to perform an examination, and thus may limit your provider's ability to fully assess your condition.  Finally, though the technology is pretty good, we cannot assure that it will always work on either your or our end, and in the setting of a video visit, we may have to convert it to a phone-only visit.  In either situation, we cannot ensure that we have a secure connection.  Are you willing to proceed?"  2. Give patient instructions for WebEx download to smartphone as below if video visit  3. Advise patient to be prepared with any vital sign or heart rhythm information, their current medicines, and a piece of paper and pen handy for any instructions they may receive the day of their visit  4. Inform patient they will receive a phone call 15 minutes prior to their appointment time (may be from unknown caller ID) so they should be prepared to answer  5. Confirm that appointment type is correct in Epic appointment notes (video vs telephone)    TELEPHONE CALL NOTE  Tamara Nunez has been deemed a candidate for a follow-up tele-health visit to limit community exposure during the Covid-19 pandemic. I spoke with the patient via phone to ensure availability of phone/video source, confirm preferred email & phone number, and discuss  instructions and expectations.  I reminded Tamara Nunez to be prepared with any vital sign and/or heart rhythm information that could potentially be obtained via home monitoring, at the time of her visit. I reminded Tamara Nunez to expect a phone call at the time of her visit if her visit.  Did the patient verbally acknowledge consent to treatment? YES  Dorris Fetch, CMA 06/08/2018 12:27 PM   DOWNLOADING THE WEBEX SOFTWARE TO SMARTPHONE  - If Apple, go to [employer] and type in WebEx in the search bar. Download [employer], the blue/green circle. The app is free but as with any other app downloads, their phone may require them to verify saved payment information or Apple password. The patient does NOT have to create an account.  - If Android, ask patient to go to [employer] and type in WebEx in the search bar. Download [employer], the blue/green circle. The app is free but as with any other app downloads, their phone may require them to verify saved payment information or Android password. The patient does NOT have to create an account.   CONSENT FOR TELE-HEALTH VISIT - PLEASE REVIEW  I hereby voluntarily request, consent and authorize [facility] and its employed or contracted physicians, physician assistants, nurse practitioners or other licensed health care professionals (the Practitioner), to provide me with telemedicine health care services (the "Services") as deemed necessary by the treating Practitioner. I  acknowledge and consent to receive the Services by the Practitioner via telemedicine. I understand that the telemedicine visit will involve communicating with the Practitioner through live audiovisual communication technology and the disclosure of certain medical information by electronic transmission. I acknowledge that I have been given the opportunity to request an in-person assessment or other available alternative prior to the telemedicine visit and  am voluntarily participating in the telemedicine visit.  I understand that I have the right to withhold or withdraw my consent to the use of telemedicine in the course of my care at any time, without affecting my right to future care or treatment, and that the Practitioner or I may terminate the telemedicine visit at any time. I understand that I have the right to inspect all information obtained and/or recorded in the course of the telemedicine visit and may receive copies of available information for a reasonable fee.  I understand that some of the potential risks of receiving the Services via telemedicine include:  Marland Kitchen Delay or interruption in medical evaluation due to technological equipment failure or disruption; . Information transmitted may not be sufficient (e.g. poor resolution of images) to allow for appropriate medical decision making by the Practitioner; and/or  . In rare instances, security protocols could fail, causing a breach of personal health information.  Furthermore, I acknowledge that it is my responsibility to provide information about my medical history, conditions and care that is complete and accurate to the best of my ability. I acknowledge that Practitioner's advice, recommendations, and/or decision may be based on factors not within their control, such as incomplete or inaccurate data provided by me or distortions of diagnostic images or specimens that may result from electronic transmissions. I understand that the practice of medicine is not an exact science and that Practitioner makes no warranties or guarantees regarding treatment outcomes. I acknowledge that I will receive a copy of this consent concurrently upon execution via email to the email address I last provided but may also request a printed copy by calling the office of [place].    I understand that my insurance will be billed for this visit.   I have read or had this consent read to me. . I understand the  contents of this consent, which adequately explains the benefits and risks of the Services being provided via telemedicine.  . I have been provided ample opportunity to ask questions regarding this consent and the Services and have had my questions answered to my satisfaction. . I give my informed consent for the services to be provided through the use of telemedicine in my medical care  By participating in this telemedicine visit I agree to the above.

## 2018-06-10 ENCOUNTER — Other Ambulatory Visit: Payer: Self-pay

## 2018-06-11 ENCOUNTER — Telehealth: Payer: Commercial Managed Care - PPO | Admitting: Physician Assistant

## 2018-06-11 ENCOUNTER — Telehealth (INDEPENDENT_AMBULATORY_CARE_PROVIDER_SITE_OTHER): Payer: Commercial Managed Care - PPO | Admitting: Physician Assistant

## 2018-06-11 ENCOUNTER — Encounter: Payer: Self-pay | Admitting: Physician Assistant

## 2018-06-11 VITALS — BP 126/64 | HR 66 | Ht 64.0 in | Wt 163.5 lb

## 2018-06-11 DIAGNOSIS — E119 Type 2 diabetes mellitus without complications: Secondary | ICD-10-CM | POA: Diagnosis not present

## 2018-06-11 DIAGNOSIS — I10 Essential (primary) hypertension: Secondary | ICD-10-CM

## 2018-06-11 DIAGNOSIS — E785 Hyperlipidemia, unspecified: Secondary | ICD-10-CM

## 2018-06-11 DIAGNOSIS — I25700 Atherosclerosis of coronary artery bypass graft(s), unspecified, with unstable angina pectoris: Secondary | ICD-10-CM | POA: Diagnosis not present

## 2018-06-11 DIAGNOSIS — I200 Unstable angina: Secondary | ICD-10-CM

## 2018-06-11 MED ORDER — AMLODIPINE BESYLATE 2.5 MG PO TABS: 2.5000 mg | ORAL_TABLET | Freq: Every day | ORAL | 3 refills | Status: DC

## 2018-06-11 NOTE — Patient Instructions (Signed)
Medication Instructions:  START Amlodipine 2.5 Mg daily  If you need a refill on your cardiac medications before your next appointment, please call your pharmacy.   Lab work:  None ordered at the time of the appointment   If you have labs (blood work) drawn today and your tests are completely normal, you will receive your results only by: Marland Kitchen MyChart Message (if you have MyChart) OR . A paper copy in the mail If you have any lab test that is abnormal or we need to change your treatment, we will call you to review the results.  Testing/Procedures:  None ordered at the time of this appointment   Follow-Up: At [name], you and your health needs are our priority.  As part of our continuing mission to provide you with exceptional heart care, we have created designated Provider Care Teams.  These Care Teams include your primary Cardiologist (physician) and Advanced Practice Providers (APPs -  Physician Assistants and Nurse Practitioners) who all work together to provide you with the care you need, when you need it. . Your physician recommends that you schedule a follow-up appointment in: 2 weeks with Tamara Nose, MD   Any Other Special Instructions Will Be Listed Below (If Applicable).  None

## 2018-06-11 NOTE — Progress Notes (Signed)
Virtual Visit via Telephone Note    Evaluation Performed:  Follow-up visit  This visit type was conducted due to national recommendations for restrictions regarding the COVID-19 Pandemic (e.g. social distancing).  This format is felt to be most appropriate for this patient at this time.  All issues noted in this document were discussed and addressed.  No physical exam was performed (except for noted visual exam findings with Video Visits).  Please refer to the patient's chart (MyChart message for video visits and phone note for telephone visits) for the patient's consent to telehealth for [facility].  Date:  06/11/2018   ID:  Tamara Nunez, DOB [birth year], MRN [id number]  Patient Location:  Home  Provider location:   [place]  PCP:  Theodoro Kos, MD  Cardiologist:  Chrystie Nose, MD  Electrophysiologist:  None   Chief Complaint:  Chest pain  History of Present Illness:    Tamara Nunez is a 63 y.o. female who presents via audio/video conferencing for a telehealth visit today.    She is a 63 year old female with past medical history of CAD s/p CABG 2006 (LIMA-LAD, SVG-distal LAD), hypertension, hyperlipidemia, DM 2, GERD, chronic back pain and fibromyalgia.  Cardiac catheterization performed on 09/26/2016 demonstrated 80% proximal RCA disease treated with DES, she was also found to have atretic LIMA to LAD with 100% occluded mid LAD.  Last cardiac catheterization on 05/06/2017 showed 90% ostial left circumflex lesion, 100% proximal to mid LAD occlusion, LIMA to LAD is known to be atretic, SVG to mid LAD is patent, EF greater than 65%.  Ostial left circumflex lesion was treated with PTCA/DES x1.  Due to recurrence of chest discomfort reported during office visit on 01/01/2018, patient underwent Myoview on 01/15/2018 which came back low risk with EF greater than 65%, no evidence of prior infarct or ischemia.  Based on previous office note, patient has chronic atypical chest pain.   She has history of headache on isosorbide.    Talking with the patient today, she continued to complain of intermittent chest pain under her left breast since last October.  This is somewhat different from her chest pain prior to her stent placement in February 2019 however the characteristic of the chest pain is similar to what she experienced back in October.  It does not occur every day but occur at least once every other day.  It is relieved by rest however there is no clear correlation with exertion.  She has been using nitro spray.  Since Christmas time, she also noticed worsening fatigue as well especially when she does something.  Her blood pressure and heart rate is stable.  At this time I cannot rule out worsening CAD as a cause for her chest discomfort.  She is currently on beta-blocker, Imdur and Ranexa, I will also add low-dose amlodipine 2.5 mg daily for antianginal purposes.  I recommended she follow-up in the office with Dr. Rennis Golden where EKG can be obtained.  If her symptoms persist despite recent negative Myoview, may need to consider go ahead and do a cardiac catheterization to definitively evaluate for underlying coronary artery disease.   The patient does not have symptoms concerning for COVID-19 infection (fever, chills, cough, or new shortness of breath).    Prior CV studies:   The following studies were reviewed today:  Cath 05/06/2017  Ost Cx lesion is 90% stenosed.  Prox LAD to Mid LAD lesion is 100% stenosed.  LIMA.  SVG graft was visualized by  angiography and is normal in caliber.  Prox Cx lesion is 20% stenosed.  Mid Cx lesion is 20% stenosed.  Prox RCA lesion is 10% stenosed.  Prox RCA to Mid RCA lesion is 20% stenosed.  A drug-eluting stent was successfully placed using a STENT SYNERGY DES 4X12.  Post intervention, there is a 0% residual stenosis.  The left ventricular systolic function is normal.  LV end diastolic pressure is normal.  The left  ventricular ejection fraction is greater than 65% by visual estimate.  There is no mitral valve regurgitation.  Ost 2nd Mrg lesion is 70% stenosed.   1. Triple vessel CAD s/p CABG with one patent bypass graft.  2. The LAD is chronically occluded in the mid segment. The left main has no disease. One diagonal branch is supplied in an antegrade fashion.  3. The LIMA to the LAD is known to be atretic and was not injected. The SVG to the mid LAD is patent.  4. The Circumflex is a large caliber vessel with a severe ostial stenosis.  5. Patent stent proximal RCA 6. Successful PTCA/DES x 1 ostium of the Circumflex artery.  7. Normal LV systolic function  Recommendations: Lifelong DAPT with ASA and Plavix. Continue beta blocker and statin.    Myoview 01/15/2018  Nuclear stress EF: 69%.  There was no ST segment deviation noted during stress.  The study is normal.  This is a low risk study.  The left ventricular ejection fraction is hyperdynamic (>65%).   Normal pharmacologic nuclear stress test with no evidence for prior infarct or ischemia. Normal LVEF.    Past Medical History:  Diagnosis Date   Arthritis    "in my back" (05/06/2017)   Chronic back pain    Coronary artery disease    a. s/p CABG in 2006 with LIMA-LAD, SVG-dLAD. b. cath in 2009 showing patent grafts. c. 09/2016: cath with occluded LAD but patent SVG-dLAD. LIMA was atretic. pRCA with 80% stenosis --> treated with DES   Fibromyalgia    GERD (gastroesophageal reflux disease)    Hyperlipidemia    Hypertension    Type II diabetes mellitus (HCC)    Past Surgical History:  Procedure Laterality Date   ABDOMINAL HERNIA REPAIR  2018   BACK SURGERY     CARDIAC CATHETERIZATION  01/13/2008   CORONARY ANGIOPLASTY WITH STENT PLACEMENT  09/26/2016   Prox RCA lesion, 80 %stenosed. DES    CORONARY ARTERY BYPASS GRAFT  12/23/2004   2 vessel - LIMA to LAD (atretic), SVG to distal LAD   CORONARY STENT  INTERVENTION N/A 09/26/2016   Procedure: Coronary Stent Intervention;  Surgeon: Marykay Lex, MD;  Location: [facility] INVASIVE CV LAB;  Service: Cardiovascular;  Laterality: N/A;   CORONARY STENT INTERVENTION N/A 05/06/2017   Procedure: CORONARY STENT INTERVENTION;  Surgeon: Kathleene Hazel, MD;  Location: [facility] INVASIVE CV LAB;  Service: Cardiovascular;  Laterality: N/A;   DOPPLER ECHOCARDIOGRAPHY  04/12/2008   EF 60%   GASTRIC BYPASS  2009   HERNIA REPAIR     LEFT HEART CATH AND CORONARY ANGIOGRAPHY N/A 05/06/2017   Procedure: LEFT HEART CATH AND CORONARY ANGIOGRAPHY;  Surgeon: Kathleene Hazel, MD;  Location: [facility] INVASIVE CV LAB;  Service: Cardiovascular;  Laterality: N/A;   LEFT HEART CATH AND CORS/GRAFTS ANGIOGRAPHY N/A 09/26/2016   Procedure: Left Heart Cath and Cors/Grafts Angiography;  Surgeon: Marykay Lex, MD;  Location: [facility] INVASIVE CV LAB;  Service: Cardiovascular;  Laterality: N/A;   NM MYOVIEW LTD  06/03/2012   EF 71%   POSTERIOR FUSION LUMBAR SPINE  1990s   TUBAL LIGATION     VAGINAL HYSTERECTOMY     "partial"     Current Meds  Medication Sig   acetaminophen (TYLENOL) 500 MG tablet Take 1,000 mg by mouth every 6 (six) hours as needed for moderate pain or headache.   albuterol (PROVENTIL HFA;VENTOLIN HFA) 108 (90 BASE) MCG/ACT inhaler Inhale 2 puffs into the lungs every 6 (six) hours as needed for wheezing.   aspirin EC 81 MG tablet Take 81 mg by mouth daily.   cetirizine (ZYRTEC) 10 MG tablet Take 10 mg by mouth as needed for allergies.    clopidogrel (PLAVIX) 75 MG tablet Take 1 tablet (75 mg total) by mouth daily with breakfast.   fluticasone (FLONASE) 50 MCG/ACT nasal spray Place 1 spray into both nostrils 2 (two) times daily.    furosemide (LASIX) 40 MG tablet Take 1 tablet (40 mg total) by mouth daily as needed for fluid or edema.   gabapentin (NEURONTIN) 600 MG tablet Take 1 tablet by mouth 3 (three) times daily.   isosorbide mononitrate  (IMDUR) 60 MG 24 hr tablet Take 1.5 tablets (90 mg total) by mouth 2 (two) times daily.   lidocaine (LIDODERM) 5 % Place 2 patches onto the skin daily as needed (for pain). Remove & Discard patch within 12 hours or as directed by MD    metFORMIN (GLUCOPHAGE) 500 MG tablet Take 0.5 tablets (250 mg total) by mouth 2 (two) times daily.   metoprolol tartrate (LOPRESSOR) 25 MG tablet Take 25 mg by mouth 2 (two) times daily.   nitroGLYCERIN (NITROLINGUAL) 0.4 MG/SPRAY spray Place 1 spray under the tongue every 5 (five) minutes as needed for chest pain.   pantoprazole (PROTONIX) 40 MG tablet Take 40 mg by mouth daily as needed (acid reflux).    polyethylene glycol powder (GLYCOLAX/MIRALAX) powder Take 1 Container by mouth daily as needed.   ramipril (ALTACE) 5 MG capsule Take 5 mg by mouth at bedtime.    ranolazine (RANEXA) 1000 MG SR tablet Take 1 tablet (1,000 mg total) by mouth 2 (two) times daily.   simvastatin (ZOCOR) 20 MG tablet Take 20 mg by mouth at bedtime.   vitamin B-12 (CYANOCOBALAMIN) 1000 MCG tablet Take 1,000 mcg by mouth daily.   vitamin E 400 UNIT capsule Take 400 Units by mouth daily.     Allergies:   Codeine; Other; Ciprofloxacin; Valsartan; Adhesive [tape]; and Sulfa antibiotics   Social History   Tobacco Use   Smoking status: Never Smoker   Smokeless tobacco: Never Used  Substance Use Topics   Alcohol use: No   Drug use: No     Family Hx: The patient's family history includes Cancer - Lung in her father; Heart disease in her mother.  ROS:   Please see the history of present illness.     All other systems reviewed and are negative.   Labs/Other Tests and Data Reviewed:    Recent Labs: 01/01/2018: ALT 17; BUN 15; Creatinine, Ser 0.86; Hemoglobin 10.0; Platelets 412; Potassium 4.6; Sodium 138; TSH 1.540   Recent Lipid Panel Lab Results  Component Value Date/Time   CHOL  04/15/2010 04:00 AM    129        ATP III CLASSIFICATION:  <200     mg/dL    Desirable  409-811  mg/dL   Borderline High  >=914    mg/dL   High  TRIG 15 04/15/2010 04:00 AM   HDL 70 04/15/2010 04:00 AM   CHOLHDL 1.8 04/15/2010 04:00 AM   LDLCALC  04/15/2010 04:00 AM    56        Total Cholesterol/HDL:CHD Risk Coronary Heart Disease Risk Table                     Men   Women  1/2 Average Risk   3.4   3.3  Average Risk       5.0   4.4  2 X Average Risk   9.6   7.1  3 X Average Risk  23.4   11.0        Use the calculated Patient Ratio above and the CHD Risk Table to determine the patient's CHD Risk.        ATP III CLASSIFICATION (LDL):  <100     mg/dL   Optimal  161-096  mg/dL   Near or Above                    Optimal  130-159  mg/dL   Borderline  045-409  mg/dL   High  >811     mg/dL   Very High    Wt Readings from Last 3 Encounters:  06/11/18 163 lb 8 oz (74.2 kg)  01/01/18 162 lb (73.5 kg)  11/02/17 165 lb 9.6 oz (75.1 kg)     Objective:    Vital Signs:  BP 126/64    Pulse 66    Ht  (1.626 m)    Wt 163 lb 8 oz (74.2 kg)    BMI 28.06 kg/m    Well nourished, well developed female in no acute distress.   ASSESSMENT & PLAN:    1.  Chest pain: She continued to have intermittent chest pain was worsening fatigue since October 2019.  She had a normal Myoview in November 2019 despite her ongoing symptoms.  Symptom has not resolved since.  She is on beta-blocker, Ranexa and the Imdur.  I added low-dose amlodipine.  She will need a reassessment in the cardiology office, if symptoms persist, may need to consider cardiac catheterization to definitively assess coronary anatomy.  She says her current chest pain is somewhat different from her previous chest pain prior to stent placement in February 2019 however the worsening fatigue is concerning.  2. CAD s/p CABG 2006: Previous cardiac catheterization in 2018 revealed atretic LIMA to LAD patent SVG to LAD.  She received a stent to her RCA in July 2018 and the subsequently underwent DES placement  in left circumflex by February 2019.  Otherwise she has been compliant with her aspirin and Plavix  3. Hypertension: Her blood pressure is normal.  She is on multiple antianginal medications.  I added amlodipine to her medical regimen and have her follow-up with Dr. Rennis Golden in the clinic for reassessment.  4. Hyperlipidemia: Continue Zocor 20 mg daily  5. DM 2: On metformin, managed by primary care provider  COVID-19 Education: The signs and symptoms of COVID-19 were discussed with the patient and how to seek care for testing (follow up with PCP or arrange E-visit).  The importance of social distancing was discussed today.  Patient Risk:   After full review of this patient's clinical status, I feel that they are at least moderate risk at this time.  Time:   Today, I have spent 15 minutes with the patient with telehealth technology discussing chest pain.  Medication Adjustments/Labs and Tests Ordered: Current medicines are reviewed at length with the patient today.  Concerns regarding medicines are outlined above.  Tests Ordered: No orders of the defined types were placed in this encounter.  Medication Changes: Meds ordered this encounter  Medications   amLODipine (NORVASC) 2.5 MG tablet    Sig: Take 1 tablet (2.5 mg total) by mouth daily.    Dispense:  180 tablet    Refill:  3    Disposition:  Follow up in 1 week(s)  Signed, Azalee Course, PA  06/11/2018 2:47 PM    [facility]

## 2018-06-14 ENCOUNTER — Telehealth: Payer: Self-pay

## 2018-06-14 NOTE — Telephone Encounter (Signed)
Per Azalee Course, PA and Dr. Rennis Golden, pt need to be seen on Dr. Blanchie Dessert next DOD day. Attempted to contact pt to schedule. Left message to call back.

## 2018-06-17 DIAGNOSIS — Z043 Encounter for examination and observation following other accident: Secondary | ICD-10-CM | POA: Diagnosis not present

## 2018-06-17 DIAGNOSIS — R42 Dizziness and giddiness: Secondary | ICD-10-CM | POA: Diagnosis not present

## 2018-06-17 DIAGNOSIS — R296 Repeated falls: Secondary | ICD-10-CM | POA: Diagnosis not present

## 2018-06-22 ENCOUNTER — Ambulatory Visit (INDEPENDENT_AMBULATORY_CARE_PROVIDER_SITE_OTHER): Payer: Commercial Managed Care - PPO | Admitting: Internal Medicine

## 2018-06-22 ENCOUNTER — Encounter: Payer: Self-pay | Admitting: Internal Medicine

## 2018-06-22 ENCOUNTER — Other Ambulatory Visit: Payer: Self-pay

## 2018-06-22 VITALS — BP 122/58 | HR 60 | Ht 64.0 in | Wt 168.0 lb

## 2018-06-22 DIAGNOSIS — Z7189 Other specified counseling: Secondary | ICD-10-CM

## 2018-06-22 DIAGNOSIS — Z951 Presence of aortocoronary bypass graft: Secondary | ICD-10-CM | POA: Diagnosis not present

## 2018-06-22 DIAGNOSIS — I4510 Unspecified right bundle-branch block: Secondary | ICD-10-CM | POA: Diagnosis not present

## 2018-06-22 DIAGNOSIS — Z9861 Coronary angioplasty status: Secondary | ICD-10-CM | POA: Diagnosis not present

## 2018-06-22 DIAGNOSIS — Z01812 Encounter for preprocedural laboratory examination: Secondary | ICD-10-CM | POA: Diagnosis not present

## 2018-06-22 DIAGNOSIS — I2510 Atherosclerotic heart disease of native coronary artery without angina pectoris: Secondary | ICD-10-CM

## 2018-06-22 DIAGNOSIS — I200 Unstable angina: Secondary | ICD-10-CM

## 2018-06-22 LAB — BASIC METABOLIC PANEL
BUN/Creatinine Ratio: 20 (ref 12–28)
BUN: 16 mg/dL (ref 8–27)
CO2: 22 mmol/L (ref 20–29)
Calcium: 9 mg/dL (ref 8.7–10.3)
Chloride: 105 mmol/L (ref 96–106)
Creatinine, Ser: 0.82 mg/dL (ref 0.57–1.00)
GFR calc Af Amer: 87 mL/min/{1.73_m2} (ref >59)
GFR calc non Af Amer: 76 mL/min/{1.73_m2} (ref >59)
Glucose: 86 mg/dL (ref 65–99)
Potassium: 5 mmol/L (ref 3.5–5.2)
Sodium: 141 mmol/L (ref 134–144)

## 2018-06-22 LAB — CBC
Hematocrit: 30.2 % — ABNORMAL LOW (ref 34.0–46.6)
Hemoglobin: 9.5 g/dL — ABNORMAL LOW (ref 11.1–15.9)
MCH: 24.5 pg — ABNORMAL LOW (ref 26.6–33.0)
MCHC: 31.5 g/dL (ref 31.5–35.7)
MCV: 78 fL — ABNORMAL LOW (ref 79–97)
Platelets: 353 10*3/uL (ref 150–450)
RBC: 3.88 x10E6/uL (ref 3.77–5.28)
RDW: 17.3 % — ABNORMAL HIGH (ref 11.7–15.4)
WBC: 5.1 10*3/uL (ref 3.4–10.8)

## 2018-06-22 NOTE — H&P (View-Only) (Signed)
° ° °OFFICE NOTE ° °Chief Complaint:  °Unstable angina ° °Primary Care Physician: °[name], [name], MD ° °HPI:  °Tamara Nunez  is a 63-year-old female with history of coronary disease. She had CABG in 2006, LIMA to LAD, saphenous vein graft to distal LAD. She had chest pain and some fatigue after that in 2009 and had a heart cath which showed widely patent grafts. She has continued to have atypical chest pain which worsens with positioning but seemed to improve with nitroglycerin spray. However, she was given isosorbide which she reports today has not helped her symptoms but reported it had helped her in the past. The main side effect is significant headache on isosorbide which is bothersome for her. She also takes Ranexa. She continues to have chest pain and heaviness mostly on her left side but is also a soreness. She says it is more sore when she pushes on the chest wall underneath the tail of the breast. She had a mammogram which currently was negative for any mass and, again, the symptoms do sound to be more musculoskeletal, possibly a radiculopathy either from the cervical root or perhaps a thoracic nerve that comes around the chest wall. She does have a history of reflux but has not had complaints regarding that and I had recommended a stress test to look for any reversible ischemia that could be causing her symptoms. The stress test performed on June 03, 2012, was low risk demonstrating no significant reversibility. The EKG did show some T-wave changes in 2, 3 and aVF and laterally V2 through V6 with Lexiscan that resolved post infusion. However, this is nonspecific.  ° °She returns today with no specific complaints. She occasionally gets chest discomfort from time to time however the symptoms are well managed. She is complaining of some numbness feeling in her fingers and occasionally notes that there is some color change in her fingers in cold weather, which sounds like Raynaud's phenomenon. ° °I saw  Tamara Nunez back in the office today. She tells that she's had 2 surgeries since I saw her last. In September she underwent back surgery and has had marked improvement in her pain. About 2-3 weeks ago she had surgery on her left thumb for a trigger finger. She recovered from both of these well. She continues to have minor anginal symptoms on and off which is about baseline for her in the past. She tells me that she's been more fatigued recently and was noted to have mild anemia on laboratory work performed for her surgeries. It was recommended that she go on iron but she has not started that. I've encouraged her to follow-up with her primary care physician for this. ° °Today for follow-up. She was last seen over a year ago and has recently been ill. She was just discharged from [facility] a few days ago after she developed small bowel obstruction and what sounds a Perforated diverticulum. She underwent exploratory laparotomy and was hospitalized for a period of time. It does not sound like she had any cardiac issues during that procedure. She does have some chronic fatigue complaints and reports she is very fatigued today. She says it's been has been getting progressively worse she does not think is related to her abdominal infection. ° °10/27/2016 ° °Tamara Nunez returns today for follow-up. She had noted progressive worsening of shortness of breath and chest discomfort which sound like typical angina and underwent left heart catheterization by Dr. Harding on 09/26/2016. This demonstrated a proximal RCA   lesion that was 80% stenosis. She had successful placement of a 2.5 x 8 mm drug-eluting stent. She was found to have an atretic LIMA to LAD with 100% occlusion of the mid LAD. Her saphenous vein graft to the distal LAD was normal. EF was 55-60%. She reports that she never had any significant improvement in her fatigue and discomfort. She still gets discomfort in the left chest which goes to her back. She said her  husband gave her a "bear hug" and she reported some pain in her mid back. This was reproducible today in the office. She also reports fatigue and discomfort in her chest with exertion that relieves with rest and is improved with sublingual nitroglycerin spray. ° °12/01/2016 ° °Melitza returns today for follow-up. I placed her on isosorbide 30 mg daily for recurrent chest discomfort. She still feels some heaviness in her chest however it is improved. She says it's now more bearable and is able to do activities. It has not completely resolved. She has not required any sublingual nitroglycerin spray. She initially had some headache but that is resolved. ° °03/11/2017 ° °Tamara Nunez was seen today in follow-up.  She still reports symptoms of angina.  She says her isosorbide wears off by about 1 or 2 PM and she has to take another one.  She then notes that it wears off around 7 at night.  She is interested in increasing the dose.  She is compliant with her ranolazine.  He has now been more than 6 months since her stenting.  She is considering radiofrequency ablation of her back for chronic back pain.  In order to accomplish that she need to come off of her Plavix. ° °07/27/2017 ° °Tamara Nunez returns today for follow-up.  She continues to have chest discomfort.  She feels like she does not get adequate relief from the isosorbide.  In addition she is on Ranexa.  She says that she used to have benefit from it when she was receiving the name Ranexa however she is not convinced that the generic Ranexa is providing the same benefit.  I explained to her that my knowledge of the medication from a recent interaction with a drug representative is that the company is no longer planning to make a brand-name medication, therefore she will have no other options for treatment.  With regards to isosorbide, I again stated that there is no benefit to taking the medication 3 days versus twice a day.  Perhaps there could be benefit from increase in the  dose.  I am not totally convinced that her symptoms are all angina.  She does have significant thoracic and lumbar spine disease and may be having some pain related to radiculopathy.  She was told to use a lidocaine patch however she does not use that often. ° °11/02/2017 ° °Kathleene was seen today in follow-up.  She reports stable angina without new or worsening chest pain.  She denies any additional sublingual nitroglycerin use.  Her main concern today is lower extremity swelling.  She has peripheral neuropathy, which may be related to diabetes or chronic low back pain which is more likely.  She in fact is planning on an injection to her back today.  She has been holding Plavix for 5 days.  I advised her to start her Plavix up again 48 hours after her injection.  Her concern is that the swelling makes it difficult for her to get her shoes on.  Typically is worse in the evening and gets   better in the morning.  I suspect this is related to neuropathy.  She denies any worsening shortness of breath, weight gain, orthopnea or other heart failure symptoms. ° °01/01/2018 ° °[place] is seen today again in follow-up.  Unfortunately she has had some worsening chest pain.  She had to stop her Plavix for a few days for procedure and noticed that she felt worse off of it.  Over the past several weeks she has had some crescendo angina pain.  Worse when she is doing housework or other activities.  Is physically exhausting for her and she has to stop.  She generally feels okay at rest.  She is also had to take nitroglycerin several times for the symptoms on top of her isosorbide.  Currently she is on 90 mg twice daily as well as renal Lindzen thousand milligrams twice daily.  She did have a stent back in February of this year.  It seems unlikely she has had any significant progression of her epicardial coronary disease however is noted to have small vessel disease as well.  The symptoms however concerning for unstable  angina. ° °06/22/2018 ° °Hagar is seen today in the office for symptoms concerning for unstable angina.  She had a video visit with Hao Meng, PA-C, last week.  She is currently on maximally tolerated antianginal therapy and describing what sounds like class III angina.  She is already on high-dose nitrate and Ranexa.  She was started on calcium channel blocker but had hypotension and felt very fatigued and imbalanced on the medication.  Her niece is a nurse and checked her blood pressure which was noted to be below 100 systolic.  She then discontinue the calcium channel blocker.  She said she did not have any anginal improvement.  Her symptoms are worse with exertion and are relieved by rest.  She notes that the symptoms are very similar to her symptoms prior to her previous coronary stent in February 2019.  She did have nuclear stress testing for similar symptoms that were not as severe in November 2019 which was negative for ischemia. ° °PMHx:  °Past Medical History:  °Diagnosis Date  °• Arthritis   ° "in my back" (05/06/2017)  °• Chronic back pain   °• Coronary artery disease   ° a. s/p CABG in 2006 with LIMA-LAD, SVG-dLAD. b. cath in 2009 showing patent grafts. c. 09/2016: cath with occluded LAD but patent SVG-dLAD. LIMA was atretic. pRCA with 80% stenosis --> treated with DES  °• Fibromyalgia   °• GERD (gastroesophageal reflux disease)   °• Hyperlipidemia   °• Hypertension   °• Type II diabetes mellitus (HCC)   ° ° °Past Surgical History:  °Procedure Laterality Date  °• ABDOMINAL HERNIA REPAIR  2018  °• BACK SURGERY    °• CARDIAC CATHETERIZATION  01/13/2008  °• CORONARY ANGIOPLASTY WITH STENT PLACEMENT  09/26/2016  ° ·Prox RCA lesion, 80 %stenosed. DES   °• CORONARY ARTERY BYPASS GRAFT  12/23/2004  ° 2 vessel - LIMA to LAD (atretic), SVG to distal LAD  °• CORONARY STENT INTERVENTION N/A 09/26/2016  ° Procedure: Coronary Stent Intervention;  Surgeon: Harding, David W, MD;  Location: [facility];  Service:  Cardiovascular;  Laterality: N/A;  °• CORONARY STENT INTERVENTION N/A 05/06/2017  ° Procedure: CORONARY STENT INTERVENTION;  Surgeon: McAlhany, Christopher D, MD;  Location: [facility];  Service: Cardiovascular;  Laterality: N/A;  °• DOPPLER ECHOCARDIOGRAPHY  04/12/2008  ° EF 60%  °• GASTRIC BYPASS    2009  °• HERNIA REPAIR    °• LEFT HEART CATH AND CORONARY ANGIOGRAPHY N/A 05/06/2017  ° Procedure: LEFT HEART CATH AND CORONARY ANGIOGRAPHY;  Surgeon: McAlhany, Christopher D, MD;  Location: [facility];  Service: Cardiovascular;  Laterality: N/A;  °• LEFT HEART CATH AND CORS/GRAFTS ANGIOGRAPHY N/A 09/26/2016  ° Procedure: Left Heart Cath and Cors/Grafts Angiography;  Surgeon: Harding, David W, MD;  Location: [facility];  Service: Cardiovascular;  Laterality: N/A;  °• NM MYOVIEW LTD  06/03/2012  ° EF 71%  °• POSTERIOR FUSION LUMBAR SPINE  1990s  °• TUBAL LIGATION    °• VAGINAL HYSTERECTOMY    ° "partial"  ° ° °FAMHx:  °Family History  °Problem Relation Age of Onset  °• Heart disease Mother   °• Cancer - Lung Father   ° ° °SOCHx:  ° reports that she has never smoked. She has never used smokeless tobacco. She reports that she does not drink alcohol or use drugs. ° °ALLERGIES:  °Allergies  °Allergen Reactions  °• Codeine Hives and Shortness Of Breath  °   °  °• Other Anaphylaxis  °  Almonds   °• Ciprofloxacin Itching  °• Valsartan Rash  °• Adhesive [Tape] Itching  °  "clear tape" used after surgery."took all her skin off".  Paper take is ok  °• Sulfa Antibiotics   °  nausea °nausea  ° ° °ROS: °Pertinent items noted in HPI and remainder of comprehensive ROS otherwise negative. ° °HOME MEDS: °Current Outpatient Medications  °Medication Sig Dispense Refill  °• acetaminophen (TYLENOL) 500 MG tablet Take 1,000 mg by mouth every 6 (six) hours as needed for moderate pain or headache.    °• albuterol (PROVENTIL HFA;VENTOLIN HFA) 108 (90 BASE) MCG/ACT inhaler Inhale 2 puffs into the lungs every 6 (six) hours as  needed for wheezing.    °• aspirin EC 81 MG tablet Take 81 mg by mouth daily.    °• cetirizine (ZYRTEC) 10 MG tablet Take 10 mg by mouth as needed for allergies.     °• clopidogrel (PLAVIX) 75 MG tablet Take 1 tablet (75 mg total) by mouth daily with breakfast. 90 tablet 3  °• fluticasone (FLONASE) 50 MCG/ACT nasal spray Place 1 spray into both nostrils 2 (two) times daily.     °• furosemide (LASIX) 40 MG tablet Take 1 tablet (40 mg total) by mouth daily as needed for fluid or edema. 90 tablet 3  °• gabapentin (NEURONTIN) 600 MG tablet Take 1 tablet by mouth 3 (three) times daily.    °• isosorbide mononitrate (IMDUR) 60 MG 24 hr tablet Take 1.5 tablets (90 mg total) by mouth 2 (two) times daily. 270 tablet 3  °• lidocaine (LIDODERM) 5 % Place 2 patches onto the skin daily as needed (for pain). Remove & Discard patch within 12 hours or as directed by MD     °• metFORMIN (GLUCOPHAGE) 500 MG tablet Take 0.5 tablets (250 mg total) by mouth 2 (two) times daily.    °• metoprolol tartrate (LOPRESSOR) 25 MG tablet Take 25 mg by mouth 2 (two) times daily.    °• nitroGLYCERIN (NITROLINGUAL) 0.4 MG/SPRAY spray Place 1 spray under the tongue every 5 (five) minutes as needed for chest pain. 12 g 1  °• pantoprazole (PROTONIX) 40 MG tablet Take 40 mg by mouth daily as needed (acid reflux).     °• polyethylene glycol powder (GLYCOLAX/MIRALAX) powder Take 1 Container by mouth daily as needed.    °• ramipril (  ALTACE) 5 MG capsule Take 5 mg by mouth at bedtime.     °• ranolazine (RANEXA) 1000 MG SR tablet Take 1 tablet (1,000 mg total) by mouth 2 (two) times daily. 56 tablet 0  °• simvastatin (ZOCOR) 20 MG tablet Take 20 mg by mouth at bedtime.    °• vitamin B-12 (CYANOCOBALAMIN) 1000 MCG tablet Take 1,000 mcg by mouth daily.    °• vitamin E 400 UNIT capsule Take 400 Units by mouth daily.    °• amLODipine (NORVASC) 2.5 MG tablet Take 1 tablet (2.5 mg total) by mouth daily. (Patient not taking: Reported on 06/22/2018) 180 tablet 3   ° °No current facility-administered medications for this visit.   ° ° °LABS/IMAGING: °No results found for this or any previous visit (from the past 48 hour(s)). °No results found. ° °VITALS: °BP (!) 122/58 (BP Location: Right Arm, Patient Position: Sitting, Cuff Size: Normal)    Pulse 60    Ht 5' 4" (1.626 m)    Wt 168 lb (76.2 kg)    BMI 28.84 kg/m²  ° °EXAM: °General appearance: alert and no distress °Neck: no carotid bruit, no JVD and thyroid not enlarged, symmetric, no tenderness/mass/nodules °Lungs: clear to auscultation bilaterally °Heart: regular rate and rhythm, S1, S2 normal, no murmur, click, rub or gallop °Abdomen: soft, non-tender; bowel sounds normal; no masses,  no organomegaly °Extremities: extremities normal, atraumatic, no cyanosis or edema °Pulses: 2+ and symmetric °Skin: Skin color, texture, turgor normal. No rashes or lesions °Neurologic: Grossly normal °Psych: Mildly anxious ° °EKG: °Sinus rhythm, RBBB, no ischemic changes -personally reviewed ° °ASSESSMENT: °1. Unstable angina °2. Recent PCI with DES to the ostial circumflex with a 4 x 12 mm Synergy DES (04/2017) °3. Coronary artery disease status post CABG (LIMA to LAD-atretic, SVG to distal LAD) °4. Hypertension-controlled °5. Dyslipidemia-on statin °6. Diabetes type 2 °7. Tingling and coolness of the fingers, concerning for Raynaud's phenomena °8. Recent small bowel obstruction and status post exploratory laparotomy °9. RBBB °10. Lower extremity edema-secondary to neuropathy °11. Chronic back pain with peripheral neuropathy ° °PLAN: °1.   Mrs. Aronoff continues to have worsening chest pain with exertion.  She feels fatigued, she cannot do her normal activities.  Her symptoms are very similar to her coronary symptoms prior to stenting in February 2019.  She says after that stent she felt more like herself and was able to do most everything up until the fall of last year.  That is when we did a stress test which was negative for ischemia.   Despite titration of antianginal medications to the point where she became hypotensive and symptomatic, she continues to have chest pain.  She does have significant neuropathy on gabapentin and some of her chest pain is reproducible, but she says that is different than her exertional symptoms.  There is concern for unstable angina.  She is interested in proceeding with heart catheterization.  We discussed the risk, benefits and alternatives of heart catheterization and she is willing to proceed.  If the catheterization does not show any targets for revascularization, then we will may be need to consider alternative pain medications to try to treat her angina. ° °We will plan follow-up in 1 month by video visit. ° °Devron Cohick C. Curvin Hunger, MD, FACC, FACP  °[place]   [facility]  °Medical Director of the [facility] &  °Cardi[facility] Reduction [facility] °Attending Cardiologist  °Direct Dial: [phone]   Fax: [phone]  °Website:  [email or web address] ° °Gilford Lardizabal C   Tajah Noguchi °06/22/2018, 10:59 AM °

## 2018-06-22 NOTE — Patient Instructions (Addendum)
Medication Instructions:  Your Physician recommend you continue on your current medication as directed.    If you need a refill on your cardiac medications before your next appointment, please call your pharmacy.   Lab work: Your physician recommends that you return for lab work today (CBC, BMP)  If you have labs (blood work) drawn today and your tests are completely normal, you will receive your results only by: Marland Kitchen MyChart Message (if you have MyChart) OR . A paper copy in the mail If you have any lab test that is abnormal or we need to change your treatment, we will call you to review the results.  Testing/Procedures: Your physician has requested that you have cardiac CT. Cardiac computed tomography (CT) is a painless test that uses an x-ray machine to take clear, detailed pictures of your heart. For further information please visit [email or web address] Please follow instruction sheet as given. [facility]   Follow-Up: Your physician recommends that you schedule a virtual follow-up appointment in 1 month with Dr. Rennis Golden.       [facility] [address] [place] [address] Dept: [phone] Loc: [phone]  Tamara Nunez  06/22/2018  You are scheduled for a Cardiac Catheterization on Friday, April 17 with Dr. Peter [place]  1. Please arrive at the [facility] (Main Entrance A) at [facility]: [address] [place], [place] [address] at 7:00 AM (This time is two hours before your procedure to ensure your preparation). Free valet parking service is available.   Special note: Every effort is made to have your procedure done on time. Please understand that emergencies sometimes delay scheduled procedures.  2. Diet: Do not eat solid foods after midnight.  The patient may have clear liquids until 5am upon the day of the procedure.  3. Labs: You will need to have blood drawn  today  4. Medication instructions in preparation for your procedure:   Contrast Allergy: No   Stop taking, Lasix (Furosemide)  Friday, April 17,  Do not take Diabetes Med Glucophage (Metformin) on the day of the procedure and HOLD 48 HOURS AFTER THE PROCEDURE.  On the morning of your procedure, take your Aspirin and any morning medicines NOT listed above.  You may use sips of water.  5. Plan for one night stay--bring personal belongings. 6. Bring a current list of your medications and current insurance cards. 7. You MUST have a responsible person to drive you home. 8. Someone MUST be with you the first 24 hours after you arrive home or your discharge will be delayed. 9. Please wear clothes that are easy to get on and off and wear slip-on shoes.  Thank you for allowing [place] to care for you!   -- [place] [facility]

## 2018-06-22 NOTE — Progress Notes (Signed)
OFFICE NOTE  Chief Complaint:  Unstable angina  Primary Care Physician: Theodoro Kos, MD  HPI:  Tamara Nunez  is a 63 year old female with history of coronary disease. She had CABG in 2006, LIMA to LAD, saphenous vein graft to distal LAD. She had chest pain and some fatigue after that in 2009 and had a heart cath which showed widely patent grafts. She has continued to have atypical chest pain which worsens with positioning but seemed to improve with nitroglycerin spray. However, she was given isosorbide which she reports today has not helped her symptoms but reported it had helped her in the past. The main side effect is significant headache on isosorbide which is bothersome for her. She also takes Ranexa. She continues to have chest pain and heaviness mostly on her left side but is also a soreness. She says it is more sore when she pushes on the chest wall underneath the tail of the breast. She had a mammogram which currently was negative for any mass and, again, the symptoms do sound to be more musculoskeletal, possibly a radiculopathy either from the cervical root or perhaps a thoracic nerve that comes around the chest wall. She does have a history of reflux but has not had complaints regarding that and I had recommended a stress test to look for any reversible ischemia that could be causing her symptoms. The stress test performed on June 03, 2012, was low risk demonstrating no significant reversibility. The EKG did show some T-wave changes in 2, 3 and aVF and laterally V2 through V6 with Lexiscan that resolved post infusion. However, this is nonspecific.   She returns today with no specific complaints. She occasionally gets chest discomfort from time to time however the symptoms are well managed. She is complaining of some numbness feeling in her fingers and occasionally notes that there is some color change in her fingers in cold weather, which sounds like Raynaud's phenomenon.  I saw  Tamara Nunez back in the office today. She tells that she's had 2 surgeries since I saw her last. In September she underwent back surgery and has had marked improvement in her pain. About 2-3 weeks ago she had surgery on her left thumb for a trigger finger. She recovered from both of these well. She continues to have minor anginal symptoms on and off which is about baseline for her in the past. She tells me that she's been more fatigued recently and was noted to have mild anemia on laboratory work performed for her surgeries. It was recommended that she go on iron but she has not started that. I've encouraged her to follow-up with her primary care physician for this.  Today for follow-up. She was last seen over a year ago and has recently been ill. She was just discharged from [facility] a few days ago after she developed small bowel obstruction and what sounds a Perforated diverticulum. She underwent exploratory laparotomy and was hospitalized for a period of time. It does not sound like she had any cardiac issues during that procedure. She does have some chronic fatigue complaints and reports she is very fatigued today. She says it's been has been getting progressively worse she does not think is related to her abdominal infection.  10/27/2016  Tamara Nunez returns today for follow-up. She had noted progressive worsening of shortness of breath and chest discomfort which sound like typical angina and underwent left heart catheterization by Dr. Herbie Baltimore on 09/26/2016. This demonstrated a proximal RCA  lesion that was 80% stenosis. She had successful placement of a 2.5 x 8 mm drug-eluting stent. She was found to have an atretic LIMA to LAD with 100% occlusion of the mid LAD. Her saphenous vein graft to the distal LAD was normal. EF was 55-60%. She reports that she never had any significant improvement in her fatigue and discomfort. She still gets discomfort in the left chest which goes to her back. She said her  husband gave her a "bear hug" and she reported some pain in her mid back. This was reproducible today in the office. She also reports fatigue and discomfort in her chest with exertion that relieves with rest and is improved with sublingual nitroglycerin spray.  12/01/2016  Tamara Nunez returns today for follow-up. I placed her on isosorbide 30 mg daily for recurrent chest discomfort. She still feels some heaviness in her chest however it is improved. She says it's now more bearable and is able to do activities. It has not completely resolved. She has not required any sublingual nitroglycerin spray. She initially had some headache but that is resolved.  03/11/2017  Tamara Nunez was seen today in follow-up.  She still reports symptoms of angina.  She says her isosorbide wears off by about 1 or 2 PM and she has to take another one.  She then notes that it wears off around 7 at night.  She is interested in increasing the dose.  She is compliant with her ranolazine.  He has now been more than 6 months since her stenting.  She is considering radiofrequency ablation of her back for chronic back pain.  In order to accomplish that she need to come off of her Plavix.  07/27/2017  Tamara Nunez returns today for follow-up.  She continues to have chest discomfort.  She feels like she does not get adequate relief from the isosorbide.  In addition she is on Ranexa.  She says that she used to have benefit from it when she was receiving the name Ranexa however she is not convinced that the generic Ranexa is providing the same benefit.  I explained to her that my knowledge of the medication from a recent interaction with a drug representative is that the company is no longer planning to make a brand-name medication, therefore she will have no other options for treatment.  With regards to isosorbide, I again stated that there is no benefit to taking the medication 3 days versus twice a day.  Perhaps there could be benefit from increase in the  dose.  I am not totally convinced that her symptoms are all angina.  She does have significant thoracic and lumbar spine disease and may be having some pain related to radiculopathy.  She was told to use a lidocaine patch however she does not use that often.  11/02/2017  Jaidan was seen today in follow-up.  She reports stable angina without new or worsening chest pain.  She denies any additional sublingual nitroglycerin use.  Her main concern today is lower extremity swelling.  She has peripheral neuropathy, which may be related to diabetes or chronic low back pain which is more likely.  She in fact is planning on an injection to her back today.  She has been holding Plavix for 5 days.  I advised her to start her Plavix up again 48 hours after her injection.  Her concern is that the swelling makes it difficult for her to get her shoes on.  Typically is worse in the evening and gets  better in the morning.  I suspect this is related to neuropathy.  She denies any worsening shortness of breath, weight gain, orthopnea or other heart failure symptoms.  01/01/2018  [name] is seen today again in follow-up.  Unfortunately she has had some worsening chest pain.  She had to stop her Plavix for a few days for procedure and noticed that she felt worse off of it.  Over the past several weeks she has had some crescendo angina pain.  Worse when she is doing housework or other activities.  Is physically exhausting for her and she has to stop.  She generally feels okay at rest.  She is also had to take nitroglycerin several times for the symptoms on top of her isosorbide.  Currently she is on 90 mg twice daily as well as renal Lindzen thousand milligrams twice daily.  She did have a stent back in February of this year.  It seems unlikely she has had any significant progression of her epicardial coronary disease however is noted to have small vessel disease as well.  The symptoms however concerning for unstable  angina.  06/22/2018  [name] is seen today in the office for symptoms concerning for unstable angina.  She had a video visit with Azalee CourseHao Meng, PA-C, last week.  She is currently on maximally tolerated antianginal therapy and describing what sounds like class III angina.  She is already on high-dose nitrate and Ranexa.  She was started on calcium channel blocker but had hypotension and felt very fatigued and imbalanced on the medication.  Her niece is a Engineer, civil (consulting)nurse and checked her blood pressure which was noted to be below 100 systolic.  She then discontinue the calcium channel blocker.  She said she did not have any anginal improvement.  Her symptoms are worse with exertion and are relieved by rest.  She notes that the symptoms are very similar to her symptoms prior to her previous coronary stent in February 2019.  She did have nuclear stress testing for similar symptoms that were not as severe in November 2019 which was negative for ischemia.  PMHx:  Past Medical History:  Diagnosis Date   Arthritis    "in my back" (05/06/2017)   Chronic back pain    Coronary artery disease    a. s/p CABG in 2006 with LIMA-LAD, SVG-dLAD. b. cath in 2009 showing patent grafts. c. 09/2016: cath with occluded LAD but patent SVG-dLAD. LIMA was atretic. pRCA with 80% stenosis --> treated with DES   Fibromyalgia    GERD (gastroesophageal reflux disease)    Hyperlipidemia    Hypertension    Type II diabetes mellitus (HCC)     Past Surgical History:  Procedure Laterality Date   ABDOMINAL HERNIA REPAIR  2018   BACK SURGERY     CARDIAC CATHETERIZATION  01/13/2008   CORONARY ANGIOPLASTY WITH STENT PLACEMENT  09/26/2016   Prox RCA lesion, 80 %stenosed. DES    CORONARY ARTERY BYPASS GRAFT  12/23/2004   2 vessel - LIMA to LAD (atretic), SVG to distal LAD   CORONARY STENT INTERVENTION N/A 09/26/2016   Procedure: Coronary Stent Intervention;  Surgeon: Marykay LexHarding, David W, MD;  [facility];  Service:  Cardiovascular;  Laterality: N/A;   CORONARY STENT INTERVENTION N/A 05/06/2017   Procedure: CORONARY STENT INTERVENTION;  Surgeon: Kathleene HazelMcAlhany, Christopher D, MD;  Location: [facility] INVASIVE CV LAB;  Service: Cardiovascular;  Laterality: N/A;   DOPPLER ECHOCARDIOGRAPHY  04/12/2008   EF 60%   GASTRIC BYPASS  2009   HERNIA REPAIR     LEFT HEART CATH AND CORONARY ANGIOGRAPHY N/A 05/06/2017   Procedure: LEFT HEART CATH AND CORONARY ANGIOGRAPHY;  Surgeon: Kathleene Hazel, MD;  Location: [facility] INVASIVE CV LAB;  Service: Cardiovascular;  Laterality: N/A;   LEFT HEART CATH AND CORS/GRAFTS ANGIOGRAPHY N/A 09/26/2016   Procedure: Left Heart Cath and Cors/Grafts Angiography;  Surgeon: Marykay Lex, MD;  Location: [facility] CV [facility];  Service: Cardiovascular;  Laterality: N/A;   NM MYOVIEW LTD  06/03/2012   EF 71%   POSTERIOR FUSION LUMBAR SPINE  1990s   TUBAL LIGATION     VAGINAL HYSTERECTOMY     "partial"    FAMHx:  Family History  Problem Relation Age of Onset   Heart disease Mother    Cancer - Lung Father     SOCHx:   reports that she has never smoked. She has never used smokeless tobacco. She reports that she does not drink alcohol or use drugs.  ALLERGIES:  Allergies  Allergen Reactions   Codeine Hives and Shortness Of Breath        Other Anaphylaxis    Almonds    Ciprofloxacin Itching   Valsartan Rash   Adhesive [Tape] Itching    "clear tape" used after surgery."took all her skin off".  Paper take is ok   Sulfa Antibiotics     nausea nausea    ROS: Pertinent items noted in HPI and remainder of comprehensive ROS otherwise negative.  HOME MEDS: Current Outpatient Medications  Medication Sig Dispense Refill   acetaminophen (TYLENOL) 500 MG tablet Take 1,000 mg by mouth every 6 (six) hours as needed for moderate pain or headache.     albuterol (PROVENTIL HFA;VENTOLIN HFA) 108 (90 BASE) MCG/ACT inhaler Inhale 2 puffs into the lungs every 6 (six) hours as  needed for wheezing.     aspirin EC 81 MG tablet Take 81 mg by mouth daily.     cetirizine (ZYRTEC) 10 MG tablet Take 10 mg by mouth as needed for allergies.      clopidogrel (PLAVIX) 75 MG tablet Take 1 tablet (75 mg total) by mouth daily with breakfast. 90 tablet 3   fluticasone (FLONASE) 50 MCG/ACT nasal spray Place 1 spray into both nostrils 2 (two) times daily.      furosemide (LASIX) 40 MG tablet Take 1 tablet (40 mg total) by mouth daily as needed for fluid or edema. 90 tablet 3   gabapentin (NEURONTIN) 600 MG tablet Take 1 tablet by mouth 3 (three) times daily.     isosorbide mononitrate (IMDUR) 60 MG 24 hr tablet Take 1.5 tablets (90 mg total) by mouth 2 (two) times daily. 270 tablet 3   lidocaine (LIDODERM) 5 % Place 2 patches onto the skin daily as needed (for pain). Remove & Discard patch within 12 hours or as directed by MD      metFORMIN (GLUCOPHAGE) 500 MG tablet Take 0.5 tablets (250 mg total) by mouth 2 (two) times daily.     metoprolol tartrate (LOPRESSOR) 25 MG tablet Take 25 mg by mouth 2 (two) times daily.     nitroGLYCERIN (NITROLINGUAL) 0.4 MG/SPRAY spray Place 1 spray under the tongue every 5 (five) minutes as needed for chest pain. 12 g 1   pantoprazole (PROTONIX) 40 MG tablet Take 40 mg by mouth daily as needed (acid reflux).      polyethylene glycol powder (GLYCOLAX/MIRALAX) powder Take 1 Container by mouth daily as needed.     ramipril (  ALTACE) 5 MG capsule Take 5 mg by mouth at bedtime.      ranolazine (RANEXA) 1000 MG SR tablet Take 1 tablet (1,000 mg total) by mouth 2 (two) times daily. 56 tablet 0   simvastatin (ZOCOR) 20 MG tablet Take 20 mg by mouth at bedtime.     vitamin B-12 (CYANOCOBALAMIN) 1000 MCG tablet Take 1,000 mcg by mouth daily.     vitamin E 400 UNIT capsule Take 400 Units by mouth daily.     amLODipine (NORVASC) 2.5 MG tablet Take 1 tablet (2.5 mg total) by mouth daily. (Patient not taking: Reported on 06/22/2018) 180 tablet 3    No current facility-administered medications for this visit.     LABS/IMAGING: No results found for this or any previous visit (from the past 48 hour(s)). No results found.  VITALS: BP (!) 122/58 (BP Location: Right Arm, Patient Position: Sitting, Cuff Size: Normal)    Pulse 60    Ht  (1.626 m)    Wt 168 lb (76.2 kg)    BMI 28.84 kg/m   EXAM: General appearance: alert and no distress Neck: no carotid bruit, no JVD and thyroid not enlarged, symmetric, no tenderness/mass/nodules Lungs: clear to auscultation bilaterally Heart: regular rate and rhythm, S1, S2 normal, no murmur, click, rub or gallop Abdomen: soft, non-tender; bowel sounds normal; no masses,  no organomegaly Extremities: extremities normal, atraumatic, no cyanosis or edema Pulses: 2+ and symmetric Skin: Skin color, texture, turgor normal. No rashes or lesions Neurologic: Grossly normal Psych: Mildly anxious  EKG: Sinus rhythm, RBBB, no ischemic changes -personally reviewed  ASSESSMENT: 1. Unstable angina 2. Recent PCI with DES to the ostial circumflex with a 4 x 12 mm Synergy DES (04/2017) 3. Coronary artery disease status post CABG (LIMA to LAD-atretic, SVG to distal LAD) 4. Hypertension-controlled 5. Dyslipidemia-on statin 6. Diabetes type 2 7. Tingling and coolness of the fingers, concerning for Raynaud's phenomena 8. Recent small bowel obstruction and status post exploratory laparotomy 9. RBBB 10. Lower extremity edema-secondary to neuropathy 11. Chronic back pain with peripheral neuropathy  PLAN: 1.   Mrs. Willingham continues to have worsening chest pain with exertion.  She feels fatigued, she cannot do her normal activities.  Her symptoms are very similar to her coronary symptoms prior to stenting in February 2019.  She says after that stent she felt more like herself and was able to do most everything up until the fall of last year.  That is when we did a stress test which was negative for ischemia.   Despite titration of antianginal medications to the point where she became hypotensive and symptomatic, she continues to have chest pain.  She does have significant neuropathy on gabapentin and some of her chest pain is reproducible, but she says that is different than her exertional symptoms.  There is concern for unstable angina.  She is interested in proceeding with heart catheterization.  We discussed the risk, benefits and alternatives of heart catheterization and she is willing to proceed.  If the catheterization does not show any targets for revascularization, then we will may be need to consider alternative pain medications to try to treat her angina.  We will plan follow-up in 1 month by video visit.  Chrystie Nose, MD, [facility], FACP  [place]   [facility]  Medical Director of the [facility]  Cardiovascular Risk Reduction Clinic Attending Cardiologist  Direct Dial: [phone]   Fax: [phone]  Website:  [email or web address]  Lisette Abu  Wilberth Damon 06/22/2018, 10:59 AM

## 2018-06-24 ENCOUNTER — Telehealth: Payer: Self-pay | Admitting: *Deleted

## 2018-06-24 NOTE — Telephone Encounter (Addendum)
Pt contacted pre-catheterization scheduled at [facility] for: Friday June 25, 2018 9 AM Verified arrival time and place: [facility] Main Entrance A at:7 AM  No solid food after midnight prior to cath, clear liquids until 5 AM day of procedure. Contrast allergy: no  Hold: Metformin-day of procedure and 48 hours post procedure. Lasix-AM of procedure  Except hold medications AM meds can be  taken pre-cath with sip of water including: ASA 81 mg Clopidogrel 75 mg    Confirmed patient has responsible person to drive home post procedure and observe 24 hours after arriving home: yes     Cardiac Questionnaire: _____________   COVID-19 Pre-Screening Questions:  . Do you currently have a fever? no . Have you recently travelled on a cruise, internationally, or to [place], [place], [place], [place], [place], or [place], [place] [employer]) ? no . Have you been in contact with someone that is currently pending confirmation of Covid19 testing or has been confirmed to have the Covid19 virus? no . Are you currently experiencing fatigue or cough? No . Are you currently experiencing new or worsening shortness of breath at rest or with minimal activity? No . Have you been in contact with someone that was recently sick with fever/cough/fatigue? No   Pt advised due to Covid-19 pandemic, [facility] is restricting visitors and only patients should present for check-in prior to their procedure. People will not be allowed to enter [facility] with the patient. At this time [facility] is not allowing visitors to all [facility] campuses.    I reviewed procedure instructions, visitor restrictions, Covid-19 screening questions with patient, she verbalized understanding.

## 2018-06-25 ENCOUNTER — Ambulatory Visit (HOSPITAL_COMMUNITY)
Admission: RE | Admit: 2018-06-25 | Discharge: 2018-06-26 | Disposition: A | Discharge status: Home / Self Care | Payer: Commercial Managed Care - PPO | Attending: Cardiology | Admitting: Cardiology

## 2018-06-25 ENCOUNTER — Encounter (HOSPITAL_COMMUNITY)
Admission: RE | Disposition: A | Discharge status: Home / Self Care | Payer: Self-pay | Source: Home / Self Care | Attending: Cardiology

## 2018-06-25 ENCOUNTER — Encounter (HOSPITAL_COMMUNITY): Payer: Self-pay | Admitting: Cardiology

## 2018-06-25 ENCOUNTER — Other Ambulatory Visit: Payer: Self-pay

## 2018-06-25 DIAGNOSIS — I4510 Unspecified right bundle-branch block: Secondary | ICD-10-CM | POA: Insufficient documentation

## 2018-06-25 DIAGNOSIS — Z7982 Long term (current) use of aspirin: Secondary | ICD-10-CM | POA: Diagnosis not present

## 2018-06-25 DIAGNOSIS — I200 Unstable angina: Secondary | ICD-10-CM

## 2018-06-25 DIAGNOSIS — Z888 Allergy status to other drugs, medicaments and biological substances status: Secondary | ICD-10-CM | POA: Diagnosis not present

## 2018-06-25 DIAGNOSIS — I25110 Atherosclerotic heart disease of native coronary artery with unstable angina pectoris: Secondary | ICD-10-CM

## 2018-06-25 DIAGNOSIS — Z882 Allergy status to sulfonamides status: Secondary | ICD-10-CM | POA: Insufficient documentation

## 2018-06-25 DIAGNOSIS — E1159 Type 2 diabetes mellitus with other circulatory complications: Secondary | ICD-10-CM

## 2018-06-25 DIAGNOSIS — Z8249 Family history of ischemic heart disease and other diseases of the circulatory system: Secondary | ICD-10-CM | POA: Diagnosis not present

## 2018-06-25 DIAGNOSIS — Z01812 Encounter for preprocedural laboratory examination: Secondary | ICD-10-CM

## 2018-06-25 DIAGNOSIS — K56609 Unspecified intestinal obstruction, unspecified as to partial versus complete obstruction: Secondary | ICD-10-CM | POA: Insufficient documentation

## 2018-06-25 DIAGNOSIS — E1140 Type 2 diabetes mellitus with diabetic neuropathy, unspecified: Secondary | ICD-10-CM | POA: Insufficient documentation

## 2018-06-25 DIAGNOSIS — M797 Fibromyalgia: Secondary | ICD-10-CM | POA: Diagnosis not present

## 2018-06-25 DIAGNOSIS — I25700 Atherosclerosis of coronary artery bypass graft(s), unspecified, with unstable angina pectoris: Secondary | ICD-10-CM | POA: Diagnosis present

## 2018-06-25 DIAGNOSIS — I2584 Coronary atherosclerosis due to calcified coronary lesion: Secondary | ICD-10-CM | POA: Insufficient documentation

## 2018-06-25 DIAGNOSIS — Z951 Presence of aortocoronary bypass graft: Secondary | ICD-10-CM

## 2018-06-25 DIAGNOSIS — Z881 Allergy status to other antibiotic agents status: Secondary | ICD-10-CM | POA: Diagnosis not present

## 2018-06-25 DIAGNOSIS — Z7951 Long term (current) use of inhaled steroids: Secondary | ICD-10-CM | POA: Diagnosis not present

## 2018-06-25 DIAGNOSIS — I2510 Atherosclerotic heart disease of native coronary artery without angina pectoris: Secondary | ICD-10-CM

## 2018-06-25 DIAGNOSIS — Z9884 Bariatric surgery status: Secondary | ICD-10-CM | POA: Diagnosis not present

## 2018-06-25 DIAGNOSIS — Z7984 Long term (current) use of oral hypoglycemic drugs: Secondary | ICD-10-CM | POA: Insufficient documentation

## 2018-06-25 DIAGNOSIS — Z9861 Coronary angioplasty status: Secondary | ICD-10-CM

## 2018-06-25 DIAGNOSIS — K219 Gastro-esophageal reflux disease without esophagitis: Secondary | ICD-10-CM | POA: Diagnosis not present

## 2018-06-25 DIAGNOSIS — Z79899 Other long term (current) drug therapy: Secondary | ICD-10-CM | POA: Diagnosis not present

## 2018-06-25 DIAGNOSIS — Z885 Allergy status to narcotic agent status: Secondary | ICD-10-CM | POA: Diagnosis not present

## 2018-06-25 DIAGNOSIS — I10 Essential (primary) hypertension: Secondary | ICD-10-CM | POA: Diagnosis not present

## 2018-06-25 DIAGNOSIS — M4690 Unspecified inflammatory spondylopathy, site unspecified: Secondary | ICD-10-CM | POA: Diagnosis not present

## 2018-06-25 DIAGNOSIS — Z90711 Acquired absence of uterus with remaining cervical stump: Secondary | ICD-10-CM | POA: Insufficient documentation

## 2018-06-25 DIAGNOSIS — E785 Hyperlipidemia, unspecified: Secondary | ICD-10-CM | POA: Diagnosis present

## 2018-06-25 DIAGNOSIS — Z7902 Long term (current) use of antithrombotics/antiplatelets: Secondary | ICD-10-CM | POA: Insufficient documentation

## 2018-06-25 DIAGNOSIS — R600 Localized edema: Secondary | ICD-10-CM | POA: Insufficient documentation

## 2018-06-25 DIAGNOSIS — Z955 Presence of coronary angioplasty implant and graft: Secondary | ICD-10-CM

## 2018-06-25 DIAGNOSIS — E119 Type 2 diabetes mellitus without complications: Secondary | ICD-10-CM

## 2018-06-25 HISTORY — PX: CORONARY STENT INTERVENTION: CATH118234

## 2018-06-25 HISTORY — PX: LEFT HEART CATH AND CORS/GRAFTS ANGIOGRAPHY: CATH118250

## 2018-06-25 LAB — GLUCOSE, CAPILLARY
Glucose-Capillary: 87 mg/dL (ref 70–99)
Glucose-Capillary: 63 mg/dL — ABNORMAL LOW (ref 70–99)
Glucose-Capillary: 127 mg/dL — ABNORMAL HIGH (ref 70–99)
Glucose-Capillary: 140 mg/dL — ABNORMAL HIGH (ref 70–99)

## 2018-06-25 LAB — POCT ACTIVATED CLOTTING TIME
Activated Clotting Time: 400 seconds
Activated Clotting Time: 406 seconds

## 2018-06-25 SURGERY — LEFT HEART CATH AND CORS/GRAFTS ANGIOGRAPHY
Anesthesia: LOCAL

## 2018-06-25 MED ORDER — FENTANYL CITRATE (PF) 100 MCG/2ML IJ SOLN
INTRAMUSCULAR | Status: AC
  Filled 2018-06-25: qty 2

## 2018-06-25 MED ORDER — HEPARIN (PORCINE) IN NACL 1000-0.9 UT/500ML-% IV SOLN
INTRAVENOUS | Status: DC | PRN
  Administered 2018-06-25 (×3): 500 mL

## 2018-06-25 MED ORDER — MIDAZOLAM HCL 2 MG/2ML IJ SOLN
INTRAMUSCULAR | Status: DC | PRN
  Administered 2018-06-25 (×3): 1 mg via INTRAVENOUS

## 2018-06-25 MED ORDER — SODIUM CHLORIDE 0.9 % IV SOLN
INTRAVENOUS | Status: DC | PRN
  Administered 2018-06-25: 1.75 mg/kg/h via INTRAVENOUS
  Administered 2018-06-25: 11:00:00

## 2018-06-25 MED ORDER — VITAMIN B-12 1000 MCG PO TABS
1000.0000 ug | ORAL_TABLET | Freq: Every day | ORAL | Status: DC
  Administered 2018-06-26: 1000 ug via ORAL
  Filled 2018-06-25: qty 1

## 2018-06-25 MED ORDER — SODIUM CHLORIDE 0.9% FLUSH: 3.0000 mL | INTRAVENOUS | Status: DC | PRN

## 2018-06-25 MED ORDER — CLOPIDOGREL BISULFATE 75 MG PO TABS
75.0000 mg | ORAL_TABLET | Freq: Every day | ORAL | Status: DC
  Administered 2018-06-26: 75 mg via ORAL
  Filled 2018-06-25: qty 1

## 2018-06-25 MED ORDER — ISOSORBIDE MONONITRATE ER 60 MG PO TB24
90.0000 mg | ORAL_TABLET | Freq: Two times a day (BID) | ORAL | Status: DC
  Administered 2018-06-25 – 2018-06-26 (×2): 90 mg via ORAL
  Filled 2018-06-25 (×2): qty 1

## 2018-06-25 MED ORDER — HEPARIN (PORCINE) IN NACL 1000-0.9 UT/500ML-% IV SOLN
INTRAVENOUS | Status: AC
  Filled 2018-06-25: qty 500

## 2018-06-25 MED ORDER — SODIUM CHLORIDE 0.9 % IV SOLN: 250.0000 mL | INTRAVENOUS | Status: DC | PRN

## 2018-06-25 MED ORDER — GABAPENTIN 600 MG PO TABS
600.0000 mg | ORAL_TABLET | Freq: Three times a day (TID) | ORAL | Status: DC
  Administered 2018-06-25 – 2018-06-26 (×3): 600 mg via ORAL
  Filled 2018-06-25 (×3): qty 1

## 2018-06-25 MED ORDER — SODIUM CHLORIDE 0.9 % WEIGHT BASED INFUSION: 1.0000 mL/kg/h | INTRAVENOUS | Status: AC

## 2018-06-25 MED ORDER — LIDOCAINE HCL (PF) 1 % IJ SOLN
INTRAMUSCULAR | Status: AC
  Filled 2018-06-25: qty 30

## 2018-06-25 MED ORDER — IOHEXOL 350 MG/ML SOLN
INTRAVENOUS | Status: DC | PRN
  Administered 2018-06-25: 170 mL via INTRA_ARTERIAL

## 2018-06-25 MED ORDER — METOPROLOL TARTRATE 25 MG PO TABS
25.0000 mg | ORAL_TABLET | Freq: Two times a day (BID) | ORAL | Status: DC
  Administered 2018-06-25 – 2018-06-26 (×2): 25 mg via ORAL
  Filled 2018-06-25 (×2): qty 1

## 2018-06-25 MED ORDER — FENTANYL CITRATE (PF) 100 MCG/2ML IJ SOLN
INTRAMUSCULAR | Status: DC | PRN
  Administered 2018-06-25 (×4): 25 ug via INTRAVENOUS

## 2018-06-25 MED ORDER — HYDRALAZINE HCL 20 MG/ML IJ SOLN: 10.0000 mg | INTRAMUSCULAR | Status: AC | PRN

## 2018-06-25 MED ORDER — FLUTICASONE PROPIONATE 50 MCG/ACT NA SUSP
1.0000 | Freq: Every day | NASAL | Status: DC | PRN
  Filled 2018-06-25: qty 16

## 2018-06-25 MED ORDER — SODIUM CHLORIDE 0.9% FLUSH
3.0000 mL | Freq: Two times a day (BID) | INTRAVENOUS | Status: DC
  Administered 2018-06-25: 3 mL via INTRAVENOUS

## 2018-06-25 MED ORDER — SODIUM CHLORIDE 0.9 % IV SOLN
INTRAVENOUS | Status: DC
  Administered 2018-06-25: 07:00:00 via INTRAVENOUS

## 2018-06-25 MED ORDER — LIDOCAINE HCL (PF) 1 % IJ SOLN
INTRAMUSCULAR | Status: DC | PRN
  Administered 2018-06-25: 16 mL

## 2018-06-25 MED ORDER — ALBUTEROL SULFATE (2.5 MG/3ML) 0.083% IN NEBU: 2.5000 mg | INHALATION_SOLUTION | Freq: Four times a day (QID) | RESPIRATORY_TRACT | Status: DC | PRN

## 2018-06-25 MED ORDER — BIVALIRUDIN TRIFLUOROACETATE 250 MG IV SOLR
INTRAVENOUS | Status: AC
  Filled 2018-06-25: qty 250

## 2018-06-25 MED ORDER — MIDAZOLAM HCL 2 MG/2ML IJ SOLN
INTRAMUSCULAR | Status: AC
  Filled 2018-06-25: qty 2

## 2018-06-25 MED ORDER — LABETALOL HCL 5 MG/ML IV SOLN: 10.0000 mg | INTRAVENOUS | Status: AC | PRN

## 2018-06-25 MED ORDER — ACETAMINOPHEN 325 MG PO TABS: 650.0000 mg | ORAL_TABLET | ORAL | Status: DC | PRN

## 2018-06-25 MED ORDER — ONDANSETRON HCL 4 MG/2ML IJ SOLN: 4.0000 mg | Freq: Four times a day (QID) | INTRAMUSCULAR | Status: DC | PRN

## 2018-06-25 MED ORDER — SIMVASTATIN 20 MG PO TABS
20.0000 mg | ORAL_TABLET | Freq: Every day | ORAL | Status: DC
  Administered 2018-06-25: 20 mg via ORAL
  Filled 2018-06-25: qty 1

## 2018-06-25 MED ORDER — ASPIRIN EC 81 MG PO TBEC
81.0000 mg | DELAYED_RELEASE_TABLET | Freq: Every day | ORAL | Status: DC
  Administered 2018-06-26: 81 mg via ORAL
  Filled 2018-06-25: qty 1

## 2018-06-25 MED ORDER — PANTOPRAZOLE SODIUM 40 MG PO TBEC: 40.0000 mg | DELAYED_RELEASE_TABLET | Freq: Every day | ORAL | Status: DC | PRN

## 2018-06-25 MED ORDER — RAMIPRIL 5 MG PO CAPS
5.0000 mg | ORAL_CAPSULE | Freq: Every day | ORAL | Status: DC
  Administered 2018-06-25: 5 mg via ORAL
  Filled 2018-06-25 (×2): qty 1

## 2018-06-25 MED ORDER — NITROGLYCERIN 1 MG/10 ML FOR IR/CATH LAB
INTRA_ARTERIAL | Status: AC
  Filled 2018-06-25: qty 10

## 2018-06-25 MED ORDER — NITROGLYCERIN 1 MG/10 ML FOR IR/CATH LAB
INTRA_ARTERIAL | Status: DC | PRN
  Administered 2018-06-25 (×2): 200 ug via INTRACORONARY

## 2018-06-25 MED ORDER — RANOLAZINE ER 500 MG PO TB12
1000.0000 mg | ORAL_TABLET | Freq: Two times a day (BID) | ORAL | Status: DC
  Administered 2018-06-25 – 2018-06-26 (×2): 1000 mg via ORAL
  Filled 2018-06-25 (×2): qty 2

## 2018-06-25 MED ORDER — VITAMIN E 180 MG (400 UNIT) PO CAPS
400.0000 [IU] | ORAL_CAPSULE | Freq: Every day | ORAL | Status: DC
  Administered 2018-06-26: 400 [IU] via ORAL
  Filled 2018-06-25: qty 1

## 2018-06-25 MED ORDER — BIVALIRUDIN BOLUS VIA INFUSION - CUPID
INTRAVENOUS | Status: DC | PRN
  Administered 2018-06-25: 55.425 mg via INTRAVENOUS

## 2018-06-25 SURGICAL SUPPLY — 22 items
BALLN SAPPHIRE 1.5X10 (BALLOONS) ×2
BALLN SAPPHIRE 2.0X12 (BALLOONS) ×2
BALLN SAPPHIRE 2.5X12 (BALLOONS) ×2
BALLOON SAPPHIRE 1.5X10 (BALLOONS) IMPLANT
BALLOON SAPPHIRE 2.0X12 (BALLOONS) IMPLANT
BALLOON SAPPHIRE 2.5X12 (BALLOONS) IMPLANT
CATH INFINITI 5FR MULTPACK ANG (CATHETERS) ×1 IMPLANT
CATH TWIN PASS TORQ (CATHETERS) ×2 IMPLANT
CATH VISTA GUIDE 6FR XBLAD3.5 (CATHETERS) ×2 IMPLANT
KIT ENCORE 26 ADVANTAGE (KITS) ×1 IMPLANT
KIT HEART LEFT (KITS) ×2 IMPLANT
PACK CARDIAC CATHETERIZATION (CUSTOM PROCEDURE TRAY) ×2 IMPLANT
SHEATH PINNACLE 5F 10CM (SHEATH) ×1 IMPLANT
SHEATH PINNACLE 6F 10CM (SHEATH) ×1 IMPLANT
SHEATH PROBE COVER 6X72 (BAG) ×1 IMPLANT
STENT SYNERGY DES 2.5X16 (Permanent Stent) ×1 IMPLANT
TRANSDUCER W/STOPCOCK (MISCELLANEOUS) ×2 IMPLANT
TUBING CIL FLEX 10 FLL-RA (TUBING) ×2 IMPLANT
WIRE ASAHI GRAND SLAM 180CM (WIRE) ×1 IMPLANT
WIRE ASAHI PROWATER 180CM (WIRE) ×2 IMPLANT
WIRE EMERALD 3MM-J .035X150CM (WIRE) ×1 IMPLANT
WIRE HI TORQ BMW 300CM (WIRE) ×1 IMPLANT

## 2018-06-25 NOTE — Progress Notes (Signed)
Site area: rt groin fa sheath Site Prior to Removal:  Level 0 Pressure Applied For: 20 minutes Manual:   yes Patient Status During Pull:  stable Post Pull Site:  Level 0 Post Pull Instructions Given:  yes Post Pull Pulses Present: rt dp palpable Dressing Applied:  Gauze and tegaderm Bedrest begins @ 1330 Comments:

## 2018-06-25 NOTE — Interval H&P Note (Signed)
History and Physical Interval Note:  06/25/2018 8:45 AM  Tamara Nunez  has presented today for surgery, with the diagnosis of unstable angina.  The various methods of treatment have been discussed with the patient and family. After consideration of risks, benefits and other options for treatment, the patient has consented to  Procedure(s): LEFT HEART CATH AND CORS/GRAFTS ANGIOGRAPHY (N/A) as a surgical intervention.  The patient's history has been reviewed, patient examined, no change in status, stable for surgery.  I have reviewed the patient's chart and labs.  Questions were answered to the patient's satisfaction.    Cath Lab Visit (complete for each Cath Lab visit)  Clinical Evaluation Leading to the Procedure:   ACS: Yes.    Non-ACS:    Anginal Classification: CCS III  Anti-ischemic medical therapy: Maximal Therapy (2 or more classes of medications)  Non-Invasive Test Results: No non-invasive testing performed  Prior CABG: Previous CABG       Theron Arista [facility] 06/25/2018 8:45 AM

## 2018-06-26 ENCOUNTER — Other Ambulatory Visit: Payer: Self-pay | Admitting: Cardiology

## 2018-06-26 DIAGNOSIS — D649 Anemia, unspecified: Secondary | ICD-10-CM

## 2018-06-26 DIAGNOSIS — Z8249 Family history of ischemic heart disease and other diseases of the circulatory system: Secondary | ICD-10-CM | POA: Diagnosis not present

## 2018-06-26 DIAGNOSIS — R600 Localized edema: Secondary | ICD-10-CM | POA: Diagnosis not present

## 2018-06-26 DIAGNOSIS — M4690 Unspecified inflammatory spondylopathy, site unspecified: Secondary | ICD-10-CM | POA: Diagnosis not present

## 2018-06-26 DIAGNOSIS — E785 Hyperlipidemia, unspecified: Secondary | ICD-10-CM | POA: Diagnosis not present

## 2018-06-26 DIAGNOSIS — I25700 Atherosclerosis of coronary artery bypass graft(s), unspecified, with unstable angina pectoris: Secondary | ICD-10-CM

## 2018-06-26 DIAGNOSIS — I25110 Atherosclerotic heart disease of native coronary artery with unstable angina pectoris: Secondary | ICD-10-CM | POA: Diagnosis not present

## 2018-06-26 DIAGNOSIS — Z955 Presence of coronary angioplasty implant and graft: Secondary | ICD-10-CM | POA: Diagnosis not present

## 2018-06-26 DIAGNOSIS — I2584 Coronary atherosclerosis due to calcified coronary lesion: Secondary | ICD-10-CM | POA: Diagnosis not present

## 2018-06-26 DIAGNOSIS — M797 Fibromyalgia: Secondary | ICD-10-CM | POA: Diagnosis not present

## 2018-06-26 DIAGNOSIS — E1140 Type 2 diabetes mellitus with diabetic neuropathy, unspecified: Secondary | ICD-10-CM | POA: Diagnosis not present

## 2018-06-26 DIAGNOSIS — Z951 Presence of aortocoronary bypass graft: Secondary | ICD-10-CM | POA: Diagnosis not present

## 2018-06-26 DIAGNOSIS — I10 Essential (primary) hypertension: Secondary | ICD-10-CM | POA: Diagnosis not present

## 2018-06-26 DIAGNOSIS — K219 Gastro-esophageal reflux disease without esophagitis: Secondary | ICD-10-CM | POA: Diagnosis not present

## 2018-06-26 LAB — BASIC METABOLIC PANEL
Anion gap: 9 (ref 5–15)
BUN: 14 mg/dL (ref 8–23)
CO2: 25 mmol/L (ref 22–32)
Calcium: 8.7 mg/dL — ABNORMAL LOW (ref 8.9–10.3)
Chloride: 108 mmol/L (ref 98–111)
Creatinine, Ser: 0.91 mg/dL (ref 0.44–1.00)
GFR calc Af Amer: >60 mL/min (ref >60)
GFR calc non Af Amer: >60 mL/min (ref >60)
Glucose, Bld: 95 mg/dL (ref 70–99)
Potassium: 4.7 mmol/L (ref 3.5–5.1)
Sodium: 142 mmol/L (ref 135–145)

## 2018-06-26 LAB — CBC
HCT: 28.3 % — ABNORMAL LOW (ref 36.0–46.0)
Hemoglobin: 8.4 g/dL — ABNORMAL LOW (ref 12.0–15.0)
MCH: 23.7 pg — ABNORMAL LOW (ref 26.0–34.0)
MCHC: 29.7 g/dL — ABNORMAL LOW (ref 30.0–36.0)
MCV: 79.7 fL — ABNORMAL LOW (ref 80.0–100.0)
Platelets: 296 10*3/uL (ref 150–400)
RBC: 3.55 MIL/uL — ABNORMAL LOW (ref 3.87–5.11)
RDW: 18.8 % — ABNORMAL HIGH (ref 11.5–15.5)
WBC: 5.3 10*3/uL (ref 4.0–10.5)
nRBC: 0 % (ref 0.0–0.2)

## 2018-06-26 LAB — GLUCOSE, CAPILLARY
Glucose-Capillary: 82 mg/dL (ref 70–99)
Glucose-Capillary: 69 mg/dL — ABNORMAL LOW (ref 70–99)

## 2018-06-26 MED ORDER — METFORMIN HCL 500 MG PO TABS: 250.0000 mg | ORAL_TABLET | Freq: Two times a day (BID) | ORAL | Status: DC

## 2018-06-26 NOTE — Discharge Instructions (Addendum)
~~~~~ YOU NEED A CBC ON MONDAY OR TUESDAY PER LUKE KILROY PA (MAY COME TO [facility] OR YOUR PCP)~~~~  Coronary Angiogram With Stent, Care After This sheet gives you information about how to care for yourself after your procedure. Your health care provider may also give you more specific instructions. If you have problems or questions, contact your health care provider. What can I expect after the procedure? After your procedure, it is common to have:  Bruising in the area where a small, thin tube (catheter) was inserted. This usually fades within 1-2 weeks.  Blood collecting in the tissue (hematoma) that may be painful to the touch. It should usually decrease in size and tenderness within 1-2 weeks. Follow these instructions at home: Insertion area care  Do not take baths, swim, or use a hot tub until your health care provider approves.  You may shower 24-48 hours after the procedure or as directed by your health care provider.  Follow instructions from your health care provider about how to take care of your incision. Make sure you: ? Wash your hands with soap and water before you change your bandage (dressing). If soap and water are not available, use hand sanitizer. ? Change your dressing as told by your health care provider. ? Leave stitches (sutures), skin glue, or adhesive strips in place. These skin closures may need to stay in place for 2 weeks or longer. If adhesive strip edges start to loosen and curl up, you may trim the loose edges. Do not remove adhesive strips completely unless your health care provider tells you to do that.  Remove the bandage (dressing) and gently wash the catheter insertion site with plain soap and water.  Pat the area dry with a clean towel. Do not rub the area, because that may cause bleeding.  Do not apply powder or lotion to the incision area.  Check your incision area every day for signs of infection. Check for: ? More redness, swelling, or  pain. ? More fluid or blood. ? Warmth. ? Pus or a bad smell. Activity  Do not drive for 24 hours if you were given a medicine to help you relax (sedative).  Do not lift anything that is heavier than 10 lb (4.5 kg) for 5 days after your procedure or as directed by your health care provider.  Ask your health care provider when it is okay for you: ? To return to work or school. ? To resume usual physical activities or sports. ? To resume sexual activity. Eating and drinking   Eat a heart-healthy diet. This should include plenty of fresh fruits and vegetables.  Avoid the following types of food: ? Food that is high in salt. ? Canned or highly processed food. ? Food that is high in saturated fat or sugar. ? [employer] food.  Limit alcohol intake to no more than 1 drink a day for non-pregnant women and 2 drinks a day for men. One drink equals 12 oz of beer, 5 oz of wine, or 1 oz of hard liquor. Lifestyle   Do not use any products that contain nicotine or tobacco, such as cigarettes and e-cigarettes. If you need help quitting, ask your health care provider.  Take steps to manage and control your weight.  Get regular exercise.  Manage your blood pressure.  Manage other health problems, such as diabetes. General instructions  Take over-the-counter and prescription medicines only as told by your health care provider. Blood thinners may be prescribed after  your procedure to improve blood flow through the stent.  If you need an MRI after your heart stent has been placed, be sure to tell the health care provider who orders the MRI that you have a heart stent.  Keep all follow-up visits as directed by your health care provider. This is important. Contact a health care provider if:  You have a fever.  You have chills.  You have increased bleeding from the catheter insertion area. Hold pressure on the area. Get help right away if:  You develop chest pain or shortness of  breath.  You feel faint or you pass out.  You have unusual pain at the catheter insertion area.  You have redness, warmth, or swelling at the catheter insertion area.  You have drainage (other than a small amount of blood on the dressing) from the catheter insertion area.  The catheter insertion area is bleeding, and the bleeding does not stop after 30 minutes of holding steady pressure on the area.  You develop bleeding from any other place, such as from your rectum. There may be bright red blood in your urine or stool, or it may appear as black, tarry stool. This information is not intended to replace advice given to you by your health care provider. Make sure you discuss any questions you have with your health care provider. Document Released: 09/13/2004 Document Revised: 11/22/2015 Document Reviewed: 11/22/2015 Elsevier Interactive Patient Education  [employer].

## 2018-06-26 NOTE — Progress Notes (Signed)
Cardiac Rehab Advisory Cardiac Rehab Phase I is not seeing pts face to face at this time due to Covid 19 restrictions. Ambulation is occurring through nursing, PT, and mobility teams. We will help facilitate that process as needed. We are calling pts in their rooms and discussing education. We will then deliver education materials to pts RN for delivery to pt.  [phone] Pt educated by phone and voiced understanding. Has been on plavix before. Stressed importance of taking it. Reviewed NTG use, walking for ex, watching carbs and heart healthy food choices, and CRP 2. Referred to [place] CRP 2.  Materials will be given to RN. Pt stated does feel stressed at times. Encouraged her to discuss feelings with family and to do things that relax her when able to get back to normal activities. Encouraged her to discuss with MD if she feels that she needs medication. Luetta Nutting RN BSN 06/26/2018 9:38 AM  .

## 2018-06-26 NOTE — Progress Notes (Signed)
Progress Note  Patient Name: Tamara Nunez Date of Encounter: 06/26/2018  Primary Cardiologist: Chrystie NoseKenneth C Hilty, MD    Subjective   No angina   Inpatient Medications    Scheduled Meds: . aspirin EC  81 mg Oral Daily  . clopidogrel  75 mg Oral Q breakfast  . gabapentin  600 mg Oral TID  . isosorbide mononitrate  90 mg Oral BID  . metoprolol tartrate  25 mg Oral BID  . ramipril  5 mg Oral QHS  . ranolazine  1,000 mg Oral BID  . simvastatin  20 mg Oral QHS  . sodium chloride flush  3 mL Intravenous Q12H  . vitamin B-12  1,000 mcg Oral Daily  . vitamin E  400 Units Oral Daily   Continuous Infusions: . sodium chloride     PRN Meds: sodium chloride, acetaminophen, albuterol, fluticasone, ondansetron (ZOFRAN) IV, pantoprazole, sodium chloride flush   Vital Signs    Vitals:   06/25/18 2115 06/25/18 2127 06/26/18 0410 06/26/18 0414  BP: (!) 113/55  (!) 97/59   Pulse:  87 64   Resp:   16   Temp:  98.4 F (36.9 C) 97.8 F (36.6 C)   TempSrc:  Oral Oral   SpO2:  100% 100%   Weight:    76.4 kg  Height:        Intake/Output Summary (Last 24 hours) at 06/26/2018 0802 Last data filed at 06/26/2018 [id number] Gross per 24 hour  Intake 363 ml  Output 450 ml  Net -87 ml   Last 3 Weights 06/26/2018 06/25/2018 06/22/2018  Weight (lbs) 168 lb 6.4 oz 163 lb 168 lb  Weight (kg) 76.386 kg 73.936 kg 76.204 kg      Telemetry    NSR 06/26/2018  - Personally Reviewed  ECG    NSR no acute changes  - Personally Reviewed  Physical Exam  Right femoral artery cath sight A no hematoma GEN: No acute distress.   Neck: No JVD Cardiac: RRR, no murmurs, rubs, or gallops.  Respiratory: Clear to auscultation bilaterally. GI: Soft, nontender, non-distended  MS: No edema; No deformity. Neuro:  Nonfocal  Psych: Normal affect   Labs    Chemistry Recent Labs  Lab 06/22/18 1119 06/26/18 0401  NA 141 142  K 5.0 4.7  CL 105 108  CO2 22 25  GLUCOSE 86 95  BUN 16 14  CREATININE 0.82  0.91  CALCIUM 9.0 8.7*  GFRNONAA 76 >60  GFRAA 87 >60  ANIONGAP  --  9     Hematology Recent Labs  Lab 06/22/18 1119 06/26/18 0401  WBC 5.1 5.3  RBC 3.88 3.55*  HGB 9.5* 8.4*  HCT 30.2* 28.3*  MCV 78* 79.7*  MCH 24.5* 23.7*  MCHC 31.5 29.7*  RDW 17.3* 18.8*  PLT 353 296    Cardiac EnzymesNo results for input(s): TROPONINI in the last 168 hours. No results for input(s): TROPIPOC in the last 168 hours.   BNPNo results for input(s): BNP, PROBNP in the last 168 hours.   DDimer No results for input(s): DDIMER in the last 168 hours.   Radiology    No results found.  Cardiac Studies    Prox LAD to Mid LAD lesion is 100% stenosed.  Ost 2nd Mrg lesion is 70% stenosed.  Prox Cx lesion is 20% stenosed.  Mid Cx lesion is 20% stenosed.  Previously placed Ost Cx drug eluting stent is widely patent.  Prox RCA lesion is 10% stenosed.  Prox RCA  to Mid RCA lesion is 30% stenosed.  LIMA.  SVG and is normal in caliber.  Ost 1st Diag lesion is 90% stenosed.  Post intervention, there is a 0% residual stenosis.  A drug-eluting stent was successfully placed using a STENT SYNERGY DES 2.5X16.  The left ventricular systolic function is normal.  LV end diastolic pressure is normal.  The left ventricular ejection fraction is 55-65% by visual estimate.   1. Single vessel obstructive CAD    - 100% mid LAD. Vessel supplied by SVG    - 90% proximal first diagonal. This is a new lesion and felt to be the culprit vessel 2. Continued patency of stents in the ostial LCx and proximal RCA 3. Normal LV function 4. Normal LVEDP 5. Successful PCI of the first diagonal with DES x 1.  Plan: DAPT for at least a year. Given long difficult procedure and groin access we will observe overnight. Anticipate DC in am.  Patient Profile     63 y.o. CABG 2006 admitted with angina post cath   Assessment & Plan    CAD/CABG:  Culprit lesion native diagonal post DES Patent stent to ostial  circumflex patent [place] to mid LAD and SVG to distal continue DAT indefinitely nitrates and beta blocker  HLD:  Continue statin labs in 3 months   HTN:  Well controlled.  Continue current medications and low sodium Dash type diet.    D/c home today outpatient f/u Hilty   For questions or updates, please contact [facility] Please consult [email or web address] for contact info under        Signed, Charlton Haws, MD  06/26/2018, 8:02 AM

## 2018-06-26 NOTE — Discharge Summary (Addendum)
Discharge Summary    Patient ID: Tamara Nunez MRN: [id number]; DOB: 1956-02-15  Admit date: 06/25/2018 Discharge date: 06/26/2018  Primary Care Provider: Theodoro Kos, MD  Primary Cardiologist: Chrystie Nose, MD  Primary Electrophysiologist:  None   Discharge Diagnoses    Principal Problem:   Coronary artery disease involving coronary bypass graft of native heart with unstable angina pectoris [facility]) Active Problems:   CAD S/P percutaneous coronary angioplasty    S/P CABG x 2   HTN (hypertension)   Dyslipidemia   DM2 (diabetes mellitus, type 2) (HCC)   Unstable angina (HCC)   Allergies Allergies  Allergen Reactions  . Codeine Hives and Shortness Of Breath       . Other Anaphylaxis    Almonds   . Ciprofloxacin Itching  . Valsartan Rash  . Adhesive [Tape] Itching    "clear tape" used after surgery."took all her skin off".  Paper take is ok  . Sulfa Antibiotics     nausea nausea    Diagnostic Studies/Procedures    Cath/ Dx1 PCI 06/25/2018 _____________   History of Present Illness     [name]  With a history of prior CABG in 2006 and multiple post CABG caths and PCI- admitted with chest pain.  Hospital Course      Pleasant 63 year old female with a PMH of CAD s/p CABG 2006 (LIMA-LAD, SVG-distal LAD), hypertension, hyperlipidemia, DM 2, GERD, chronic back pain and fibromyalgia.  Cardiac catheterization performed on 09/26/2016 demonstrated 80% proximal RCA disease treated with DES, she was also found to have atretic LIMA to LAD with 100% occluded mid LAD.  Last cardiac catheterization on 05/06/2017 showed 90% ostial left circumflex lesion, 100% proximal to mid LAD occlusion, LIMA to LAD is known to be atretic, SVG to mid LAD is patent, EF greater than 65%.  Ostial left circumflex lesion was treated with PTCA/DES x1.  Due to recurrence of chest discomfort reported during office visit on 01/01/2018, patient underwent Myoview on 01/15/2018 which came back  low risk with EF greater than 65%, no evidence of prior infarct or ischemia.  Based on previous office note, patient has chronic atypical chest pain.  She has history of headache on isosorbide.    She saw Dr Rennis Golden in the office 06/22/2018 and complained of persistent anginal symptoms despite maximally tolerated medical Rx. She was set up for OP cath which was done 06/25/2018.  She had patent an atretic LIMA to the LAD and a patent  SVG to the distal LAD.  The prio CFX stent was patent.  The Dx1 had a 90% stenosis and was treated with PCI/ DES. She tolerated this well and Dr Eden Emms felt she could be discharged 06/26/2018.  TOC office visit to be arranged.  She will need a f/u CBC at discharge, Hgb 8.4 post procedure.  _____________  Discharge Vitals Blood pressure 108/65, pulse 61, temperature 97.8 F (36.6 C), temperature source Oral, resp. rate 16, height  (1.626 m), weight 76.4 kg, SpO2 100 %.  Filed Weights   06/25/18 0702 06/26/18 0414  Weight: 73.9 kg 76.4 kg    Labs & Radiologic Studies    CBC Recent Labs    06/26/18 0401  WBC 5.3  HGB 8.4*  HCT 28.3*  MCV 79.7*  PLT 296   Basic Metabolic Panel Recent Labs    40/98/11 0401  NA 142  K 4.7  CL 108  CO2 25  GLUCOSE 95  BUN 14  CREATININE 0.91  CALCIUM 8.7*   Liver Function Tests No results for input(s): AST, ALT, ALKPHOS, BILITOT, PROT, ALBUMIN in the last 72 hours. No results for input(s): LIPASE, AMYLASE in the last 72 hours. Cardiac Enzymes No results for input(s): CKTOTAL, CKMB, CKMBINDEX, TROPONINI in the last 72 hours. BNP Invalid input(s): POCBNP D-Dimer No results for input(s): DDIMER in the last 72 hours. Hemoglobin A1C No results for input(s): HGBA1C in the last 72 hours. Fasting Lipid Panel No results for input(s): CHOL, HDL, LDLCALC, TRIG, CHOLHDL, LDLDIRECT in the last 72 hours. Thyroid Function Tests No results for input(s): TSH, T4TOTAL, T3FREE, THYROIDAB in the last 72 hours.  Invalid  input(s): FREET3 _____________  No results found. Disposition   Pt is being discharged home today in good condition.  Follow-up Plans & Appointments    Follow-up Information    Hilty, Lisette AbuKenneth C, MD Follow up.   Specialty:  Cardiology Why:  Dr Blanchie DessertHilty's office will conatct you about an appointment Contact information: [address] [place] [id number] [phone]          Discharge Instructions    Amb Referral to Cardiac Rehabilitation   Complete by:  As directed    [facility] to [facility]   Diagnosis:  Coronary Stents      Discharge Medications   Allergies as of 06/26/2018      Reactions   Codeine Hives, Shortness Of Breath      Other Anaphylaxis   Almonds    Ciprofloxacin Itching   Valsartan Rash   Adhesive [tape] Itching   "clear tape" used after surgery."took all her skin off".  Paper take is ok   Sulfa Antibiotics    nausea nausea      Medication List    TAKE these medications   acetaminophen 500 MG tablet Commonly known as:  TYLENOL Take 1,000 mg by mouth every 6 (six) hours as needed for moderate pain or headache.   albuterol 108 (90 Base) MCG/ACT inhaler Commonly known as:  VENTOLIN HFA Inhale 2 puffs into the lungs every 6 (six) hours as needed for wheezing.   aspirin EC 81 MG tablet Take 81 mg by mouth daily.   cetirizine 10 MG tablet Commonly known as:  ZYRTEC Take 10 mg by mouth as needed for allergies.   clopidogrel 75 MG tablet Commonly known as:  PLAVIX Take 1 tablet (75 mg total) by mouth daily with breakfast.   fluticasone 50 MCG/ACT nasal spray Commonly known as:  FLONASE Place 1 spray into both nostrils daily as needed for allergies.   furosemide 40 MG tablet Commonly known as:  LASIX Take 1 tablet (40 mg total) by mouth daily as needed for fluid or edema.   gabapentin 600 MG tablet Commonly known as:  NEURONTIN Take 600 mg by mouth 3 (three) times daily.   isosorbide mononitrate 60 MG 24 hr  tablet Commonly known as:  IMDUR Take 1.5 tablets (90 mg total) by mouth 2 (two) times daily.   lidocaine 5 % Commonly known as:  LIDODERM Place 2 patches onto the skin daily as needed (for pain). Remove & Discard patch within 12 hours or as directed by MD   metFORMIN 500 MG tablet Commonly known as:  GLUCOPHAGE Take 0.5 tablets (250 mg total) by mouth 2 (two) times daily. Start taking on:  June 28, 2018 What changed:  These instructions start on June 28, 2018. If you are unsure what to do until then, ask your doctor or other care provider.  metoprolol tartrate 25 MG tablet Commonly known as:  LOPRESSOR Take 25 mg by mouth 2 (two) times daily.   nitroGLYCERIN 0.4 MG/SPRAY spray Commonly known as:  NITROLINGUAL Place 1 spray under the tongue every 5 (five) minutes as needed for chest pain.   pantoprazole 40 MG tablet Commonly known as:  PROTONIX Take 40 mg by mouth daily as needed (acid reflux).   polyethylene glycol powder 17 GM/SCOOP powder Commonly known as:  GLYCOLAX/MIRALAX Take 17 g by mouth daily.   ramipril 5 MG capsule Commonly known as:  ALTACE Take 5 mg by mouth at bedtime.   ranolazine 1000 MG SR tablet Commonly known as:  Ranexa Take 1 tablet (1,000 mg total) by mouth 2 (two) times daily.   simvastatin 20 MG tablet Commonly known as:  ZOCOR Take 20 mg by mouth at bedtime.   vitamin B-12 1000 MCG tablet Commonly known as:  CYANOCOBALAMIN Take 1,000 mcg by mouth daily.   vitamin E 400 UNIT capsule Take 400 Units by mouth daily.        Acute coronary syndrome (MI, NSTEMI, STEMI, etc) this admission?: Yes.     AHA/ACC Clinical Performance & Quality Measures: 1. Aspirin prescribed? - Yes 2. ADP Receptor Inhibitor (Plavix/Clopidogrel, Brilinta/Ticagrelor or Effient/Prasugrel) prescribed (includes medically managed patients)? - Yes 3. Beta Blocker prescribed? - Yes 4. High Intensity Statin (Lipitor 40-80mg  or Crestor 20-40mg ) prescribed? - No - On  Zocor 20 mg, intolerant to higher dose statin Rx 5. EF assessed during THIS hospitalization? - Yes 6. For EF <40%, was ACEI/ARB prescribed? - Not Applicable (EF >/= 40%) 7. For EF <40%, Aldosterone Antagonist (Spironolactone or Eplerenone) prescribed? - Not Applicable (EF >/= 40%) 8. Cardiac Rehab Phase II ordered (Included Medically managed Patients)? - Yes     Outstanding Labs/Studies   Pt will need f/u CBC after discharge- Hgb 8.4 post procedure.   Duration of Discharge Encounter   Greater than 30 minutes including physician time.  Signed, Corine Shelter, PA-C 06/26/2018, 1:19 PM

## 2018-06-28 ENCOUNTER — Telehealth: Payer: Self-pay | Admitting: *Deleted

## 2018-06-28 NOTE — Telephone Encounter (Signed)
Patient has a post hospita visit scheduled for 07/05/18 with Dr. Rennis Golden.  She was told at time of hospital discharge she would need to have lab work done because her blood count was low.  Can she wait unti she comes to the office next week to have this done ([place] lives in [place])?

## 2018-06-29 NOTE — Telephone Encounter (Signed)
She should have it done this week.  [place] can do in [place] and fax or call in the results.   Corine Shelter PA-C 06/29/2018 8:07 AM

## 2018-07-05 ENCOUNTER — Other Ambulatory Visit: Payer: Self-pay

## 2018-07-05 ENCOUNTER — Ambulatory Visit (INDEPENDENT_AMBULATORY_CARE_PROVIDER_SITE_OTHER): Payer: Commercial Managed Care - PPO | Admitting: Internal Medicine

## 2018-07-05 ENCOUNTER — Encounter: Payer: Self-pay | Admitting: Internal Medicine

## 2018-07-05 VITALS — BP 118/62 | HR 65 | Resp 12 | Ht 64.0 in | Wt 165.2 lb

## 2018-07-05 DIAGNOSIS — I10 Essential (primary) hypertension: Secondary | ICD-10-CM | POA: Diagnosis not present

## 2018-07-05 DIAGNOSIS — I25810 Atherosclerosis of coronary artery bypass graft(s) without angina pectoris: Secondary | ICD-10-CM | POA: Diagnosis not present

## 2018-07-05 DIAGNOSIS — D649 Anemia, unspecified: Secondary | ICD-10-CM

## 2018-07-05 DIAGNOSIS — Z79899 Other long term (current) drug therapy: Secondary | ICD-10-CM | POA: Diagnosis not present

## 2018-07-05 DIAGNOSIS — I200 Unstable angina: Secondary | ICD-10-CM

## 2018-07-05 DIAGNOSIS — I2510 Atherosclerotic heart disease of native coronary artery without angina pectoris: Secondary | ICD-10-CM

## 2018-07-05 DIAGNOSIS — I208 Other forms of angina pectoris: Secondary | ICD-10-CM

## 2018-07-05 DIAGNOSIS — I25700 Atherosclerosis of coronary artery bypass graft(s), unspecified, with unstable angina pectoris: Secondary | ICD-10-CM | POA: Diagnosis not present

## 2018-07-05 DIAGNOSIS — Z9861 Coronary angioplasty status: Secondary | ICD-10-CM | POA: Diagnosis not present

## 2018-07-05 NOTE — Patient Instructions (Signed)
Medication Instructions:  Your Physician recommend you continue on your current medication as directed.    If you need a refill on your cardiac medications before your next appointment, please call your pharmacy.   Lab work: Your physician recommends that you return for lab work today (CBC, Lipid, LPA)  If you have labs (blood work) drawn today and your tests are completely normal, you will receive your results only by: Marland Kitchen MyChart Message (if you have MyChart) OR . A paper copy in the mail If you have any lab test that is abnormal or we need to change your treatment, we will call you to review the results.  Testing/Procedures: None  Follow-Up: At [facility], you and your health needs are our priority.  As part of our continuing mission to provide you with exceptional heart care, we have created designated Provider Care Teams.  These Care Teams include your primary Cardiologist (physician) and Advanced Practice Providers (APPs -  Physician Assistants and Nurse Practitioners) who all work together to provide you with the care you need, when you need it. You will need a follow up appointment in 6 months.  Please call our office 2 months in advance to schedule this appointment.  You may see Chrystie Nose, MD or one of the following Advanced Practice Providers on your designated Care Team: [place], [place] . Micah Flesher, PA-C

## 2018-07-05 NOTE — Telephone Encounter (Signed)
Labs were drawn at today's office visit with Dr Rennis Golden.

## 2018-07-05 NOTE — Progress Notes (Signed)
OFFICE NOTE  Chief Complaint:  Follow-up heart cath  Primary Care Physician: Theodoro Kos, MD  HPI:  Tamara Nunez  is a 63 year old female with history of coronary disease. She had CABG in 2006, LIMA to LAD, saphenous vein graft to distal LAD. She had chest pain and some fatigue after that in 2009 and had a heart cath which showed widely patent grafts. She has continued to have atypical chest pain which worsens with positioning but seemed to improve with nitroglycerin spray. However, she was given isosorbide which she reports today has not helped her symptoms but reported it had helped her in the past. The main side effect is significant headache on isosorbide which is bothersome for her. She also takes Ranexa. She continues to have chest pain and heaviness mostly on her left side but is also a soreness. She says it is more sore when she pushes on the chest wall underneath the tail of the breast. She had a mammogram which currently was negative for any mass and, again, the symptoms do sound to be more musculoskeletal, possibly a radiculopathy either from the cervical root or perhaps a thoracic nerve that comes around the chest wall. She does have a history of reflux but has not had complaints regarding that and I had recommended a stress test to look for any reversible ischemia that could be causing her symptoms. The stress test performed on June 03, 2012, was low risk demonstrating no significant reversibility. The EKG did show some T-wave changes in 2, 3 and aVF and laterally V2 through V6 with Lexiscan that resolved post infusion. However, this is nonspecific.   She returns today with no specific complaints. She occasionally gets chest discomfort from time to time however the symptoms are well managed. She is complaining of some numbness feeling in her fingers and occasionally notes that there is some color change in her fingers in cold weather, which sounds like Raynaud's phenomenon.   I saw Danahi back in the office today. She tells that she's had 2 surgeries since I saw her last. In September she underwent back surgery and has had marked improvement in her pain. About 2-3 weeks ago she had surgery on her left thumb for a trigger finger. She recovered from both of these well. She continues to have minor anginal symptoms on and off which is about baseline for her in the past. She tells me that she's been more fatigued recently and was noted to have mild anemia on laboratory work performed for her surgeries. It was recommended that she go on iron but she has not started that. I've encouraged her to follow-up with her primary care physician for this.  Today for follow-up. She was last seen over a year ago and has recently been ill. She was just discharged from [facility] a few days ago after she developed small bowel obstruction and what sounds a Perforated diverticulum. She underwent exploratory laparotomy and was hospitalized for a period of time. It does not sound like she had any cardiac issues during that procedure. She does have some chronic fatigue complaints and reports she is very fatigued today. She says it's been has been getting progressively worse she does not think is related to her abdominal infection.  10/27/2016  Tamara Nunez returns today for follow-up. She had noted progressive worsening of shortness of breath and chest discomfort which sound like typical angina and underwent left heart catheterization by Dr. Herbie Baltimore on 09/26/2016. This demonstrated a proximal  RCA lesion that was 80% stenosis. She had successful placement of a 2.5 x 8 mm drug-eluting stent. She was found to have an atretic LIMA to LAD with 100% occlusion of the mid LAD. Her saphenous vein graft to the distal LAD was normal. EF was 55-60%. She reports that she never had any significant improvement in her fatigue and discomfort. She still gets discomfort in the left chest which goes to her back. She said  her husband gave her a "bear hug" and she reported some pain in her mid back. This was reproducible today in the office. She also reports fatigue and discomfort in her chest with exertion that relieves with rest and is improved with sublingual nitroglycerin spray.  12/01/2016  Tamara Nunez returns today for follow-up. I placed her on isosorbide 30 mg daily for recurrent chest discomfort. She still feels some heaviness in her chest however it is improved. She says it's now more bearable and is able to do activities. It has not completely resolved. She has not required any sublingual nitroglycerin spray. She initially had some headache but that is resolved.  03/11/2017  Tamara Nunez was seen today in follow-up.  She still reports symptoms of angina.  She says her isosorbide wears off by about 1 or 2 PM and she has to take another one.  She then notes that it wears off around 7 at night.  She is interested in increasing the dose.  She is compliant with her ranolazine.  He has now been more than 6 months since her stenting.  She is considering radiofrequency ablation of her back for chronic back pain.  In order to accomplish that she need to come off of her Plavix.  07/27/2017  Tamara Nunez returns today for follow-up.  She continues to have chest discomfort.  She feels like she does not get adequate relief from the isosorbide.  In addition she is on Ranexa.  She says that she used to have benefit from it when she was receiving the name Ranexa however she is not convinced that the generic Ranexa is providing the same benefit.  I explained to her that my knowledge of the medication from a recent interaction with a drug representative is that the company is no longer planning to make a brand-name medication, therefore she will have no other options for treatment.  With regards to isosorbide, I again stated that there is no benefit to taking the medication 3 days versus twice a day.  Perhaps there could be benefit from increase in the  dose.  I am not totally convinced that her symptoms are all angina.  She does have significant thoracic and lumbar spine disease and may be having some pain related to radiculopathy.  She was told to use a lidocaine patch however she does not use that often.  11/02/2017  Tamara Nunez was seen today in follow-up.  She reports stable angina without new or worsening chest pain.  She denies any additional sublingual nitroglycerin use.  Her main concern today is lower extremity swelling.  She has peripheral neuropathy, which may be related to diabetes or chronic low back pain which is more likely.  She in fact is planning on an injection to her back today.  She has been holding Plavix for 5 days.  I advised her to start her Plavix up again 48 hours after her injection.  Her concern is that the swelling makes it difficult for her to get her shoes on.  Typically is worse in the evening and  gets better in the morning.  I suspect this is related to neuropathy.  She denies any worsening shortness of breath, weight gain, orthopnea or other heart failure symptoms.  01/01/2018  Tamara Nunez is seen today again in follow-up.  Unfortunately she has had some worsening chest pain.  She had to stop her Plavix for a few days for procedure and noticed that she felt worse off of it.  Over the past several weeks she has had some crescendo angina pain.  Worse when she is doing housework or other activities.  Is physically exhausting for her and she has to stop.  She generally feels okay at rest.  She is also had to take nitroglycerin several times for the symptoms on top of her isosorbide.  Currently she is on 90 mg twice daily as well as renal Lindzen thousand milligrams twice daily.  She did have a stent back in February of this year.  It seems unlikely she has had any significant progression of her epicardial coronary disease however is noted to have small vessel disease as well.  The symptoms however concerning for unstable angina.   06/22/2018  Tamara Nunez is seen today in the office for symptoms concerning for unstable angina.  She had a video visit with Azalee Course, PA-C, last week.  She is currently on maximally tolerated antianginal therapy and describing what sounds like class III angina.  She is already on high-dose nitrate and Ranexa.  She was started on calcium channel blocker but had hypotension and felt very fatigued and imbalanced on the medication.  Her niece is a Engineer, civil (consulting) and checked her blood pressure which was noted to be below 100 systolic.  She then discontinue the calcium channel blocker.  She said she did not have any anginal improvement.  Her symptoms are worse with exertion and are relieved by rest.  She notes that the symptoms are very similar to her symptoms prior to her previous coronary stent in February 2019.  She did have nuclear stress testing for similar symptoms that were not as severe in November 2019 which was negative for ischemia.  07/05/2018  Tamara Nunez returns today for follow-up of her heart cath.  This is performed by Dr. Swaziland on 06/25/2018, which demonstrated single-vessel obstructive coronary disease with chronic total occlusion of the mid LAD, bypassed by a patent saphenous vein graft.  Additionally, there was a new 90% proximal first diagonal stenosis felt to be the culprit vessel.  This was successfully (albeit with difficulty) intervened upon with a drug-eluting stent x1.  She remains on dual antiplatelet therapy and had baseline anemia prior to the procedure.  Groin access was obtained, however she has had no signs or symptoms of bleeding, hematoma or pain in the groin since discharge.  She reports her chest pain is now resolved.  She still has some soreness in her chest wall, but otherwise has felt more like herself.  I am surprised at how aggressive her coronary disease has been.  Despite her having a well-controlled lipid profile with LDL well below 70.  She is only on moderate intensity statin.  I would  like to look further into why she has such aggressive coronary disease today.  PMHx:  Past Medical History:  Diagnosis Date  . Arthritis    "in my back" (05/06/2017)  . Chronic back pain   . Coronary artery disease    a. s/p CABG in 2006 with LIMA-LAD, SVG-dLAD. b. cath in 2009 showing patent grafts. c. 09/2016: cath with occluded  LAD but patent SVG-dLAD. LIMA was atretic. pRCA with 80% stenosis --> treated with DES  . Fibromyalgia   . GERD (gastroesophageal reflux disease)   . Hyperlipidemia   . Hypertension   . Type II diabetes mellitus (HCC)     Past Surgical History:  Procedure Laterality Date  . ABDOMINAL HERNIA REPAIR  2018  . BACK SURGERY    . CARDIAC CATHETERIZATION  01/13/2008  . CORONARY ANGIOPLASTY WITH STENT PLACEMENT  09/26/2016   Prox RCA lesion, 80 %stenosed. DES   . CORONARY ARTERY BYPASS GRAFT  12/23/2004   2 vessel - LIMA to LAD (atretic), SVG to distal LAD  . CORONARY STENT INTERVENTION N/A 09/26/2016   Procedure: Coronary Stent Intervention;  Surgeon: Marykay Lex, MD;  Location: [facility] INVASIVE CV LAB;  Service: Cardiovascular;  Laterality: N/A;  . CORONARY STENT INTERVENTION N/A 05/06/2017   Procedure: CORONARY STENT INTERVENTION;  Surgeon: Kathleene Hazel, MD;  Location: [facility] INVASIVE CV LAB;  Service: Cardiovascular;  Laterality: N/A;  . CORONARY STENT INTERVENTION N/A 06/25/2018   Procedure: CORONARY STENT INTERVENTION;  Surgeon: Swaziland, Peter M, MD;  [facility] INV[facility] CV LAB;  Service: Cardiovascular;  Laterality: N/A;  . DOPPLER ECHOCARDIOGRAPHY  04/12/2008   EF 60%  . GASTRIC BYPASS  2009  . HERNIA REPAIR    . LEFT HEART CATH AND CORONARY ANGIOGRAPHY N/A 05/06/2017   Procedure: LEFT HEART CATH AND CORONARY ANGIOGRAPHY;  Surgeon: Kathleene Hazel, MD;  Location: [facility] INVASIVE CV LAB;  Service: Cardiovascular;  Laterality: N/A;  . LEFT HEART CATH AND CORS/GRAFTS ANGIOGRAPHY N/A 09/26/2016   Procedure: Left Heart Cath and Cors/Grafts Angiography;   Surgeon: Marykay Lex, MD;  Location: [facility] INVASIVE CV LAB;  Service: Cardiovascular;  Laterality: N/A;  . LEFT HEART CATH AND CORS/GRAFTS ANGIOGRAPHY N/A 06/25/2018   Procedure: LEFT HEART CATH AND CORS/GRAFTS ANGIOGRAPHY;  Surgeon: Swaziland, Peter M, MD;  Location: [facility] INVASIVE CV LAB;  Service: Cardiovascular;  Laterality: N/A;  . NM MYOVIEW LTD  06/03/2012   EF 71%  . POSTERIOR FUSION LUMBAR SPINE  1990s  . TUBAL LIGATION    . VAGINAL HYSTERECTOMY     "partial"    FAMHx:  Family History  Problem Relation Age of Onset  . Heart disease Mother   . Cancer - Lung Father     SOCHx:   reports that she has never smoked. She has never used smokeless tobacco. She reports that she does not drink alcohol or use drugs.  ALLERGIES:  Allergies  Allergen Reactions  . Codeine Hives and Shortness Of Breath       . Other Anaphylaxis    Almonds   . Ciprofloxacin Itching  . Valsartan Rash  . Adhesive [Tape] Itching    "clear tape" used after surgery."took all her skin off".  Paper take is ok  . Sulfa Antibiotics     nausea nausea    ROS: Pertinent items noted in HPI and remainder of comprehensive ROS otherwise negative.  HOME MEDS: Current Outpatient Medications  Medication Sig Dispense Refill  . acetaminophen (TYLENOL) 500 MG tablet Take 1,000 mg by mouth every 6 (six) hours as needed for moderate pain or headache.    . albuterol (PROVENTIL HFA;VENTOLIN HFA) 108 (90 BASE) MCG/ACT inhaler Inhale 2 puffs into the lungs every 6 (six) hours as needed for wheezing.    Marland Kitchen aspirin EC 81 MG tablet Take 81 mg by mouth daily.    . cetirizine (ZYRTEC) 10 MG tablet Take 10 mg by  mouth as needed for allergies.     Marland Kitchen clopidogrel (PLAVIX) 75 MG tablet Take 1 tablet (75 mg total) by mouth daily with breakfast. 90 tablet 3  . fluticasone (FLONASE) 50 MCG/ACT nasal spray Place 1 spray into both nostrils daily as needed for allergies.     . furosemide (LASIX) 40 MG tablet Take 1 tablet (40 mg total) by  mouth daily as needed for fluid or edema. 90 tablet 3  . gabapentin (NEURONTIN) 600 MG tablet Take 600 mg by mouth 3 (three) times daily.     . isosorbide mononitrate (IMDUR) 60 MG 24 hr tablet Take 1.5 tablets (90 mg total) by mouth 2 (two) times daily. 270 tablet 3  . lidocaine (LIDODERM) 5 % Place 2 patches onto the skin daily as needed (for pain). Remove & Discard patch within 12 hours or as directed by MD     . metFORMIN (GLUCOPHAGE) 500 MG tablet Take 0.5 tablets (250 mg total) by mouth 2 (two) times daily.    . metoprolol tartrate (LOPRESSOR) 25 MG tablet Take 25 mg by mouth 2 (two) times daily.    . nitroGLYCERIN (NITROLINGUAL) 0.4 MG/SPRAY spray Place 1 spray under the tongue every 5 (five) minutes as needed for chest pain. 12 g 1  . pantoprazole (PROTONIX) 40 MG tablet Take 40 mg by mouth daily as needed (acid reflux).     . polyethylene glycol powder (GLYCOLAX/MIRALAX) powder Take 17 g by mouth daily.     . ramipril (ALTACE) 5 MG capsule Take 5 mg by mouth at bedtime.     . ranolazine (RANEXA) 1000 MG SR tablet Take 1 tablet (1,000 mg total) by mouth 2 (two) times daily. 56 tablet 0  . simvastatin (ZOCOR) 20 MG tablet Take 20 mg by mouth at bedtime.    . vitamin B-12 (CYANOCOBALAMIN) 1000 MCG tablet Take 1,000 mcg by mouth daily.    . vitamin E 400 UNIT capsule Take 400 Units by mouth daily.     No current facility-administered medications for this visit.     LABS/IMAGING: No results found for this or any previous visit (from the past 48 hour(s)). No results found.  VITALS: BP 118/62   Pulse 65   Resp 12   Ht  (1.626 m)   Wt 165 lb 3.2 oz (74.9 kg)   SpO2 99%   BMI 28.36 kg/m   EXAM: General appearance: alert and no distress Neck: no carotid bruit, no JVD and thyroid not enlarged, symmetric, no tenderness/mass/nodules Lungs: clear to auscultation bilaterally Heart: regular rate and rhythm, S1, S2 normal, no murmur, click, rub or gallop Abdomen: soft, non-tender;  bowel sounds normal; no masses,  no organomegaly Extremities: extremities normal, atraumatic, no cyanosis or edema Pulses: 2+ and symmetric Skin: Skin color, texture, turgor normal. No rashes or lesions Neurologic: Grossly normal Psych: Mildly anxious  EKG: Sinus bradycardia, RBBB - personally reviewed  ASSESSMENT: 1. Unstable angina with recent PCI to the 1st Diagonal (90% stenosis -> 0%) with DES x 1 (06/2018) 2. Recent PCI with DES to the ostial circumflex with a 4 x 12 mm Synergy DES (04/2017) 3. Coronary artery disease status post CABG (LIMA to LAD-atretic, SVG to distal LAD) 4. Hypertension-controlled 5. Dyslipidemia-on statin 6. Diabetes type 2 - controlled 7. Tingling and coolness of the fingers, concerning for Raynaud's phenomena 8. Recent small bowel obstruction and status post exploratory laparotomy 9. RBBB 10. Lower extremity edema-secondary to neuropathy 11. Chronic back pain with peripheral neuropathy  PLAN:  1.   Mrs. Tamara Nunez has aggressive coronary disease despite what appears to be an adequate lipid profile, controlled hypertension and no real evidence of type 2 diabetes-although on her chart, her fasting blood glucose has been well below 100 for a long time on metformin.  I would like to repeat a lipid profile as well as LP(a), to assess for further risk factors.  We may want to consider alternative treatment strategies to lower her risk.  We will also repeat a CBC today to make sure she has not had any worsening anemia.  I will contact her with those results, otherwise plan follow-up with her in 6 months or sooner as necessary.  Chrystie Nose, MD, [facility], FACP  [place]  [facility]  Medical Director of the Advanced Lipid Disorders &  Cardiovascular Risk Reduction Clinic Attending Cardiologist  Direct Dial: [phone]  Fax: [phone]  Website:  [email or web address] Donna 07/05/2018, 1:08 PM

## 2018-07-06 ENCOUNTER — Telehealth: Payer: Self-pay | Admitting: Internal Medicine

## 2018-07-06 LAB — CBC
Hematocrit: 31.6 % — ABNORMAL LOW (ref 34.0–46.6)
Hemoglobin: 9.7 g/dL — ABNORMAL LOW (ref 11.1–15.9)
MCH: 24.4 pg — ABNORMAL LOW (ref 26.6–33.0)
MCHC: 30.7 g/dL — ABNORMAL LOW (ref 31.5–35.7)
MCV: 80 fL (ref 79–97)
Platelets: 404 10*3/uL (ref 150–450)
RBC: 3.97 x10E6/uL (ref 3.77–5.28)
RDW: 17.6 % — ABNORMAL HIGH (ref 11.7–15.4)
WBC: 5.8 10*3/uL (ref 3.4–10.8)

## 2018-07-06 LAB — LIPID PANEL
Chol/HDL Ratio: 1.6 ratio (ref 0.0–4.4)
Cholesterol, Total: 131 mg/dL (ref 100–199)
HDL: 83 mg/dL (ref >39)
LDL Calculated: 38 mg/dL (ref 0–99)
Triglycerides: 48 mg/dL (ref 0–149)
VLDL Cholesterol Cal: 10 mg/dL (ref 5–40)

## 2018-07-06 LAB — LIPOPROTEIN A (LPA): Lipoprotein (a): 99.4 nmol/L — ABNORMAL HIGH (ref <75.0)

## 2018-07-06 NOTE — Telephone Encounter (Signed)
Statins do not lower Lipoprotein A - she already has excellent LDL control on her current statin. We would have to use another medicine (PSCK9 inhibitor) for that. I can try to get approval from her insurance to start Repatha, but it may be difficult.  Dr. Rexene Edison

## 2018-07-06 NOTE — Telephone Encounter (Addendum)
Pt called and says she was thinking after receiving her lab results..she would like Dr. Rennis Golden to know that she is willing to change the dose of her simvastatin or change to any other med that he feels will give her better reults on her Lipoprotein.Marland Kitchen will forward her message to Dr. Josephina Shih for their review.

## 2018-07-06 NOTE — Telephone Encounter (Signed)
New Message    Pt is calling and would like for Dr Andrez Grime nurse to call her back    Please call

## 2018-07-07 ENCOUNTER — Other Ambulatory Visit: Payer: Self-pay | Admitting: Internal Medicine

## 2018-07-07 NOTE — Telephone Encounter (Signed)
Submitted pa waiting for an approval

## 2018-07-08 NOTE — Telephone Encounter (Signed)
Isosorbide refilled. 

## 2018-07-13 ENCOUNTER — Telehealth: Payer: Self-pay | Admitting: Internal Medicine

## 2018-07-13 NOTE — Telephone Encounter (Signed)
Hilty will need to write a letter he knew that this would probably not be approved based on labs

## 2018-07-13 NOTE — Telephone Encounter (Signed)
Fax received from [employer] that Repatha SureClick 140mg /mL is NOT approved. The medication can only be approved if there is a diagnosis of primary hyperlipidemia and there has been submission of medical records documenting one of the following LDL-C values while on maximally tolerated lipid-lowering therapy within the last 120 days: -- LDL-C greater than/equal to 100mg /dL with ASCVD OR -- LDL-C greater than/equal to 130mg /dL without ASCVD OR -- submission of medical records documenting one of the following LDL-C values while on maximally tolerated lipid lowering therapy within last 120 days: LDL-C between 70 & 99mg /dL with ASCVD or LDL-C between 100-129mg /dL without ASCVD in a patient who has been receiving at least 12 consecutive weeks of zetia therapy as adjunct to maximally tolerated statin therapy or patient has h/o contraindication or intolerance to zetia as documented in patient medical record

## 2018-07-13 NOTE — Telephone Encounter (Signed)
Faxed to [facility] cardiac rehab referral form [phone]

## 2018-07-26 ENCOUNTER — Telehealth: Payer: Self-pay | Admitting: Internal Medicine

## 2018-07-26 NOTE — Telephone Encounter (Signed)
New Message:   Pt said she received a letter from her insurance. They stated that they needs additional information about why she needs to take Repatha. She said she was going to fax a copy of the letter to Dr Rennis Golden.

## 2018-07-27 NOTE — Telephone Encounter (Signed)
Tamara Nunez - have you been working with this patient

## 2018-07-27 NOTE — Telephone Encounter (Signed)
Message has been received that she has been denied [id number]. MD is aware that appeals letter will need to be composed.

## 2018-08-26 ENCOUNTER — Other Ambulatory Visit: Payer: Self-pay | Admitting: Internal Medicine

## 2018-10-11 NOTE — Telephone Encounter (Signed)
Pixie Casino, MD  Fidel Levy, RN        This will not be approved for LP(a) - would hold off on appeals.   Dr. Lemmie Evens

## 2018-10-18 ENCOUNTER — Other Ambulatory Visit: Payer: Self-pay

## 2018-10-18 ENCOUNTER — Encounter: Payer: Self-pay | Admitting: "Endocrinology

## 2018-10-18 ENCOUNTER — Encounter (INDEPENDENT_AMBULATORY_CARE_PROVIDER_SITE_OTHER): Payer: Self-pay

## 2018-10-18 ENCOUNTER — Ambulatory Visit (INDEPENDENT_AMBULATORY_CARE_PROVIDER_SITE_OTHER): Payer: Commercial Managed Care - PPO | Admitting: "Endocrinology

## 2018-10-18 VITALS — BP 124/75 | HR 72 | Ht 64.0 in | Wt 166.0 lb

## 2018-10-18 DIAGNOSIS — E1159 Type 2 diabetes mellitus with other circulatory complications: Secondary | ICD-10-CM | POA: Diagnosis not present

## 2018-10-18 DIAGNOSIS — I200 Unstable angina: Secondary | ICD-10-CM

## 2018-10-18 NOTE — Patient Instructions (Signed)

## 2018-10-18 NOTE — Progress Notes (Signed)
Endocrinology Consult Note       10/18/2018, 9:50 AM   Subjective:    Patient ID: Tamara Nunez, female    DOB: 08-02-1955.  Tamara Nunez is being seen in consultation for management of currently uncontrolled symptomatic diabetes requested by  Theodoro KosLewis, [name], MD.   Past Medical History:  Diagnosis Date  . Arthritis    "in my back" (05/06/2017)  . Chronic back pain   . Coronary artery disease    a. s/p CABG in 2006 with LIMA-LAD, SVG-dLAD. b. cath in 2009 showing patent grafts. c. 09/2016: cath with occluded LAD but patent SVG-dLAD. LIMA was atretic. pRCA with 80% stenosis --> treated with DES  . Fibromyalgia   . GERD (gastroesophageal reflux disease)   . Hyperlipidemia   . Hypertension   . Type II diabetes mellitus (HCC)     Past Surgical History:  Procedure Laterality Date  . ABDOMINAL HERNIA REPAIR  2018  . BACK SURGERY    . CARDIAC CATHETERIZATION  01/13/2008  . CORONARY ANGIOPLASTY WITH STENT PLACEMENT  09/26/2016   Prox RCA lesion, 80 %stenosed. DES   . CORONARY ARTERY BYPASS GRAFT  12/23/2004   2 vessel - LIMA to LAD (atretic), SVG to distal LAD  . CORONARY STENT INTERVENTION N/A 09/26/2016   Procedure: Coronary Stent Intervention;  Surgeon: Marykay LexHarding, David W, MD;  [facility];  Service: Cardiovascular;  Laterality: N/A;  . CORONARY STENT INTERVENTION N/A 05/06/2017   Procedure: CORONARY STENT INTERVENTION;  Surgeon: Kathleene HazelMcAlhany, Christopher D, MD;  Location: [facility];  Service: Cardiovascular;  Laterality: N/A;  . CORONARY STENT INTERVENTION N/A 06/25/2018   Procedure: CORONARY STENT INTERVENTION;  Surgeon: SwazilandJordan, Peter M, MD;  [facility];  Service: Cardiovascular;  Laterality: N/A;  . DOPPLER ECHOCARDIOGRAPHY  04/12/2008   EF 60%  . GASTRIC BYPASS  2009  . HERNIA REPAIR    . LEFT HEART CATH AND CORONARY ANGIOGRAPHY N/A 05/06/2017   Procedure:  LEFT HEART CATH AND CORONARY ANGIOGRAPHY;  Surgeon: Kathleene HazelMcAlhany, Christopher D, MD;  Location: [facility];  Service: Cardiovascular;  Laterality: N/A;  . LEFT HEART CATH AND CORS/GRAFTS ANGIOGRAPHY N/A 09/26/2016   Procedure: Left Heart Cath and Cors/Grafts Angiography;  Surgeon: Marykay LexHarding, David W, MD;  [facility];  Service: Cardiovascular;  Laterality: N/A;  . LEFT HEART CATH AND CORS/GRAFTS ANGIOGRAPHY N/A 06/25/2018   Procedure: LEFT HEART CATH AND CORS/GRAFTS ANGIOGRAPHY;  Surgeon: SwazilandJordan, Peter M, MD;  [facility];  Service: Cardiovascular;  Laterality: N/A;  . NM MYOVIEW LTD  06/03/2012   EF 71%  . POSTERIOR FUSION LUMBAR SPINE  1990s  . TUBAL LIGATION    . VAGINAL HYSTERECTOMY     "partial"    Social History   Socioeconomic History  . Marital status: Married    Spouse name: Not on file  . Number of children: Not on file  . Years of education: Not on file  . Highest education level: Not on file  Occupational History  . Not on file  Social Needs  . Financial resource strain: Not  on file  . Food insecurity    Worry: Not on file    Inability: Not on file  . Transportation needs    Medical: Not on file    Non-medical: Not on file  Tobacco Use  . Smoking status: Never Smoker  . Smokeless tobacco: Never Used  Substance and Sexual Activity  . Alcohol use: No  . Drug use: No  . Sexual activity: Not on file  Lifestyle  . Physical activity    Days per week: Not on file    Minutes per session: Not on file  . Stress: Not on file  Relationships  . Social Musicianconnections    Talks on phone: Not on file    Gets together: Not on file    Attends religious service: Not on file    Active member of club or organization: Not on file    Attends meetings of clubs or organizations: Not on file    Relationship status: Not on file  Other Topics Concern  . Not on file  Social History Narrative  . Not on file    Family History  Problem Relation Age  of Onset  . Heart disease Mother   . Cancer - Lung Father     Outpatient Encounter Medications as of 10/18/2018  Medication Sig  . acetaminophen (TYLENOL) 500 MG tablet Take 1,000 mg by mouth every 6 (six) hours as needed for moderate pain or headache.  . albuterol (PROVENTIL HFA;VENTOLIN HFA) 108 (90 BASE) MCG/ACT inhaler Inhale 2 puffs into the lungs every 6 (six) hours as needed for wheezing.  Marland Kitchen. aspirin EC 81 MG tablet Take 81 mg by mouth daily.  . cetirizine (ZYRTEC) 10 MG tablet Take 10 mg by mouth as needed for allergies.   Marland Kitchen. clopidogrel (PLAVIX) 75 MG tablet Take 1 tablet (75 mg total) by mouth daily with breakfast.  . fluticasone (FLONASE) 50 MCG/ACT nasal spray Place 1 spray into both nostrils daily as needed for allergies.   . furosemide (LASIX) 40 MG tablet TAKE 1 TABLET BY MOUTH  DAILY AS NEEDED FOR FLUID  OR EDEMA.  Marland Kitchen. gabapentin (NEURONTIN) 600 MG tablet Take 600 mg by mouth 3 (three) times daily.   . isosorbide mononitrate (IMDUR) 60 MG 24 hr tablet TAKE 1 AND 1/2 TABLETS BY  MOUTH TWO TIMES DAILY  . lidocaine (LIDODERM) 5 % Place 2 patches onto the skin daily as needed (for pain). Remove & Discard patch within 12 hours or as directed by MD   . metFORMIN (GLUCOPHAGE) 500 MG tablet Take 0.5 tablets (250 mg total) by mouth 2 (two) times daily.  . metoprolol tartrate (LOPRESSOR) 25 MG tablet Take 25 mg by mouth 2 (two) times daily.  . nitroGLYCERIN (NITROLINGUAL) 0.4 MG/SPRAY spray Place 1 spray under the tongue every 5 (five) minutes as needed for chest pain.  . pantoprazole (PROTONIX) 40 MG tablet Take 40 mg by mouth daily as needed (acid reflux).   . polyethylene glycol powder (GLYCOLAX/MIRALAX) powder Take 17 g by mouth daily.   . ramipril (ALTACE) 5 MG capsule Take 5 mg by mouth at bedtime.   . ranolazine (RANEXA) 1000 MG SR tablet Take 1 tablet (1,000 mg total) by mouth 2 (two) times daily.  . simvastatin (ZOCOR) 20 MG tablet Take 20 mg by mouth at bedtime.  . vitamin B-12  (CYANOCOBALAMIN) 1000 MCG tablet Take 1,000 mcg by mouth daily.  . vitamin E 400 UNIT capsule Take 400 Units by mouth daily.   No facility-administered  encounter medications on file as of 10/18/2018.     ALLERGIES: Allergies  Allergen Reactions  . Codeine Hives and Shortness Of Breath       . Other Anaphylaxis    Almonds   . Ciprofloxacin Itching  . Valsartan Rash  . Adhesive [Tape] Itching    "clear tape" used after surgery."took all her skin off".  Paper take is ok  . Sulfa Antibiotics     nausea nausea    VACCINATION STATUS:  There is no immunization history on file for this patient.  Diabetes She presents for her initial diabetic visit. She has type 2 diabetes mellitus. Onset time: She was diagnosed at approximate age of 34 years. Her disease course has been stable. There are no hypoglycemic associated symptoms. Pertinent negatives for hypoglycemia include no confusion, headaches, pallor or seizures. Associated symptoms include fatigue. Pertinent negatives for diabetes include no chest pain, no polydipsia, no polyphagia and no polyuria. There are no hypoglycemic complications. Symptoms are stable. Diabetic complications include heart disease. Risk factors for coronary artery disease include dyslipidemia, diabetes mellitus, hypertension, sedentary lifestyle and post-menopausal. Her weight is fluctuating minimally. She is following a generally unhealthy diet. When asked about meal planning, she reported none. She has not had a previous visit with a dietitian. She never participates in exercise. (She did not bring any logs nor meter to visit.  Her most recent A1c was 5.8% from March 2020, highest A1c she had was 6.2% in the last 3 years.) An ACE inhibitor/angiotensin II receptor blocker is being taken. Eye exam is current.  Hyperlipidemia This is a chronic problem. The current episode started more than 1 year ago. The problem is controlled. Exacerbating diseases include diabetes.  Pertinent negatives include no chest pain, myalgias or shortness of breath. Risk factors for coronary artery disease include dyslipidemia, diabetes mellitus, hypertension, a sedentary lifestyle and post-menopausal.  Hypertension This is a chronic problem. The problem is controlled. Pertinent negatives include no chest pain, headaches, palpitations or shortness of breath. Risk factors for coronary artery disease include dyslipidemia, diabetes mellitus, sedentary lifestyle, family history and post-menopausal state. Past treatments include ACE inhibitors. Hypertensive end-organ damage includes CAD/MI.     Review of Systems  Constitutional: Positive for fatigue. Negative for chills, fever and unexpected weight change.  HENT: Negative for trouble swallowing and voice change.   Eyes: Negative for visual disturbance.  Respiratory: Negative for cough, shortness of breath and wheezing.   Cardiovascular: Negative for chest pain, palpitations and leg swelling.  Gastrointestinal: Negative for diarrhea, nausea and vomiting.  Endocrine: Negative for cold intolerance, heat intolerance, polydipsia, polyphagia and polyuria.  Musculoskeletal: Negative for arthralgias and myalgias.  Skin: Negative for color change, pallor, rash and wound.  Neurological: Negative for seizures and headaches.  Psychiatric/Behavioral: Negative for confusion and suicidal ideas.    Objective:    BP 124/75   Pulse 72   Ht 5\' 4"  (1.626 m)   Wt 166 lb (75.3 kg)   BMI 28.49 kg/m   Wt Readings from Last 3 Encounters:  10/18/18 166 lb (75.3 kg)  07/05/18 165 lb 3.2 oz (74.9 kg)  06/26/18 168 lb 6.4 oz (76.4 kg)     Physical Exam Constitutional:      Appearance: She is well-developed.  HENT:     Head: Normocephalic and atraumatic.  Neck:     Musculoskeletal: Normal range of motion and neck supple.     Thyroid: No thyromegaly.     Trachea: No tracheal deviation.  Cardiovascular:  Rate and Rhythm: Normal rate.   Pulmonary:     Effort: Pulmonary effort is normal.  Abdominal:     Tenderness: There is no abdominal tenderness. There is no guarding.  Musculoskeletal: Normal range of motion.  Skin:    General: Skin is warm and dry.     Coloration: Skin is not pale.     Findings: No erythema or rash.  Neurological:     Mental Status: She is alert and oriented to person, place, and time.     Cranial Nerves: No cranial nerve deficit.     Coordination: Coordination normal.     Deep Tendon Reflexes: Reflexes are normal and symmetric.  Psychiatric:        Judgment: Judgment normal.     Check  CMP ( most recent) CMP     Component Value Date/Time   NA 142 06/26/2018 0401   NA 141 06/22/2018 1119   K 4.7 06/26/2018 0401   CL 108 06/26/2018 0401   CO2 25 06/26/2018 0401   GLUCOSE 95 06/26/2018 0401   BUN 14 06/26/2018 0401   BUN 16 06/22/2018 1119   CREATININE 0.91 06/26/2018 0401   CALCIUM 8.7 (L) 06/26/2018 0401   PROT 7.1 01/01/2018 0900   ALBUMIN 4.2 01/01/2018 0900   AST 19 01/01/2018 0900   ALT 17 01/01/2018 0900   ALKPHOS 92 01/01/2018 0900   BILITOT 0.3 01/01/2018 0900   GFRNONAA >60 06/26/2018 0401   GFRAA >60 06/26/2018 0401     Diabetic Labs (most recent): Lab Results  Component Value Date   HGBA1C  04/14/2010    5.3 (NOTE)                                                                       According to the ADA Clinical Practice Recommendations for 2011, when HbA1c is used as a screening test:   >=6.5%   Diagnostic of Diabetes Mellitus           (if abnormal result  is confirmed)  5.7-6.4%   Increased risk of developing Diabetes Mellitus  References:Diagnosis and Classification of Diabetes Mellitus,Diabetes XLKG,4010,27(OZDGU 1):S62-S69 and Standards of Medical Care in         Diabetes - 2011,Diabetes Care,2011,34  (Suppl 1):S11-S61.     Lipid Panel ( most recent) Lipid Panel     Component Value Date/Time   CHOL 131 07/05/2018 1152   TRIG 48 07/05/2018 1152   HDL  83 07/05/2018 1152   CHOLHDL 1.6 07/05/2018 1152   CHOLHDL 1.8 04/15/2010 0400   VLDL 3 04/15/2010 0400   LDLCALC 38 07/05/2018 1152      Lab Results  Component Value Date   TSH 1.540 01/01/2018   TSH 1.268 04/14/2010       Assessment & Plan:   1. DM type 2 causing vascular disease ([place])   - RAYNEE MCCASLAND has currently controlled  type 2 DM since  63 years of age,  with most recent A1c of 5.8 %. Recent labs reviewed. - I had a long discussion with her about the progressive nature of diabetes and the pathology behind its complications. -her diabetes is complicated by coronary artery disease, sedentary life, and she remains at a high risk for more  acute and chronic complications which include CAD, CVA, CKD, retinopathy, and neuropathy. These are all discussed in detail with her.  - I have counseled her on diet management and weight loss, by adopting a carbohydrate restricted/protein rich diet. - she admits that there is a room for improvement in her food and drink choices. - Suggestion is made for her to avoid simple carbohydrates  from her diet including Cakes, Sweet Desserts, Ice Cream, Soda (diet and regular), Sweet Tea, Candies, Chips, Cookies, Store Bought Juices, Alcohol in Excess of  1-2 drinks a day, Artificial Sweeteners,  Coffee Creamer, and "Sugar-free" Products. This will help patient to have more stable blood glucose profile and potentially avoid unintended weight gain.  - I encouraged her to switch to  unprocessed or minimally processed complex starch and increased protein intake (animal or plant source), fruits, and vegetables.  - she is advised to stick to a routine mealtimes to eat 3 meals  a day and avoid unnecessary snacks ( to snack only to correct hypoglycemia).   - she will be scheduled with Norm Salt, RDN, CDE for diabetes education.  - I have approached her with the following individualized plan to manage  her diabetes and patient agrees:   - she  reports some symptoms out of proportion to the degree of cardiac, probably not related to her diabetes.  She is approached to start strict monitoring of glucose 4 times a day-before meals and at bedtime, and return for a visit in 2 weeks with her meter and logs.  - she is encouraged to call clinic for blood glucose levels less than 70 or above 300 mg /dl. - she is advised to continue metformin 250 mg p.o. twice daily, therapeutically suitable for patient .  She is advised to take her metformin after breakfast and after supper. -She will be considered for additional treatment as appropriate on her next visit.  - Patient specific target  A1c;  LDL, HDL, Triglycerides, and  Waist Circumference were discussed in detail.  2) Blood Pressure /Hypertension:  her blood pressure is  controlled to target.   she is advised to continue her current medications including  Ramipril 5 mg p.o. daily with breakfast . 3) Lipids/Hyperlipidemia:   Review of her recent lipid panel showed  controlled  LDL at 38 .  she  is advised to continue    simvastatin 20 mg daily at bedtime.  Side effects and precautions discussed with her.  4)  Weight/Diet:  Body mass index is 28.49 kg/m.  - I discussed with her the fact that loss of 5 - 10% of her  current body weight will have the most impact on her diabetes management.  CDE Consult will be initiated . Exercise, and detailed carbohydrates information provided  -  detailed on discharge instructions.  5) Chronic Care/Health Maintenance:  -she  is on ACEI/ARB and Statin medications and  is encouraged to initiate and continue to follow up with Ophthalmology, Dentist,  Podiatrist at least yearly or according to recommendations, and advised to   stay away from smoking. I have recommended yearly flu vaccine and pneumonia vaccine at least every 5 years; moderate intensity exercise for up to 150 minutes weekly; and  sleep for at least 7 hours a day.  - she is  advised to maintain close  follow up with Theodoro Kos, MD for primary care needs, as well as her other providers for optimal and coordinated care.  - Time spent with the patient: 34  minutes, of which >50% was spent in obtaining information about her symptoms, reviewing her previous labs/studies, evaluations, and treatments, counseling her about her currently controlled type 2 diabetes, hyperlipidemia, hypertension, and developing plans for long term treatment based on the latest standards of care/guidelines.  Please refer to " Patient Self Inventory" in the Media  tab for reviewed elements of pertinent patient history.  Tamara Nunez participated in the discussions, expressed understanding, and voiced agreement with the above plans.  All questions were answered to her satisfaction. she is encouraged to contact clinic should she have any questions or concerns prior to her return visit.  Follow up plan: - Return in about 10 days (around 10/28/2018) for Follow up with Pre-visit Labs, Meter, and Logs, Labs Today- Non-Fasting Ok.  Marquis LunchGebre Henrietta Cieslewicz, MD [facility] [address] [place], [place] [address] Phone: [phone]  Fax: [phone]    10/18/2018, 9:50 AM  This note was partially dictated with voice recognition software. Similar sounding words can be transcribed inadequately or may not  be corrected upon review.

## 2018-10-19 LAB — COMPLETE METABOLIC PANEL WITH GFR
AG Ratio: 1.4 (calc) (ref 1.0–2.5)
ALT: 13 U/L (ref 6–29)
AST: 20 U/L (ref 10–35)
Albumin: 3.8 g/dL (ref 3.6–5.1)
Alkaline phosphatase (APISO): 78 U/L (ref 37–153)
BUN: 20 mg/dL (ref 7–25)
CO2: 29 mmol/L (ref 20–32)
Calcium: 9.2 mg/dL (ref 8.6–10.4)
Chloride: 109 mmol/L (ref 98–110)
Creat: 0.94 mg/dL (ref 0.50–0.99)
GFR, Est African American: 74 mL/min/{1.73_m2} (ref >=60)
GFR, Est Non African American: 64 mL/min/{1.73_m2} (ref >=60)
Globulin: 2.7 g/dL (calc) (ref 1.9–3.7)
Glucose, Bld: 85 mg/dL (ref 65–99)
Potassium: 4.7 mmol/L (ref 3.5–5.3)
Sodium: 144 mmol/L (ref 135–146)
Total Bilirubin: 0.4 mg/dL (ref 0.2–1.2)
Total Protein: 6.5 g/dL (ref 6.1–8.1)

## 2018-10-19 LAB — VITAMIN D 25 HYDROXY (VIT D DEFICIENCY, FRACTURES): Vit D, 25-Hydroxy: 17 ng/mL — ABNORMAL LOW (ref 30–100)

## 2018-10-19 LAB — HEMOGLOBIN A1C
Hgb A1c MFr Bld: 5.9 % of total Hgb — ABNORMAL HIGH (ref <5.7)
Mean Plasma Glucose: 123 (calc)
eAG (mmol/L): 6.8 (calc)

## 2018-10-31 DIAGNOSIS — M79621 Pain in right upper arm: Secondary | ICD-10-CM | POA: Diagnosis not present

## 2018-11-01 ENCOUNTER — Encounter: Payer: Self-pay | Admitting: "Endocrinology

## 2018-11-01 ENCOUNTER — Ambulatory Visit (INDEPENDENT_AMBULATORY_CARE_PROVIDER_SITE_OTHER): Payer: Commercial Managed Care - PPO | Admitting: "Endocrinology

## 2018-11-01 ENCOUNTER — Other Ambulatory Visit: Payer: Self-pay

## 2018-11-01 DIAGNOSIS — E782 Mixed hyperlipidemia: Secondary | ICD-10-CM

## 2018-11-01 DIAGNOSIS — E1159 Type 2 diabetes mellitus with other circulatory complications: Secondary | ICD-10-CM

## 2018-11-01 DIAGNOSIS — I200 Unstable angina: Secondary | ICD-10-CM

## 2018-11-01 DIAGNOSIS — I10 Essential (primary) hypertension: Secondary | ICD-10-CM

## 2018-11-01 DIAGNOSIS — E559 Vitamin D deficiency, unspecified: Secondary | ICD-10-CM | POA: Diagnosis not present

## 2018-11-01 MED ORDER — VITAMIN D (ERGOCALCIFEROL) 1.25 MG (50000 UNIT) PO CAPS: 50000.0000 [IU] | ORAL_CAPSULE | ORAL | 0 refills | Status: DC

## 2018-11-01 MED ORDER — VITAMIN D3 125 MCG (5000 UT) PO CAPS: 5000.0000 [IU] | ORAL_CAPSULE | Freq: Every day | ORAL | 1 refills | Status: DC

## 2018-11-01 NOTE — Progress Notes (Signed)
11/01/2018, 4:57 PM                                                    Endocrinology Telehealth Visit Follow up Note -During COVID -19 Pandemic  This visit type was conducted due to national recommendations for restrictions regarding the COVID-19 Pandemic  in an effort to limit this patient's exposure and mitigate transmission of the corona virus.  Due to her co-morbid illnesses, Tamara Nunez is at  moderate to high risk for complications without adequate follow up.  This format is felt to be most appropriate for her at this time.  I connected with this patient on 11/01/2018   by telephone and verified that I am speaking with the correct person using two identifiers. Tamara Nunez, 1955/06/25. she has verbally consented to this visit. All issues noted in this document were discussed and addressed. The format was not optimal for physical exam.    Subjective:    Patient ID: Tamara Nunez, female    DOB: 1955/06/25.  Tamara Nunez is being engaged in telehealth via telephone follow-up  for management of currently controlled symptomatic diabetes requested by  Theodoro KosLewis, [name], MD.   Past Medical History:  Diagnosis Date  . Arthritis    "in my back" (05/06/2017)  . Chronic back pain   . Coronary artery disease    a. s/p CABG in 2006 with LIMA-LAD, SVG-dLAD. b. cath in 2009 showing patent grafts. c. 09/2016: cath with occluded LAD but patent SVG-dLAD. LIMA was atretic. pRCA with 80% stenosis --> treated with DES  . Fibromyalgia   . GERD (gastroesophageal reflux disease)   . Hyperlipidemia   . Hypertension   . Type II diabetes mellitus (HCC)     Past Surgical History:  Procedure Laterality Date  . ABDOMINAL HERNIA REPAIR  2018  . BACK SURGERY    . CARDIAC CATHETERIZATION  01/13/2008  . CORONARY ANGIOPLASTY WITH STENT PLACEMENT  09/26/2016   Prox RCA lesion, 80 %stenosed. DES   . CORONARY ARTERY  BYPASS GRAFT  12/23/2004   2 vessel - LIMA to LAD (atretic), SVG to distal LAD  . CORONARY STENT INTERVENTION N/A 09/26/2016   Procedure: Coronary Stent Intervention;  Surgeon: Marykay LexHarding, David W, MD;  [facility];  Service: Cardiovascular;  Laterality: N/A;  . CORONARY STENT INTERVENTION N/A 05/06/2017   Procedure: CORONARY STENT INTERVENTION;  Surgeon: Kathleene HazelMcAlhany, Christopher D, MD;  Location: [facility] INVASIVE CV LAB;  Service: Cardiovascular;  Laterality: N/A;  . CORONARY STENT INTERVENTION N/A 06/25/2018   Procedure: CORONARY STENT INTERVENTION;  Surgeon: SwazilandJordan, Peter M, MD;  [facility];  Service: Cardiovascular;  Laterality: N/A;  . DOPPLER ECHOCARDIOGRAPHY  04/12/2008   EF 60%  . GASTRIC BYPASS  2009  . HERNIA REPAIR    . LEFT HEART CATH AND CORONARY ANGIOGRAPHY N/A 05/06/2017   Procedure: LEFT HEART CATH AND CORONARY ANGIOGRAPHY;  Surgeon: Verne CarrowMcAlhany, Christopher  D, MD;  Location: [facility] INVASIVE CV LAB;  Service: Cardiovascular;  Laterality: N/A;  . LEFT HEART CATH AND CORS/GRAFTS ANGIOGRAPHY N/A 09/26/2016   Procedure: Left Heart Cath and Cors/Grafts Angiography;  Surgeon: Marykay Lex, MD;  Location: [facility] INVASIVE CV LAB;  Service: Cardiovascular;  Laterality: N/A;  . LEFT HEART CATH AND CORS/GRAFTS ANGIOGRAPHY N/A 06/25/2018   Procedure: LEFT HEART CATH AND CORS/GRAFTS ANGIOGRAPHY;  Surgeon: Swaziland, Peter M, MD;  Location: [facility] INVASIVE CV LAB;  Service: Cardiovascular;  Laterality: N/A;  . NM MYOVIEW LTD  06/03/2012   EF 71%  . POSTERIOR FUSION LUMBAR SPINE  1990s  . TUBAL LIGATION    . VAGINAL HYSTERECTOMY     "partial"    Social History   Socioeconomic History  . Marital status: Married    Spouse name: Not on file  . Number of children: Not on file  . Years of education: Not on file  . Highest education level: Not on file  Occupational History  . Not on file  Social Needs  . Financial resource strain: Not on file  . Food insecurity    Worry: Not on  file    Inability: Not on file  . Transportation needs    Medical: Not on file    Non-medical: Not on file  Tobacco Use  . Smoking status: Never Smoker  . Smokeless tobacco: Never Used  Substance and Sexual Activity  . Alcohol use: No  . Drug use: No  . Sexual activity: Not on file  Lifestyle  . Physical activity    Days per week: Not on file    Minutes per session: Not on file  . Stress: Not on file  Relationships  . Social Musician on phone: Not on file    Gets together: Not on file    Attends religious service: Not on file    Active member of club or organization: Not on file    Attends meetings of clubs or organizations: Not on file    Relationship status: Not on file  Other Topics Concern  . Not on file  Social History Narrative  . Not on file    Family History  Problem Relation Age of Onset  . Heart disease Mother   . Cancer - Lung Father     Outpatient Encounter Medications as of 11/01/2018  Medication Sig  . acetaminophen (TYLENOL) 500 MG tablet Take 1,000 mg by mouth every 6 (six) hours as needed for moderate pain or headache.  . albuterol (PROVENTIL HFA;VENTOLIN HFA) 108 (90 BASE) MCG/ACT inhaler Inhale 2 puffs into the lungs every 6 (six) hours as needed for wheezing.  Marland Kitchen aspirin EC 81 MG tablet Take 81 mg by mouth daily.  . cetirizine (ZYRTEC) 10 MG tablet Take 10 mg by mouth as needed for allergies.   . Cholecalciferol (VITAMIN D3) 125 MCG (5000 UT) CAPS Take 1 capsule (5,000 Units total) by mouth daily.  . clopidogrel (PLAVIX) 75 MG tablet Take 1 tablet (75 mg total) by mouth daily with breakfast.  . fluticasone (FLONASE) 50 MCG/ACT nasal spray Place 1 spray into both nostrils daily as needed for allergies.   . furosemide (LASIX) 40 MG tablet TAKE 1 TABLET BY MOUTH  DAILY AS NEEDED FOR FLUID  OR EDEMA.  Marland Kitchen gabapentin (NEURONTIN) 600 MG tablet Take 600 mg by mouth 3 (three) times daily.   . isosorbide mononitrate (IMDUR) 60 MG 24 hr tablet TAKE 1  AND 1/2 TABLETS BY  MOUTH TWO TIMES DAILY  . lidocaine (LIDODERM) 5 % Place 2 patches onto the skin daily as needed (for pain). Remove & Discard patch within 12 hours or as directed by MD   . metoprolol tartrate (LOPRESSOR) 25 MG tablet Take 25 mg by mouth 2 (two) times daily.  . nitroGLYCERIN (NITROLINGUAL) 0.4 MG/SPRAY spray Place 1 spray under the tongue every 5 (five) minutes as needed for chest pain.  . pantoprazole (PROTONIX) 40 MG tablet Take 40 mg by mouth daily as needed (acid reflux).   . polyethylene glycol powder (GLYCOLAX/MIRALAX) powder Take 17 g by mouth daily.   . ramipril (ALTACE) 5 MG capsule Take 5 mg by mouth at bedtime.   . ranolazine (RANEXA) 1000 MG SR tablet Take 1 tablet (1,000 mg total) by mouth 2 (two) times daily.  . simvastatin (ZOCOR) 20 MG tablet Take 20 mg by mouth at bedtime.  . vitamin B-12 (CYANOCOBALAMIN) 1000 MCG tablet Take 1,000 mcg by mouth daily.  . Vitamin D, Ergocalciferol, (DRISDOL) 1.25 MG (50000 UT) CAPS capsule Take 1 capsule (50,000 Units total) by mouth every 7 (seven) days.  . vitamin E 400 UNIT capsule Take 400 Units by mouth daily.  . [DISCONTINUED] metFORMIN (GLUCOPHAGE) 500 MG tablet Take 0.5 tablets (250 mg total) by mouth 2 (two) times daily.   No facility-administered encounter medications on file as of 11/01/2018.     ALLERGIES: Allergies  Allergen Reactions  . Codeine Hives and Shortness Of Breath       . Other Anaphylaxis    Almonds   . Ciprofloxacin Itching  . Valsartan Rash  . Adhesive [Tape] Itching    "clear tape" used after surgery."took all her skin off".  Paper take is ok  . Sulfa Antibiotics     nausea nausea    VACCINATION STATUS:  There is no immunization history on file for this patient.  Diabetes She presents for her follow-up diabetic visit. She has type 2 diabetes mellitus. Onset time: She was diagnosed at approximate age of 34 years. Her disease course has been improving. There are no hypoglycemic  associated symptoms. Pertinent negatives for hypoglycemia include no confusion, headaches, pallor or seizures. Associated symptoms include fatigue. Pertinent negatives for diabetes include no chest pain, no polydipsia, no polyphagia and no polyuria. There are no hypoglycemic complications. Symptoms are stable. Diabetic complications include heart disease. Risk factors for coronary artery disease include dyslipidemia, diabetes mellitus, hypertension, sedentary lifestyle and post-menopausal. Her weight is fluctuating minimally. She is following a generally unhealthy diet. When asked about meal planning, she reported none. She has not had a previous visit with a dietitian. She never participates in exercise. Her breakfast blood glucose range is generally 110-130 mg/dl. Her overall blood glucose range is 110-130 mg/dl. An ACE inhibitor/angiotensin II receptor blocker is being taken. Eye exam is current.  Hyperlipidemia This is a chronic problem. The current episode started more than 1 year ago. The problem is controlled. Exacerbating diseases include diabetes. Pertinent negatives include no chest pain, myalgias or shortness of breath. Risk factors for coronary artery disease include dyslipidemia, diabetes mellitus, hypertension, a sedentary lifestyle and post-menopausal.  Hypertension This is a chronic problem. The problem is controlled. Pertinent negatives include no chest pain, headaches, palpitations or shortness of breath. Risk factors for coronary artery disease include dyslipidemia, diabetes mellitus, sedentary lifestyle, family history and post-menopausal state. Past treatments include ACE inhibitors. Hypertensive end-organ damage includes CAD/MI.     Review of Systems  Constitutional: Positive for fatigue. Negative for  chills, fever and unexpected weight change.  HENT: Negative for trouble swallowing and voice change.   Eyes: Negative for visual disturbance.  Respiratory: Negative for cough,  shortness of breath and wheezing.   Cardiovascular: Negative for chest pain, palpitations and leg swelling.  Gastrointestinal: Negative for diarrhea, nausea and vomiting.  Endocrine: Negative for cold intolerance, heat intolerance, polydipsia, polyphagia and polyuria.  Musculoskeletal: Negative for arthralgias and myalgias.  Skin: Negative for color change, pallor, rash and wound.  Neurological: Negative for seizures and headaches.  Psychiatric/Behavioral: Negative for confusion and suicidal ideas.    Objective:    There were no vitals taken for this visit.  Wt Readings from Last 3 Encounters:  10/18/18 166 lb (75.3 kg)  07/05/18 165 lb 3.2 oz (74.9 kg)  06/26/18 168 lb 6.4 oz (76.4 kg)       CMP     Component Value Date/Time   NA 144 10/18/2018 1000   NA 141 06/22/2018 1119   K 4.7 10/18/2018 1000   CL 109 10/18/2018 1000   CO2 29 10/18/2018 1000   GLUCOSE 85 10/18/2018 1000   BUN 20 10/18/2018 1000   BUN 16 06/22/2018 1119   CREATININE 0.94 10/18/2018 1000   CALCIUM 9.2 10/18/2018 1000   PROT 6.5 10/18/2018 1000   PROT 7.1 01/01/2018 0900   ALBUMIN 4.2 01/01/2018 0900   AST 20 10/18/2018 1000   ALT 13 10/18/2018 1000   ALKPHOS 92 01/01/2018 0900   BILITOT 0.4 10/18/2018 1000   BILITOT 0.3 01/01/2018 0900   GFRNONAA 64 10/18/2018 1000   GFRAA 74 10/18/2018 1000     Diabetic Labs (most recent): Lab Results  Component Value Date   HGBA1C 5.9 (H) 10/18/2018   HGBA1C 5.8 05/12/2018   HGBA1C  04/14/2010    5.3 (NOTE)                                                                       According to the ADA Clinical Practice Recommendations for 2011, when HbA1c is used as a screening test:   >=6.5%   Diagnostic of Diabetes Mellitus           (if abnormal result  is confirmed)  5.7-6.4%   Increased risk of developing Diabetes Mellitus  References:Diagnosis and Classification of Diabetes Mellitus,Diabetes Care,2011,34(Suppl 1):S62-S69 and Standards of Medical Care in          Diabetes - 2011,Diabetes Care,2011,34  (Suppl 1):S11-S61.     Lipid Panel ( most recent) Lipid Panel     Component Value Date/Time   CHOL 131 07/05/2018 1152   TRIG 48 07/05/2018 1152   HDL 83 07/05/2018 1152   CHOLHDL 1.6 07/05/2018 1152   CHOLHDL 1.8 04/15/2010 0400   VLDL 3 04/15/2010 0400   LDLCALC 38 07/05/2018 1152      Lab Results  Component Value Date   TSH 1.47 05/12/2018   TSH 1.540 01/01/2018   TSH 1.268 04/14/2010       Assessment & Plan:   1. DM type 2 causing vascular disease (HCC)   - Tamara Nunez has currently controlled  type 2 DM since  63 years of age,  with most recent A1c of 5.9 %. Recent labs reviewed with her.  -  her diabetes is complicated by coronary artery disease, sedentary life, and she remains at a high risk for more acute and chronic complications which include CAD, CVA, CKD, retinopathy, and neuropathy. These are all discussed in detail with her.  - I have counseled her on diet management and weight loss, by adopting a carbohydrate restricted/protein rich diet.  - she  admits there is a room for improvement in her diet and drink choices. -  Suggestion is made for her to avoid simple carbohydrates  from her diet including Cakes, Sweet Desserts / Pastries, Ice Cream, Soda (diet and regular), Sweet Tea, Candies, Chips, Cookies, Sweet Pastries,  Store Bought Juices, Alcohol in Excess of  1-2 drinks a day, Artificial Sweeteners, Coffee Creamer, and "Sugar-free" Products. This will help patient to have stable blood glucose profile and potentially avoid unintended weight gain.  - I encouraged her to switch to  unprocessed or minimally processed complex starch and increased protein intake (animal or plant source), fruits, and vegetables.  - she is advised to stick to a routine mealtimes to eat 3 meals  a day and avoid unnecessary snacks ( to snack only to correct hypoglycemia).  - I have approached her with the following individualized plan  to manage  her diabetes and patient agrees:   -Given her report of tightening glycemic profile in  to 60s randomly, she is advised to discontinue metformin treatment at this time.  Her previsit labs show A1c of 5.9%.  She is advised to monitor blood glucose only during symptoms suggestive of hypoglycemia.  -She will be considered for treatment as appropriate on her next visit.  - Patient specific target  A1c;  LDL, HDL, Triglycerides, and  Waist Circumference were discussed in detail.  2) Blood Pressure /Hypertension:  her blood pressure is  controlled to target.   she is advised to continue her current medications including  Ramipril 5 mg p.o. daily with breakfast . 3) Lipids/Hyperlipidemia:   Review of her recent lipid panel showed  controlled  LDL at 38 .  she  is advised to continue    simvastatin 20 mg daily at bedtime.  Side effects and precautions discussed with her.  4)  Weight/Diet: Her BMI is 28.- I discussed with her the fact that loss of 5 - 10% of her  current body weight will have the most impact on her diabetes management.  CDE Consult will be initiated . Exercise, and detailed carbohydrates information provided  -  detailed on discharge instructions.  5) vitamin D deficiency: I discussed initiated vitamin D3 5000 units daily x90 days.  6) Chronic Care/Health Maintenance:  -she  is on ACEI/ARB and Statin medications and  is encouraged to initiate and continue to follow up with Ophthalmology, Dentist,  Podiatrist at least yearly or according to recommendations, and advised to   stay away from smoking. I have recommended yearly flu vaccine and pneumonia vaccine at least every 5 years; moderate intensity exercise for up to 150 minutes weekly; and  sleep for at least 7 hours a day.  - she is  advised to maintain close follow up with Galen Manila, MD for primary care needs, as well as her other providers for optimal and coordinated care.  - Patient Care Time Today:  25 min, of which  >50% was spent in  counseling and the rest reviewing her  current and  previous labs/studies, previous treatments, her blood glucose readings, and medications' doses and developing a plan for long-term care based  on the latest recommendations for standards of care.   Ilze O Grounds participated in the discussions, expressed understanding, and voiced agreement with the above plans.  All questions werDerwood Kaplane answered to her satisfaction. she is encouraged to contact clinic should she have any questions or concerns prior to her return visit.   Follow up plan: - Return in about 3 months (around 02/01/2019) for Bring Meter and Logs- A1c in Office.  Marquis LunchGebre Zamari Vea, MD [facility] [address] [place], [place] [address] Phone: [phone]  Fax: [phone]    11/01/2018, 4:57 PM  This note was partially dictated with voice recognition software. Similar sounding words can be transcribed inadequately or may not  be corrected upon review.

## 2018-11-24 DIAGNOSIS — M5126 Other intervertebral disc displacement, lumbar region: Secondary | ICD-10-CM | POA: Diagnosis not present

## 2018-11-24 DIAGNOSIS — M47816 Spondylosis without myelopathy or radiculopathy, lumbar region: Secondary | ICD-10-CM | POA: Diagnosis not present

## 2018-11-24 DIAGNOSIS — M48062 Spinal stenosis, lumbar region with neurogenic claudication: Secondary | ICD-10-CM | POA: Diagnosis not present

## 2018-11-24 DIAGNOSIS — Z6828 Body mass index (BMI) 28.0-28.9, adult: Secondary | ICD-10-CM | POA: Diagnosis not present

## 2018-11-24 DIAGNOSIS — M48061 Spinal stenosis, lumbar region without neurogenic claudication: Secondary | ICD-10-CM | POA: Diagnosis not present

## 2018-11-24 DIAGNOSIS — S32009K Unspecified fracture of unspecified lumbar vertebra, subsequent encounter for fracture with nonunion: Secondary | ICD-10-CM | POA: Diagnosis not present

## 2018-11-24 DIAGNOSIS — M545 Low back pain: Secondary | ICD-10-CM | POA: Diagnosis not present

## 2018-11-24 DIAGNOSIS — M5416 Radiculopathy, lumbar region: Secondary | ICD-10-CM | POA: Diagnosis not present

## 2018-11-25 ENCOUNTER — Encounter: Payer: Self-pay | Admitting: *Deleted

## 2018-11-25 ENCOUNTER — Telehealth: Payer: Self-pay | Admitting: *Deleted

## 2018-11-25 NOTE — Telephone Encounter (Signed)
   [facility] Pre-operative Risk Assessment    Request for surgical clearance:  1. What type of surgery is being performed?  L2-3 REDO DECOMPRESSION WITH FUSION  2. When is this surgery scheduled?  TBD  3. What type of clearance is required (medical clearance vs. Pharmacy clearance to hold med vs. Both)? BOTH  4. Are there any medications that need to be held prior to surgery and how long? ASA PLAVIX  5. Practice name and name of physician performing surgery? [facility] DR Broadus John STERN  6. What is your office phone number [phone]   7.   What is your office fax number [phone]  8.   Anesthesia type (None, local, MAC, general) ? GENERAL   Devra Dopp 11/25/2018, 11:58 AM  _________________________________________________________________   (provider comments below)

## 2018-11-25 NOTE — Telephone Encounter (Signed)
   Primary Cardiologist: Pixie Casino, MD  Chart reviewed as part of pre-operative protocol coverage. Patient was contacted 11/25/2018 in reference to pre-operative risk assessment for pending surgery as outlined below.  Tamara Nunez was last seen on 07/05/2018 by Dr. Debara Pickett.  Since that day, Tamara Nunez has done well with no anginal symptoms. She has an extensive cardiac history with CABG in 2006 and 3 subsequent stent procedures with the last one on 06/25/18 with recommendation for DAPT for at least 12 months.   The patient says that she is having significant back pain and the procedure is planned for late October or early November. She is able walk, although not much due to pain.  I will route this request to Dr. Debara Pickett to see if OK to have surgery and hold aspirin and Plavix prior to her 12 months post stent in April.     Daune Perch, NP 11/25/2018, 1:55 PM

## 2018-11-29 NOTE — Telephone Encounter (Signed)
   Primary Cardiologist: Pixie Casino, MD  Chart reviewed as part of pre-operative protocol coverage. Patient was contacted 11/29/2018 in reference to pre-operative risk assessment for pending surgery as outlined below.  CHANIE SOUCEK was last seen on 07/05/2018 by Dr. Debara Pickett.  Since that day, ADDI PAK has done well.  Therefore, based on ACC/AHA guidelines, the patient would be at acceptable risk for the planned procedure without further cardiovascular testing.   I will route this recommendation to the requesting party via Epic fax function and remove from pre-op pool.  Please call with questions.  Per Dr Debara Pickett: "Ok to hold aspirin for 7 days and Plavix for 5 days prior to surgery and restart as soon as possible after - the surgery should be >6 months post stent, which would be after 12/25/18". Since surgery require holding aspirin for 7 days and plavix for 5 days, the earliest surgery date would be 10/25.  [place], [place] 11/29/2018, 1:53 PM

## 2018-11-29 NOTE — Telephone Encounter (Signed)
Pt informed of providers result & recommendations. Pt verbalized understanding. No further questions . She will call [facility] to schedule surgery and will keep the 01-02-2019 date in mind when scheduling. She will CB if anything else is needed

## 2018-12-01 ENCOUNTER — Other Ambulatory Visit: Payer: Self-pay | Admitting: Neurosurgery

## 2018-12-27 ENCOUNTER — Other Ambulatory Visit: Payer: Self-pay

## 2018-12-27 ENCOUNTER — Ambulatory Visit: Payer: Commercial Managed Care - PPO | Admitting: Internal Medicine

## 2018-12-27 ENCOUNTER — Encounter: Payer: Self-pay | Admitting: Physician Assistant

## 2018-12-27 ENCOUNTER — Ambulatory Visit (INDEPENDENT_AMBULATORY_CARE_PROVIDER_SITE_OTHER): Payer: Commercial Managed Care - PPO | Admitting: Physician Assistant

## 2018-12-27 VITALS — BP 134/80 | HR 56 | Temp 95.9°F | Ht 64.0 in | Wt 172.0 lb

## 2018-12-27 DIAGNOSIS — I200 Unstable angina: Secondary | ICD-10-CM

## 2018-12-27 DIAGNOSIS — E785 Hyperlipidemia, unspecified: Secondary | ICD-10-CM

## 2018-12-27 DIAGNOSIS — I10 Essential (primary) hypertension: Secondary | ICD-10-CM | POA: Diagnosis not present

## 2018-12-27 DIAGNOSIS — E119 Type 2 diabetes mellitus without complications: Secondary | ICD-10-CM

## 2018-12-27 DIAGNOSIS — I25810 Atherosclerosis of coronary artery bypass graft(s) without angina pectoris: Secondary | ICD-10-CM | POA: Diagnosis not present

## 2018-12-27 DIAGNOSIS — Z01818 Encounter for other preprocedural examination: Secondary | ICD-10-CM

## 2018-12-27 MED ORDER — NITROGLYCERIN 0.4 MG/SPRAY TL SOLN: 1.0000 | 1 refills | Status: DC | PRN

## 2018-12-27 NOTE — Patient Instructions (Addendum)
Medication Instructions:   You will need to hold your Aspirin for 7 days prior to surgery  You will need to hold your Plavix for 5 days prior to surgery  Restart both the Aspirin and Plavix as soon as possible at the  discretion of the surgeon, Dr. Donald Pore  *If you need a refill on your cardiac medications before your next appointment, please call your pharmacy*  Lab Work: NONE ordered at this time of appointment   If you have labs (blood work) drawn today and your tests are completely normal, you will receive your results only by: Marland Kitchen MyChart Message (if you have MyChart) OR . A paper copy in the mail If you have any lab test that is abnormal or we need to change your treatment, we will call you to review the results.  Testing/Procedures: NONE ordered at this time of appointment   Follow-Up: At [facility], you and your health needs are our priority.  As part of our continuing mission to provide you with exceptional heart care, we have created designated Provider Care Teams.  These Care Teams include your primary Cardiologist (physician) and Advanced Practice Providers (APPs -  Physician Assistants and Nurse Practitioners) who all work together to provide you with the care you need, when you need it.  Your next appointment:   6 months  The format for your next appointment:   In Person  Provider:   Raliegh Ip Mali Hilty, MD  Other Instructions  You are cleared for surgery

## 2018-12-27 NOTE — Progress Notes (Signed)
Cardiology Office Note    Date:  12/28/2018   ID:  Goldy, Calandra [birth year], MRN [id number]  PCP:  Theodoro Kos, MD  Cardiologist:  Dr. Rennis Golden  Chief Complaint  Patient presents with  . Pre-op Exam    upcoming back surgery by Dr. Maeola Harman    History of Present Illness:  Tamara Nunez is a 63 y.o. female with PMH of hypertension, hyperlipidemia, DM 2, fibromyalgia and CAD s/p CABG 2006 with LIMA-LAD, SVG-distal LAD.  Repeat cardiac catheterization in 2009 showed widely patent grafts.  She has significant headache on Imdur.  She underwent cardiac catheterization by Dr. Herbie Baltimore in July 2018 demonstrating 80% proximal RCA lesion treated with 2.5 x 8 mm DES, she was also found to have atretic LIMA to LAD with 100% occlusion to mid LAD, SVG to distal LAD was normal.  Due to worsening fatigue and exertional symptoms, she underwent another cardiac catheterization on 06/25/2018, this showed 100% proximal to mid LAD occlusion, 70% ostial OM 2, patent left circumflex stent, 90% ostial D1 lesion treated with a 2.5 x 16 mm DES, EF 55 to 65%.  She has been compliant with her medication.  She denies any shortness of breath, however does have very rare episode of chest pain once every 2 to 3 weeks.  The symptom only last a few minutes and it does not occur with physical exertion.  Overall she is doing quite well from a heart perspective.  She may hold her aspirin for 7 days and her Plavix for 5 days prior to the back surgery and restart as soon as possible.  Even though ideally she need to continue dual antiplatelet therapy for 1 year, however the case was reviewed by Dr. Rennis Golden who suggested patient had his outside the 57-month period and may hold antiplatelet therapy given the urgency and necessity of the procedure to improve her quality of life.   Past Medical History:  Diagnosis Date  . Arthritis    "in my back" (05/06/2017)  . CAD (coronary artery disease)    CABG 2006, 09/2016 DES RCA,  04/2017 DES Circ, 06/2018 DES 1st diag  . Chronic back pain   . Coronary artery disease    a. s/p CABG in 2006 with LIMA-LAD, SVG-dLAD. b. cath in 2009 showing patent grafts. c. 09/2016: cath with occluded LAD but patent SVG-dLAD. LIMA was atretic. pRCA with 80% stenosis --> treated with DES  . Fibromyalgia   . GERD (gastroesophageal reflux disease)   . Hyperlipidemia   . Hypertension   . Type II diabetes mellitus (HCC)     Past Surgical History:  Procedure Laterality Date  . ABDOMINAL HERNIA REPAIR  2018  . BACK SURGERY    . CARDIAC CATHETERIZATION  01/13/2008  . CORONARY ANGIOPLASTY WITH STENT PLACEMENT  09/26/2016   Prox RCA lesion, 80 %stenosed. DES   . CORONARY ARTERY BYPASS GRAFT  12/23/2004   2 vessel - LIMA to LAD (atretic), SVG to distal LAD  . CORONARY STENT INTERVENTION N/A 09/26/2016   Procedure: Coronary Stent Intervention;  Surgeon: Marykay Lex, MD;  Location: [facility] INVASIVE CV LAB;  Service: Cardiovascular;  Laterality: N/A;  . CORONARY STENT INTERVENTION N/A 05/06/2017   Procedure: CORONARY STENT INTERVENTION;  Surgeon: Kathleene Hazel, MD;  Location: [facility] INVASIVE CV LAB;  Service: Cardiovascular;  Laterality: N/A;  . CORONARY STENT INTERVENTION N/A 06/25/2018   Procedure: CORONARY STENT INTERVENTION;  Surgeon: Swaziland, Peter M, MD;  Location: [facility] INVASIVE CV LAB;  Service: Cardiovascular;  Laterality: N/A;  . DOPPLER ECHOCARDIOGRAPHY  04/12/2008   EF 60%  . GASTRIC BYPASS  2009  . HERNIA REPAIR    . LEFT HEART CATH AND CORONARY ANGIOGRAPHY N/A 05/06/2017   Procedure: LEFT HEART CATH AND CORONARY ANGIOGRAPHY;  Surgeon: Burnell Blanks, MD;  Location: [place] CV LAB;  Service: Cardiovascular;  Laterality: N/A;  . LEFT HEART CATH AND CORS/GRAFTS ANGIOGRAPHY N/A 09/26/2016   Procedure: Left Heart Cath and Cors/Grafts Angiography;  Surgeon: Leonie Man, MD;  Location: [place] CV LAB;  Service: Cardiovascular;  Laterality: N/A;  . LEFT HEART CATH AND  CORS/GRAFTS ANGIOGRAPHY N/A 06/25/2018   Procedure: LEFT HEART CATH AND CORS/GRAFTS ANGIOGRAPHY;  Surgeon: Martinique, Peter M, MD;  Location: [place] CV LAB;  Service: Cardiovascular;  Laterality: N/A;  . NM MYOVIEW LTD  06/03/2012   EF 71%  . POSTERIOR FUSION LUMBAR SPINE  1990s  . TUBAL LIGATION    . VAGINAL HYSTERECTOMY     "partial"    Current Medications: Outpatient Medications Prior to Visit  Medication Sig Dispense Refill  . acetaminophen (TYLENOL) 500 MG tablet Take 1,000 mg by mouth every 6 (six) hours as needed for moderate pain or headache.    . albuterol (PROVENTIL HFA;VENTOLIN HFA) 108 (90 BASE) MCG/ACT inhaler Inhale 2 puffs into the lungs every 6 (six) hours as needed for wheezing.    Marland Kitchen aspirin EC 81 MG tablet Take 81 mg by mouth daily.    . celecoxib (CELEBREX) 200 MG capsule TAKE 1 CAPSULE BY MOUTH EVERY DAY WITH FOOD    . cetirizine (ZYRTEC) 10 MG tablet Take 10 mg by mouth as needed for allergies.     . Cholecalciferol (VITAMIN D3) 125 MCG (5000 UT) CAPS Take 1 capsule (5,000 Units total) by mouth daily. 90 capsule 1  . clopidogrel (PLAVIX) 75 MG tablet Take 1 tablet (75 mg total) by mouth daily with breakfast. 90 tablet 3  . diclofenac (CATAFLAM) 50 MG tablet TAKE 1 TABLET BY MOUTH EVERY 8 HOURS AS NEEDED PRN    . diclofenac sodium (VOLTAREN) 1 % GEL APPLY 1 APPLICATION (TOPICAL) 4 TIMES PER DAY FOR 5 DAYS    . fluticasone (FLONASE) 50 MCG/ACT nasal spray Place 1 spray into both nostrils daily as needed for allergies.     . furosemide (LASIX) 40 MG tablet TAKE 1 TABLET BY MOUTH  DAILY AS NEEDED FOR FLUID  OR EDEMA. 90 tablet 3  . gabapentin (NEURONTIN) 600 MG tablet Take 600 mg by mouth 3 (three) times daily.     . isosorbide mononitrate (IMDUR) 60 MG 24 hr tablet TAKE 1 AND 1/2 TABLETS BY  MOUTH TWO TIMES DAILY 270 tablet 3  . lidocaine (LIDODERM) 5 % Place 2 patches onto the skin daily as needed (for pain). Remove & Discard patch within 12 hours or as directed by MD      . metoprolol tartrate (LOPRESSOR) 25 MG tablet Take 25 mg by mouth 2 (two) times daily.    Glory Rosebush ULTRA test strip     . pantoprazole (PROTONIX) 40 MG tablet Take 40 mg by mouth daily as needed (acid reflux).     . polyethylene glycol powder (GLYCOLAX/MIRALAX) powder Take 17 g by mouth daily.     . ramipril (ALTACE) 5 MG capsule Take 5 mg by mouth at bedtime.     . ranolazine (RANEXA) 1000 MG SR tablet Take 1 tablet (1,000 mg total) by mouth 2 (two) times daily. [place]  tablet 0  . simvastatin (ZOCOR) 20 MG tablet Take 20 mg by mouth at bedtime.    Marland Kitchen. tiZANidine (ZANAFLEX) 4 MG tablet TAKE 0.5 1 TABLETS BY MOUTH EVERY 8 HOURS AS NEEDED FOR MUSCLE SPASM    . vitamin B-12 (CYANOCOBALAMIN) 1000 MCG tablet Take 1,000 mcg by mouth daily.    . Vitamin D, Ergocalciferol, (DRISDOL) 1.25 MG (50000 UT) CAPS capsule Take 1 capsule (50,000 Units total) by mouth every 7 (seven) days. 4 capsule 0  . vitamin E 400 UNIT capsule Take 400 Units by mouth daily.    . nitroGLYCERIN (NITROLINGUAL) 0.4 MG/SPRAY spray Place 1 spray under the tongue every 5 (five) minutes as needed for chest pain. 12 g 1   No facility-administered medications prior to visit.      Allergies:   Codeine, Other, Ciprofloxacin, Valsartan, Adhesive [tape], and Sulfa antibiotics   Social History   Socioeconomic History  . Marital status: Married    Spouse name: Not on file  . Number of children: Not on file  . Years of education: Not on file  . Highest education level: Not on file  Occupational History  . Not on file  Social Needs  . Financial resource strain: Not on file  . Food insecurity    Worry: Not on file    Inability: Not on file  . Transportation needs    Medical: Not on file    Non-medical: Not on file  Tobacco Use  . Smoking status: Never Smoker  . Smokeless tobacco: Never Used  Substance and Sexual Activity  . Alcohol use: No  . Drug use: No  . Sexual activity: Not on file  Lifestyle  . Physical activity     Days per week: Not on file    Minutes per session: Not on file  . Stress: Not on file  Relationships  . Social Musicianconnections    Talks on phone: Not on file    Gets together: Not on file    Attends religious service: Not on file    Active member of club or organization: Not on file    Attends meetings of clubs or organizations: Not on file    Relationship status: Not on file  Other Topics Concern  . Not on file  Social History Narrative  . Not on file     Family History:  The patient's family history includes Cancer - Lung in her father; Heart disease in her mother.   ROS:   Please see the history of present illness.    ROS All other systems reviewed and are negative.   PHYSICAL EXAM:   VS:  BP 134/80   Pulse (!) 56   Temp (!) 95.9 F (35.5 C) (Other (Comment)) Comment (Src): Forehead  Ht 5\' 4"  (1.626 m)   Wt 172 lb (78 kg)   SpO2 99%   BMI 29.52 kg/m    GEN: Well nourished, well developed, in no acute distress  HEENT: normal  Neck: no JVD, carotid bruits, or masses Cardiac: RRR; no murmurs, rubs, or gallops,no edema  Respiratory:  clear to auscultation bilaterally, normal work of breathing GI: soft, nontender, nondistended, + BS MS: no deformity or atrophy  Skin: warm and dry, no rash Neuro:  Alert and Oriented x 3, Strength and sensation are intact Psych: euthymic mood, full affect  Wt Readings from Last 3 Encounters:  12/27/18 172 lb (78 kg)  10/18/18 166 lb (75.3 kg)  07/05/18 165 lb 3.2 oz (74.9 kg)  Studies/Labs Reviewed:   EKG:  EKG is ordered today.  The ekg ordered today demonstrates normal sinus rhythm, right bundle branch block.  Recent Labs: 05/12/2018: TSH 1.47 07/05/2018: Hemoglobin 9.7; Platelets 404 10/18/2018: ALT 13; BUN 20; Creat 0.94; Potassium 4.7; Sodium 144   Lipid Panel    Component Value Date/Time   CHOL 131 07/05/2018 1152   TRIG 48 07/05/2018 1152   HDL 83 07/05/2018 1152   CHOLHDL 1.6 07/05/2018 1152   CHOLHDL 1.8 04/15/2010  0400   VLDL 3 04/15/2010 0400   LDLCALC 38 07/05/2018 1152    Additional studies/ records that were reviewed today include:   Cath 06/25/2018  Prox LAD to Mid LAD lesion is 100% stenosed.  Ost 2nd Mrg lesion is 70% stenosed.  Prox Cx lesion is 20% stenosed.  Mid Cx lesion is 20% stenosed.  Previously placed Ost Cx drug eluting stent is widely patent.  Prox RCA lesion is 10% stenosed.  Prox RCA to Mid RCA lesion is 30% stenosed.  LIMA.  SVG and is normal in caliber.  Ost 1st Diag lesion is 90% stenosed.  Post intervention, there is a 0% residual stenosis.  A drug-eluting stent was successfully placed using a STENT SYNERGY DES 2.5X16.  The left ventricular systolic function is normal.  LV end diastolic pressure is normal.  The left ventricular ejection fraction is 55-65% by visual estimate.   1. Single vessel obstructive CAD    - 100% mid LAD. Vessel supplied by SVG    - 90% proximal first diagonal. This is a new lesion and felt to be the culprit vessel 2. Continued patency of stents in the ostial LCx and proximal RCA 3. Normal LV function 4. Normal LVEDP 5. Successful PCI of the first diagonal with DES x 1.  Plan: DAPT for at least a year. Given long difficult procedure and groin access we will observe overnight. Anticipate DC in am.   ASSESSMENT:    1. Pre-op evaluation   2. Coronary artery disease involving coronary bypass graft of native heart without angina pectoris   3. Essential hypertension   4. Hyperlipidemia LDL goal <70   5. Controlled type 2 diabetes mellitus without complication, without long-term current use of insulin (HCC)      PLAN:  In order of problems listed above:  1. Preoperative clearance: The case was recently reviewed by Dr. Rennis Golden, although patient had stent placement in April 2020, however given the urgency of the procedure, Dr. Rennis Golden has cleared the patient to hold aspirin for 7 days and Plavix for 5 days prior to the procedure  and restart as soon as possible afterward.  Although patient has occasional chest discomfort, this does not occur with physical exertion.  Symptom is not increasing in frequency or duration.  RCRI class II risk 0.9% risk of major cardiac event.  2. CAD: On aspirin and Plavix.  Although patient continued to have intermittent chest discomfort, however this does not occur with physical exertion.  3. Hypertension: Continue current blood pressure medication  4. Hyperlipidemia: On Zocor 20 mg daily  5. DM2: Managed by primary care provider   Medication Adjustments/Labs and Tests Ordered: Current medicines are reviewed at length with the patient today.  Concerns regarding medicines are outlined above.  Medication changes, Labs and Tests ordered today are listed in the Patient Instructions below. Patient Instructions  Medication Instructions:   You will need to hold your Aspirin for 7 days prior to surgery  You will need to hold your  Plavix for 5 days prior to surgery  Restart both the Aspirin and Plavix as soon as possible at the  discretion of the surgeon, Dr. Rush Farmer  *If you need a refill on your cardiac medications before your next appointment, please call your pharmacy*  Lab Work: NONE ordered at this time of appointment   If you have labs (blood work) drawn today and your tests are completely normal, you will receive your results only by: Marland Kitchen MyChart Message (if you have MyChart) OR . A paper copy in the mail If you have any lab test that is abnormal or we need to change your treatment, we will call you to review the results.  Testing/Procedures: NONE ordered at this time of appointment   Follow-Up: At [facility], you and your health needs are our priority.  As part of our continuing mission to provide you with exceptional heart care, we have created designated Provider Care Teams.  These Care Teams include your primary Cardiologist (physician) and Advanced Practice Providers (APPs  -  Physician Assistants and Nurse Practitioners) who all work together to provide you with the care you need, when you need it.  Your next appointment:   6 months  The format for your next appointment:   In Person  Provider:   Kirtland Bouchard Italy Hilty, MD  Other Instructions  You are cleared for surgery      Signed, Azalee Course, PA  12/28/2018 11:43 PM    [facility] [address] [place], [place], [place]  [address] Phone: [phone]; Fax: [phone]

## 2018-12-28 ENCOUNTER — Encounter: Payer: Self-pay | Admitting: Physician Assistant

## 2018-12-29 ENCOUNTER — Other Ambulatory Visit: Payer: Self-pay

## 2018-12-29 MED ORDER — ONETOUCH ULTRA VI STRP: ORAL_STRIP | 1 refills | Status: DC

## 2019-01-11 NOTE — H&P (Signed)
Patient ID:   [id number] Patient: Tamara Nunez  Date of Birth: [birth year] Visit Type: Office Visit   Date: 11/24/2018 11:00 AM Provider: Marchia Meiers. Vertell Limber MD   This 63 year old female presents for back pain.  HISTORY OF PRESENT ILLNESS: 1.  back pain  Patient returns to review her lumbar imaging.   X-rays and MRI on [place]  The patient is describing low back pain to 9/10 in severity.  She is complaining severe pain and had radiofrequency ablation without relief of her discomfort.  She is also complaining some neck right upper extremity pain.  She has positive Spurling maneuver to the right and pain overlying her right shoulder.  Her motor examination reveals full strength in upper and lower extremities.  I reviewed the patient's MRI and plain radiographs.  She appears to have had a fusion from L3 through S1 levels but has nylon cords from L3-L2 with pedicle screws at L2 and has developed progressive degeneration severe disc disease and severe spinal stenosis at the L2-3 level.  I explained to the patient that this is a problem and from a surgical standpoint she would need to have a revision decompression with fusion at this level if she has continued to have severe pain.  The patient is eager to have this fixed.  She does need to get clearance from Dr. Noelle Penner, her cardiologist before proceeding.  The plan would be a redo decompression with fusion at the L2-3 level.  She would need to stop her Plavix preoperatively.      Medical/Surgical/Interim History Reviewed, no change.     PAST MEDICAL HISTORY, SURGICAL HISTORY, FAMILY HISTORY, SOCIAL HISTORY AND REVIEW OF SYSTEMS I have reviewed the patient's past medical, surgical, family and social history as well as the comprehensive review of systems as included on the [facility] history form dated 10/27/2018, which I have signed.  Family History: Reviewed, no changes.    Social History: Reviewed, no  changes.   MEDICATIONS: (added, continued or stopped this visit) Started Medication Directions Instruction Stopped  Altace 5 mg capsule take 1 capsule by oral route  every day    aspirin 325 mg  BUCCAL     calcium carb-vit D3-minerals 600 mg calcium-400 unit tablet Take 1 tablet by mouth two times a day    cetirizine 10 mg tablet take 1 tablet by oral route  every day    cyanocobalamin (vit B-12) 1,000 mcg tablet Take 1 tablet daily    diclofenac 5% TOPICAL GEL (GRAM)     furosemide 20 mg tablet take 1 tablet by oral route  every day    isosorbide mononitrate ER 30 mg tablet,extended release 24 hr take 1 tablet by oral route  every day in the morning    Lexapro 20 mg tablet take 1 tablet by oral route  every day    Lidoderm 5 % topical patch apply 1 patch by transdermal route  every day (May wear up to 12hours.)    metformin 500 mg tablet take 1/2 tablet by oral route 2 times every day with morning and evening meals    metoprolol tartrate 25 mg tablet take 1 tablet by oral route 2 times every day    Miralax 17 gram/dose oral powder take (17G)  by oral route  every day mixed with 8 oz. water, juice, soda, coffee or tea    multivitamin tablet Take daily    nitroglycerin 0.4 mg sublingual tablet place 1 tablet by sublingual route  at 1st sign of attack; may repeat every 5 minutes up to 3 tabs; if norelief seek medical help    Nortriptyline ORAL  BUCCAL     pantoprazole 40 mg tablet,delayed release take 1 tablet by oral route  every day    Plavix 75 mg tablet take 1 tablet by oral route  every day    Prevacid 30 mg capsule,delayed release take 1 capsule by oral route  every day before a meal    ProAir RespiClick 90 mcg/actuation breath activated inhale 2 puff by inhalation route  every 4 - 6 hours as needed    Ranexa 1,000 mg tablet,extended release take 1 tablet by oral route 2 times every day    simvastatin  20 mg tablet take 1 tablet by oral route  every day in the evening    Veramyst 27.5 mcg/actuation nasal spray,suspension spray 2 spray by intranasal route  every day in each nostril      ALLERGIES: Ingredient Reaction Medication Name Comment SULFA (SULFONAMIDE ANTIBIOTICS) Nausea   CIPROFLOXACIN Unknown   ADHESIVE TAPE    CODEINE Unknown       PHYSICAL EXAM:  Vitals Date Temp F BP Pulse Ht In Wt Lb BMI BSA Pain Score 11/24/2018  112/62 79 64 164 28.15  7/10     IMPRESSION:  Pseudoarthrosis L2-3 with severe spinal stenosis  PLAN: L2-3 redo decompression with fusion pending cardiology clearance  Orders: Diagnostic Procedures: Assessment Procedure M48.062 Lumbar Spine- AP/Lat Instruction(s)/Education: Assessment Instruction Z68.28 Dietary management education, guidance, and counseling Miscellaneous: Assessment  M48.062 LSO Brace  Completed Orders (this encounter) Order Details Reason Side Interpretation Result Initial Treatment Date Region Dietary management education, guidance, and counseling Eat a well rounded diet high in fruit and vegetables        Assessment/Plan  # Detail Type Description  1. Assessment Low back pain, unspecified back pain laterality, with sciatica presence unspecified (M54.5).     2. Assessment Spinal stenosis, lumbar region with neurogenic claudication (M48.062).  Plan Orders LSO Brace.     3. Assessment Unsp fracture of unsp lum vertebra, subs for fx w nonunion (S32.009K).     4. Assessment Radiculopathy, lumbar region (M54.16).     5. Assessment Body mass index (BMI) 28.0-28.9, adult (Q68.34).  Plan Orders Today's instructions / counseling include(s) Dietary management education, guidance, and counseling. Clinical information/comments: Eat a well rounded diet high in fruit and vegetables.       Pain Management Plan Pain Scale: 7/10. Method:  Numeric Pain Intensity Scale. Location: Back. Onset: 07/09/2014. Duration: varies. Quality: discomforting. Pain management follow-up plan of care: Patient is to use pain medication as directed.              Provider:  Danae Orleans. Venetia Maxon MD  11/27/2018 01:23 PM    Dictation edited by: Danae Orleans. Venetia Maxon    CC Providers: Majel Homer  [facility] [address] [place], [place] [address]-               Electronically signed by Danae Orleans. Venetia Maxon MD on 11/27/2018 01:23 PM

## 2019-01-12 ENCOUNTER — Other Ambulatory Visit: Payer: Self-pay

## 2019-01-12 ENCOUNTER — Encounter: Payer: Commercial Managed Care - PPO | Attending: "Endocrinology | Admitting: Nutrition

## 2019-01-12 DIAGNOSIS — E782 Mixed hyperlipidemia: Secondary | ICD-10-CM | POA: Insufficient documentation

## 2019-01-12 DIAGNOSIS — I10 Essential (primary) hypertension: Secondary | ICD-10-CM

## 2019-01-12 DIAGNOSIS — R739 Hyperglycemia, unspecified: Secondary | ICD-10-CM

## 2019-01-12 NOTE — Patient Instructions (Signed)
Goals  Follow My Plate Eat meals on time Cut out snacks Exercise 30 minutes 3 times per week Lose 1 lb per week Increase lower carb vegetables Don't skip meals.

## 2019-01-24 NOTE — Progress Notes (Signed)
[id number] [address], [place] [address] Phone: [phone] Fax: [phone]  [place], [place] [facility] [address] [place] [address] [place] [address] Phone: [phone] Fax: [phone]      Your procedure is scheduled on November 20  Report to [facility] Main Entrance "A" at 0530 A.M., and check in at the Admitting office.  Call this number if you have problems the morning of surgery:  [phone]  Call [phone] if you have any questions prior to your surgery date Monday-Friday 8am-4pm    Remember:  Do not eat or drink after midnight the night before your surgery     Take these medicines the morning of surgery with A SIP OF WATER  acetaminophen (TYLENOL if needed albuterol (PROVENTIL HFA;VENTOLIN HFA)  If needed, Please bring all inhalers with you the day of surgery.  cetirizine (ZYRTEC fluticasone (FLONASE) if needed gabapentin (NEURONTIN) isosorbide mononitrate (IMDUR) metoprolol tartrate (LOPRESSOR) pantoprazole (PROTONIX) ranolazine (RANEXA) tiZANidine (ZANAFLEX) if needed  Follow your surgeon's instructions on when to stop Aspirin and Plavix.  If no instructions were given by your surgeon then you will need to call the office to get those instructions.    7 days prior to surgery STOP taking any celecoxib (CELEBREX)), diclofenac sodium (VOLTAREN) 1 % GEL, Aleve, Naproxen, Ibuprofen, Motrin, Advil, Goody's, BC's, all herbal medications, fish oil, and all vitamins.    The Morning of Surgery  Do not wear jewelry, make-up or nail polish.  Do not wear lotions, powders, or perfumes/colognes, or deodorant  Do not shave 48 hours prior to surgery.  Do not bring valuables to the hospital.  [facility] is not responsible for any belongings or valuables.  If you are a smoker, DO NOT Smoke 24 hours prior to surgery IF you wear a CPAP at night  please bring your mask, tubing, and machine the morning of surgery   Remember that you must have someone to transport you home after your surgery, and remain with you for 24 hours if you are discharged the same day.   Contacts, glasses, hearing aids, dentures or bridgework may not be worn into surgery.    Leave your suitcase in the car.  After surgery it may be brought to your room.  For patients admitted to the hospital, discharge time will be determined by your treatment team.  Patients discharged the day of surgery will not be allowed to drive home.    Special instructions:   [facility]- Preparing For Surgery  Before surgery, you can play an important role. Because skin is not sterile, your skin needs to be as free of germs as possible. You can reduce the number of germs on your skin by washing with CHG (chlorahexidine gluconate) Soap before surgery.  CHG is an antiseptic cleaner which kills germs and bonds with the skin to continue killing germs even after washing.    Oral Hygiene is also important to reduce your risk of infection.  Remember - BRUSH YOUR TEETH THE MORNING OF SURGERY WITH YOUR REGULAR TOOTHPASTE  Please do not use if you have an allergy to CHG or antibacterial soaps. If your skin becomes reddened/irritated stop using the CHG.  Do not shave (including legs and underarms) for at least 48 hours prior to first CHG shower. It is OK to shave your face.  Please follow these instructions carefully.   1. Shower  the NIGHT BEFORE SURGERY and the MORNING OF SURGERY with CHG Soap.   2. If you chose to wash your hair, wash your hair first as usual with your normal shampoo.  3. After you shampoo, rinse your hair and body thoroughly to remove the shampoo.  4. Use CHG as you would any other liquid soap. You can apply CHG directly to the skin and wash gently with a scrungie or a clean washcloth.   5. Apply the CHG Soap to your body ONLY FROM THE NECK DOWN.  Do not use on open  wounds or open sores. Avoid contact with your eyes, ears, mouth and genitals (private parts). Wash Face and genitals (private parts)  with your normal soap.   6. Wash thoroughly, paying special attention to the area where your surgery will be performed.  7. Thoroughly rinse your body with warm water from the neck down.  8. DO NOT shower/wash with your normal soap after using and rinsing off the CHG Soap.  9. Pat yourself dry with a CLEAN TOWEL.  10. Wear CLEAN PAJAMAS to bed the night before surgery, wear comfortable clothes the morning of surgery  11. Place CLEAN SHEETS on your bed the night of your first shower and DO NOT SLEEP WITH PETS.    Day of Surgery:  Do not apply any deodorants/lotions. Please shower the morning of surgery with the CHG soap  Please wear clean clothes to the hospital/surgery center.   Remember to brush your teeth WITH YOUR REGULAR TOOTHPASTE.   Please read over the following fact sheets that you were given.

## 2019-01-25 ENCOUNTER — Other Ambulatory Visit (HOSPITAL_COMMUNITY)
Admission: RE | Admit: 2019-01-25 | Discharge: 2019-01-25 | Disposition: A | Discharge status: Home / Self Care | Payer: Commercial Managed Care - PPO | Source: Ambulatory Visit | Attending: Neurosurgery | Admitting: Neurosurgery

## 2019-01-25 ENCOUNTER — Encounter (HOSPITAL_COMMUNITY): Payer: Self-pay

## 2019-01-25 ENCOUNTER — Other Ambulatory Visit: Payer: Self-pay

## 2019-01-25 ENCOUNTER — Encounter (HOSPITAL_COMMUNITY)
Admission: RE | Admit: 2019-01-25 | Discharge: 2019-01-25 | Disposition: A | Discharge status: Home / Self Care | Payer: Commercial Managed Care - PPO | Source: Ambulatory Visit | Attending: Neurosurgery | Admitting: Neurosurgery

## 2019-01-25 DIAGNOSIS — Z01812 Encounter for preprocedural laboratory examination: Secondary | ICD-10-CM | POA: Insufficient documentation

## 2019-01-25 DIAGNOSIS — Z20828 Contact with and (suspected) exposure to other viral communicable diseases: Secondary | ICD-10-CM | POA: Insufficient documentation

## 2019-01-25 LAB — TYPE AND SCREEN
ABO/RH(D): O POS
Antibody Screen: NEGATIVE

## 2019-01-25 LAB — BASIC METABOLIC PANEL
Anion gap: 7 (ref 5–15)
BUN: 15 mg/dL (ref 8–23)
CO2: 22 mmol/L (ref 22–32)
Calcium: 9 mg/dL (ref 8.9–10.3)
Chloride: 108 mmol/L (ref 98–111)
Creatinine, Ser: 0.95 mg/dL (ref 0.44–1.00)
GFR calc Af Amer: >60 mL/min (ref >60)
GFR calc non Af Amer: >60 mL/min (ref >60)
Glucose, Bld: 86 mg/dL (ref 70–99)
Potassium: 4.3 mmol/L (ref 3.5–5.1)
Sodium: 137 mmol/L (ref 135–145)

## 2019-01-25 LAB — CBC
HCT: 33 % — ABNORMAL LOW (ref 36.0–46.0)
Hemoglobin: 9.8 g/dL — ABNORMAL LOW (ref 12.0–15.0)
MCH: 23.3 pg — ABNORMAL LOW (ref 26.0–34.0)
MCHC: 29.7 g/dL — ABNORMAL LOW (ref 30.0–36.0)
MCV: 78.6 fL — ABNORMAL LOW (ref 80.0–100.0)
Platelets: 370 10*3/uL (ref 150–400)
RBC: 4.2 MIL/uL (ref 3.87–5.11)
RDW: 18.1 % — ABNORMAL HIGH (ref 11.5–15.5)
WBC: 5.3 10*3/uL (ref 4.0–10.5)
nRBC: 0 % (ref 0.0–0.2)

## 2019-01-25 LAB — SURGICAL PCR SCREEN
MRSA, PCR: NEGATIVE
Staphylococcus aureus: NEGATIVE

## 2019-01-25 LAB — HEMOGLOBIN A1C
Hgb A1c MFr Bld: 6.3 % — ABNORMAL HIGH (ref 4.8–5.6)
Mean Plasma Glucose: 134.11 mg/dL

## 2019-01-25 LAB — GLUCOSE, CAPILLARY: Glucose-Capillary: 80 mg/dL (ref 70–99)

## 2019-01-25 LAB — ABO/RH: ABO/RH(D): O POS

## 2019-01-25 NOTE — Progress Notes (Signed)
PCP - Geradine Girt Cardiologist - [place]   Chest x-ray - n/a EKG - 12/27/18 Stress Test - 01/15/18 ECHO - 2010 Cardiac Cath - 06/25/18  Fasting Blood Sugar - 80s-90s Checks Blood Sugar __2___ times a day  Blood Thinner Instructions: LD plavix 11/15 Aspirin Instructions: LD 11/13  COVID TEST- 11/17    Anesthesia review: yes, CAD hx  Patient denies shortness of breath, fever, cough and chest pain at PAT appointment   All instructions explained to the patient, with a verbal understanding of the material. Patient agrees to go over the instructions while at home for a better understanding. Patient also instructed to self quarantine after being tested for COVID-19. The opportunity to ask questions was provided.

## 2019-01-26 LAB — NOVEL CORONAVIRUS, NAA (HOSP ORDER, SEND-OUT TO REF LAB; TAT 18-24 HRS): SARS-CoV-2, NAA: NOT DETECTED

## 2019-01-26 NOTE — Progress Notes (Signed)
Anesthesia Chart Review: Follows with cardiology for hx of CAD s/p CABG 2006 with LIMA-LAD, SVG-distal LAD.  Repeat cardiac catheterization in 2009 showed widely patent grafts. She underwent cardiac catheterization by Dr. Ellyn Hack in July 2018 demonstrating 80% proximal RCA lesion treated with 2.5 x 8 mm DES, she was also found to have atretic LIMA to LAD with 100% occlusion to mid LAD, SVG to distal LAD was normal.  Due to worsening fatigue and exertional symptoms, she underwent another cardiac catheterization on 06/25/2018, this showed 100% proximal to mid LAD occlusion, 70% ostial OM 2, patent left circumflex stent, 90% ostial D1 lesion treated with a 2.5 x 16 mm DES, EF 55 to 65%.  Pt seen 12/27/18 for cardiac clearance. Per note "The case was recently reviewed by Dr. Debara Pickett, although patient had stent placement in April 2020, however given the urgency of the procedure, Dr. Debara Pickett has cleared the patient to hold aspirin for 7 days and Plavix for 5 days prior to the procedure and restart as soon as possible afterward.  Although patient has occasional chest discomfort, this does not occur with physical exertion.  Symptom is not increasing in frequency or duration.  RCRI class II risk 0.9% risk of major cardiac event."  Preop labs reviewed. Moderate anemia with Hgb 9.8 Review of history shows chronic anemia with baseline Hgb ~9.5-10.0. Result called to Dr. Melven Sartorius office. Well controlled DMII with A1c 6.3.  EKG 12/27/18: NSR. Rate 56. RBBB.  Cath and PCI 06/25/18: 1. Single vessel obstructive CAD    - 100% mid LAD. Vessel supplied by SVG    - 90% proximal first diagonal. This is a new lesion and felt to be the culprit vessel 2. Continued patency of stents in the ostial LCx and proximal RCA 3. Normal LV function 4. Normal LVEDP 5. Successful PCI of the first diagonal with DES x 1.  Plan: DAPT for at least a year. Given long difficult procedure and groin access we will observe overnight. Anticipate DC  in am.    Wynonia Musty [facility] Short Stay Center/Anesthesiology Phone [phone] 01/26/2019 3:10 PM

## 2019-01-26 NOTE — Anesthesia Preprocedure Evaluation (Addendum)
Anesthesia Evaluation  Patient identified by MRN, date of birth, ID band Patient awake    Reviewed: Allergy & Precautions, NPO status , Patient's Chart, lab work & pertinent test results  Airway Mallampati: II  TM Distance: >3 FB Neck ROM: Full    Dental  (+) Teeth Intact, Dental Advisory Given   Pulmonary neg pulmonary ROS,    breath sounds clear to auscultation       Cardiovascular hypertension, Pt. on medications + angina + CAD, + Cardiac Stents and + CABG   Rhythm:Regular Rate:Normal  ECG: NSR, rate 56   Neuro/Psych negative neurological ROS     GI/Hepatic Neg liver ROS, GERD  Medicated,  Endo/Other  diabetes  Renal/GU negative Renal ROS     Musculoskeletal  (+) Fibromyalgia -Chronic back pain   Abdominal   Peds  Hematology  (+) anemia , HLD   Anesthesia Other Findings Spinal stenosis, Lumbar region with neurogenic claudication  Reproductive/Obstetrics                           Anesthesia Physical Anesthesia Plan  ASA: III  Anesthesia Plan: General   Post-op Pain Management:    Induction: Intravenous  PONV Risk Score and Plan: 4 or greater and Midazolam, Dexamethasone, Ondansetron and Treatment may vary due to age or medical condition  Airway Management Planned: Oral ETT  Additional Equipment:   Intra-op Plan:   Post-operative Plan: Extubation in OR  Informed Consent: I have reviewed the patients History and Physical, chart, labs and discussed the procedure including the risks, benefits and alternatives for the proposed anesthesia with the patient or authorized representative who has indicated his/her understanding and acceptance.     Dental advisory given  Plan Discussed with:   Anesthesia Plan Comments: (Follows with cardiology for hx of CAD s/p CABG 2006 with LIMA-LAD, SVG-distal LAD.  Repeat cardiac catheterization in 2009 showed widely patent grafts. She underwent  cardiac catheterization by Dr. Ellyn Hack in July 2018 demonstrating 80% proximal RCA lesion treated with 2.5 x 8 mm DES, she was also found to have atretic LIMA to LAD with 100% occlusion to mid LAD, SVG to distal LAD was normal.  Due to worsening fatigue and exertional symptoms, she underwent another cardiac catheterization on 06/25/2018, this showed 100% proximal to mid LAD occlusion, 70% ostial OM 2, patent left circumflex stent, 90% ostial D1 lesion treated with a 2.5 x 16 mm DES, EF 55 to 65%.  Pt seen 12/27/18 for cardiac clearance. Per note "The case was recently reviewed by Dr. Debara Pickett, although patient had stent placement in April 2020, however given the urgency of the procedure, Dr. Debara Pickett has cleared the patient to hold aspirin for 7 days and Plavix for 5 days prior to the procedure and restart as soon as possible afterward.  Although patient has occasional chest discomfort, this does not occur with physical exertion.  Symptom is not increasing in frequency or duration.  RCRI class II risk 0.9% risk of major cardiac event."  Preop labs reviewed. Moderate anemia with Hgb 9.8 Review of history shows chronic anemia with baseline Hgb ~9.5-10.0. Result called to Dr. Melven Sartorius office. Well controlled DMII with A1c 6.3.  EKG 12/27/18: NSR. Rate 56. RBBB.  Cath and PCI 06/25/18: 1. Single vessel obstructive CAD    - 100% mid LAD. Vessel supplied by SVG    - 90% proximal first diagonal. This is a new lesion and felt to be the culprit vessel 2. Continued  patency of stents in the ostial LCx and proximal RCA 3. Normal LV function 4. Normal LVEDP 5. Successful PCI of the first diagonal with DES x 1.  Plan: DAPT for at least a year. Given long difficult procedure and groin access we will observe overnight. Anticipate DC in am. )      Anesthesia Quick Evaluation

## 2019-01-28 ENCOUNTER — Encounter (HOSPITAL_COMMUNITY): Payer: Self-pay | Admitting: *Deleted

## 2019-01-28 ENCOUNTER — Inpatient Hospital Stay (HOSPITAL_COMMUNITY): Payer: Commercial Managed Care - PPO

## 2019-01-28 ENCOUNTER — Encounter (HOSPITAL_COMMUNITY)
Admission: RE | Disposition: A | Discharge status: Home / Self Care | Payer: Self-pay | Source: Home / Self Care | Attending: Neurosurgery

## 2019-01-28 ENCOUNTER — Other Ambulatory Visit: Payer: Self-pay

## 2019-01-28 ENCOUNTER — Inpatient Hospital Stay (HOSPITAL_COMMUNITY): Payer: Commercial Managed Care - PPO | Admitting: Physician Assistant

## 2019-01-28 ENCOUNTER — Inpatient Hospital Stay (HOSPITAL_COMMUNITY): Payer: Commercial Managed Care - PPO | Admitting: Anesthesiology

## 2019-01-28 ENCOUNTER — Inpatient Hospital Stay (HOSPITAL_COMMUNITY)
Admission: RE | Admit: 2019-01-28 | Discharge: 2019-01-31 | DRG: 454 | Disposition: A | Discharge status: Home Health | Payer: Commercial Managed Care - PPO | Attending: Neurosurgery | Admitting: Neurosurgery

## 2019-01-28 DIAGNOSIS — M4726 Other spondylosis with radiculopathy, lumbar region: Secondary | ICD-10-CM | POA: Diagnosis present

## 2019-01-28 DIAGNOSIS — Z91048 Other nonmedicinal substance allergy status: Secondary | ICD-10-CM

## 2019-01-28 DIAGNOSIS — Z951 Presence of aortocoronary bypass graft: Secondary | ICD-10-CM

## 2019-01-28 DIAGNOSIS — Y831 Surgical operation with implant of artificial internal device as the cause of abnormal reaction of the patient, or of later complication, without mention of misadventure at the time of the procedure: Secondary | ICD-10-CM | POA: Diagnosis present

## 2019-01-28 DIAGNOSIS — E119 Type 2 diabetes mellitus without complications: Secondary | ICD-10-CM | POA: Diagnosis present

## 2019-01-28 DIAGNOSIS — M48062 Spinal stenosis, lumbar region with neurogenic claudication: Secondary | ICD-10-CM | POA: Diagnosis present

## 2019-01-28 DIAGNOSIS — M5116 Intervertebral disc disorders with radiculopathy, lumbar region: Secondary | ICD-10-CM | POA: Diagnosis present

## 2019-01-28 DIAGNOSIS — I4510 Unspecified right bundle-branch block: Secondary | ICD-10-CM | POA: Diagnosis present

## 2019-01-28 DIAGNOSIS — Y752 Prosthetic and other implants, materials and neurological devices associated with adverse incidents: Secondary | ICD-10-CM | POA: Diagnosis present

## 2019-01-28 DIAGNOSIS — M797 Fibromyalgia: Secondary | ICD-10-CM | POA: Diagnosis present

## 2019-01-28 DIAGNOSIS — Z885 Allergy status to narcotic agent status: Secondary | ICD-10-CM | POA: Diagnosis not present

## 2019-01-28 DIAGNOSIS — M960 Pseudarthrosis after fusion or arthrodesis: Secondary | ICD-10-CM | POA: Diagnosis present

## 2019-01-28 DIAGNOSIS — Z7984 Long term (current) use of oral hypoglycemic drugs: Secondary | ICD-10-CM | POA: Diagnosis not present

## 2019-01-28 DIAGNOSIS — Z79899 Other long term (current) drug therapy: Secondary | ICD-10-CM

## 2019-01-28 DIAGNOSIS — R197 Diarrhea, unspecified: Secondary | ICD-10-CM | POA: Diagnosis not present

## 2019-01-28 DIAGNOSIS — D649 Anemia, unspecified: Secondary | ICD-10-CM | POA: Diagnosis present

## 2019-01-28 DIAGNOSIS — K219 Gastro-esophageal reflux disease without esophagitis: Secondary | ICD-10-CM | POA: Diagnosis present

## 2019-01-28 DIAGNOSIS — M48061 Spinal stenosis, lumbar region without neurogenic claudication: Secondary | ICD-10-CM | POA: Diagnosis present

## 2019-01-28 DIAGNOSIS — Z955 Presence of coronary angioplasty implant and graft: Secondary | ICD-10-CM | POA: Diagnosis not present

## 2019-01-28 DIAGNOSIS — Z7982 Long term (current) use of aspirin: Secondary | ICD-10-CM | POA: Diagnosis not present

## 2019-01-28 DIAGNOSIS — Z20828 Contact with and (suspected) exposure to other viral communicable diseases: Secondary | ICD-10-CM | POA: Diagnosis present

## 2019-01-28 DIAGNOSIS — Z881 Allergy status to other antibiotic agents status: Secondary | ICD-10-CM | POA: Diagnosis not present

## 2019-01-28 DIAGNOSIS — Z882 Allergy status to sulfonamides status: Secondary | ICD-10-CM | POA: Diagnosis not present

## 2019-01-28 DIAGNOSIS — Z791 Long term (current) use of non-steroidal anti-inflammatories (NSAID): Secondary | ICD-10-CM | POA: Diagnosis not present

## 2019-01-28 DIAGNOSIS — Z419 Encounter for procedure for purposes other than remedying health state, unspecified: Secondary | ICD-10-CM

## 2019-01-28 DIAGNOSIS — I2510 Atherosclerotic heart disease of native coronary artery without angina pectoris: Secondary | ICD-10-CM | POA: Diagnosis present

## 2019-01-28 DIAGNOSIS — I10 Essential (primary) hypertension: Secondary | ICD-10-CM | POA: Diagnosis present

## 2019-01-28 DIAGNOSIS — Z7902 Long term (current) use of antithrombotics/antiplatelets: Secondary | ICD-10-CM | POA: Diagnosis not present

## 2019-01-28 DIAGNOSIS — Z888 Allergy status to other drugs, medicaments and biological substances status: Secondary | ICD-10-CM | POA: Diagnosis not present

## 2019-01-28 LAB — GLUCOSE, CAPILLARY
Glucose-Capillary: 123 mg/dL — ABNORMAL HIGH (ref 70–99)
Glucose-Capillary: 123 mg/dL — ABNORMAL HIGH (ref 70–99)
Glucose-Capillary: 161 mg/dL — ABNORMAL HIGH (ref 70–99)

## 2019-01-28 SURGERY — POSTERIOR LUMBAR FUSION 1 LEVEL
Anesthesia: General | Site: Spine Lumbar

## 2019-01-28 MED ORDER — MENTHOL 3 MG MT LOZG: 1.0000 | LOZENGE | OROMUCOSAL | Status: DC | PRN

## 2019-01-28 MED ORDER — DEXAMETHASONE SODIUM PHOSPHATE 10 MG/ML IJ SOLN
INTRAMUSCULAR | Status: DC | PRN
  Administered 2019-01-28: 10 mg via INTRAVENOUS

## 2019-01-28 MED ORDER — POLYETHYLENE GLYCOL 3350 17 GM/SCOOP PO POWD
17.0000 g | Freq: Every day | ORAL | Status: DC
  Administered 2019-01-29: 17 g via ORAL
  Filled 2019-01-28: qty 255

## 2019-01-28 MED ORDER — GABAPENTIN 600 MG PO TABS
600.0000 mg | ORAL_TABLET | Freq: Three times a day (TID) | ORAL | Status: DC
  Administered 2019-01-28 – 2019-01-31 (×8): 600 mg via ORAL
  Filled 2019-01-28 (×8): qty 1

## 2019-01-28 MED ORDER — NITROGLYCERIN 0.4 MG/SPRAY TL SOLN
1.0000 | Status: DC | PRN
  Filled 2019-01-28: qty 12

## 2019-01-28 MED ORDER — ACETAMINOPHEN 325 MG PO TABS
650.0000 mg | ORAL_TABLET | ORAL | Status: DC | PRN
  Administered 2019-01-28 – 2019-01-30 (×2): 650 mg via ORAL
  Filled 2019-01-28 (×2): qty 2

## 2019-01-28 MED ORDER — SIMVASTATIN 20 MG PO TABS
20.0000 mg | ORAL_TABLET | Freq: Every day | ORAL | Status: DC
  Administered 2019-01-28 – 2019-01-30 (×3): 20 mg via ORAL
  Filled 2019-01-28 (×2): qty 1

## 2019-01-28 MED ORDER — PROPOFOL 10 MG/ML IV BOLUS
INTRAVENOUS | Status: AC
  Filled 2019-01-28: qty 20

## 2019-01-28 MED ORDER — ALUM & MAG HYDROXIDE-SIMETH 200-200-20 MG/5ML PO SUSP: 30.0000 mL | Freq: Four times a day (QID) | ORAL | Status: DC | PRN

## 2019-01-28 MED ORDER — CHLORHEXIDINE GLUCONATE CLOTH 2 % EX PADS: 6.0000 | MEDICATED_PAD | Freq: Once | CUTANEOUS | Status: DC

## 2019-01-28 MED ORDER — INSULIN ASPART 100 UNIT/ML ~~LOC~~ SOLN: 4.0000 [IU] | Freq: Three times a day (TID) | SUBCUTANEOUS | Status: DC

## 2019-01-28 MED ORDER — ONDANSETRON HCL 4 MG/2ML IJ SOLN
INTRAMUSCULAR | Status: DC | PRN
  Administered 2019-01-28 (×2): 4 mg via INTRAVENOUS

## 2019-01-28 MED ORDER — DICLOFENAC SODIUM 1 % TD GEL: 2.0000 g | Freq: Four times a day (QID) | TRANSDERMAL | Status: DC | PRN

## 2019-01-28 MED ORDER — LORATADINE 10 MG PO TABS
10.0000 mg | ORAL_TABLET | Freq: Every day | ORAL | Status: DC
  Administered 2019-01-29 – 2019-01-30 (×2): 10 mg via ORAL
  Filled 2019-01-28 (×3): qty 1

## 2019-01-28 MED ORDER — 0.9 % SODIUM CHLORIDE (POUR BTL) OPTIME
TOPICAL | Status: DC | PRN
  Administered 2019-01-28: 1000 mL

## 2019-01-28 MED ORDER — POLYETHYLENE GLYCOL 3350 17 G PO PACK
17.0000 g | PACK | Freq: Every day | ORAL | Status: DC | PRN
  Filled 2019-01-28: qty 1

## 2019-01-28 MED ORDER — METOPROLOL TARTRATE 25 MG PO TABS
25.0000 mg | ORAL_TABLET | Freq: Two times a day (BID) | ORAL | Status: DC
  Administered 2019-01-28 – 2019-01-31 (×5): 25 mg via ORAL
  Filled 2019-01-28 (×5): qty 1

## 2019-01-28 MED ORDER — DOCUSATE SODIUM 100 MG PO CAPS
100.0000 mg | ORAL_CAPSULE | Freq: Two times a day (BID) | ORAL | Status: DC
  Administered 2019-01-28 – 2019-01-31 (×7): 100 mg via ORAL
  Filled 2019-01-28 (×6): qty 1

## 2019-01-28 MED ORDER — OXYCODONE HCL 5 MG PO TABS
5.0000 mg | ORAL_TABLET | ORAL | Status: DC | PRN
  Administered 2019-01-28: 5 mg via ORAL
  Filled 2019-01-28: qty 1

## 2019-01-28 MED ORDER — METHOCARBAMOL 500 MG PO TABS
500.0000 mg | ORAL_TABLET | Freq: Four times a day (QID) | ORAL | Status: DC | PRN
  Administered 2019-01-28 – 2019-01-29 (×4): 500 mg via ORAL
  Filled 2019-01-28 (×4): qty 1

## 2019-01-28 MED ORDER — FLUTICASONE PROPIONATE 50 MCG/ACT NA SUSP
1.0000 | Freq: Every day | NASAL | Status: DC
  Administered 2019-01-28 – 2019-01-29 (×2): 1 via NASAL
  Filled 2019-01-28: qty 16

## 2019-01-28 MED ORDER — PROPOFOL 10 MG/ML IV BOLUS
INTRAVENOUS | Status: DC | PRN
  Administered 2019-01-28: 150 mg via INTRAVENOUS

## 2019-01-28 MED ORDER — FENTANYL CITRATE (PF) 100 MCG/2ML IJ SOLN
INTRAMUSCULAR | Status: AC
  Filled 2019-01-28: qty 2

## 2019-01-28 MED ORDER — THROMBIN 5000 UNITS EX SOLR
CUTANEOUS | Status: AC
  Filled 2019-01-28: qty 5000

## 2019-01-28 MED ORDER — SODIUM CHLORIDE 0.9% FLUSH
3.0000 mL | Freq: Two times a day (BID) | INTRAVENOUS | Status: DC
  Administered 2019-01-28 – 2019-01-30 (×3): 3 mL via INTRAVENOUS

## 2019-01-28 MED ORDER — CEFAZOLIN SODIUM-DEXTROSE 2-4 GM/100ML-% IV SOLN
2.0000 g | INTRAVENOUS | Status: AC
  Administered 2019-01-28: 2 g via INTRAVENOUS

## 2019-01-28 MED ORDER — LIDOCAINE 2% (20 MG/ML) 5 ML SYRINGE
INTRAMUSCULAR | Status: DC | PRN
  Administered 2019-01-28: 50 mg via INTRAVENOUS

## 2019-01-28 MED ORDER — ROCURONIUM BROMIDE 50 MG/5ML IV SOSY
PREFILLED_SYRINGE | INTRAVENOUS | Status: DC | PRN
  Administered 2019-01-28: 20 mg via INTRAVENOUS
  Administered 2019-01-28: 50 mg via INTRAVENOUS

## 2019-01-28 MED ORDER — THROMBIN 20000 UNITS EX SOLR
CUTANEOUS | Status: AC
  Filled 2019-01-28: qty 20000

## 2019-01-28 MED ORDER — HYDROCODONE-ACETAMINOPHEN 5-325 MG PO TABS
1.0000 | ORAL_TABLET | ORAL | Status: DC | PRN
  Administered 2019-01-29: 2 via ORAL
  Administered 2019-01-31: 1 via ORAL
  Filled 2019-01-28: qty 2
  Filled 2019-01-28: qty 1

## 2019-01-28 MED ORDER — PHENOL 1.4 % MT LIQD: 1.0000 | OROMUCOSAL | Status: DC | PRN

## 2019-01-28 MED ORDER — ALBUTEROL SULFATE (2.5 MG/3ML) 0.083% IN NEBU: 2.5000 mg | INHALATION_SOLUTION | Freq: Four times a day (QID) | RESPIRATORY_TRACT | Status: DC | PRN

## 2019-01-28 MED ORDER — ROCURONIUM BROMIDE 10 MG/ML (PF) SYRINGE
PREFILLED_SYRINGE | INTRAVENOUS | Status: AC
  Filled 2019-01-28: qty 10

## 2019-01-28 MED ORDER — ONDANSETRON HCL 4 MG/2ML IJ SOLN: 4.0000 mg | Freq: Four times a day (QID) | INTRAMUSCULAR | Status: DC | PRN

## 2019-01-28 MED ORDER — CO-ENZYME Q-10 50 MG PO CAPS: 200.0000 mg | ORAL_CAPSULE | Freq: Every day | ORAL | Status: DC

## 2019-01-28 MED ORDER — BISACODYL 10 MG RE SUPP: 10.0000 mg | Freq: Every day | RECTAL | Status: DC | PRN

## 2019-01-28 MED ORDER — ASPIRIN EC 81 MG PO TBEC
81.0000 mg | DELAYED_RELEASE_TABLET | Freq: Every day | ORAL | Status: DC
  Administered 2019-01-28 – 2019-01-31 (×4): 81 mg via ORAL
  Filled 2019-01-28 (×4): qty 1

## 2019-01-28 MED ORDER — MIDAZOLAM HCL 5 MG/5ML IJ SOLN
INTRAMUSCULAR | Status: DC | PRN
  Administered 2019-01-28: 2 mg via INTRAVENOUS

## 2019-01-28 MED ORDER — PANTOPRAZOLE SODIUM 40 MG PO TBEC
40.0000 mg | DELAYED_RELEASE_TABLET | Freq: Every day | ORAL | Status: DC | PRN
  Administered 2019-01-28: 40 mg via ORAL

## 2019-01-28 MED ORDER — ONDANSETRON HCL 4 MG/2ML IJ SOLN
INTRAMUSCULAR | Status: AC
  Filled 2019-01-28: qty 2

## 2019-01-28 MED ORDER — LIDOCAINE-EPINEPHRINE 1 %-1:100000 IJ SOLN
INTRAMUSCULAR | Status: DC | PRN
  Administered 2019-01-28: 5 mL

## 2019-01-28 MED ORDER — ONDANSETRON HCL 4 MG PO TABS
4.0000 mg | ORAL_TABLET | Freq: Four times a day (QID) | ORAL | Status: DC | PRN
  Administered 2019-01-28: 4 mg via ORAL
  Filled 2019-01-28: qty 1

## 2019-01-28 MED ORDER — OXYCODONE HCL 5 MG PO TABS
10.0000 mg | ORAL_TABLET | ORAL | Status: DC | PRN
  Administered 2019-01-28 – 2019-01-29 (×6): 10 mg via ORAL
  Filled 2019-01-28 (×6): qty 2

## 2019-01-28 MED ORDER — BUPIVACAINE HCL (PF) 0.5 % IJ SOLN
INTRAMUSCULAR | Status: DC | PRN
  Administered 2019-01-28: 5 mL

## 2019-01-28 MED ORDER — RANOLAZINE ER 500 MG PO TB12
1000.0000 mg | ORAL_TABLET | Freq: Two times a day (BID) | ORAL | Status: DC
  Administered 2019-01-28 – 2019-01-31 (×6): 1000 mg via ORAL
  Filled 2019-01-28 (×7): qty 2

## 2019-01-28 MED ORDER — THROMBIN 5000 UNITS EX SOLR
OROMUCOSAL | Status: DC | PRN
  Administered 2019-01-28: 09:00:00 via TOPICAL

## 2019-01-28 MED ORDER — BUPIVACAINE LIPOSOME 1.3 % IJ SUSP
20.0000 mL | INTRAMUSCULAR | Status: AC
  Administered 2019-01-28: 20 mL
  Filled 2019-01-28: qty 20

## 2019-01-28 MED ORDER — BUPIVACAINE HCL (PF) 0.5 % IJ SOLN
INTRAMUSCULAR | Status: AC
  Filled 2019-01-28: qty 30

## 2019-01-28 MED ORDER — MIDAZOLAM HCL 2 MG/2ML IJ SOLN
INTRAMUSCULAR | Status: AC
  Filled 2019-01-28: qty 2

## 2019-01-28 MED ORDER — FENTANYL CITRATE (PF) 250 MCG/5ML IJ SOLN
INTRAMUSCULAR | Status: AC
  Filled 2019-01-28: qty 5

## 2019-01-28 MED ORDER — LIDOCAINE 5 % EX PTCH
2.0000 | MEDICATED_PATCH | Freq: Every day | CUTANEOUS | Status: DC | PRN
  Administered 2019-01-29: 1 via TRANSDERMAL
  Filled 2019-01-28 (×2): qty 3

## 2019-01-28 MED ORDER — PANTOPRAZOLE SODIUM 40 MG PO TBEC
40.0000 mg | DELAYED_RELEASE_TABLET | Freq: Every day | ORAL | Status: DC
  Administered 2019-01-29 – 2019-01-30 (×2): 40 mg via ORAL
  Filled 2019-01-28 (×3): qty 1

## 2019-01-28 MED ORDER — SODIUM CHLORIDE 0.9% FLUSH: 3.0000 mL | INTRAVENOUS | Status: DC | PRN

## 2019-01-28 MED ORDER — METHOCARBAMOL 1000 MG/10ML IJ SOLN: 500.0000 mg | Freq: Four times a day (QID) | INTRAVENOUS | Status: DC | PRN

## 2019-01-28 MED ORDER — KCL IN DEXTROSE-NACL 20-5-0.45 MEQ/L-%-% IV SOLN
INTRAVENOUS | Status: DC
  Administered 2019-01-30 (×2): via INTRAVENOUS
  Filled 2019-01-28 (×2): qty 1000

## 2019-01-28 MED ORDER — HYDROCODONE-ACETAMINOPHEN 5-325 MG PO TABS: 2.0000 | ORAL_TABLET | ORAL | Status: DC | PRN

## 2019-01-28 MED ORDER — FUROSEMIDE 40 MG PO TABS: 40.0000 mg | ORAL_TABLET | Freq: Every day | ORAL | Status: DC | PRN

## 2019-01-28 MED ORDER — ZOLPIDEM TARTRATE 5 MG PO TABS
5.0000 mg | ORAL_TABLET | Freq: Every evening | ORAL | Status: DC | PRN
  Administered 2019-01-29: 5 mg via ORAL
  Filled 2019-01-28: qty 1

## 2019-01-28 MED ORDER — ACETAMINOPHEN 500 MG PO TABS: 1000.0000 mg | ORAL_TABLET | Freq: Four times a day (QID) | ORAL | Status: DC | PRN

## 2019-01-28 MED ORDER — FENTANYL CITRATE (PF) 100 MCG/2ML IJ SOLN
25.0000 ug | INTRAMUSCULAR | Status: DC | PRN
  Administered 2019-01-28: 50 ug via INTRAVENOUS

## 2019-01-28 MED ORDER — ISOSORBIDE MONONITRATE ER 60 MG PO TB24
90.0000 mg | ORAL_TABLET | Freq: Every day | ORAL | Status: DC
  Administered 2019-01-29: 90 mg via ORAL
  Filled 2019-01-28 (×2): qty 1

## 2019-01-28 MED ORDER — VITAMIN E 180 MG (400 UNIT) PO CAPS
400.0000 [IU] | ORAL_CAPSULE | Freq: Every day | ORAL | Status: DC
  Administered 2019-01-29 – 2019-01-30 (×2): 400 [IU] via ORAL
  Filled 2019-01-28 (×3): qty 1

## 2019-01-28 MED ORDER — TIZANIDINE HCL 4 MG PO TABS
4.0000 mg | ORAL_TABLET | Freq: Three times a day (TID) | ORAL | Status: DC | PRN
  Administered 2019-01-29: 4 mg via ORAL
  Filled 2019-01-28 (×2): qty 1

## 2019-01-28 MED ORDER — LACTATED RINGERS IV SOLN
INTRAVENOUS | Status: DC | PRN
  Administered 2019-01-28: 07:00:00 via INTRAVENOUS

## 2019-01-28 MED ORDER — INSULIN ASPART 100 UNIT/ML ~~LOC~~ SOLN
0.0000 [IU] | Freq: Three times a day (TID) | SUBCUTANEOUS | Status: DC
  Administered 2019-01-28: 2 [IU] via SUBCUTANEOUS

## 2019-01-28 MED ORDER — PANTOPRAZOLE SODIUM 40 MG IV SOLR: 40.0000 mg | Freq: Every day | INTRAVENOUS | Status: DC

## 2019-01-28 MED ORDER — ONDANSETRON HCL 4 MG/2ML IJ SOLN: 4.0000 mg | Freq: Once | INTRAMUSCULAR | Status: DC | PRN

## 2019-01-28 MED ORDER — DICLOFENAC SODIUM 1 % EX GEL: 2.0000 g | Freq: Four times a day (QID) | CUTANEOUS | Status: DC | PRN

## 2019-01-28 MED ORDER — ACETAMINOPHEN 500 MG PO TABS
ORAL_TABLET | ORAL | Status: AC
  Filled 2019-01-28: qty 2

## 2019-01-28 MED ORDER — ACETAMINOPHEN 650 MG RE SUPP: 650.0000 mg | RECTAL | Status: DC | PRN

## 2019-01-28 MED ORDER — SODIUM CHLORIDE 0.9 % IV SOLN
250.0000 mL | INTRAVENOUS | Status: DC
  Administered 2019-01-28: 250 mL via INTRAVENOUS

## 2019-01-28 MED ORDER — PHENYLEPHRINE HCL-NACL 10-0.9 MG/250ML-% IV SOLN
INTRAVENOUS | Status: DC | PRN
  Administered 2019-01-28: 20 ug/min via INTRAVENOUS

## 2019-01-28 MED ORDER — CEFAZOLIN SODIUM-DEXTROSE 2-4 GM/100ML-% IV SOLN
INTRAVENOUS | Status: AC
  Filled 2019-01-28: qty 100

## 2019-01-28 MED ORDER — VITAMIN B-12 1000 MCG PO TABS
1000.0000 ug | ORAL_TABLET | Freq: Every day | ORAL | Status: DC
  Administered 2019-01-29 – 2019-01-30 (×2): 1000 ug via ORAL
  Filled 2019-01-28 (×3): qty 1

## 2019-01-28 MED ORDER — MORPHINE SULFATE (PF) 2 MG/ML IV SOLN
2.0000 mg | INTRAVENOUS | Status: DC | PRN
  Administered 2019-01-28: 2 mg via INTRAVENOUS
  Filled 2019-01-28: qty 1

## 2019-01-28 MED ORDER — THROMBIN 20000 UNITS EX SOLR
CUTANEOUS | Status: DC | PRN
  Administered 2019-01-28: 09:00:00 via TOPICAL

## 2019-01-28 MED ORDER — ALBUMIN HUMAN 5 % IV SOLN
INTRAVENOUS | Status: DC | PRN
  Administered 2019-01-28: 09:00:00 via INTRAVENOUS

## 2019-01-28 MED ORDER — LIDOCAINE 2% (20 MG/ML) 5 ML SYRINGE
INTRAMUSCULAR | Status: AC
  Filled 2019-01-28: qty 5

## 2019-01-28 MED ORDER — DEXAMETHASONE SODIUM PHOSPHATE 10 MG/ML IJ SOLN
INTRAMUSCULAR | Status: AC
  Filled 2019-01-28: qty 1

## 2019-01-28 MED ORDER — ACETAMINOPHEN 500 MG PO TABS
1000.0000 mg | ORAL_TABLET | Freq: Once | ORAL | Status: AC
  Administered 2019-01-28: 1000 mg via ORAL

## 2019-01-28 MED ORDER — LIDOCAINE-EPINEPHRINE 1 %-1:100000 IJ SOLN
INTRAMUSCULAR | Status: AC
  Filled 2019-01-28: qty 1

## 2019-01-28 MED ORDER — FLEET ENEMA 7-19 GM/118ML RE ENEM: 1.0000 | ENEMA | Freq: Once | RECTAL | Status: DC | PRN

## 2019-01-28 MED ORDER — FENTANYL CITRATE (PF) 100 MCG/2ML IJ SOLN
INTRAMUSCULAR | Status: DC | PRN
  Administered 2019-01-28: 50 ug via INTRAVENOUS
  Administered 2019-01-28: 100 ug via INTRAVENOUS
  Administered 2019-01-28: 50 ug via INTRAVENOUS

## 2019-01-28 MED ORDER — RAMIPRIL 5 MG PO CAPS
5.0000 mg | ORAL_CAPSULE | Freq: Every day | ORAL | Status: DC
  Administered 2019-01-28 – 2019-01-29 (×2): 5 mg via ORAL
  Filled 2019-01-28 (×3): qty 1

## 2019-01-28 MED ORDER — CEFAZOLIN SODIUM-DEXTROSE 2-4 GM/100ML-% IV SOLN
2.0000 g | Freq: Three times a day (TID) | INTRAVENOUS | Status: AC
  Administered 2019-01-28 (×2): 2 g via INTRAVENOUS
  Filled 2019-01-28 (×2): qty 100

## 2019-01-28 SURGICAL SUPPLY — 67 items
ADH SKN CLS APL DERMABOND .7 (GAUZE/BANDAGES/DRESSINGS) ×1
BASKET BONE COLLECTION (BASKET) ×2 IMPLANT
BUR MATCHSTICK NEURO 3.0 LAGG (BURR) ×2 IMPLANT
BUR PRECISION FLUTE 5.0 (BURR) ×2 IMPLANT
CAGE COROENT MP 8X9X23M-8 SPIN (Cage) ×2 IMPLANT
CANISTER SUCT 3000ML PPV (MISCELLANEOUS) ×2 IMPLANT
CARTRIDGE OIL MAESTRO DRILL (MISCELLANEOUS) ×1 IMPLANT
CONT SPEC 4OZ CLIKSEAL STRL BL (MISCELLANEOUS) ×2 IMPLANT
COVER BACK TABLE 60X90IN (DRAPES) ×2 IMPLANT
DERMABOND ADVANCED (GAUZE/BANDAGES/DRESSINGS) ×1
DERMABOND ADVANCED .7 DNX12 (GAUZE/BANDAGES/DRESSINGS) ×1 IMPLANT
DIFFUSER DRILL AIR PNEUMATIC (MISCELLANEOUS) ×2 IMPLANT
DRAPE C-ARM 42X72 X-RAY (DRAPES) ×2 IMPLANT
DRAPE C-ARMOR (DRAPES) ×2 IMPLANT
DRAPE LAPAROTOMY 100X72X124 (DRAPES) ×2 IMPLANT
DRAPE SURG 17X23 STRL (DRAPES) ×2 IMPLANT
DRSG OPSITE POSTOP 4X6 (GAUZE/BANDAGES/DRESSINGS) ×1 IMPLANT
DURAPREP 26ML APPLICATOR (WOUND CARE) ×2 IMPLANT
ELECT REM PT RETURN 9FT ADLT (ELECTROSURGICAL) ×2
ELECTRODE REM PT RTRN 9FT ADLT (ELECTROSURGICAL) ×1 IMPLANT
EVACUATOR 1/8 PVC DRAIN (DRAIN) IMPLANT
GAUZE 4X4 16PLY RFD (DISPOSABLE) IMPLANT
GAUZE SPONGE 4X4 12PLY STRL (GAUZE/BANDAGES/DRESSINGS) ×2 IMPLANT
GLOVE BIO SURGEON STRL SZ8 (GLOVE) ×4 IMPLANT
GLOVE BIOGEL PI IND STRL 7.0 (GLOVE) IMPLANT
GLOVE BIOGEL PI IND STRL 7.5 (GLOVE) IMPLANT
GLOVE BIOGEL PI IND STRL 8 (GLOVE) ×2 IMPLANT
GLOVE BIOGEL PI IND STRL 8.5 (GLOVE) ×2 IMPLANT
GLOVE BIOGEL PI INDICATOR 7.0 (GLOVE) ×4
GLOVE BIOGEL PI INDICATOR 7.5 (GLOVE) ×3
GLOVE BIOGEL PI INDICATOR 8 (GLOVE) ×2
GLOVE BIOGEL PI INDICATOR 8.5 (GLOVE) ×2
GLOVE ECLIPSE 8.0 STRL XLNG CF (GLOVE) ×4 IMPLANT
GLOVE SS N UNI LF 6.5 STRL (GLOVE) ×4 IMPLANT
GLOVE SS N UNI LF 7.0 STRL (GLOVE) ×2 IMPLANT
GOWN STRL REUS W/ TWL LRG LVL3 (GOWN DISPOSABLE) IMPLANT
GOWN STRL REUS W/ TWL XL LVL3 (GOWN DISPOSABLE) ×2 IMPLANT
GOWN STRL REUS W/TWL 2XL LVL3 (GOWN DISPOSABLE) ×4 IMPLANT
GOWN STRL REUS W/TWL LRG LVL3 (GOWN DISPOSABLE) ×2
GOWN STRL REUS W/TWL XL LVL3 (GOWN DISPOSABLE) ×4
HEMOSTAT POWDER KIT SURGIFOAM (HEMOSTASIS) ×2 IMPLANT
KIT BASIN OR (CUSTOM PROCEDURE TRAY) ×2 IMPLANT
KIT INFUSE XX SMALL 0.7CC (Orthopedic Implant) ×1 IMPLANT
KIT POSITION SURG JACKSON T1 (MISCELLANEOUS) ×2 IMPLANT
KIT TURNOVER KIT B (KITS) ×2 IMPLANT
MILL MEDIUM DISP (BLADE) ×1 IMPLANT
NDL HYPO 25X1 1.5 SAFETY (NEEDLE) ×1 IMPLANT
NDL SPNL 18GX3.5 QUINCKE PK (NEEDLE) IMPLANT
NEEDLE HYPO 25X1 1.5 SAFETY (NEEDLE) ×2 IMPLANT
NEEDLE SPNL 18GX3.5 QUINCKE PK (NEEDLE) IMPLANT
NS IRRIG 1000ML POUR BTL (IV SOLUTION) ×2 IMPLANT
OIL CARTRIDGE MAESTRO DRILL (MISCELLANEOUS) ×2
PACK LAMINECTOMY NEURO (CUSTOM PROCEDURE TRAY) ×2 IMPLANT
ROD RELIN-O LORD 5.5X65MM (Rod) ×2 IMPLANT
ROD RELINE O-H CON M 5.0/6.0MM (Rod) ×2 IMPLANT
SCREW LOCK RELINE 5.5 TULIP (Screw) ×4 IMPLANT
SCREW RELINE-O POLY 6.5X45 (Screw) ×2 IMPLANT
SPONGE SURGIFOAM ABS GEL 100 (HEMOSTASIS) ×1 IMPLANT
STAPLER SKIN PROX WIDE 3.9 (STAPLE) IMPLANT
SUT VIC AB 1 CT1 18XBRD ANBCTR (SUTURE) ×2 IMPLANT
SUT VIC AB 1 CT1 8-18 (SUTURE) ×2
SUT VIC AB 2-0 CT1 18 (SUTURE) ×3 IMPLANT
SUT VIC AB 3-0 SH 8-18 (SUTURE) ×4 IMPLANT
TOWEL GREEN STERILE (TOWEL DISPOSABLE) ×2 IMPLANT
TOWEL GREEN STERILE FF (TOWEL DISPOSABLE) ×2 IMPLANT
TRAY FOLEY MTR SLVR 16FR STAT (SET/KITS/TRAYS/PACK) ×2 IMPLANT
WATER STERILE IRR 1000ML POUR (IV SOLUTION) ×2 IMPLANT

## 2019-01-28 NOTE — Transfer of Care (Signed)
Immediate Anesthesia Transfer of Care Note  Patient: Tamara Nunez  Procedure(s) Performed: REDO LUMBAR TWO-THREE DECOMPRESSION AND POSTERIOR LUMBAR INTERBODY FUSION (N/A Spine Lumbar)  Patient Location: PACU  Anesthesia Type:General  Level of Consciousness: awake, alert , oriented and patient cooperative  Airway & Oxygen Therapy: Patient Spontanous Breathing and Patient connected to nasal cannula oxygen  Post-op Assessment: Report given to RN and Post -op Vital signs reviewed and stable  Post vital signs: Reviewed and stable  Last Vitals:  Vitals Value Taken Time  BP 134/75 01/28/19 1053  Temp 36.8 C 01/28/19 1053  Pulse 80 01/28/19 1103  Resp 17 01/28/19 1103  SpO2 100 % 01/28/19 1103  Vitals shown include unvalidated device data.  Last Pain:  Vitals:   01/28/19 0610  PainSc: 9       Patients Stated Pain Goal: 3 (81/82/99 3716)  Complications: No apparent anesthesia complications

## 2019-01-28 NOTE — Interval H&P Note (Signed)
History and Physical Interval Note:  01/28/2019 7:23 AM  Tamara Nunez  has presented today for surgery, with the diagnosis of Spinal stenosis, Lumbar region with neurogenic claudication.  The various methods of treatment have been discussed with the patient and family. After consideration of risks, benefits and other options for treatment, the patient has consented to  Procedure(s) with comments: Lumbar 2-3 Redo Decompression and fusion (N/A) - Lumbar 2-3 Redo Decompression and fusion as a surgical intervention.  The patient's history has been reviewed, patient examined, no change in status, stable for surgery.  I have reviewed the patient's chart and labs.  Questions were answered to the patient's satisfaction.     [name]

## 2019-01-28 NOTE — Progress Notes (Signed)
PHARMACIST - PHYSICIAN ORDER COMMUNICATION  CONCERNING: P&T Medication Policy on Herbal Medications  DESCRIPTION:  This patient's order for:  Co enzyme Q  has been noted.  This product(s) is classified as an "herbal" or natural product. Due to a lack of definitive safety studies or FDA approval, nonstandard manufacturing practices, plus the potential risk of unknown drug-drug interactions while on inpatient medications, the Pharmacy and Therapeutics Committee does not permit the use of "herbal" or natural products of this type within [facility].   ACTION TAKEN: The pharmacy department is unable to verify this order at this time and your patient has been informed of this safety policy. Please reevaluate patient's clinical condition at discharge and address if the herbal or natural product(s) should be resumed at that time.   Nicole Cella, RPh Clinical Pharmacist 01/28/2019 12:21 PM

## 2019-01-28 NOTE — Op Note (Signed)
01/28/2019  10:54 AM  PATIENT:  Tamara Nunez  63 y.o. female  PRE-OPERATIVE DIAGNOSIS:  Spinal stenosis, Lumbar region with neurogenic claudication, spondylosis, lumbago, radiculopathy, pseudoarthrosis L 23 level  POST-OPERATIVE DIAGNOSIS:  Spinal stenosis, Lumbar region with neurogenic claudication, spondylosis, lumbago, radiculopathy, pseudoarthrosis L 23 level  PROCEDURE:  Procedure(s) with comments: Lumbar 2-3 Redo Decompression and fusion (N/A) - Lumbar 2-3 Redo Decompression and fusion with exploration of fusion, PLIF with PEEK cages, pedicle screw fixation, posterolateral arthrodesis  SURGEON:  Surgeon(s) and Role:    Erline Levine, MD - Primary    * Ashok Pall, MD  PHYSICIAN ASSISTANT:   ASSISTANTS: Poteat, RN   ANESTHESIA:   general  EBL:  250 mL   BLOOD ADMINISTERED:none  DRAINS: none   LOCAL MEDICATIONS USED:  MARCAINE    and LIDOCAINE   SPECIMEN:  No Specimen  DISPOSITION OF SPECIMEN:  N/A  COUNTS:  YES  TOURNIQUET:  * No tourniquets in log *  DICTATION: Patient is 63 year old woman with lumbar stenosis and previous fusion L L 2- S 1  levels with stenosis at  L 23 and pseudorathrosis with severe spinal stenosis and a long history of severe back and bilateral leg pain.  It was elected to take her to surgery for exploration of previous fusion with decompression and fusion from L 2 through L 3 levels.   Procedure: Patient was placed in a prone position on the [place] table after smooth and uncomplicated induction of general endotracheal anesthesia. After marking prior hardware placement with C arm, her low back was prepped and draped in usual sterile fashion with betadine scrub and DuraPrep. Area of incision was infiltrated with local lidocaine. Incision was made to the lumbodorsal fascia was incised and exposure was performed of the L 2 - L 3 spinous processes laminae facet joint and transverse processes. Previous hardware was exposed.  Dynesys screws and  polyethelene rods were exposed and removed.  The nylon cords had fractured and there was clearly a pseudoarthrosis at this level.  Connectors were placed between L 3 and L 4 screws bilaterally and the connections were torqued appropriately.  The bone screws at L 2 were removed and replaced with 6.5 x 45 mm screws. A total laminectomy of L 2  through L 3 levels was performed with disarticulation of the facet joints and thorough decompression was performed of both  L 2 , L 3   nerve roots along with the common dural tube. Decompression was greater than would be typical for PLIF. A thorough discectomy was performed with removal initially on the left and subsequently on the right with preparation of the endplates for grafting a trial spacer was placed this level and a thorough discectomy was performed on the right as well at L 23.  After trial sizing and utilization of sequential shavers, interspaces were packed with autograft, extra extra small BMP and 8 mm  X 23 x 8 degree lordotic PEEK cages with 10 cc of autograft in the intrerspace. The posterolateral region was extensively decorticated. Intraoperative fluoroscopy confirmed correct orientationin the AP and lateral plane. Final x-rays demonstrated well-positioned interbody grafts and pedicle screw fixation. A 64 mm lordotic rod was placed on the right and a 65 mm rod was placed on the left locked down in situ and the posterolateral region was packed with 20 cc bone graft on the left. The wound was irrigated. Fascia was closed with 1 Vicryl sutures skin edges were reapproximated 2 and 3-0 Vicryl sutures.  The wound was dressed with Dermabond and  an occlusive dressing the patient was extubated in the operating room and taken to recovery in stable satisfactory condition she tolerated traction well counts were correct at the end of the case.   PLAN OF CARE: Admit to inpatient   PATIENT DISPOSITION:  PACU - hemodynamically stable.   Delay start of Pharmacological  VTE agent (>24hrs) due to surgical blood loss or risk of bleeding: yes

## 2019-01-28 NOTE — Progress Notes (Signed)
Orthopedic Tech Progress Note Patient Details:  Tamara Nunez [birth year] [id number] Patient already has brace. Ortho Devices Type of Ortho Device: Soft collar Ortho Device/Splint Location: bedside Ortho Device/Splint Interventions: Ordered   Post Interventions Instructions Provided: Care of device   Braulio Bosch 01/28/2019, 12:57 PM

## 2019-01-28 NOTE — Brief Op Note (Signed)
01/28/2019  10:54 AM  PATIENT:  Tamara Nunez  63 y.o. female  PRE-OPERATIVE DIAGNOSIS:  Spinal stenosis, Lumbar region with neurogenic claudication, spondylosis, lumbago, radiculopathy, pseudoarthrosis L 23 level  POST-OPERATIVE DIAGNOSIS:  Spinal stenosis, Lumbar region with neurogenic claudication, spondylosis, lumbago, radiculopathy, pseudoarthrosis L 23 level  PROCEDURE:  Procedure(s) with comments: Lumbar 2-3 Redo Decompression and fusion (N/A) - Lumbar 2-3 Redo Decompression and fusion with exploration of fusion, PLIF with PEEK cages, pedicle screw fixation, posterolateral arthrodesis  SURGEON:  Surgeon(s) and Role:    Erline Levine, MD - Primary    * Ashok Pall, MD  PHYSICIAN ASSISTANT:   ASSISTANTS: Poteat, RN   ANESTHESIA:   general  EBL:  250 mL   BLOOD ADMINISTERED:none  DRAINS: none   LOCAL MEDICATIONS USED:  MARCAINE    and LIDOCAINE   SPECIMEN:  No Specimen  DISPOSITION OF SPECIMEN:  N/A  COUNTS:  YES  TOURNIQUET:  * No tourniquets in log *  DICTATION: Patient is 63 year old woman with lumbar stenosis and previous fusion L L 2- S 1  levels with stenosis at  L 23 and pseudorathrosis with severe spinal stenosis and a long history of severe back and bilateral leg pain.  It was elected to take her to surgery for exploration of previous fusion with decompression and fusion from L 2 through L 3 levels.   Procedure: Patient was placed in a prone position on the [place] table after smooth and uncomplicated induction of general endotracheal anesthesia. After marking prior hardware placement with C arm, her low back was prepped and draped in usual sterile fashion with betadine scrub and DuraPrep. Area of incision was infiltrated with local lidocaine. Incision was made to the lumbodorsal fascia was incised and exposure was performed of the L 2 - L 3 spinous processes laminae facet joint and transverse processes. Previous hardware was exposed.  Dynesys screws and  polyethelene rods were exposed and removed.  The nylon cords had fractured and there was clearly a pseudoarthrosis at this level.  Connectors were placed between L 3 and L 4 screws bilaterally and the connections were torqued appropriately.  The bone screws at L 2 were removed and replaced with 6.5 x 45 mm screws. A total laminectomy of L 2  through L 3 levels was performed with disarticulation of the facet joints and thorough decompression was performed of both  L 2 , L 3   nerve roots along with the common dural tube. Decompression was greater than would be typical for PLIF. A thorough discectomy was performed with removal initially on the left and subsequently on the right with preparation of the endplates for grafting a trial spacer was placed this level and a thorough discectomy was performed on the right as well at L 23.  After trial sizing and utilization of sequential shavers, interspaces were packed with autograft, extra extra small BMP and 8 mm  X 23 x 8 degree lordotic PEEK cages with 10 cc of autograft in the intrerspace. The posterolateral region was extensively decorticated. Intraoperative fluoroscopy confirmed correct orientationin the AP and lateral plane. Final x-rays demonstrated well-positioned interbody grafts and pedicle screw fixation. A 64 mm lordotic rod was placed on the right and a 65 mm rod was placed on the left locked down in situ and the posterolateral region was packed with 20 cc bone graft on the left. The wound was irrigated. Fascia was closed with 1 Vicryl sutures skin edges were reapproximated 2 and 3-0 Vicryl sutures.  The wound was dressed with Dermabond and  an occlusive dressing the patient was extubated in the operating room and taken to recovery in stable satisfactory condition she tolerated traction well counts were correct at the end of the case.   PLAN OF CARE: Admit to inpatient   PATIENT DISPOSITION:  PACU - hemodynamically stable.   Delay start of Pharmacological  VTE agent (>24hrs) due to surgical blood loss or risk of bleeding: yes 

## 2019-01-28 NOTE — Anesthesia Postprocedure Evaluation (Signed)
Anesthesia Post Note  Patient: Tamara Nunez  Procedure(s) Performed: REDO LUMBAR TWO-THREE DECOMPRESSION AND POSTERIOR LUMBAR INTERBODY FUSION (N/A Spine Lumbar)     Patient location during evaluation: PACU Anesthesia Type: General Level of consciousness: awake and alert Pain management: pain level controlled Vital Signs Assessment: post-procedure vital signs reviewed and stable Respiratory status: spontaneous breathing, nonlabored ventilation, respiratory function stable and patient connected to nasal cannula oxygen Cardiovascular status: blood pressure returned to baseline and stable Postop Assessment: no apparent nausea or vomiting Anesthetic complications: no    Last Vitals:  Vitals:   01/28/19 1208 01/28/19 1624  BP: (!) 128/55 140/61  Pulse: 78 80  Resp: 18 16  Temp: 36.7 C 37.1 C  SpO2:  100%    Last Pain:  Vitals:   01/28/19 1624  TempSrc: Oral  PainSc:                  Severiano Utsey COKER

## 2019-01-28 NOTE — Progress Notes (Signed)
Orthopedic Tech Progress Note Patient Details:  Tamara Nunez [birth year] [id number] Charged to wrong patient. Patient ID: Tamara Nunez, female   DOB: 07/06/1955, 63 y.o.   MRN: [id number]   Braulio Bosch 01/28/2019, 2:15 PM

## 2019-01-29 LAB — GLUCOSE, CAPILLARY
Glucose-Capillary: 106 mg/dL — ABNORMAL HIGH (ref 70–99)
Glucose-Capillary: 63 mg/dL — ABNORMAL LOW (ref 70–99)
Glucose-Capillary: 77 mg/dL (ref 70–99)
Glucose-Capillary: 81 mg/dL (ref 70–99)
Glucose-Capillary: 90 mg/dL (ref 70–99)

## 2019-01-29 NOTE — Evaluation (Signed)
Occupational Therapy Evaluation Patient Details Name: Tamara Nunez MRN: [id number] DOB: [birth year] Today's Date: 01/29/2019    History of Present Illness Pt is a 63 y/o female s/p L2-L3 redo decompression and PLIF. PMH including but not limited to DM, HTN, CAD s/p CABG in 2006, s/p back surgery (in 2003 per pt).   Clinical Impression   Patient is s/p s/p L2-L3 redo decompression and PLIF surgery resulting in the deficits listed below (see OT Problem List). Pt at this time was educated on how to complete LE dressing and bathing with AE due to pt decline to complete at this time. Pt recommended to have shower seat at home for showering due to unsteadiness with ambulation and self reports they have day they do not always feel good. Pt will have husband and 58 year old grandchild to assist when returning home but did not report they are able to be home 24/7.         Follow Up Recommendations  Home health OT;Supervision/Assistance - 24 hour    Equipment Recommendations  Tub/shower seat    Recommendations for Other Services       Precautions / Restrictions Precautions Precautions: Back;Fall Precaution Booklet Issued: Yes (comment) Precaution Comments: 33 precautions recall but cued to follow with LE ADLS Required Braces or Orthoses: Spinal Brace Spinal Brace: Thoracolumbosacral orthotic;Applied in sitting position Restrictions Weight Bearing Restrictions: No      Mobility Bed Mobility Overal bed mobility: Needs Assistance Bed Mobility: Rolling;Sidelying to Sit Rolling: Supervision Sidelying to sit: Supervision       General bed mobility comments: good log roll technique, supervision for safety, use of bed rails  Transfers Overall transfer level: Needs assistance Equipment used: Rolling walker (2 wheeled);None Transfers: Sit to/from Stand Sit to Stand: Min guard         General transfer comment: for safety, good technique utilized    Balance Overall balance  assessment: Needs assistance Sitting-balance support: Feet supported Sitting balance-Leahy Scale: Good     Standing balance support: During functional activity;No upper extremity supported Standing balance-Leahy Scale: Fair                             ADL either performed or assessed with clinical judgement   ADL Overall ADL's : Needs assistance/impaired Eating/Feeding: Independent;Sitting   Grooming: Wash/dry hands;Wash/dry face;Modified independent;Standing   Upper Body Bathing: Supervision/ safety;Cueing for safety;Cueing for sequencing;Sitting   Lower Body Bathing: Minimal assistance;Sit to/from stand   Upper Body Dressing : Set up;Sitting   Lower Body Dressing: Minimal assistance;Cueing for safety;Cueing for sequencing;Sit to/from stand   Toilet Transfer: Supervision/safety;Grab bars   Toileting- Architect and Hygiene: Supervision/safety;Sit to/from stand   Tub/ Shower Transfer: Min guard;Shower seat;Cueing for safety;Cueing for sequencing   Functional mobility during ADLs: Minimal assistance;Cueing for safety;Cueing for sequencing       Vision Baseline Vision/History: Wears glasses Patient Visual Report: No change from baseline Vision Assessment?: No apparent visual deficits     Perception Perception Perception Tested?: No   Praxis Praxis Praxis tested?: Not tested    Pertinent Vitals/Pain Pain Assessment: 0-10 Pain Score: 8  Faces Pain Scale: Hurts even more Pain Location: back Pain Descriptors / Indicators: Sore;Grimacing;Guarding Pain Intervention(s): Limited activity within patient's tolerance;Monitored during session;Repositioned     Hand Dominance Right   Extremity/Trunk Assessment Upper Extremity Assessment Upper Extremity Assessment: Generalized weakness(increase pain and limiting)   Lower Extremity Assessment Lower Extremity Assessment: Defer to PT evaluation  Cervical / Trunk Assessment Cervical / Trunk  Assessment: Other exceptions Cervical / Trunk Exceptions: s/p lumbar surgery   Communication Communication Communication: No difficulties   Cognition Arousal/Alertness: Awake/alert Behavior During Therapy: WFL for tasks assessed/performed Overall Cognitive Status: Within Functional Limits for tasks assessed                                     General Comments       Exercises     Shoulder Instructions      Home Living Family/patient expects to be discharged to:: Private residence Living Arrangements: Spouse/significant other Available Help at Discharge: Family;Available PRN/intermittently Type of Home: House Home Access: Stairs to enter [employer] of Steps: 2 Entrance Stairs-Rails: None Home Layout: One level     Bathroom Shower/Tub: Occupational psychologist: Handicapped height Bathroom Accessibility: Yes How Accessible: Accessible via walker Home Equipment: [place] - 4 wheels;Cane - single point          Prior Functioning/Environment Level of Independence: Needs assistance  Gait / Transfers Assistance Needed: ambulated with cane PRN ADL's / Homemaking Assistance Needed: required assistance for lower body bathing from husband on weekends (husband is gone during the week)            OT Problem List: Decreased strength;Decreased activity tolerance;Decreased safety awareness;Decreased knowledge of use of DME or AE;Increased edema;Decreased knowledge of precautions      OT Treatment/Interventions:      OT Goals(Current goals can be found in the care plan section) Acute Rehab OT Goals Patient Stated Goal: decrease pain, to feel better OT Goal Formulation: With patient Time For Goal Achievement: 02/05/19 Potential to Achieve Goals: Good  OT Frequency:     Barriers to D/C:            Co-evaluation              AM-PAC OT "6 Clicks" Daily Activity     Outcome Measure Help from another person eating meals?: None Help  from another person taking care of personal grooming?: None Help from another person toileting, which includes using toliet, bedpan, or urinal?: None Help from another person bathing (including washing, rinsing, drying)?: A Little Help from another person to put on and taking off regular upper body clothing?: None Help from another person to put on and taking off regular lower body clothing?: A Little 6 Click Score: 22   End of Session Equipment Utilized During Treatment: Gait belt;Back brace Nurse Communication: Mobility status  Activity Tolerance: Patient limited by pain;Patient limited by lethargy Patient left: in bed;with call bell/phone within reach  OT Visit Diagnosis: Unsteadiness on feet (R26.81);Muscle weakness (generalized) (M62.81);Pain Pain - part of body: (back)                Time: 0722-0737 OT Time Calculation (min): 15 min Charges:  OT General Charges $OT Visit: 1 Visit OT Evaluation $OT Eval Low Complexity: [place] OTR/L  Acute Rehab Services  [phone] office number [phone] pager number   Joeseph Amor 01/29/2019, 7:48 AM

## 2019-01-29 NOTE — Evaluation (Addendum)
Physical Therapy Evaluation Patient Details Name: Tamara Nunez MRN: [id number] DOB: Mar 08, 1956 Today's Date: 01/29/2019   History of Present Illness  Pt is a 63 y/o female s/p L2-L3 redo decompression and PLIF. PMH including but not limited to DM, HTN, CAD s/p CABG in 2006, s/p back surgery (in 2003 per pt).    Clinical Impression  Pt presented supine in bed with HOB elevated, awake and willing to participate in therapy session. Prior to admission, pt reported that she ambulated with use of a cane PRN and required assistance from husband for some ADLs (mostly lower body bathing). Pt lives with her spouse in a single level home with two steps to enter. She will only have intermittent assistance from spouse (on weekends) and 116 y/o grandson when her husband is gone. At the time of evaluation, pt greatly limited secondary to pain and overall not feeling well. She was able to perform bed mobility with supervision, transfers with min guard and ambulated a short distance in the hallway with min guard to min A for safety/stability. Pt also frequently reaching for support surfaces during ambulation despite not wanting to use RW. Pt denies any SOB or chest pain throughout, mild dizziness during ambulation that subsided with standing rest break. PT provided pt education re: back precautions, car transfers and generalized walking program for pt to initiate upon d/c home. Pt would continue to benefit from skilled physical therapy services at this time while admitted and after d/c to address the below listed limitations in order to improve overall safety and independence with functional mobility.     Follow Up Recommendations Home health PT;Supervision for mobility/OOB    Equipment Recommendations  None recommended by PT    Recommendations for Other Services       Precautions / Restrictions Precautions Precautions: Back;Fall Precaution Booklet Issued: Yes (comment) Precaution Comments: reviewed 3/3  back precautions and log roll technique with pt throughout session Required Braces or Orthoses: Spinal Brace Spinal Brace: Thoracolumbosacral orthotic;Applied in sitting position Restrictions Weight Bearing Restrictions: No      Mobility  Bed Mobility Overal bed mobility: Needs Assistance Bed Mobility: Rolling;Sidelying to Sit Rolling: Supervision Sidelying to sit: Supervision       General bed mobility comments: good log roll technique, supervision for safety, use of bed rails  Transfers Overall transfer level: Needs assistance Equipment used: Rolling walker (2 wheeled);None Transfers: Sit to/from Stand Sit to Stand: Min guard         General transfer comment: for safety, good technique utilized  Ambulation/Gait Ambulation/Gait assistance: Min guard;Min Chemical engineerassist Gait Distance (Feet): 50 Feet Assistive device: None;1 person hand held assist Gait Pattern/deviations: Step-through pattern;Decreased step length - right;Decreased step length - left;Decreased stride length;Trunk flexed Gait velocity: decreased   General Gait Details: pt with mild instability but no overt LOB; despite refusing use of RW, pt frequently reaching for external support surfaces with R UE in hallway or use of 1HHA from therapist. pt required one standing rest break secondary to "not feeling well" and mild dizziness  Stairs            Wheelchair Mobility    Modified Rankin (Stroke Patients Only)       Balance Overall balance assessment: Needs assistance Sitting-balance support: Feet supported Sitting balance-Leahy Scale: Good     Standing balance support: During functional activity;No upper extremity supported Standing balance-Leahy Scale: Fair  Pertinent Vitals/Pain Pain Assessment: Faces Faces Pain Scale: Hurts even more Pain Location: back Pain Descriptors / Indicators: Sore;Grimacing;Guarding Pain Intervention(s): Monitored during  session;Repositioned    Home Living Family/patient expects to be discharged to:: Private residence Living Arrangements: Spouse/significant other Available Help at Discharge: Family;Available PRN/intermittently Type of Home: House Home Access: Stairs to enter Entrance Stairs-Rails: None Entrance Stairs-Number of Steps: 2 Home Layout: One level Home Equipment: Walker - 4 wheels;Cane - single point      Prior Function Level of Independence: Needs assistance   Gait / Transfers Assistance Needed: ambulated with cane PRN  ADL's / Homemaking Assistance Needed: required assistance for lower body bathing from husband on weekends (husband is gone during the week)        Hand Dominance   Dominant Hand: Right    Extremity/Trunk Assessment   Upper Extremity Assessment Upper Extremity Assessment: Defer to OT evaluation;Overall [facility] for tasks assessed    Lower Extremity Assessment Lower Extremity Assessment: Generalized weakness    Cervical / Trunk Assessment Cervical / Trunk Assessment: Other exceptions Cervical / Trunk Exceptions: s/p lumbar surgery  Communication   Communication: No difficulties  Cognition Arousal/Alertness: Awake/alert Behavior During Therapy: WFL for tasks assessed/performed Overall Cognitive Status: Within Functional Limits for tasks assessed                                        General Comments      Exercises     Assessment/Plan    PT Assessment Patient needs continued PT services  PT Problem List Decreased strength;Decreased range of motion;Decreased activity tolerance;Decreased balance;Decreased mobility;Decreased coordination;Decreased knowledge of use of DME;Decreased safety awareness;Decreased knowledge of precautions;Pain       PT Treatment Interventions DME instruction;Gait training;Stair training;Functional mobility training;Therapeutic exercise;Balance training;Therapeutic activities;Neuromuscular  re-education;Patient/family education    PT Goals (Current goals can be found in the Care Plan section)  Acute Rehab PT Goals Patient Stated Goal: decrease pain, to feel better PT Goal Formulation: With patient Time For Goal Achievement: 02/12/19 Potential to Achieve Goals: Good    Frequency Min 5X/week   Barriers to discharge        Co-evaluation               AM-PAC PT "6 Clicks" Mobility  Outcome Measure Help needed turning from your back to your side while in a flat bed without using bedrails?: None Help needed moving from lying on your back to sitting on the side of a flat bed without using bedrails?: None Help needed moving to and from a bed to a chair (including a wheelchair)?: None Help needed standing up from a chair using your arms (e.g., wheelchair or bedside chair)?: None Help needed to walk in hospital room?: A Little Help needed climbing 3-5 steps with a railing? : A Lot 6 Click Score: 21    End of Session Equipment Utilized During Treatment: Gait belt;Back brace Activity Tolerance: Patient limited by pain Patient left: with call bell/phone within reach;Other (comment)(on toilet in bathroom with OT present) Nurse Communication: Mobility status PT Visit Diagnosis: Other abnormalities of gait and mobility (R26.89);Pain Pain - part of body: (back)    Time: 0086-7619 PT Time Calculation (min) (ACUTE ONLY): 21 min   Charges:   PT Evaluation $PT Eval Moderate Complexity: 1 Mod          Ginette Pitman, PT, DPT  Acute Rehabilitation Services Pager [phone] Office  [place] 01/29/2019, 7:41 AM

## 2019-01-29 NOTE — Progress Notes (Signed)
Postop day 1.  Patient states that she does not feel well.  She complains of generalized pain without any specific source.  She states she feels some achiness into her anterior thighs but does not have any true radicular pain down her legs.  She is been able to stand and participate with therapy.  She is voiding reasonably well.  She is afebrile.  She is awake and alert.  She is oriented and reasonably appropriate.  Her motor and sensory function are intact.  Her abdomen is soft.  Chest is benign.  Extremities are free from injury or deformity.  Patient progressing slowly following lumbar decompression and fusion surgery.  Continue efforts at mobilization and supportive care.  Patient not ready for discharge today.  Will transfer to floor.

## 2019-01-30 LAB — GLUCOSE, CAPILLARY
Glucose-Capillary: 82 mg/dL (ref 70–99)
Glucose-Capillary: 89 mg/dL (ref 70–99)
Glucose-Capillary: 95 mg/dL (ref 70–99)
Glucose-Capillary: 128 mg/dL — ABNORMAL HIGH (ref 70–99)

## 2019-01-30 MED ORDER — SODIUM CHLORIDE 0.9 % IV BOLUS
500.0000 mL | Freq: Once | INTRAVENOUS | Status: AC
  Administered 2019-01-30: 500 mL via INTRAVENOUS

## 2019-01-30 NOTE — Progress Notes (Signed)
Pt had three episodes of loose watery foul smelling stools.

## 2019-01-30 NOTE — Progress Notes (Signed)
Pt received 583ml bolus per MD's order. SBP rechecked. SBP=91/61, HR 90, MAP 72

## 2019-01-30 NOTE — Progress Notes (Signed)
Patient ID: Tamara Nunez, female   DOB: [birth year], 63 y.o.   MRN: [id number] BP (!) 92/53 (BP Location: Left Arm)   Pulse 87   Temp 100 F (37.8 C) (Oral)   Resp 20   Ht 5\' 4"  (1.626 m)   Wt 76 kg   SpO2 95%   BMI 28.75 kg/m  Alert appears ill Not feeling well Wound is clean dry no signs of infection Diarrhea, will monitor

## 2019-01-30 NOTE — Progress Notes (Signed)
Notified MD with regards to patient's having episodes of three large loose watery foul smelling stools. MD order a one time Cdiff , stool to be  Collected, tried putting in the order but was not able to due to the system blocking. Will pass it on to AM oncoming nurse.

## 2019-01-30 NOTE — Progress Notes (Signed)
Pt has an episode of hypotention, SBP 74/32, MAP=46.Rechecked=76/36, MAP=44,. MD notified. Bolus of normal saline 500 order given.

## 2019-01-30 NOTE — Progress Notes (Signed)
PT Cancellation Note  Patient Details Name: Tamara Nunez MRN: [id number] DOB: [birth year]   Cancelled Treatment:    Reason Eval/Treat Not Completed: Patient not medically ready Pt not feeling well and had a rough night; also noted to have soft BP so holding per RN. Will follow.   Tamara Nunez 01/30/2019, 9:25 AM Tamara Nunez, PT, DPT Acute Rehabilitation Services Pager [phone] Office [phone]

## 2019-01-31 LAB — GLUCOSE, CAPILLARY
Glucose-Capillary: 104 mg/dL — ABNORMAL HIGH (ref 70–99)
Glucose-Capillary: 103 mg/dL — ABNORMAL HIGH (ref 70–99)
Glucose-Capillary: 128 mg/dL — ABNORMAL HIGH (ref 70–99)

## 2019-01-31 MED ORDER — HYDROCODONE-ACETAMINOPHEN 5-325 MG PO TABS: 1.0000 | ORAL_TABLET | ORAL | 0 refills | Status: DC | PRN

## 2019-01-31 MED ORDER — METHOCARBAMOL 500 MG PO TABS: 500.0000 mg | ORAL_TABLET | Freq: Three times a day (TID) | ORAL | 1 refills | Status: DC | PRN

## 2019-01-31 NOTE — TOC Transition Note (Addendum)
Transition of Care [facility]) - CM/SW Discharge Note Marvetta Gibbons RN,BSN Transitions of Care Unit 4NP (non trauma) - RN Case Manager [phone]   Patient Details  Name: Tamara Nunez MRN: [id number] Date of Birth: 19-Nov-1955  Transition of Care [facility]) CM/SW Contact:  Dawayne Patricia, RN Phone Number: 01/31/2019, 2:26 PM   Clinical Narrative:    Pt stable for transition home, per PT pt still could benefit from HHPT/OT - have left msg for Dr. Vertell Limber for HHP/OT orders- CM spoke with pt and spouse at bedside for transition of care needs- per pt she has a rollator at home and declines and other DME at this time. List provided for [facility] agencies in [place] Per CMS guidelines from medicare.gov website with star ratings (copy placed in shadow chart)- pt would like to try [employer] [facility] or [facility] and if they can not accept then gave permission for CM to check with any of the others based on highest star ratings first to the lowest last. Calls made to both agencies who can not accept referral due to being out of network with [facility] insurance.   [facility]- unable to accept- out of network [facility]- unable to accept- out of network [employer] [facility]- unable to accept- out of network [employer] [facility]- pending return call [facility]- in network- no availability until middle to late next week [facility]- in network-- Accepted referral  [place]- maybe in network depending on policy  Call made to pt post discharge to discuss difficulty in finding [facility]- and to let her know first choices were out of network- discussed with pt agencies that were in-network- and per pt she wants to check into [facility] first and [facility] second- Call made back to [facility] and spoke with [facility]- who confirmed that they do accept [facility] commercial plans- they are able to accept referral - faxed orders and paperwork to [facility] at [phone] via epic.    Final next level of care: [place] Barriers to Discharge: No Barriers Identified   Patient Goals and CMS Choice Patient states their goals for this hospitalization and ongoing recovery are:: to get  back on my feet CMS Medicare.gov Compare Post Acute Care list provided to:: Patient Choice offered to / list presented to : Patient, Spouse  Discharge Placement             Home with [facility] (pending)          Discharge Plan and Services   Discharge Planning Services: CM Consult Post Acute Care Choice: Home Health          DME Arranged: N/A DME Agency: NA       HH Arranged: PT, OT          Social Determinants of Health (SDOH) Interventions     Readmission Risk Interventions Readmission Risk Prevention Plan 01/31/2019  Post Dischage Appt Complete  Medication Screening Complete  Transportation Screening Complete  Some recent data might be hidden

## 2019-01-31 NOTE — Plan of Care (Signed)

## 2019-01-31 NOTE — Progress Notes (Signed)
Subjective: Patient reports feeling better today  Objective: Vital signs in last 24 hours: Temp:  [98.2 F (36.8 C)-99.8 F (37.7 C)] 99.4 F (37.4 C) (11/23 0700) Pulse Rate:  [80-96] 96 (11/23 0700) Resp:  [18] 18 (11/23 0700) BP: (105-129)/(51-84) 106/84 (11/23 0700) SpO2:  [97 %-100 %] 97 % (11/23 0700)  Intake/Output from previous day: 11/22 0701 - 11/23 0700 In: 240 [P.O.:240] Out: 1201 [Urine:1200; Stool:1] Intake/Output this shift: No intake/output data recorded.  Physical Exam: Up in chair in brace.  Leg pain improved.  Lab Results: No results for input(s): WBC, HGB, HCT, PLT in the last 72 hours. BMET No results for input(s): NA, K, CL, CO2, GLUCOSE, BUN, CREATININE, CALCIUM in the last 72 hours.  Studies/Results: No results found.  Assessment/Plan: Patient is feeling better today.  Discharge home after working with PT.    LOS: 3 days    [name], MD 01/31/2019, 9:39 AM

## 2019-01-31 NOTE — Discharge Summary (Signed)
Physician Discharge Summary  Patient ID: Tamara Nunez MRN: [id number] DOB/AGE: 1955-10-28 63 y.o.  Admit date: 01/28/2019 Discharge date: 01/31/2019  Admission Diagnoses:Degenerative lumbar stenosis, herniated lumbar disc, lumbago, radiculopathy, pseudoarthrosis  Discharge Diagnoses: Same Active Problems:   Degenerative lumbar spinal stenosis   Discharged Condition: good  Hospital Course: Patient was taken to OR for exploration of fusion with revision of fusion L 23 level.  She did well with this surgery, was slow to mobilize and had diarrhea on POD 2.  This was resolved on AM of POD 3 and she was discharged home.  Consults: None  Significant Diagnostic Studies: None  Treatments: surgery: Patient was taken to OR for exploration of fusion with revision of fusion L 23 level.  Discharge Exam: Blood pressure 106/84, pulse 96, temperature 99.4 F (37.4 C), temperature source Oral, resp. rate 18, height 5\' 4"  (1.626 m), weight 76 kg, SpO2 97 %. Neurologic: Alert and oriented X 3, normal strength and tone. Normal symmetric reflexes. Normal coordination and gait Wound:CDI  Disposition: Home   Discharge Instructions    Diet - low sodium heart healthy   Complete by: As directed    Increase activity slowly   Complete by: As directed      Allergies as of 01/31/2019      Reactions   Codeine Hives, Shortness Of Breath      Other Anaphylaxis   Almonds    Ciprofloxacin Itching   Valsartan Rash   Adhesive [tape] Itching   "clear tape" used after surgery."took all her skin off".  Paper take is ok   Sulfa Antibiotics Nausea Only      Medication List    STOP taking these medications   celecoxib 200 MG capsule Commonly known as: CELEBREX   clopidogrel 75 MG tablet Commonly known as: PLAVIX     TAKE these medications   acetaminophen 500 MG tablet Commonly known as: TYLENOL Take 1,000 mg by mouth every 6 (six) hours as needed for moderate pain or headache.   albuterol  108 (90 Base) MCG/ACT inhaler Commonly known as: VENTOLIN HFA Inhale 2 puffs into the lungs every 6 (six) hours as needed for wheezing.   aspirin EC 81 MG tablet Take 81 mg by mouth daily.   cetirizine 10 MG tablet Commonly known as: ZYRTEC Take 10 mg by mouth daily.   co-enzyme Q-10 50 MG capsule Take 200 mg by mouth daily.   diclofenac sodium 1 % Gel Commonly known as: VOLTAREN Apply 2 g topically 4 (four) times daily as needed (pain).   fluticasone 50 MCG/ACT nasal spray Commonly known as: FLONASE Place 1 spray into both nostrils daily.   furosemide 40 MG tablet Commonly known as: LASIX TAKE 1 TABLET BY MOUTH  DAILY AS NEEDED FOR FLUID  OR EDEMA. What changed: See the new instructions.   gabapentin 600 MG tablet Commonly known as: NEURONTIN Take 600 mg by mouth 3 (three) times daily.   HYDROcodone-acetaminophen 5-325 MG tablet Commonly known as: NORCO/VICODIN Take 1-2 tablets by mouth every 4 (four) hours as needed for moderate pain ((score 7 to 10)).   isosorbide mononitrate 60 MG 24 hr tablet Commonly known as: IMDUR TAKE 1 AND 1/2 TABLETS BY  MOUTH TWO TIMES DAILY What changed: See the new instructions.   lidocaine 5 % Commonly known as: LIDODERM Place 2-3 patches onto the skin daily as needed (pain). Remove & Discard patch within 12 hours or as directed by MD   methocarbamol 500 MG tablet Commonly known  as: ROBAXIN Take 1 tablet (500 mg total) by mouth every 8 (eight) hours as needed for muscle spasms.   metoprolol tartrate 25 MG tablet Commonly known as: LOPRESSOR Take 25 mg by mouth 2 (two) times daily.   nitroGLYCERIN 0.4 MG/SPRAY spray Commonly known as: NITROLINGUAL Place 1 spray under the tongue every 5 (five) minutes as needed for chest pain.   OneTouch Ultra test strip Generic drug: glucose blood Test BG qid   pantoprazole 40 MG tablet Commonly known as: PROTONIX Take 40 mg by mouth daily as needed (acid reflux).   polyethylene glycol  powder 17 GM/SCOOP powder Commonly known as: GLYCOLAX/MIRALAX Take 17 g by mouth daily.   ramipril 5 MG capsule Commonly known as: ALTACE Take 5 mg by mouth at bedtime.   ranolazine 1000 MG SR tablet Commonly known as: Ranexa Take 1 tablet (1,000 mg total) by mouth 2 (two) times daily.   simvastatin 20 MG tablet Commonly known as: ZOCOR Take 20 mg by mouth at bedtime.   tiZANidine 4 MG tablet Commonly known as: ZANAFLEX Take 4 mg by mouth every 8 (eight) hours as needed for muscle spasms.   vitamin B-12 1000 MCG tablet Commonly known as: CYANOCOBALAMIN Take 1,000 mcg by mouth daily.   Vitamin D (Ergocalciferol) 1.25 MG (50000 UT) Caps capsule Commonly known as: DRISDOL Take 1 capsule (50,000 Units total) by mouth every 7 (seven) days.   Vitamin D3 125 MCG (5000 UT) Caps Take 1 capsule (5,000 Units total) by mouth daily.   vitamin E 400 UNIT capsule Take 400 Units by mouth daily.        Signed: Dorian Heckle, MD 01/31/2019, 9:43 AM

## 2019-01-31 NOTE — Progress Notes (Signed)
Physical Therapy Treatment Patient Details Name: Tamara Nunez MRN: [id number] DOB: 07/13/1955 Today's Date: 01/31/2019    History of Present Illness Pt is a 63 y/o female s/p L2-L3 redo decompression and PLIF. PMH including but not limited to DM, HTN, CAD s/p CABG in 2006, s/p back surgery (in 2003 per pt).    PT Comments    Patient eager to discharge home today (MD has already been in and stated to d/c after therapy). She continues to require cues to maintain back precautions and for safe use of RW. Husband initially present and confirmed he will be with pt 24/7 for at least one week and will request additional time off from work if needed. Patient able to ambulate 160 ft with minguard assist. Discussed technique for up/down steps and pt reports she does fine with husband's assist +/- cane.     Follow Up Recommendations  Home health PT;Supervision for mobility/OOB     Equipment Recommendations  None recommended by PT(has rollator and does not want 2 wheel RW)    Recommendations for Other Services       Precautions / Restrictions Precautions Precautions: Back;Fall Precaution Booklet Issued: Yes (comment) Precaution Comments: able to state 3/3 precautions; required vc for positioning in bed to not twist Required Braces or Orthoses: Spinal Brace Spinal Brace: Thoracolumbosacral orthotic;Applied in sitting position(pt donned with assist) Restrictions Weight Bearing Restrictions: No    Mobility  Bed Mobility Overal bed mobility: Needs Assistance Bed Mobility: Rolling;Sidelying to Sit Rolling: Supervision Sidelying to sit: Supervision       General bed mobility comments: vc to log roll and not leave shoulders behind when rolling; near need for assist side to sit  Transfers Overall transfer level: Needs assistance Equipment used: Rolling walker (2 wheeled) Transfers: Sit to/from Stand Sit to Stand: Min guard         General transfer comment: vc for safe use of  RW/hand placement x 3 reps  Ambulation/Gait Ambulation/Gait assistance: Min guard Gait Distance (Feet): 160 Feet(20x2 (to/from bathroom)) Assistive device: Rolling walker (2 wheeled) Gait Pattern/deviations: Step-through pattern;Decreased stride length;Trunk flexed Gait velocity: decreased   General Gait Details: vc for proximity to RW and upright posture (repeated cues)   Stairs             Wheelchair Mobility    Modified Rankin (Stroke Patients Only)       Balance Overall balance assessment: Needs assistance Sitting-balance support: Feet supported Sitting balance-Leahy Scale: Good     Standing balance support: During functional activity;No upper extremity supported Standing balance-Leahy Scale: Fair                              Cognition Arousal/Alertness: Awake/alert Behavior During Therapy: WFL for tasks assessed/performed Overall Cognitive Status: Within Functional Limits for tasks assessed                                        Exercises      General Comments General comments (skin integrity, edema, etc.): Husband present at beginning of session. Educated on donning brace (pt will likely be able to do herself when wearing a bra). Exited to find a RW for pt and husband left to get lunch. On return NT checking vitals with BP 102/59. Denied dizziness when walking.       Pertinent Vitals/Pain Pain Assessment: 0-10 Pain  Score: 8  Pain Location: back Pain Descriptors / Indicators: Sore;Grimacing;Guarding Pain Intervention(s): Limited activity within patient's tolerance;Monitored during session;Repositioned    Home Living                      Prior Function            PT Goals (current goals can now be found in the care plan section) Acute Rehab PT Goals Patient Stated Goal: decrease pain, to feel better Time For Goal Achievement: 02/12/19 Potential to Achieve Goals: Good Progress towards PT goals: Progressing  toward goals    Frequency    Min 5X/week      PT Plan Current plan remains appropriate    Co-evaluation              AM-PAC PT "6 Clicks" Mobility   Outcome Measure  Help needed turning from your back to your side while in a flat bed without using bedrails?: None Help needed moving from lying on your back to sitting on the side of a flat bed without using bedrails?: A Little Help needed moving to and from a bed to a chair (including a wheelchair)?: None Help needed standing up from a chair using your arms (e.g., wheelchair or bedside chair)?: A Little Help needed to walk in hospital room?: A Little Help needed climbing 3-5 steps with a railing? : A Little 6 Click Score: 20    End of Session Equipment Utilized During Treatment: Gait belt;Back brace Activity Tolerance: Patient tolerated treatment well Patient left: with call bell/phone within reach;in chair   PT Visit Diagnosis: Other abnormalities of gait and mobility (R26.89);Pain Pain - part of body: (back)     Time: 1150(time adjusted to account for RW search; NT vitals)-1226 PT Time Calculation (min) (ACUTE ONLY): 36 min  Charges:  $Gait Training: 23-37 mins                      Barry Brunner, PT Pager [phone]    Rexanne Mano 01/31/2019, 12:48 PM

## 2019-02-02 ENCOUNTER — Ambulatory Visit: Payer: Commercial Managed Care - PPO | Admitting: "Endocrinology

## 2019-02-04 MED FILL — Heparin Sodium (Porcine) Inj 1000 Unit/ML: INTRAMUSCULAR | Qty: 30 | Status: AC

## 2019-02-04 MED FILL — Sodium Chloride IV Soln 0.9%: INTRAVENOUS | Qty: 1000 | Status: AC

## 2019-02-07 NOTE — Progress Notes (Signed)
  Medical Nutrition Therapy:  Appt start time: 1300 end time:  1400.   Assessment:  Primary concerns today: Prediabetes.  A1C up from 5.95 to 6.3%. Sees Dr, Dorris Fetch, endocrinology. A1C 6.3%. History of gastric bypass.  CMP Latest Ref Rng & Units 01/25/2019 10/18/2018 06/26/2018  Glucose 70 - 99 mg/dL 86 85 95  BUN 8 - 23 mg/dL 15 20 14   Creatinine 0.44 - 1.00 mg/dL 0.95 0.94 0.91  Sodium 135 - 145 mmol/L 137 144 142  Potassium 3.5 - 5.1 mmol/L 4.3 4.7 4.7  Chloride 98 - 111 mmol/L 108 109 108  CO2 22 - 32 mmol/L 22 29 25   Calcium 8.9 - 10.3 mg/dL 9.0 9.2 8.7(L)  Total Protein 6.1 - 8.1 g/dL - 6.5 -  Total Bilirubin 0.2 - 1.2 mg/dL - 0.4 -  Alkaline Phos 39 - 117 IU/L - - -  AST 10 - 35 U/L - 20 -  ALT 6 - 29 U/L - 13 -     Lab Results  Component Value Date   HGBA1C 6.3 (H) 01/25/2019     Preferred Learning Style:     No preference indicated   Learning Readiness:   Ready  Change in progress   MEDICATIONS:    DIETARY INTAKE:   Usually eats 2 meals per day.   Usual physical activity: ADL  Estimated energy needs: 1200  calories 135 g carbohydrates 90 g protein 33 g fat  Progress Towards Goal(s):  In progress.   Nutritional Diagnosis:  NB-1.1 Food and nutrition-related knowledge deficit As related to PreDm .  As evidenced by A1C 6.35.    Intervention: Nutrition and Diabetes education provided on My Plate, CHO counting, meal planning, portion sizes, timing of meals, avoiding snacks between meals unless having a low blood sugar, target ranges for A1C and blood sugars, signs/symptoms and treatment of hyper/hypoglycemia, monitoring blood sugars, taking medications as prescribed, benefits of exercising 30 minutes per day and prevention of complications of DM.  Goals Follow My Plate  Increase fresh fruits and vegetables. Drink only water  Exercise 60 minutes 3-4 times per week..  Teaching Method Utilized:  Visual Auditory Hands on  Handouts given during  visit include:  The Plate Method   Meal Plan Card  Diabetes Instructions.   Barriers to learning/adherence to lifestyle change: none  Demonstrated degree of understanding via:  Teach Back   Monitoring/Evaluation:  Dietary intake, exercise,  , and body weight in 1 month(s).

## 2019-03-17 ENCOUNTER — Encounter: Payer: Self-pay | Admitting: "Endocrinology

## 2019-03-17 ENCOUNTER — Ambulatory Visit (INDEPENDENT_AMBULATORY_CARE_PROVIDER_SITE_OTHER): Payer: Commercial Managed Care - PPO | Admitting: "Endocrinology

## 2019-03-17 DIAGNOSIS — E1159 Type 2 diabetes mellitus with other circulatory complications: Secondary | ICD-10-CM

## 2019-03-17 DIAGNOSIS — E559 Vitamin D deficiency, unspecified: Secondary | ICD-10-CM | POA: Diagnosis not present

## 2019-03-17 DIAGNOSIS — I10 Essential (primary) hypertension: Secondary | ICD-10-CM | POA: Diagnosis not present

## 2019-03-17 DIAGNOSIS — E782 Mixed hyperlipidemia: Secondary | ICD-10-CM

## 2019-03-17 NOTE — Progress Notes (Signed)
03/17/2019, 5:16 PM                                                                 Endocrinology Telehealth Visit Follow up Note -During COVID -19 Pandemic  This visit type was conducted due to national recommendations for restrictions regarding the COVID-19 Pandemic  in an effort to limit this patient's exposure and mitigate transmission of the corona virus.  Due to her co-morbid illnesses, Tamara Nunez is at  moderate to high risk for complications without adequate follow up.  This format is felt to be most appropriate for her at this time.  I connected with this patient on 03/17/2019   by telephone and verified that I am speaking with the correct person using two identifiers. Derwood Kaplan, [id number]. she has verbally consented to this visit. All issues noted in this document were discussed and addressed. The format was not optimal for physical exam.   Subjective:    Patient ID: MYLAN LENGYEL, female    DOB: 65/18/1957.  Tamara Nunez is being engaged in telehealth via telephone follow-up  for management of currently controlled symptomatic diabetes requested by  Theodoro Kos, MD.   Past Medical History:  Diagnosis Date  . Arthritis    "in my back" (05/06/2017)  . CAD (coronary artery disease)    CABG 2006, 09/2016 DES RCA, 04/2017 DES Circ, 06/2018 DES 1st diag  . Chronic back pain   . Coronary artery disease    a. s/p CABG in 2006 with LIMA-LAD, SVG-dLAD. b. cath in 2009 showing patent grafts. c. 09/2016: cath with occluded LAD but patent SVG-dLAD. LIMA was atretic. pRCA with 80% stenosis --> treated with DES  . Fibromyalgia   . GERD (gastroesophageal reflux disease)   . Hyperlipidemia   . Hypertension   . Type II diabetes mellitus (HCC)     Past Surgical History:  Procedure Laterality Date  . ABDOMINAL HERNIA REPAIR  2018  . BACK SURGERY    . CARDIAC CATHETERIZATION  01/13/2008  .  COLONOSCOPY    . CORONARY ANGIOPLASTY WITH STENT PLACEMENT  09/26/2016   Prox RCA lesion, 80 %stenosed. DES   . CORONARY ARTERY BYPASS GRAFT  12/23/2004   2 vessel - LIMA to LAD (atretic), SVG to distal LAD  . CORONARY STENT INTERVENTION N/A 09/26/2016   Procedure: Coronary Stent Intervention;  Surgeon: Marykay Lex, MD;  Location: [facility] INVASIVE CV LAB;  Service: Cardiovascular;  Laterality: N/A;  . CORONARY STENT INTERVENTION N/A 05/06/2017   Procedure: CORONARY STENT INTERVENTION;  Surgeon: Kathleene Hazel, MD;  Location: [facility] INVASIVE CV LAB;  Service: Cardiovascular;  Laterality: N/A;  . CORONARY STENT INTERVENTION N/A 06/25/2018   Procedure: CORONARY STENT INTERVENTION;  Surgeon: Swaziland, Peter M, MD;  Location: [facility] INVASIVE CV LAB;  Service: Cardiovascular;  Laterality: N/A;  . DOPPLER ECHOCARDIOGRAPHY  04/12/2008  EF 60%  . GASTRIC BYPASS  2009  . HERNIA REPAIR    . LEFT HEART CATH AND CORONARY ANGIOGRAPHY N/A 05/06/2017   Procedure: LEFT HEART CATH AND CORONARY ANGIOGRAPHY;  Surgeon: Kathleene HazelMcAlhany, Christopher D, MD;  Location: [facility] INVASIVE CV LAB;  Service: Cardiovascular;  Laterality: N/A;  . LEFT HEART CATH AND CORS/GRAFTS ANGIOGRAPHY N/A 09/26/2016   Procedure: Left Heart Cath and Cors/Grafts Angiography;  Surgeon: Marykay LexHarding, David W, MD;  [facility];  Service: Cardiovascular;  Laterality: N/A;  . LEFT HEART CATH AND CORS/GRAFTS ANGIOGRAPHY N/A 06/25/2018   Procedure: LEFT HEART CATH AND CORS/GRAFTS ANGIOGRAPHY;  Surgeon: SwazilandJordan, Peter M, MD;  [facility];  Service: Cardiovascular;  Laterality: N/A;  . NM MYOVIEW LTD  06/03/2012   EF 71%  . POSTERIOR FUSION LUMBAR SPINE  1990s  . TUBAL LIGATION    . VAGINAL HYSTERECTOMY     "partial"    Social History   Socioeconomic History  . Marital status: Married    Spouse name: Not on file  . Number of children: Not on file  . Years of education: Not on file  . Highest education level: Not on file   Occupational History  . Not on file  Tobacco Use  . Smoking status: Never Smoker  . Smokeless tobacco: Never Used  Substance and Sexual Activity  . Alcohol use: No  . Drug use: No  . Sexual activity: Not on file  Other Topics Concern  . Not on file  Social History Narrative  . Not on file   Social Determinants of Health   Financial Resource Strain:   . Difficulty of Paying Living Expenses: Not on file  Food Insecurity:   . Worried About Programme researcher, broadcasting/film/videounning Out of Food in the Last Year: Not on file  . Ran Out of Food in the Last Year: Not on file  Transportation Needs:   . Lack of Transportation (Medical): Not on file  . Lack of Transportation (Non-Medical): Not on file  Physical Activity:   . Days of Exercise per Week: Not on file  . Minutes of Exercise per Session: Not on file  Stress:   . Feeling of Stress : Not on file  Social Connections:   . Frequency of Communication with Friends and Family: Not on file  . Frequency of Social Gatherings with Friends and Family: Not on file  . Attends Religious Services: Not on file  . Active Member of Clubs or Organizations: Not on file  . Attends BankerClub or Organization Meetings: Not on file  . Marital Status: Not on file    Family History  Problem Relation Age of Onset  . Heart disease Mother   . Cancer - Lung Father     Outpatient Encounter Medications as of 03/17/2019  Medication Sig  . acetaminophen (TYLENOL) 500 MG tablet Take 1,000 mg by mouth every 6 (six) hours as needed for moderate pain or headache.  . albuterol (PROVENTIL HFA;VENTOLIN HFA) 108 (90 BASE) MCG/ACT inhaler Inhale 2 puffs into the lungs every 6 (six) hours as needed for wheezing.  Marland Kitchen. aspirin EC 81 MG tablet Take 81 mg by mouth daily.  . cetirizine (ZYRTEC) 10 MG tablet Take 10 mg by mouth daily.   . Cholecalciferol (VITAMIN D3) 125 MCG (5000 UT) CAPS Take 1 capsule (5,000 Units total) by mouth daily. (Patient not taking: Reported on 01/18/2019)  . co-enzyme Q-10 50 MG  capsule Take 200 mg by mouth daily.  . diclofenac sodium (  VOLTAREN) 1 % GEL Apply 2 g topically 4 (four) times daily as needed (pain).   . fluticasone (FLONASE) 50 MCG/ACT nasal spray Place 1 spray into both nostrils daily.   . furosemide (LASIX) 40 MG tablet TAKE 1 TABLET BY MOUTH  DAILY AS NEEDED FOR FLUID  OR EDEMA. (Patient taking differently: Take 40 mg by mouth daily as needed for fluid. )  . gabapentin (NEURONTIN) 600 MG tablet Take 600 mg by mouth 3 (three) times daily.   Marland Kitchen. HYDROcodone-acetaminophen (NORCO/VICODIN) 5-325 MG tablet Take 1-2 tablets by mouth every 4 (four) hours as needed for moderate pain ((score 7 to 10)).  Marland Kitchen. isosorbide mononitrate (IMDUR) 60 MG 24 hr tablet TAKE 1 AND 1/2 TABLETS BY  MOUTH TWO TIMES DAILY (Patient taking differently: Take 90 mg by mouth daily. )  . lidocaine (LIDODERM) 5 % Place 2-3 patches onto the skin daily as needed (pain). Remove & Discard patch within 12 hours or as directed by MD   . methocarbamol (ROBAXIN) 500 MG tablet Take 1 tablet (500 mg total) by mouth every 8 (eight) hours as needed for muscle spasms.  . metoprolol tartrate (LOPRESSOR) 25 MG tablet Take 25 mg by mouth 2 (two) times daily.  . nitroGLYCERIN (NITROLINGUAL) 0.4 MG/SPRAY spray Place 1 spray under the tongue every 5 (five) minutes as needed for chest pain.  Letta Pate. ONETOUCH ULTRA test strip Test BG qid  . pantoprazole (PROTONIX) 40 MG tablet Take 40 mg by mouth daily as needed (acid reflux).   . polyethylene glycol powder (GLYCOLAX/MIRALAX) powder Take 17 g by mouth daily.   . ramipril (ALTACE) 5 MG capsule Take 5 mg by mouth at bedtime.   . ranolazine (RANEXA) 1000 MG SR tablet Take 1 tablet (1,000 mg total) by mouth 2 (two) times daily.  . simvastatin (ZOCOR) 20 MG tablet Take 20 mg by mouth at bedtime.  Marland Kitchen. tiZANidine (ZANAFLEX) 4 MG tablet Take 4 mg by mouth every 8 (eight) hours as needed for muscle spasms.   . vitamin B-12 (CYANOCOBALAMIN) 1000 MCG tablet Take 1,000 mcg by mouth  daily.  . Vitamin D, Ergocalciferol, (DRISDOL) 1.25 MG (50000 UT) CAPS capsule Take 1 capsule (50,000 Units total) by mouth every 7 (seven) days. (Patient not taking: Reported on 01/18/2019)  . vitamin E 400 UNIT capsule Take 400 Units by mouth daily.   No facility-administered encounter medications on file as of 03/17/2019.    ALLERGIES: Allergies  Allergen Reactions  . Codeine Hives and Shortness Of Breath       . Other Anaphylaxis    Almonds   . Ciprofloxacin Itching  . Valsartan Rash  . Adhesive [Tape] Itching    "clear tape" used after surgery."took all her skin off".  Paper take is ok  . Sulfa Antibiotics Nausea Only    VACCINATION STATUS:  There is no immunization history on file for this patient.  Diabetes She presents for her follow-up diabetic visit. She has type 2 diabetes mellitus. Onset time: She was diagnosed at approximate age of 34 years. Her disease course has been stable. There are no hypoglycemic associated symptoms. Pertinent negatives for hypoglycemia include no confusion, headaches, pallor or seizures. Associated symptoms include fatigue. Pertinent negatives for diabetes include no chest pain, no polydipsia, no polyphagia and no polyuria. There are no hypoglycemic complications. Symptoms are stable. Diabetic complications include heart disease. Risk factors for coronary artery disease include dyslipidemia, diabetes mellitus, hypertension, sedentary lifestyle and post-menopausal. Her weight is fluctuating minimally. She is following a  generally unhealthy diet. When asked about meal planning, she reported none. She has not had a previous visit with a dietitian. She never participates in exercise. There is no change in her home blood glucose trend. An ACE inhibitor/angiotensin II receptor blocker is being taken. Eye exam is current.  Hyperlipidemia This is a chronic problem. The current episode started more than 1 year ago. The problem is controlled. Exacerbating  diseases include diabetes. Pertinent negatives include no chest pain, myalgias or shortness of breath. Risk factors for coronary artery disease include dyslipidemia, diabetes mellitus, hypertension, a sedentary lifestyle and post-menopausal.  Hypertension This is a chronic problem. The problem is controlled. Pertinent negatives include no chest pain, headaches, palpitations or shortness of breath. Risk factors for coronary artery disease include dyslipidemia, diabetes mellitus, sedentary lifestyle, family history and post-menopausal state. Past treatments include ACE inhibitors. Hypertensive end-organ damage includes CAD/MI.    Review of systems: Limited as above.  Objective:    There were no vitals taken for this visit.  Wt Readings from Last 3 Encounters:  01/28/19 167 lb 8 oz (76 kg)  01/25/19 167 lb 8 oz (76 kg)  12/27/18 172 lb (78 kg)       CMP     Component Value Date/Time   NA 137 01/25/2019 1055   NA 141 06/22/2018 1119   K 4.3 01/25/2019 1055   CL 108 01/25/2019 1055   CO2 22 01/25/2019 1055   GLUCOSE 86 01/25/2019 1055   BUN 15 01/25/2019 1055   BUN 16 06/22/2018 1119   CREATININE 0.95 01/25/2019 1055   CREATININE 0.94 10/18/2018 1000   CALCIUM 9.0 01/25/2019 1055   PROT 6.5 10/18/2018 1000   PROT 7.1 01/01/2018 0900   ALBUMIN 4.2 01/01/2018 0900   AST 20 10/18/2018 1000   ALT 13 10/18/2018 1000   ALKPHOS 92 01/01/2018 0900   BILITOT 0.4 10/18/2018 1000   BILITOT 0.3 01/01/2018 0900   GFRNONAA >60 01/25/2019 1055   GFRNONAA 64 10/18/2018 1000   GFRAA >60 01/25/2019 1055   GFRAA 74 10/18/2018 1000     Diabetic Labs (most recent): Lab Results  Component Value Date   HGBA1C 6.3 (H) 01/25/2019   HGBA1C 5.9 (H) 10/18/2018   HGBA1C 5.8 05/12/2018     Lipid Panel ( most recent) Lipid Panel     Component Value Date/Time   CHOL 131 07/05/2018 1152   TRIG 48 07/05/2018 1152   HDL 83 07/05/2018 1152   CHOLHDL 1.6 07/05/2018 1152   CHOLHDL 1.8 04/15/2010  0400   VLDL 3 04/15/2010 0400   LDLCALC 38 07/05/2018 1152      Lab Results  Component Value Date   TSH 1.47 05/12/2018   TSH 1.540 01/01/2018   TSH 1.268 04/14/2010       Assessment & Plan:   1. DM type 2 causing vascular disease ([place])   - TYREESHA MAHARAJ has currently controlled  type 2 DM since  64 years of age. -She is not on any medication for diabetes at this time, her previsit labs show A1c of 6.3% stable.  She does not report any hypoglycemia nor hyperglycemia.  -her diabetes is complicated by coronary artery disease, sedentary life, and she remains at a high risk for more acute and chronic complications which include CAD, CVA, CKD, retinopathy, and neuropathy. These are all discussed in detail with her.  - I have counseled her on diet management and weight loss, by adopting a carbohydrate restricted/protein rich diet.  - she  admits there is a room for improvement in her diet and drink choices. -  Suggestion is made for her to avoid simple carbohydrates  from her diet including Cakes, Sweet Desserts / Pastries, Ice Cream, Soda (diet and regular), Sweet Tea, Candies, Chips, Cookies, Sweet Pastries,  Store Bought Juices, Alcohol in Excess of  1-2 drinks a day, Artificial Sweeteners, Coffee Creamer, and "Sugar-free" Products. This will help patient to have stable blood glucose profile and potentially avoid unintended weight gain.  - I encouraged her to switch to  unprocessed or minimally processed complex starch and increased protein intake (animal or plant source), fruits, and vegetables.  - she is advised to stick to a routine mealtimes to eat 3 meals  a day and avoid unnecessary snacks ( to snack only to correct hypoglycemia).  - I have approached her with the following individualized plan to manage  her diabetes and patient agrees:   -Her presentation with the stable A1c of 6.3% with normal medication, she will not be initiated on any antidiabetes medication at this  time. -She will be considered for treatment likely with low-grade Metformin as  appropriate on her next visit.  - Patient specific target  A1c;  LDL, HDL, Triglycerides, and  Waist Circumference were discussed in detail.  2) Blood Pressure /Hypertension:  she is advised to home monitor blood pressure and report if > 140/90 on 2 separate readings. -   she is advised to continue her current medications including  Ramipril 5 mg p.o. daily with breakfast . 3) Lipids/Hyperlipidemia:   Review of her recent lipid panel showed  controlled  LDL at 38 .  she  is advised to continue simvastatin 20 mg p.o. daily at bedtime.  4)  Weight/Diet: Her BMI is 28.-She is not a candidate for major weight loss, I discussed with her the fact that loss of 5 - 10% of her  current body weight will have the most impact on her diabetes management.  CDE Consult will be initiated . Exercise, and detailed carbohydrates information provided  -  detailed on discharge instructions.  5) vitamin D deficiency: -He is advised to continue vitamin D3 5000 units daily x90 days.  6) Chronic Care/Health Maintenance:  -she  is on ACEI/ARB and Statin medications and  is encouraged to initiate and continue to follow up with Ophthalmology, Dentist,  Podiatrist at least yearly or according to recommendations, and advised to   stay away from smoking. I have recommended yearly flu vaccine and pneumonia vaccine at least every 5 years; moderate intensity exercise for up to 150 minutes weekly; and  sleep for at least 7 hours a day.  - she is  advised to maintain close follow up with Theodoro Kos, MD for primary care needs, as well as her other providers for optimal and coordinated care.  - Time spent on this patient care encounter:  35 min, of which >50% was spent in  counseling and the rest reviewing her  current and  previous labs/studies ( including abstraction from other facilities),  previous treatments, her blood glucose readings, and  medications' doses and developing a plan for long-term care based on the latest recommendations for standards of care; and documenting her care.  Derwood Kaplan participated in the discussions, expressed understanding, and voiced agreement with the above plans.  All questions were answered to her satisfaction. she is encouraged to contact clinic should she have any questions or concerns prior to her return visit.   Follow up  plan: - Return in about 4 months (around 07/15/2019) for Bring Meter and Logs- A1c in Office.  Marquis Lunch, MD [facility] [address] [place], [place] [address] Phone: [phone]  Fax: [phone]    03/17/2019, 5:16 PM  This note was partially dictated with voice recognition software. Similar sounding words can be transcribed inadequately or may not  be corrected upon review.

## 2019-03-21 DIAGNOSIS — M5416 Radiculopathy, lumbar region: Secondary | ICD-10-CM | POA: Diagnosis not present

## 2019-03-21 DIAGNOSIS — M47816 Spondylosis without myelopathy or radiculopathy, lumbar region: Secondary | ICD-10-CM | POA: Diagnosis not present

## 2019-03-21 DIAGNOSIS — I10 Essential (primary) hypertension: Secondary | ICD-10-CM | POA: Diagnosis not present

## 2019-03-21 DIAGNOSIS — Z6829 Body mass index (BMI) 29.0-29.9, adult: Secondary | ICD-10-CM | POA: Diagnosis not present

## 2019-03-21 DIAGNOSIS — M545 Low back pain: Secondary | ICD-10-CM | POA: Diagnosis not present

## 2019-03-21 DIAGNOSIS — M48062 Spinal stenosis, lumbar region with neurogenic claudication: Secondary | ICD-10-CM | POA: Diagnosis not present

## 2019-04-04 DIAGNOSIS — M7581 Other shoulder lesions, right shoulder: Secondary | ICD-10-CM | POA: Diagnosis not present

## 2019-04-19 ENCOUNTER — Ambulatory Visit: Payer: Commercial Managed Care - PPO | Admitting: Nutrition

## 2019-04-27 ENCOUNTER — Encounter: Payer: Self-pay | Admitting: "Endocrinology

## 2019-04-27 LAB — LIPID PANEL
Cholesterol: 162 (ref 0–200)
HDL: 89 — AB (ref 35–70)
LDL Cholesterol: 63
Triglycerides: 48 (ref 40–160)

## 2019-04-27 LAB — VITAMIN D 25 HYDROXY (VIT D DEFICIENCY, FRACTURES): Vit D, 25-Hydroxy: 19

## 2019-04-27 LAB — HEMOGLOBIN A1C: Hemoglobin A1C: 6.3

## 2019-04-27 LAB — BASIC METABOLIC PANEL
BUN: 14 (ref 4–21)
Creatinine: 0.8 (ref 0.5–1.1)

## 2019-04-27 LAB — COMPREHENSIVE METABOLIC PANEL: Calcium: 8.8 (ref 8.7–10.7)

## 2019-04-27 LAB — TSH: TSH: 1.16 (ref 0.41–5.90)

## 2019-04-27 LAB — MICROALBUMIN, URINE: Microalb, Ur: 1.04

## 2019-05-16 DIAGNOSIS — M47816 Spondylosis without myelopathy or radiculopathy, lumbar region: Secondary | ICD-10-CM | POA: Diagnosis not present

## 2019-05-16 DIAGNOSIS — M545 Low back pain: Secondary | ICD-10-CM | POA: Diagnosis not present

## 2019-05-16 DIAGNOSIS — M48062 Spinal stenosis, lumbar region with neurogenic claudication: Secondary | ICD-10-CM | POA: Diagnosis not present

## 2019-05-30 DIAGNOSIS — M170 Bilateral primary osteoarthritis of knee: Secondary | ICD-10-CM | POA: Diagnosis not present

## 2019-06-05 ENCOUNTER — Other Ambulatory Visit: Payer: Self-pay | Admitting: Internal Medicine

## 2019-07-05 ENCOUNTER — Telehealth: Payer: Self-pay | Admitting: Internal Medicine

## 2019-07-05 NOTE — Telephone Encounter (Signed)
   Pt c/o medication issue:  1. Name of Medication: clopidogrel tab 75mg    2. How are you currently taking this medication (dosage and times per day)? 1 tablet a day  3. Are you having a reaction (difficulty breathing--STAT)? Bruising  4. What is your medication issue? Pt said her bruising is getting worst. Her lower part through her bigger part of the leg has bruise on it and she said it hurts when she touch it. She wasn't sure if its because she taking her blood thinner. She made an appt with on 08/03/19 but would like to speak with a nurse  Please call

## 2019-07-05 NOTE — Telephone Encounter (Signed)
Patient aware of MD advice. She was offered sooner OV with another APP but prefers to see [facility] as she has seen him in the past. She is aware to continue plavix until this visit in May

## 2019-07-05 NOTE — Telephone Encounter (Signed)
Can likely come off Plavix soon since her stent was 1 year ago - can d/w Azalee Course at follow-up in May.  Dr Rexene Edison

## 2019-07-05 NOTE — Telephone Encounter (Signed)
Called and spoke with pt. She denies any bleeding or other symptoms besides the bruise to her leg that is painful. She would like to know if her blood thinner is causing this or if it is another medication. Notified I would send this message to our pharmacists and Dr.Hilty for review. Pt verbalized understanding with no other questions at this time.

## 2019-07-11 ENCOUNTER — Other Ambulatory Visit: Payer: Self-pay | Admitting: Internal Medicine

## 2019-07-20 ENCOUNTER — Encounter: Payer: Self-pay | Admitting: Nutrition

## 2019-07-20 ENCOUNTER — Ambulatory Visit (INDEPENDENT_AMBULATORY_CARE_PROVIDER_SITE_OTHER): Payer: Commercial Managed Care - PPO | Admitting: "Endocrinology

## 2019-07-20 ENCOUNTER — Encounter: Payer: Self-pay | Admitting: "Endocrinology

## 2019-07-20 ENCOUNTER — Ambulatory Visit: Payer: Commercial Managed Care - PPO | Admitting: "Endocrinology

## 2019-07-20 ENCOUNTER — Encounter: Payer: Commercial Managed Care - PPO | Attending: "Endocrinology | Admitting: Nutrition

## 2019-07-20 ENCOUNTER — Other Ambulatory Visit: Payer: Self-pay

## 2019-07-20 VITALS — BP 153/88 | HR 54 | Ht 64.0 in | Wt 171.0 lb

## 2019-07-20 VITALS — Ht 65.0 in | Wt 171.0 lb

## 2019-07-20 DIAGNOSIS — E1159 Type 2 diabetes mellitus with other circulatory complications: Secondary | ICD-10-CM

## 2019-07-20 DIAGNOSIS — E559 Vitamin D deficiency, unspecified: Secondary | ICD-10-CM | POA: Diagnosis not present

## 2019-07-20 DIAGNOSIS — E119 Type 2 diabetes mellitus without complications: Secondary | ICD-10-CM | POA: Diagnosis present

## 2019-07-20 DIAGNOSIS — I10 Essential (primary) hypertension: Secondary | ICD-10-CM

## 2019-07-20 DIAGNOSIS — E782 Mixed hyperlipidemia: Secondary | ICD-10-CM | POA: Diagnosis present

## 2019-07-20 MED ORDER — VITAMIN D (ERGOCALCIFEROL) 1.25 MG (50000 UNIT) PO CAPS: 50000.0000 [IU] | ORAL_CAPSULE | ORAL | 0 refills | Status: DC

## 2019-07-20 MED ORDER — VITAMIN D3 125 MCG (5000 UT) PO CAPS: 5000.0000 [IU] | ORAL_CAPSULE | Freq: Every day | ORAL | 1 refills | Status: DC

## 2019-07-20 NOTE — Progress Notes (Signed)
  Medical Nutrition Therapy:  Appt start time: 0915 end time: 0945  Assessment:  Primary concerns today: Diabetes Type 2, controlled,.  A1C up from 5.9% to 6.3%. Taking Vit D makes her constipated. Takes a stool softener. Taking vitamins. Complains of bruising in her legs.  Sees Dr, Fransico Him, endocrinology. A1C 6.3%. History of gastric bypass. Physical activity: walks when her knees let her. Has issues with her knee and seeing Ortho for that. Wants help with meal planning to help keep her weight down.  CMP Latest Ref Rng & Units 01/25/2019 10/18/2018 06/26/2018  Glucose 70 - 99 mg/dL 86 85 95  BUN 8 - 23 mg/dL 15 20 14   Creatinine 0.44 - 1.00 mg/dL 1.61 0.96  Sodium 135 - 145 mmol/L 137 144 142  Potassium 3.5 - 5.1 mmol/L 4.3 4.7 4.7  Chloride 98 - 111 mmol/L 108 109 108  CO2 22 - 32 mmol/L 22 29 25   Calcium 8.9 - 10.3 mg/dL 9.0 9.2 0.45)  Total Protein 6.1 - 8.1 g/dL - 6.5 -  Total Bilirubin 0.2 - 1.2 mg/dL - 0.4 -  Alkaline Phos 39 - 117 IU/L - - -  AST 10 - 35 U/L - 20 -  ALT 6 - 29 U/L - 13 -     Lab Results  Component Value Date   HGBA1C 6.3 (H) 01/25/2019     Preferred Learning Style:     No preference indicated   Learning Readiness:   Ready  Change in progress   MEDICATIONS:    DIETARY INTAKE:  B) Egg, coffee and 1 slice toast L) skipped:  1/2 spam sandwich D) Toss salad, broccoli and chicken, water   Usual physical activity: ADL  Estimated energy needs: 1200  calories 135 g carbohydrates 90 g protein 33 g fat  Progress Towards Goal(s):  In progress.   Nutritional Diagnosis:  NB-1.1 Food and nutrition-related knowledge deficit As related to PreDm .  As evidenced by A1C 6.35.    Intervention: Nutrition and Diabetes education provided on My Plate, CHO counting, meal planning, portion sizes, timing of meals, avoiding snacks between meals unless having a low blood sugar, target ranges for A1C and blood sugars, signs/symptoms and treatment of  hyper/hypoglycemia, monitoring blood sugars, taking medications as prescribed, benefits of exercising 30 minutes per day and prevention of complications of DM.  Goals Follow My Plate Eat 30 g CHO at meals Increase protein with meals and snacks.  Increase fresh fruits and vegetables. Drink only water  Exercise by chair exercises Cut out processed high fat foods-spam, bologna, packaged snacks and foods.  Teaching Method Utilized:  Visual Auditory Hands on  Handouts given during visit include:  The Plate Method   Meal Plan Card  Diabetes Instructions.   Barriers to learning/adherence to lifestyle change: none  Demonstrated degree of understanding via:  Teach Back   Monitoring/Evaluation:  Dietary intake, exercise,  , and body weight in  6 month(s).

## 2019-07-20 NOTE — Progress Notes (Signed)
07/20/2019, 7:41 PM            Endocrinology follow-up note  Subjective:    Patient ID: [name], female    DOB: 28-Apr-1955.  Tamara Nunez is being engaged in follow-up for type 2 diabetes which is currently controlled on diet and exercise with A1c of 6.3%.    PMD:   Theodoro Kos, MD.   Past Medical History:  Diagnosis Date  . Arthritis    "in my back" (05/06/2017)  . CAD (coronary artery disease)    CABG 2006, 09/2016 DES RCA, 04/2017 DES Circ, 06/2018 DES 1st diag  . Chronic back pain   . Coronary artery disease    a. s/p CABG in 2006 with LIMA-LAD, SVG-dLAD. b. cath in 2009 showing patent grafts. c. 09/2016: cath with occluded LAD but patent SVG-dLAD. LIMA was atretic. pRCA with 80% stenosis --> treated with DES  . Fibromyalgia   . GERD (gastroesophageal reflux disease)   . Hyperlipidemia   . Hypertension   . Type II diabetes mellitus (HCC)     Past Surgical History:  Procedure Laterality Date  . ABDOMINAL HERNIA REPAIR  2018  . BACK SURGERY    . CARDIAC CATHETERIZATION  01/13/2008  . COLONOSCOPY    . CORONARY ANGIOPLASTY WITH STENT PLACEMENT  09/26/2016   Prox RCA lesion, 80 %stenosed. DES   . CORONARY ARTERY BYPASS GRAFT  12/23/2004   2 vessel - LIMA to LAD (atretic), SVG to distal LAD  . CORONARY STENT INTERVENTION N/A 09/26/2016   Procedure: Coronary Stent Intervention;  Surgeon: Marykay Lex, MD;  Location: [facility] INVASIVE CV LAB;  Service: Cardiovascular;  Laterality: N/A;  . CORONARY STENT INTERVENTION N/A 05/06/2017   Procedure: CORONARY STENT INTERVENTION;  Surgeon: Kathleene Hazel, MD;  Location: [facility] INVASIVE CV LAB;  Service: Cardiovascular;  Laterality: N/A;  . CORONARY STENT INTERVENTION N/A 06/25/2018   Procedure: CORONARY STENT INTERVENTION;  Surgeon: Swaziland, Peter M, MD;  Location: [facility] INVASIVE CV LAB;  Service: Cardiovascular;  Laterality: N/A;  .  DOPPLER ECHOCARDIOGRAPHY  04/12/2008   EF 60%  . GASTRIC BYPASS  2009  . HERNIA REPAIR    . LEFT HEART CATH AND CORONARY ANGIOGRAPHY N/A 05/06/2017   Procedure: LEFT HEART CATH AND CORONARY ANGIOGRAPHY;  Surgeon: Kathleene Hazel, MD;  Location: [facility] INVASIVE CV LAB;  Service: Cardiovascular;  Laterality: N/A;  . LEFT HEART CATH AND CORS/GRAFTS ANGIOGRAPHY N/A 09/26/2016   Procedure: Left Heart Cath and Cors/Grafts Angiography;  Surgeon: Marykay Lex, MD;  Location: [facility] INVASIVE CV LAB;  Service: Cardiovascular;  Laterality: N/A;  . LEFT HEART CATH AND CORS/GRAFTS ANGIOGRAPHY N/A 06/25/2018   Procedure: LEFT HEART CATH AND CORS/GRAFTS ANGIOGRAPHY;  Surgeon: Swaziland, Peter M, MD;  Location: [facility] INVASIVE CV LAB;  Service: Cardiovascular;  Laterality: N/A;  . NM MYOVIEW LTD  06/03/2012   EF 71%  . POSTERIOR FUSION LUMBAR SPINE  1990s  . TUBAL LIGATION    . VAGINAL HYSTERECTOMY     "partial"    Social History   Socioeconomic History  . Marital status: Married  Spouse name: Not on file  . Number of children: Not on file  . Years of education: Not on file  . Highest education level: Not on file  Occupational History  . Not on file  Tobacco Use  . Smoking status: Never Smoker  . Smokeless tobacco: Never Used  Substance and Sexual Activity  . Alcohol use: No  . Drug use: No  . Sexual activity: Not on file  Other Topics Concern  . Not on file  Social History Narrative  . Not on file   Social Determinants of Health   Financial Resource Strain:   . Difficulty of Paying Living Expenses:   Food Insecurity:   . Worried About Charity fundraiser in the Last Year:   . Arboriculturist in the Last Year:   Transportation Needs:   . Film/video editor (Medical):   Marland Kitchen Lack of Transportation (Non-Medical):   Physical Activity:   . Days of Exercise per Week:   . Minutes of Exercise per Session:   Stress:   . Feeling of Stress :   Social Connections:   . Frequency of  Communication with Friends and Family:   . Frequency of Social Gatherings with Friends and Family:   . Attends Religious Services:   . Active Member of Clubs or Organizations:   . Attends Archivist Meetings:   Marland Kitchen Marital Status:     Family History  Problem Relation Age of Onset  . Heart disease Mother   . Cancer - Lung Father     Outpatient Encounter Medications as of 07/20/2019  Medication Sig  . Cholecalciferol (VITAMIN D3) 125 MCG (5000 UT) CAPS Take 1 capsule (5,000 Units total) by mouth daily.  . [DISCONTINUED] Cholecalciferol (VITAMIN D3) 125 MCG (5000 UT) CAPS Take 1 capsule (5,000 Units total) by mouth daily. (Patient taking differently: Take 5,000 Units by mouth 2 (two) times a week. )  . acetaminophen (TYLENOL) 500 MG tablet Take 1,000 mg by mouth every 6 (six) hours as needed for moderate pain or headache.  . albuterol (PROVENTIL HFA;VENTOLIN HFA) 108 (90 BASE) MCG/ACT inhaler Inhale 2 puffs into the lungs every 6 (six) hours as needed for wheezing.  Marland Kitchen aspirin EC 81 MG tablet Take 81 mg by mouth daily.  . cetirizine (ZYRTEC) 10 MG tablet Take 10 mg by mouth daily.   Marland Kitchen co-enzyme Q-10 50 MG capsule Take 200 mg by mouth daily.  . diclofenac sodium (VOLTAREN) 1 % GEL Apply 2 g topically 4 (four) times daily as needed (pain).   . fluticasone (FLONASE) 50 MCG/ACT nasal spray Place 1 spray into both nostrils daily.   . furosemide (LASIX) 40 MG tablet Take 1 tablet (40 mg total) by mouth daily.  Marland Kitchen gabapentin (NEURONTIN) 600 MG tablet Take 600 mg by mouth 3 (three) times daily.   Marland Kitchen HYDROcodone-acetaminophen (NORCO/VICODIN) 5-325 MG tablet Take 1-2 tablets by mouth every 4 (four) hours as needed for moderate pain ((score 7 to 10)).  Marland Kitchen isosorbide mononitrate (IMDUR) 60 MG 24 hr tablet TAKE 1 AND 1/2 TABLETS BY  MOUTH TWICE DAILY  . lidocaine (LIDODERM) 5 % Place 2-3 patches onto the skin daily as needed (pain). Remove & Discard patch within 12 hours or as directed by MD   .  methocarbamol (ROBAXIN) 500 MG tablet Take 1 tablet (500 mg total) by mouth every 8 (eight) hours as needed for muscle spasms.  . metoprolol tartrate (LOPRESSOR) 25 MG tablet Take 25 mg by mouth  2 (two) times daily.  . nitroGLYCERIN (NITROLINGUAL) 0.4 MG/SPRAY spray Place 1 spray under the tongue every 5 (five) minutes as needed for chest pain.  Letta Pate ULTRA test strip Test BG qid  . pantoprazole (PROTONIX) 40 MG tablet Take 40 mg by mouth daily as needed (acid reflux).   . polyethylene glycol powder (GLYCOLAX/MIRALAX) powder Take 17 g by mouth daily.   . ramipril (ALTACE) 5 MG capsule Take 5 mg by mouth at bedtime.   . ranolazine (RANEXA) 1000 MG SR tablet Take 1 tablet (1,000 mg total) by mouth 2 (two) times daily.  . simvastatin (ZOCOR) 20 MG tablet Take 20 mg by mouth at bedtime.  Marland Kitchen tiZANidine (ZANAFLEX) 4 MG tablet Take 4 mg by mouth every 8 (eight) hours as needed for muscle spasms.   . vitamin B-12 (CYANOCOBALAMIN) 1000 MCG tablet Take 1,000 mcg by mouth daily.  . Vitamin D, Ergocalciferol, (DRISDOL) 1.25 MG (50000 UNIT) CAPS capsule Take 1 capsule (50,000 Units total) by mouth every 7 (seven) days.  . vitamin E 400 UNIT capsule Take 400 Units by mouth daily.  Marland Kitchen VITRON-C 65-125 MG TABS Take 1 tablet by mouth 2 (two) times daily.  . [DISCONTINUED] Vitamin D, Ergocalciferol, (DRISDOL) 1.25 MG (50000 UT) CAPS capsule Take 1 capsule (50,000 Units total) by mouth every 7 (seven) days. (Patient not taking: Reported on 01/18/2019)   No facility-administered encounter medications on file as of 07/20/2019.    ALLERGIES: Allergies  Allergen Reactions  . Codeine Hives and Shortness Of Breath       . Other Anaphylaxis    Almonds   . Ciprofloxacin Itching  . Valsartan Rash  . Adhesive [Tape] Itching    "clear tape" used after surgery."took all her skin off".  Paper take is ok  . Sulfa Antibiotics Nausea Only    VACCINATION STATUS:  There is no immunization history on file for this  patient.  Diabetes She presents for her follow-up diabetic visit. She has type 2 diabetes mellitus. Onset time: She was diagnosed at approximate age of 34 years. Her disease course has been stable. There are no hypoglycemic associated symptoms. Pertinent negatives for hypoglycemia include no confusion, headaches, pallor or seizures. Associated symptoms include fatigue. Pertinent negatives for diabetes include no chest pain, no polydipsia, no polyphagia and no polyuria. There are no hypoglycemic complications. Symptoms are improving. Diabetic complications include heart disease. Risk factors for coronary artery disease include dyslipidemia, diabetes mellitus, hypertension, sedentary lifestyle and post-menopausal. When asked about current treatments, none were reported. Her weight is fluctuating minimally. She is following a generally unhealthy diet. When asked about meal planning, she reported none. She has not had a previous visit with a dietitian. She never participates in exercise. Her home blood glucose trend is decreasing steadily. (She presents with tightly controlled glycemic profile both fasting and postprandial.  No major hypoglycemia nor hyperglycemia.  Her previsit labs show A1c of 6.3%, remaining stable.  Her logs are to be scanned into her records.) An ACE inhibitor/angiotensin II receptor blocker is being taken. Eye exam is current.  Hyperlipidemia This is a chronic problem. The current episode started more than 1 year ago. The problem is controlled. Exacerbating diseases include diabetes. Pertinent negatives include no chest pain, myalgias or shortness of breath. Risk factors for coronary artery disease include dyslipidemia, diabetes mellitus, hypertension, a sedentary lifestyle and post-menopausal.  Hypertension This is a chronic problem. The problem is controlled. Pertinent negatives include no chest pain, headaches, palpitations or shortness of breath.  Risk factors for coronary artery disease  include dyslipidemia, diabetes mellitus, sedentary lifestyle, family history and post-menopausal state. Past treatments include ACE inhibitors. Hypertensive end-organ damage includes CAD/MI.    Review of systems: Limited as above.  Objective:    BP (!) 153/88   Pulse (!) 54   Ht 5\' 4"  (1.626 m)   Wt 171 lb (77.6 kg)   BMI 29.35 kg/m   Wt Readings from Last 3 Encounters:  07/20/19 171 lb (77.6 kg)  07/20/19 171 lb (77.6 kg)  01/28/19 167 lb 8 oz (76 kg)      Physical Exam- Limited  Constitutional:  Body mass index is 29.35 kg/m. , not in acute distress, normal state of mind Eyes:  EOMI, no exophthalmos Neck: Supple Thyroid: No gross goiter Respiratory: Adequate breathing efforts Musculoskeletal: no gross deformities, strength intact in all four extremities, no gross restriction of joint movements Skin:  no rashes, no hyperemia Neurological: no tremor with outstretched hands,    CMP     Component Value Date/Time   NA 137 01/25/2019 1055   NA 141 06/22/2018 1119   K 4.3 01/25/2019 1055   CL 108 01/25/2019 1055   CO2 22 01/25/2019 1055   GLUCOSE 86 01/25/2019 1055   BUN 14 04/27/2019 0000   CREATININE 0.8 04/27/2019 0000   CREATININE 0.95 01/25/2019 1055   CREATININE 0.94 10/18/2018 1000   CALCIUM 8.8 04/27/2019 0000   PROT 6.5 10/18/2018 1000   PROT 7.1 01/01/2018 0900   ALBUMIN 4.2 01/01/2018 0900   AST 20 10/18/2018 1000   ALT 13 10/18/2018 1000   ALKPHOS 92 01/01/2018 0900   BILITOT 0.4 10/18/2018 1000   BILITOT 0.3 01/01/2018 0900   GFRNONAA >60 01/25/2019 1055   GFRNONAA 64 10/18/2018 1000   GFRAA >60 01/25/2019 1055   GFRAA 74 10/18/2018 1000     Diabetic Labs (most recent): Lab Results  Component Value Date   HGBA1C 6.3 04/27/2019   HGBA1C 6.3 (H) 01/25/2019   HGBA1C 5.9 (H) 10/18/2018     Lipid Panel ( most recent) Lipid Panel     Component Value Date/Time   CHOL 162 04/27/2019 0000   CHOL 131 07/05/2018 1152   TRIG 48 04/27/2019 0000    HDL 89 (A) 04/27/2019 0000   HDL 83 07/05/2018 1152   CHOLHDL 1.6 07/05/2018 1152   CHOLHDL 1.8 04/15/2010 0400   VLDL 3 04/15/2010 0400   LDLCALC 63 04/27/2019 0000   LDLCALC 38 07/05/2018 1152      Lab Results  Component Value Date   TSH 1.16 04/27/2019   TSH 1.47 05/12/2018   TSH 1.540 01/01/2018   TSH 1.268 04/14/2010       Assessment & Plan:   1. DM type 2 causing vascular disease (HCC)   - Tamara Nunez has currently controlled  type 2 DM since  64 years of age. -She is not on any medication for diabetes at this time. She presents with tightly controlled glycemic profile both fasting and postprandial.  No major hypoglycemia nor hyperglycemia.  Her previsit labs show A1c of 6.3%, remaining stable.  Her logs are to be scanned into her records.   -her diabetes is complicated by coronary artery disease, sedentary life, and she remains at a high risk for more acute and chronic complications which include CAD, CVA, CKD, retinopathy, and neuropathy. These are all discussed in detail with her.  - I have counseled her on diet management and weight loss, by adopting a carbohydrate  restricted/protein rich diet.  - she  admits there is a room for improvement in her diet and drink choices. -  Suggestion is made for her to avoid simple carbohydrates  from her diet including Cakes, Sweet Desserts / Pastries, Ice Cream, Soda (diet and regular), Sweet Tea, Candies, Chips, Cookies, Sweet Pastries,  Store Bought Juices, Alcohol in Excess of  1-2 drinks a day, Artificial Sweeteners, Coffee Creamer, and "Sugar-free" Products. This will help patient to have stable blood glucose profile and potentially avoid unintended weight gain.   - I encouraged her to switch to  unprocessed or minimally processed complex starch and increased protein intake (animal or plant source), fruits, and vegetables.  - she is advised to stick to a routine mealtimes to eat 3 meals  a day and avoid unnecessary  snacks ( to snack only to correct hypoglycemia).  - I have approached her with the following individualized plan to manage  her diabetes and patient agrees:   -Her presentation with tightly controlled glycemic profile and stable A1c of 6.3% without any medications gives her a chance to stay off of medications at this time.   -She does have anemia for which she is being treated with iron supplements.    - Patient specific target  A1c;  LDL, HDL, Triglycerides, were discussed in detail.  2) Blood Pressure /Hypertension:  Her blood pressure slightly above target. -   she is advised to continue her current medications including  Ramipril 5 mg p.o. daily with breakfast .  3) Lipids/Hyperlipidemia:   Review of her recent lipid panel showed  controlled  LDL at 38 .  she  is advised to continue simvastatin 20 mg p.o. daily at bedtime.     4)  Weight/Diet: Her BMI is 28.45-She is not a candidate for major weight loss, I discussed with her the fact that loss of 5 - 10% of her  current body weight will have the most impact on her diabetes management.  CDE Consult will be initiated . Exercise, and detailed carbohydrates information provided  -  detailed on discharge instructions.  5) vitamin D deficiency: -She still has profound vitamin D deficiency.  She is advised to take vitamin D2 50,000 units weekly and vitamin D3 5000 units daily until next measurement.   6) Chronic Care/Health Maintenance:  -she  is on ACEI/ARB and Statin medications and  is encouraged to initiate and continue to follow up with Ophthalmology, Dentist,  Podiatrist at least yearly or according to recommendations, and advised to   stay away from smoking. I have recommended yearly flu vaccine and pneumonia vaccine at least every 5 years; moderate intensity exercise for up to 150 minutes weekly; and  sleep for at least 7 hours a day.  - she is  advised to maintain close follow up with Theodoro Kos, MD for primary care needs, as  well as her other providersfor optimal and coordinated care.   - Time spent on this patient care encounter:  35 min, of which > 50% was spent in  counseling and the rest reviewing her blood glucose logs , discussing her hypoglycemia and hyperglycemia episodes, reviewing her current and  previous labs / studies  ( including abstraction from other facilities) and medications  doses and developing a  long term treatment plan and documenting her care.   Please refer to Patient Instructions for Blood Glucose Monitoring and Insulin/Medications Dosing Guide"  in media tab for additional information. Please  also refer to " Patient  Self Inventory" in the Media  tab for reviewed elements of pertinent patient history.  Derwood KaplanWanda O Taliercio participated in the discussions, expressed understanding, and voiced agreement with the above plans.  All questions were answered to her satisfaction. she is encouraged to contact clinic should she have any questions or concerns prior to her return visit.    Follow up plan: - Return in about 6 months (around 01/20/2020) for F/U with Pre-visit Labs, NV A1c in Office.  Marquis LunchGebre Skyleigh Windle, MD [facility] [address] [place], [place] [address] Phone: [phone]  Fax: [phone]    07/20/2019, 7:41 PM  This note was partially dictated with voice recognition software. Similar sounding words can be transcribed inadequately or may not  be corrected upon review.

## 2019-07-20 NOTE — Patient Instructions (Signed)
Goals Follow My Plate Eat 30 g CHO at meals Increase protein  Increase fresh fruits and vegetables. Drink only water  Exercise by chair exercises Cut out processed high fat foods-spam, bologna, packaged snacks and foods.

## 2019-07-20 NOTE — Patient Instructions (Signed)

## 2019-08-03 ENCOUNTER — Ambulatory Visit (INDEPENDENT_AMBULATORY_CARE_PROVIDER_SITE_OTHER): Payer: Commercial Managed Care - PPO | Admitting: Physician Assistant

## 2019-08-03 ENCOUNTER — Other Ambulatory Visit: Payer: Self-pay

## 2019-08-03 ENCOUNTER — Encounter: Payer: Self-pay | Admitting: Physician Assistant

## 2019-08-03 VITALS — BP 126/70 | HR 61 | Temp 97.2°F | Ht 64.0 in | Wt 164.8 lb

## 2019-08-03 DIAGNOSIS — I25700 Atherosclerosis of coronary artery bypass graft(s), unspecified, with unstable angina pectoris: Secondary | ICD-10-CM | POA: Diagnosis not present

## 2019-08-03 DIAGNOSIS — E785 Hyperlipidemia, unspecified: Secondary | ICD-10-CM

## 2019-08-03 DIAGNOSIS — I200 Unstable angina: Secondary | ICD-10-CM | POA: Diagnosis not present

## 2019-08-03 DIAGNOSIS — Z01818 Encounter for other preprocedural examination: Secondary | ICD-10-CM

## 2019-08-03 DIAGNOSIS — I10 Essential (primary) hypertension: Secondary | ICD-10-CM | POA: Diagnosis not present

## 2019-08-03 DIAGNOSIS — E119 Type 2 diabetes mellitus without complications: Secondary | ICD-10-CM

## 2019-08-03 NOTE — Progress Notes (Signed)
Cardiology Office Note:    Date:  08/05/2019   ID:  Tamara Nunez, DOB 1955/12/24, MRN [id number]  PCP:  Theodoro Kos, MD  Cardiologist:  Chrystie Nose, MD  Electrophysiologist:  None   Referring MD: Theodoro Kos, MD   Chief Complaint  Patient presents with  . Follow-up    seen for Dr. Rennis Golden    History of Present Illness:    Tamara Nunez is a 64 y.o. female with a hx of HTN, HLD, DM 2, fibromyalgia and CAD s/p CABG 2006 with LIMA-LAD, SVG-distal LAD.  Repeat cardiac catheterization in 2009 showed widely patent grafts.  She has significant headache on Imdur.  She underwent cardiac catheterization by Dr. Herbie Baltimore in July 2018 demonstrating 80% proximal RCA lesion treated with 2.5 x 8 mm DES, she was also found to have atretic LIMA to LAD with 100% occlusion to mid LAD, SVG to distal LAD was normal.  Due to worsening fatigue and exertional symptoms, she underwent another cardiac catheterization on 06/25/2018, this showed 100% proximal to mid LAD occlusion, 70% ostial OM 2, patent left circumflex stent, 90% ostial D1 lesion treated with a 2.5 x 16 mm DES, EF 55 to 65%.  I last saw the patient in October 2020 at which time she was doing well.  She was cleared for back surgery at the time.  Patient presents today for cardiology office follow-up.  While on aspirin and Plavix, she does have significant lower extremity bruising.  Initially after the previous cardiac catheterization in April 2020, her exertional symptom improved significantly.  However in the past few months, her exertional chest discomfort, shortness of breath and fatigue has returned.  This is reminiscent of the previous angina.  She has no lower extremity edema, orthopnea or PND.  Her chest pain can occur both at rest and with exertion however more pronounced during exertion.  The only way she treated is through resting.  Her symptom was concerning, I discussed the case with DOD Dr. Allyson Sabal.  We reviewed her previous cath  films and previous work-up.  Prior to 2018 and 2020 PCI, she had negative stress test every single time.  Given her concerning symptoms, we recommend proceeding with outpatient cardiac catheterization.  Risk and benefit at of the procedure has been discussed with the patient who was agreeable to proceed.  If she indeed has another blockage, I would highly recommend switching from Plavix to Brilinta or Effient.  Past Medical History:  Diagnosis Date  . Arthritis    "in my back" (05/06/2017)  . CAD (coronary artery disease)    CABG 2006, 09/2016 DES RCA, 04/2017 DES Circ, 06/2018 DES 1st diag  . Chronic back pain   . Coronary artery disease    a. s/p CABG in 2006 with LIMA-LAD, SVG-dLAD. b. cath in 2009 showing patent grafts. c. 09/2016: cath with occluded LAD but patent SVG-dLAD. LIMA was atretic. pRCA with 80% stenosis --> treated with DES  . Fibromyalgia   . GERD (gastroesophageal reflux disease)   . Hyperlipidemia   . Hypertension   . Type II diabetes mellitus (HCC)     Past Surgical History:  Procedure Laterality Date  . ABDOMINAL HERNIA REPAIR  2018  . BACK SURGERY    . CARDIAC CATHETERIZATION  01/13/2008  . COLONOSCOPY    . CORONARY ANGIOPLASTY WITH STENT PLACEMENT  09/26/2016   Prox RCA lesion, 80 %stenosed. DES   . CORONARY ARTERY BYPASS GRAFT  12/23/2004   2 vessel -  LIMA to LAD (atretic), SVG to distal LAD  . CORONARY STENT INTERVENTION N/A 09/26/2016   Procedure: Coronary Stent Intervention;  Surgeon: Marykay Lex, MD;  [facility] INVAS[facility] CV LAB;  Service: Cardiovascular;  Laterality: N/A;  . CORONARY STENT INTERVENTION N/A 05/06/2017   Procedure: CORONARY STENT INTERVENTION;  Surgeon: Kathleene Hazel, MD;  Location: [facility] INVASIVE CV LAB;  Service: Cardiovascular;  Laterality: N/A;  . CORONARY STENT INTERVENTION N/A 06/25/2018   Procedure: CORONARY STENT INTERVENTION;  Surgeon: Swaziland, Peter M, MD;  Location: [facility] INVASIVE CV LAB;  Service: Cardiovascular;  Laterality:  N/A;  . DOPPLER ECHOCARDIOGRAPHY  04/12/2008   EF 60%  . GASTRIC BYPASS  2009  . HERNIA REPAIR    . LEFT HEART CATH AND CORONARY ANGIOGRAPHY N/A 05/06/2017   Procedure: LEFT HEART CATH AND CORONARY ANGIOGRAPHY;  Surgeon: Kathleene Hazel, MD;  Location: [facility] INVASIVE CV LAB;  Service: Cardiovascular;  Laterality: N/A;  . LEFT HEART CATH AND CORS/GRAFTS ANGIOGRAPHY N/A 09/26/2016   Procedure: Left Heart Cath and Cors/Grafts Angiography;  Surgeon: Marykay Lex, MD;  Location: [facility] INVASIVE CV LAB;  Service: Cardiovascular;  Laterality: N/A;  . LEFT HEART CATH AND CORS/GRAFTS ANGIOGRAPHY N/A 06/25/2018   Procedure: LEFT HEART CATH AND CORS/GRAFTS ANGIOGRAPHY;  Surgeon: Swaziland, Peter M, MD;  Location: [facility] INVASIVE CV LAB;  Service: Cardiovascular;  Laterality: N/A;  . NM MYOVIEW LTD  06/03/2012   EF 71%  . POSTERIOR FUSION LUMBAR SPINE  1990s  . TUBAL LIGATION    . VAGINAL HYSTERECTOMY     "partial"    Current Medications: Current Meds  Medication Sig  . acetaminophen (TYLENOL) 500 MG tablet Take 1,000 mg by mouth every 6 (six) hours as needed for moderate pain or headache.  . albuterol (PROVENTIL HFA;VENTOLIN HFA) 108 (90 BASE) MCG/ACT inhaler Inhale 2 puffs into the lungs every 6 (six) hours as needed for wheezing.  Marland Kitchen aspirin EC 81 MG tablet Take 81 mg by mouth daily.  . cetirizine (ZYRTEC) 10 MG tablet Take 10 mg by mouth daily.   . clopidogrel (PLAVIX) 75 MG tablet Take 75 mg by mouth daily.  Marland Kitchen CO-ENZYME Q-10 PO Take 200 mg by mouth daily.   . diclofenac sodium (VOLTAREN) 1 % GEL Apply 2 g topically 4 (four) times daily as needed (pain).   . Fingerstix Lancets MISC Inject into the skin.  . fluticasone (FLONASE) 50 MCG/ACT nasal spray Place 1 spray into both nostrils daily.   . furosemide (LASIX) 40 MG tablet Take 1 tablet (40 mg total) by mouth daily.  Marland Kitchen gabapentin (NEURONTIN) 600 MG tablet Take 600 mg by mouth 3 (three) times daily.   Marland Kitchen HYDROcodone-acetaminophen (NORCO/VICODIN)  5-325 MG tablet Take 1-2 tablets by mouth every 4 (four) hours as needed for moderate pain ((score 7 to 10)). (Patient not taking: Reported on 08/05/2019)  . isosorbide mononitrate (IMDUR) 60 MG 24 hr tablet TAKE 1 AND 1/2 TABLETS BY  MOUTH TWICE DAILY (Patient taking differently: Take 90 mg by mouth in the morning and at bedtime. )  . lidocaine (LIDODERM) 5 % Place 2-3 patches onto the skin daily as needed (pain). Remove & Discard patch within 12 hours or as directed by MD   . methocarbamol (ROBAXIN) 500 MG tablet Take 1 tablet (500 mg total) by mouth every 8 (eight) hours as needed for muscle spasms.  . metoprolol tartrate (LOPRESSOR) 25 MG tablet Take 25 mg by mouth 2 (two) times daily.  . nitroGLYCERIN (NITROLINGUAL) 0.4 MG/SPRAY  spray Place 1 spray under the tongue every 5 (five) minutes as needed for chest pain.  Tamara Nunez ULTRA test strip Test BG qid  . pantoprazole (PROTONIX) 40 MG tablet Take 40 mg by mouth daily as needed (acid reflux).   . polyethylene glycol powder (GLYCOLAX/MIRALAX) powder Take 17 g by mouth daily.   . ramipril (ALTACE) 5 MG capsule Take 5 mg by mouth at bedtime.   . ranolazine (RANEXA) 1000 MG SR tablet Take 1 tablet (1,000 mg total) by mouth 2 (two) times daily.  . simvastatin (ZOCOR) 20 MG tablet Take 20 mg by mouth at bedtime.  . vitamin B-12 (CYANOCOBALAMIN) 1000 MCG tablet Take 1,000 mcg by mouth daily.  . Vitamin D, Ergocalciferol, (DRISDOL) 1.25 MG (50000 UNIT) CAPS capsule Take 1 capsule (50,000 Units total) by mouth every 7 (seven) days.  . vitamin E 400 UNIT capsule Take 400 Units by mouth daily.  Marland Kitchen VITRON-C 65-125 MG TABS Take 1 tablet by mouth 2 (two) times daily.  . [DISCONTINUED] tiZANidine (ZANAFLEX) 4 MG tablet Take 4 mg by mouth every 8 (eight) hours as needed for muscle spasms.      Allergies:   Codeine, Other, Ciprofloxacin, Valsartan, Adhesive [tape], and Sulfa antibiotics   Social History   Socioeconomic History  . Marital status: Married     Spouse name: Not on file  . Number of children: Not on file  . Years of education: Not on file  . Highest education level: Not on file  Occupational History  . Not on file  Tobacco Use  . Smoking status: Never Smoker  . Smokeless tobacco: Never Used  Substance and Sexual Activity  . Alcohol use: No  . Drug use: No  . Sexual activity: Not on file  Other Topics Concern  . Not on file  Social History Narrative  . Not on file   Social Determinants of Health   Financial Resource Strain:   . Difficulty of Paying Living Expenses:   Food Insecurity:   . Worried About Charity fundraiser in the Last Year:   . Arboriculturist in the Last Year:   Transportation Needs:   . Film/video editor (Medical):   Marland Kitchen Lack of Transportation (Non-Medical):   Physical Activity:   . Days of Exercise per Week:   . Minutes of Exercise per Session:   Stress:   . Feeling of Stress :   Social Connections:   . Frequency of Communication with Friends and Family:   . Frequency of Social Gatherings with Friends and Family:   . Attends Religious Services:   . Active Member of Clubs or Organizations:   . Attends Archivist Meetings:   Marland Kitchen Marital Status:      Family History: The patient's family history includes Cancer - Lung in her father; Heart disease in her mother.  ROS:   Please see the history of present illness.     All other systems reviewed and are negative.  EKGs/Labs/Other Studies Reviewed:    The following studies were reviewed today:  Cath 06/25/2018  Prox LAD to Mid LAD lesion is 100% stenosed.  Ost 2nd Mrg lesion is 70% stenosed.  Prox Cx lesion is 20% stenosed.  Mid Cx lesion is 20% stenosed.  Previously placed Ost Cx drug eluting stent is widely patent.  Prox RCA lesion is 10% stenosed.  Prox RCA to Mid RCA lesion is 30% stenosed.  LIMA.  SVG and is normal in caliber.  Colon Flattery  1st Diag lesion is 90% stenosed.  Post intervention, there is a 0% residual  stenosis.  A drug-eluting stent was successfully placed using a STENT SYNERGY DES 2.5X16.  The left ventricular systolic function is normal.  LV end diastolic pressure is normal.  The left ventricular ejection fraction is 55-65% by visual estimate.   1. Single vessel obstructive CAD    - 100% mid LAD. Vessel supplied by SVG    - 90% proximal first diagonal. This is a new lesion and felt to be the culprit vessel 2. Continued patency of stents in the ostial LCx and proximal RCA 3. Normal LV function 4. Normal LVEDP 5. Successful PCI of the first diagonal with DES x 1.  Plan: DAPT for at least a year. Given long difficult procedure and groin access we will observe overnight. Anticipate DC in am.   EKG:  EKG is ordered today.  The ekg ordered today demonstrates normal sinus rhythm, right bundle branch block, occasional PACs.  Recent Labs: 10/18/2018: ALT 13 01/25/2019: Hemoglobin 9.8; Platelets 370; Potassium 4.3; Sodium 137 04/27/2019: BUN 14; Creatinine 0.8; TSH 1.16  Recent Lipid Panel    Component Value Date/Time   CHOL 162 04/27/2019 0000   CHOL 131 07/05/2018 1152   TRIG 48 04/27/2019 0000   HDL 89 (A) 04/27/2019 0000   HDL 83 07/05/2018 1152   CHOLHDL 1.6 07/05/2018 1152   CHOLHDL 1.8 04/15/2010 0400   VLDL 3 04/15/2010 0400   LDLCALC 63 04/27/2019 0000   LDLCALC 38 07/05/2018 1152    Physical Exam:    VS:  BP 126/70   Pulse 61   Temp (!) 97.2 F (36.2 C)   Ht  (1.626 m)   Wt 164 lb 12.8 oz (74.8 kg)   SpO2 99%   BMI 28.29 kg/m     Wt Readings from Last 3 Encounters:  08/03/19 164 lb 12.8 oz (74.8 kg)  07/20/19 171 lb (77.6 kg)  07/20/19 171 lb (77.6 kg)     GEN:  Well nourished, well developed in no acute distress HEENT: Normal NECK: No JVD; No carotid bruits LYMPHATICS: No lymphadenopathy CARDIAC: RRR, no murmurs, rubs, gallops RESPIRATORY:  Clear to auscultation without rales, wheezing or rhonchi  ABDOMEN: Soft, non-tender,  non-distended MUSCULOSKELETAL:  No edema; No deformity  SKIN: Warm and dry NEUROLOGIC:  Alert and oriented x 3 PSYCHIATRIC:  Normal affect   ASSESSMENT:    1. Progressive angina (HCC)   2. Pre-op evaluation   3. Coronary artery disease involving coronary bypass graft of native heart with unstable angina pectoris (HCC)   4. Essential hypertension   5. Hyperlipidemia LDL goal <70   6. Controlled type 2 diabetes mellitus without complication, without long-term current use of insulin (HCC)    PLAN:    In order of problems listed above:  1. Progressive angina: In the past several months, she has been having recurrent chest pain that is reminiscent of the previous angina.  I discussed the case with DOD Dr. Gery Pray, we recommend proceeding with cardiac catheterization.  Risk and benefit of the procedure has been explained to the patient who was agreeable to proceed  2. CAD s/p CABG: On aspirin and Plavix.  She has frequent cardiac catheterization.  Her last cardiac catheterization was in 2010.  If she does have new blockage, I would recommend switch Plavix to Brilinta  3. Hypertension: Blood pressure stable  4. Hyperlipidemia: Continue statin therapy  5. DM2: Managed by primary care provider.  Medication Adjustments/Labs and Tests Ordered: Current medicines are reviewed at length with the patient today.  Concerns regarding medicines are outlined above.  Orders Placed This Encounter  Procedures  . CBC  . Basic metabolic panel  . EKG 12-Lead   No orders of the defined types were placed in this encounter.   Patient Instructions  Medication Instructions:  Your physician recommends that you continue on your current medications as directed. Please refer to the Current Medication list given to you today.  *If you need a refill on your cardiac medications before your next appointment, please call your pharmacy*  Lab Work: Your physician recommends that you return for lab work on  Tuesday 08/09/2019:   BMET  CBC  You will also need to have a COVID test on Tuesday 08/09/2019. This test is done at [address]  If you have labs (blood work) drawn today and your tests are completely normal, you will receive your results only by: Marland Kitchen MyChart Message (if you have MyChart) OR . A paper copy in the mail If you have any lab test that is abnormal or we need to change your treatment, we will call you to review the results.  Testing/Procedures: Your physician has requested that you have a cardiac catheterization. Cardiac catheterization is used to diagnose and/or treat various heart conditions. Doctors may recommend this procedure for a number of different reasons. The most common reason is to evaluate chest pain. Chest pain can be a symptom of coronary artery disease (CAD), and cardiac catheterization can show whether plaque is narrowing or blocking your heart's arteries. This procedure is also used to evaluate the valves, as well as measure the blood flow and oxygen levels in different parts of your heart. For further information please visit [email or web address] Please follow instruction sheet, as given.  Follow-Up: At [facility], you and your health needs are our priority.  As part of our continuing mission to provide you with exceptional heart care, we have created designated Provider Care Teams.  These Care Teams include your primary Cardiologist (physician) and Advanced Practice Providers (APPs -  Physician Assistants and Nurse Practitioners) who all work together to provide you with the care you need, when you need it.  We recommend signing up for the patient portal called "MyChart".  Sign up information is provided on this After Visit Summary.  MyChart is used to connect with patients for Virtual Visits (Telemedicine).  Patients are able to view lab/test results, encounter notes, upcoming appointments, etc.  Non-urgent messages can be sent to your provider as well.    To learn more about what you can do with MyChart, go to [email or web address].    Your next appointment:   2 week(s)  The format for your next appointment:   In Person  Provider:   K. [place] Hilty, MD  Other Instructions         [facility] CARDIOVASCULAR DIVISION [facility] [address] [place] [address] Dept: [phone] Loc: [phone]  Tamara Nunez  08/03/2019  You are scheduled for a Cardiac Catheterization on Friday, June 4 with Dr. Verdis Prime.  1. Please arrive at the [facility] (Main Entrance A) at [facility]: [address] [place], [place] [address] at 5:30 AM (This time is two hours before your procedure to ensure your preparation). Free valet parking service is available.   Special note: Every effort is made to have your procedure done on time. Please  understand that emergencies sometimes delay scheduled procedures.  2. Diet: Do not eat solid foods after midnight.  The patient may have clear liquids until 5am upon the day of the procedure.  3. Labs: You will need to have blood drawn on Tuesday, June 7 at [facility] [address], [place]  Open: 8am - 5pm (Lunch 12:30 - 1:30)   Phone: [phone]. You do not need to be fasting.  4. Medication instructions in preparation for your procedure:   Contrast Allergy: No    Hold Lasix the morning of Cardiac Catheterization   On the morning of your procedure, take your Aspirin and any morning medicines NOT listed above.  You may use sips of water.  5. Plan for one night stay--bring personal belongings. 6. Bring a current list of your medications and current insurance cards. 7. You MUST have a responsible person to drive you home. 8. Someone MUST be with you the first 24 hours after you arrive home or your discharge will be delayed. 9. Please wear clothes that are easy to get on and off and wear slip-on shoes.  Thank you  for allowing us to care for you!   -- [facility] Invasive Cardiovascular services      Signed, Azalee CourseHao Alexus Galka, [place]  08/05/2019 11:09 PM    [facility]

## 2019-08-03 NOTE — Patient Instructions (Signed)
Medication Instructions:  Your physician recommends that you continue on your current medications as directed. Please refer to the Current Medication list given to you today.  *If you need a refill on your cardiac medications before your next appointment, please call your pharmacy*  Lab Work: Your physician recommends that you return for lab work on Tuesday 08/09/2019:   BMET  CBC  You will also need to have a COVID test on Tuesday 08/09/2019. This test is done at [facility].  If you have labs (blood work) drawn today and your tests are completely normal, you will receive your results only by: Marland Kitchen MyChart Message (if you have MyChart) OR . A paper copy in the mail If you have any lab test that is abnormal or we need to change your treatment, we will call you to review the results.  Testing/Procedures: Your physician has requested that you have a cardiac catheterization. Cardiac catheterization is used to diagnose and/or treat various heart conditions. Doctors may recommend this procedure for a number of different reasons. The most common reason is to evaluate chest pain. Chest pain can be a symptom of coronary artery disease (CAD), and cardiac catheterization can show whether plaque is narrowing or blocking your heart's arteries. This procedure is also used to evaluate the valves, as well as measure the blood flow and oxygen levels in different parts of your heart. For further information please visit [email or web address]. Please follow instruction sheet, as given.  Follow-Up: At [facility], you and your health needs are our priority.  As part of our continuing mission to provide you with exceptional heart care, we have created designated Provider Care Teams.  These Care Teams include your primary Cardiologist (physician) and Advanced Practice Providers (APPs -  Physician Assistants and Nurse Practitioners) who all work together to provide you with the care you need, when you need  it.  We recommend signing up for the patient portal called "MyChart".  Sign up information is provided on this After Visit Summary.  MyChart is used to connect with patients for Virtual Visits (Telemedicine).  Patients are able to view lab/test results, encounter notes, upcoming appointments, etc.  Non-urgent messages can be sent to your provider as well.   To learn more about what you can do with MyChart, go to [email or web address].    Your next appointment:   2 week(s)  The format for your next appointment:   In Person  Provider:   K. [place] Hilty, MD  Other Instructions         [place] [address] Dept: [place]: [place]  08/03/2019  You are scheduled for a Cardiac Catheterization on Friday, June 4 with Dr. Daneen Schick.  1. Please arrive at the [facility] (Main Entrance A) at [facility]: [address] [place], [place] [address] at 5:30 AM (This time is two hours before your procedure to ensure your preparation). Free valet parking service is available.   Special note: Every effort is made to have your procedure done on time. Please understand that emergencies sometimes delay scheduled procedures.  2. Diet: Do not eat solid foods after midnight.  The patient may have clear liquids until 5am upon the day of the procedure.  3. Labs: You will need to have blood drawn on Tuesday, June 7 at [place], [place]  Open: [employer]  12:30 - 1:30)   Phone: [phone]. You do not need to be fasting.  4. Medication instructions in preparation for your procedure:   Contrast Allergy: No    Hold Lasix the morning of Cardiac Catheterization   On the morning of your procedure, take your Aspirin and any morning medicines NOT listed above.  You may use sips of water.  5. Plan for one night  stay--bring personal belongings. 6. Bring a current list of your medications and current insurance cards. 7. You MUST have a responsible person to drive you home. 8. Someone MUST be with you the first 24 hours after you arrive home or your discharge will be delayed. 9. Please wear clothes that are easy to get on and off and wear slip-on shoes.  Thank you for allowing [place] to care for you!   -- [place] Invasive Cardiovascular services

## 2019-08-03 NOTE — H&P (View-Only) (Signed)
Cardiology Office Note:    Date:  08/05/2019   ID:  Tamara Nunez, DOB 1955/12/24, MRN [id number]  PCP:  Theodoro Kos, MD  Cardiologist:  Chrystie Nose, MD  Electrophysiologist:  None   Referring MD: Theodoro Kos, MD   Chief Complaint  Patient presents with  . Follow-up    seen for Dr. Rennis Golden    History of Present Illness:    Tamara Nunez is a 64 y.o. female with a hx of HTN, HLD, DM 2, fibromyalgia and CAD s/p CABG 2006 with LIMA-LAD, SVG-distal LAD.  Repeat cardiac catheterization in 2009 showed widely patent grafts.  She has significant headache on Imdur.  She underwent cardiac catheterization by Dr. Herbie Baltimore in July 2018 demonstrating 80% proximal RCA lesion treated with 2.5 x 8 mm DES, she was also found to have atretic LIMA to LAD with 100% occlusion to mid LAD, SVG to distal LAD was normal.  Due to worsening fatigue and exertional symptoms, she underwent another cardiac catheterization on 06/25/2018, this showed 100% proximal to mid LAD occlusion, 70% ostial OM 2, patent left circumflex stent, 90% ostial D1 lesion treated with a 2.5 x 16 mm DES, EF 55 to 65%.  I last saw the patient in October 2020 at which time she was doing well.  She was cleared for back surgery at the time.  Patient presents today for cardiology office follow-up.  While on aspirin and Plavix, she does have significant lower extremity bruising.  Initially after the previous cardiac catheterization in April 2020, her exertional symptom improved significantly.  However in the past few months, her exertional chest discomfort, shortness of breath and fatigue has returned.  This is reminiscent of the previous angina.  She has no lower extremity edema, orthopnea or PND.  Her chest pain can occur both at rest and with exertion however more pronounced during exertion.  The only way she treated is through resting.  Her symptom was concerning, I discussed the case with DOD Dr. Allyson Sabal.  We reviewed her previous cath  films and previous work-up.  Prior to 2018 and 2020 PCI, she had negative stress test every single time.  Given her concerning symptoms, we recommend proceeding with outpatient cardiac catheterization.  Risk and benefit at of the procedure has been discussed with the patient who was agreeable to proceed.  If she indeed has another blockage, I would highly recommend switching from Plavix to Brilinta or Effient.  Past Medical History:  Diagnosis Date  . Arthritis    "in my back" (05/06/2017)  . CAD (coronary artery disease)    CABG 2006, 09/2016 DES RCA, 04/2017 DES Circ, 06/2018 DES 1st diag  . Chronic back pain   . Coronary artery disease    a. s/p CABG in 2006 with LIMA-LAD, SVG-dLAD. b. cath in 2009 showing patent grafts. c. 09/2016: cath with occluded LAD but patent SVG-dLAD. LIMA was atretic. pRCA with 80% stenosis --> treated with DES  . Fibromyalgia   . GERD (gastroesophageal reflux disease)   . Hyperlipidemia   . Hypertension   . Type II diabetes mellitus (HCC)     Past Surgical History:  Procedure Laterality Date  . ABDOMINAL HERNIA REPAIR  2018  . BACK SURGERY    . CARDIAC CATHETERIZATION  01/13/2008  . COLONOSCOPY    . CORONARY ANGIOPLASTY WITH STENT PLACEMENT  09/26/2016   Prox RCA lesion, 80 %stenosed. DES   . CORONARY ARTERY BYPASS GRAFT  12/23/2004   2 vessel -  LIMA to LAD (atretic), SVG to distal LAD  . CORONARY STENT INTERVENTION N/A 09/26/2016   Procedure: Coronary Stent Intervention;  Surgeon: Marykay Lex, MD;  [facility] INVAS[facility] CV LAB;  Service: Cardiovascular;  Laterality: N/A;  . CORONARY STENT INTERVENTION N/A 05/06/2017   Procedure: CORONARY STENT INTERVENTION;  Surgeon: Kathleene Hazel, MD;  Location: [facility] INVASIVE CV LAB;  Service: Cardiovascular;  Laterality: N/A;  . CORONARY STENT INTERVENTION N/A 06/25/2018   Procedure: CORONARY STENT INTERVENTION;  Surgeon: Swaziland, Peter M, MD;  Location: [facility] INVASIVE CV LAB;  Service: Cardiovascular;  Laterality:  N/A;  . DOPPLER ECHOCARDIOGRAPHY  04/12/2008   EF 60%  . GASTRIC BYPASS  2009  . HERNIA REPAIR    . LEFT HEART CATH AND CORONARY ANGIOGRAPHY N/A 05/06/2017   Procedure: LEFT HEART CATH AND CORONARY ANGIOGRAPHY;  Surgeon: Kathleene Hazel, MD;  Location: [facility] INVASIVE CV LAB;  Service: Cardiovascular;  Laterality: N/A;  . LEFT HEART CATH AND CORS/GRAFTS ANGIOGRAPHY N/A 09/26/2016   Procedure: Left Heart Cath and Cors/Grafts Angiography;  Surgeon: Marykay Lex, MD;  Location: [facility] INVASIVE CV LAB;  Service: Cardiovascular;  Laterality: N/A;  . LEFT HEART CATH AND CORS/GRAFTS ANGIOGRAPHY N/A 06/25/2018   Procedure: LEFT HEART CATH AND CORS/GRAFTS ANGIOGRAPHY;  Surgeon: Swaziland, Peter M, MD;  Location: [facility] INVASIVE CV LAB;  Service: Cardiovascular;  Laterality: N/A;  . NM MYOVIEW LTD  06/03/2012   EF 71%  . POSTERIOR FUSION LUMBAR SPINE  1990s  . TUBAL LIGATION    . VAGINAL HYSTERECTOMY     "partial"    Current Medications: Current Meds  Medication Sig  . acetaminophen (TYLENOL) 500 MG tablet Take 1,000 mg by mouth every 6 (six) hours as needed for moderate pain or headache.  . albuterol (PROVENTIL HFA;VENTOLIN HFA) 108 (90 BASE) MCG/ACT inhaler Inhale 2 puffs into the lungs every 6 (six) hours as needed for wheezing.  Marland Kitchen aspirin EC 81 MG tablet Take 81 mg by mouth daily.  . cetirizine (ZYRTEC) 10 MG tablet Take 10 mg by mouth daily.   . clopidogrel (PLAVIX) 75 MG tablet Take 75 mg by mouth daily.  Marland Kitchen CO-ENZYME Q-10 PO Take 200 mg by mouth daily.   . diclofenac sodium (VOLTAREN) 1 % GEL Apply 2 g topically 4 (four) times daily as needed (pain).   . Fingerstix Lancets MISC Inject into the skin.  . fluticasone (FLONASE) 50 MCG/ACT nasal spray Place 1 spray into both nostrils daily.   . furosemide (LASIX) 40 MG tablet Take 1 tablet (40 mg total) by mouth daily.  Marland Kitchen gabapentin (NEURONTIN) 600 MG tablet Take 600 mg by mouth 3 (three) times daily.   Marland Kitchen HYDROcodone-acetaminophen (NORCO/VICODIN)  5-325 MG tablet Take 1-2 tablets by mouth every 4 (four) hours as needed for moderate pain ((score 7 to 10)). (Patient not taking: Reported on 08/05/2019)  . isosorbide mononitrate (IMDUR) 60 MG 24 hr tablet TAKE 1 AND 1/2 TABLETS BY  MOUTH TWICE DAILY (Patient taking differently: Take 90 mg by mouth in the morning and at bedtime. )  . lidocaine (LIDODERM) 5 % Place 2-3 patches onto the skin daily as needed (pain). Remove & Discard patch within 12 hours or as directed by MD   . methocarbamol (ROBAXIN) 500 MG tablet Take 1 tablet (500 mg total) by mouth every 8 (eight) hours as needed for muscle spasms.  . metoprolol tartrate (LOPRESSOR) 25 MG tablet Take 25 mg by mouth 2 (two) times daily.  . nitroGLYCERIN (NITROLINGUAL) 0.4 MG/SPRAY  spray Place 1 spray under the tongue every 5 (five) minutes as needed for chest pain.  Glory Rosebush ULTRA test strip Test BG qid  . pantoprazole (PROTONIX) 40 MG tablet Take 40 mg by mouth daily as needed (acid reflux).   . polyethylene glycol powder (GLYCOLAX/MIRALAX) powder Take 17 g by mouth daily.   . ramipril (ALTACE) 5 MG capsule Take 5 mg by mouth at bedtime.   . ranolazine (RANEXA) 1000 MG SR tablet Take 1 tablet (1,000 mg total) by mouth 2 (two) times daily.  . simvastatin (ZOCOR) 20 MG tablet Take 20 mg by mouth at bedtime.  . vitamin B-12 (CYANOCOBALAMIN) 1000 MCG tablet Take 1,000 mcg by mouth daily.  . Vitamin D, Ergocalciferol, (DRISDOL) 1.25 MG (50000 UNIT) CAPS capsule Take 1 capsule (50,000 Units total) by mouth every 7 (seven) days.  . vitamin E 400 UNIT capsule Take 400 Units by mouth daily.  Marland Kitchen VITRON-C 65-125 MG TABS Take 1 tablet by mouth 2 (two) times daily.  . [DISCONTINUED] tiZANidine (ZANAFLEX) 4 MG tablet Take 4 mg by mouth every 8 (eight) hours as needed for muscle spasms.      Allergies:   Codeine, Other, Ciprofloxacin, Valsartan, Adhesive [tape], and Sulfa antibiotics   Social History   Socioeconomic History  . Marital status: Married     Spouse name: Not on file  . Number of children: Not on file  . Years of education: Not on file  . Highest education level: Not on file  Occupational History  . Not on file  Tobacco Use  . Smoking status: Never Smoker  . Smokeless tobacco: Never Used  Substance and Sexual Activity  . Alcohol use: No  . Drug use: No  . Sexual activity: Not on file  Other Topics Concern  . Not on file  Social History Narrative  . Not on file   Social Determinants of Health   Financial Resource Strain:   . Difficulty of Paying Living Expenses:   Food Insecurity:   . Worried About Charity fundraiser in the Last Year:   . Arboriculturist in the Last Year:   Transportation Needs:   . Film/video editor (Medical):   Marland Kitchen Lack of Transportation (Non-Medical):   Physical Activity:   . Days of Exercise per Week:   . Minutes of Exercise per Session:   Stress:   . Feeling of Stress :   Social Connections:   . Frequency of Communication with Friends and Family:   . Frequency of Social Gatherings with Friends and Family:   . Attends Religious Services:   . Active Member of Clubs or Organizations:   . Attends Archivist Meetings:   Marland Kitchen Marital Status:      Family History: The patient's family history includes Cancer - Lung in her father; Heart disease in her mother.  ROS:   Please see the history of present illness.     All other systems reviewed and are negative.  EKGs/Labs/Other Studies Reviewed:    The following studies were reviewed today:  Cath 06/25/2018  Prox LAD to Mid LAD lesion is 100% stenosed.  Ost 2nd Mrg lesion is 70% stenosed.  Prox Cx lesion is 20% stenosed.  Mid Cx lesion is 20% stenosed.  Previously placed Ost Cx drug eluting stent is widely patent.  Prox RCA lesion is 10% stenosed.  Prox RCA to Mid RCA lesion is 30% stenosed.  LIMA.  SVG and is normal in caliber.  Colon Flattery  1st Diag lesion is 90% stenosed.  Post intervention, there is a 0% residual  stenosis.  A drug-eluting stent was successfully placed using a STENT SYNERGY DES 2.5X16.  The left ventricular systolic function is normal.  LV end diastolic pressure is normal.  The left ventricular ejection fraction is 55-65% by visual estimate.   1. Single vessel obstructive CAD    - 100% mid LAD. Vessel supplied by SVG    - 90% proximal first diagonal. This is a new lesion and felt to be the culprit vessel 2. Continued patency of stents in the ostial LCx and proximal RCA 3. Normal LV function 4. Normal LVEDP 5. Successful PCI of the first diagonal with DES x 1.  Plan: DAPT for at least a year. Given long difficult procedure and groin access we will observe overnight. Anticipate DC in am.   EKG:  EKG is ordered today.  The ekg ordered today demonstrates normal sinus rhythm, right bundle branch block, occasional PACs.  Recent Labs: 10/18/2018: ALT 13 01/25/2019: Hemoglobin 9.8; Platelets 370; Potassium 4.3; Sodium 137 04/27/2019: BUN 14; Creatinine 0.8; TSH 1.16  Recent Lipid Panel    Component Value Date/Time   CHOL 162 04/27/2019 0000   CHOL 131 07/05/2018 1152   TRIG 48 04/27/2019 0000   HDL 89 (A) 04/27/2019 0000   HDL 83 07/05/2018 1152   CHOLHDL 1.6 07/05/2018 1152   CHOLHDL 1.8 04/15/2010 0400   VLDL 3 04/15/2010 0400   LDLCALC 63 04/27/2019 0000   LDLCALC 38 07/05/2018 1152    Physical Exam:    VS:  BP 126/70   Pulse 61   Temp (!) 97.2 F (36.2 C)   Ht 5' 4" (1.626 m)   Wt 164 lb 12.8 oz (74.8 kg)   SpO2 99%   BMI 28.29 kg/m     Wt Readings from Last 3 Encounters:  08/03/19 164 lb 12.8 oz (74.8 kg)  07/20/19 171 lb (77.6 kg)  07/20/19 171 lb (77.6 kg)     GEN:  Well nourished, well developed in no acute distress HEENT: Normal NECK: No JVD; No carotid bruits LYMPHATICS: No lymphadenopathy CARDIAC: RRR, no murmurs, rubs, gallops RESPIRATORY:  Clear to auscultation without rales, wheezing or rhonchi  ABDOMEN: Soft, non-tender,  non-distended MUSCULOSKELETAL:  No edema; No deformity  SKIN: Warm and dry NEUROLOGIC:  Alert and oriented x 3 PSYCHIATRIC:  Normal affect   ASSESSMENT:    1. Progressive angina (HCC)   2. Pre-op evaluation   3. Coronary artery disease involving coronary bypass graft of native heart with unstable angina pectoris (HCC)   4. Essential hypertension   5. Hyperlipidemia LDL goal <70   6. Controlled type 2 diabetes mellitus without complication, without long-term current use of insulin (HCC)    PLAN:    In order of problems listed above:  1. Progressive angina: In the past several months, she has been having recurrent chest pain that is reminiscent of the previous angina.  I discussed the case with DOD Dr. Barry, we recommend proceeding with cardiac catheterization.  Risk and benefit of the procedure has been explained to the patient who was agreeable to proceed  2. CAD s/p CABG: On aspirin and Plavix.  She has frequent cardiac catheterization.  Her last cardiac catheterization was in 2010.  If she does have new blockage, I would recommend switch Plavix to Brilinta  3. Hypertension: Blood pressure stable  4. Hyperlipidemia: Continue statin therapy  5. DM2: Managed by primary care provider.     Medication Adjustments/Labs and Tests Ordered: Current medicines are reviewed at length with the patient today.  Concerns regarding medicines are outlined above.  Orders Placed This Encounter  Procedures  . CBC  . Basic metabolic panel  . EKG 12-Lead   No orders of the defined types were placed in this encounter.   Patient Instructions  Medication Instructions:  Your physician recommends that you continue on your current medications as directed. Please refer to the Current Medication list given to you today.  *If you need a refill on your cardiac medications before your next appointment, please call your pharmacy*  Lab Work: Your physician recommends that you return for lab work on  Tuesday 08/09/2019:   BMET  CBC  You will also need to have a COVID test on Tuesday 08/09/2019. This test is done at [address]  If you have labs (blood work) drawn today and your tests are completely normal, you will receive your results only by: Marland Kitchen MyChart Message (if you have MyChart) OR . A paper copy in the mail If you have any lab test that is abnormal or we need to change your treatment, we will call you to review the results.  Testing/Procedures: Your physician has requested that you have a cardiac catheterization. Cardiac catheterization is used to diagnose and/or treat various heart conditions. Doctors may recommend this procedure for a number of different reasons. The most common reason is to evaluate chest pain. Chest pain can be a symptom of coronary artery disease (CAD), and cardiac catheterization can show whether plaque is narrowing or blocking your heart's arteries. This procedure is also used to evaluate the valves, as well as measure the blood flow and oxygen levels in different parts of your heart. For further information please visit [email or web address] Please follow instruction sheet, as given.  Follow-Up: At [facility], you and your health needs are our priority.  As part of our continuing mission to provide you with exceptional heart care, we have created designated Provider Care Teams.  These Care Teams include your primary Cardiologist (physician) and Advanced Practice Providers (APPs -  Physician Assistants and Nurse Practitioners) who all work together to provide you with the care you need, when you need it.  We recommend signing up for the patient portal called "MyChart".  Sign up information is provided on this After Visit Summary.  MyChart is used to connect with patients for Virtual Visits (Telemedicine).  Patients are able to view lab/test results, encounter notes, upcoming appointments, etc.  Non-urgent messages can be sent to your provider as well.    To learn more about what you can do with MyChart, go to [email or web address].    Your next appointment:   2 week(s)  The format for your next appointment:   In Person  Provider:   K. Italy Hilty, MD  Other Instructions         [facility] CARDIOVASCULAR DIVISION [facility] [address] [place] [address] Dept: [phone] Loc: [phone]  Tamara Nunez  08/03/2019  You are scheduled for a Cardiac Catheterization on Friday, June 4 with Dr. Verdis Prime.  1. Please arrive at the [facility] (Main Entrance A) at [facility]: [address] [place], [place] [address] at 5:30 AM (This time is two hours before your procedure to ensure your preparation). Free valet parking service is available.   Special note: Every effort is made to have your procedure done on time. Please  understand that emergencies sometimes delay scheduled procedures.  2. Diet: Do not eat solid foods after midnight.  The patient may have clear liquids until 5am upon the day of the procedure.  3. Labs: You will need to have blood drawn on Tuesday, June 7 at [facility] [address], [place]  Open: 8am - 5pm (Lunch 12:30 - 1:30)   Phone: [phone]. You do not need to be fasting.  4. Medication instructions in preparation for your procedure:   Contrast Allergy: No    Hold Lasix the morning of Cardiac Catheterization   On the morning of your procedure, take your Aspirin and any morning medicines NOT listed above.  You may use sips of water.  5. Plan for one night stay--bring personal belongings. 6. Bring a current list of your medications and current insurance cards. 7. You MUST have a responsible person to drive you home. 8. Someone MUST be with you the first 24 hours after you arrive home or your discharge will be delayed. 9. Please wear clothes that are easy to get on and off and wear slip-on shoes.  Thank you  for allowing us to care for you!   -- [facility] Invasive Cardiovascular services      Signed, Azalee CourseHao Jahnai [place], [place]  08/05/2019 11:09 PM    [facility]

## 2019-08-05 ENCOUNTER — Encounter: Payer: Self-pay | Admitting: Physician Assistant

## 2019-08-08 ENCOUNTER — Other Ambulatory Visit: Payer: Self-pay | Admitting: Physician Assistant

## 2019-08-08 MED ORDER — SODIUM CHLORIDE 0.9% FLUSH
3.0000 mL | Freq: Two times a day (BID) | INTRAVENOUS | Status: DC
Start: 2019-08-08 — End: 2019-08-17

## 2019-08-09 ENCOUNTER — Other Ambulatory Visit (HOSPITAL_COMMUNITY)
Admission: RE | Admit: 2019-08-09 | Discharge: 2019-08-09 | Disposition: A | Discharge status: Home / Self Care | Payer: Commercial Managed Care - PPO | Source: Ambulatory Visit | Attending: Interventional Cardiology | Admitting: Interventional Cardiology

## 2019-08-09 DIAGNOSIS — Z20822 Contact with and (suspected) exposure to covid-19: Secondary | ICD-10-CM | POA: Insufficient documentation

## 2019-08-09 DIAGNOSIS — Z01812 Encounter for preprocedural laboratory examination: Secondary | ICD-10-CM | POA: Diagnosis not present

## 2019-08-09 LAB — BASIC METABOLIC PANEL
BUN/Creatinine Ratio: 21 (ref 12–28)
BUN: 18 mg/dL (ref 8–27)
CO2: 21 mmol/L (ref 20–29)
Calcium: 9.3 mg/dL (ref 8.7–10.3)
Chloride: 106 mmol/L (ref 96–106)
Creatinine, Ser: 0.85 mg/dL (ref 0.57–1.00)
GFR calc Af Amer: 83 mL/min/{1.73_m2} (ref >59)
GFR calc non Af Amer: 72 mL/min/{1.73_m2} (ref >59)
Glucose: 59 mg/dL — ABNORMAL LOW (ref 65–99)
Potassium: 5 mmol/L (ref 3.5–5.2)
Sodium: 141 mmol/L (ref 134–144)

## 2019-08-09 LAB — CBC
Hematocrit: 32.9 % — ABNORMAL LOW (ref 34.0–46.6)
Hemoglobin: 9.8 g/dL — ABNORMAL LOW (ref 11.1–15.9)
MCH: 22.4 pg — ABNORMAL LOW (ref 26.6–33.0)
MCHC: 29.8 g/dL — ABNORMAL LOW (ref 31.5–35.7)
MCV: 75 fL — ABNORMAL LOW (ref 79–97)
Platelets: 361 10*3/uL (ref 150–450)
RBC: 4.37 x10E6/uL (ref 3.77–5.28)
RDW: 21.4 % — ABNORMAL HIGH (ref 11.7–15.4)
WBC: 5.5 10*3/uL (ref 3.4–10.8)

## 2019-08-09 LAB — SARS CORONAVIRUS 2 (TAT 6-24 HRS): SARS Coronavirus 2: NEGATIVE

## 2019-08-10 ENCOUNTER — Telehealth: Payer: Self-pay

## 2019-08-10 NOTE — Telephone Encounter (Signed)
Pt contacted pre-catheterization scheduled at [facility] for: 08/12/19. Verified arrival time and place: [facility] Main Entrance A [facility]) at:5:30 am.    No solid food after midnight prior to cath, clear liquids until 5 AM day of procedure. Contrast allergy:  DO NOT TAKE ANY DIURETICS THE MORNING OF THE PROCEDURE.  AM meds can be  taken pre-cath with sip of water including: ASA 81 mg   Confirmed patient has responsible adult to drive home post procedure and observe 24 hours after arriving home:   You are allowed ONE visitor in the waiting room during your procedure. Both you and your visitor must wear masks.      COVID-19 Pre-Screening Questions:  . In the past 7 to 10 days have you had a cough,  shortness of breath, headache, congestion, fever (100 or greater) body aches, chills, sore throat, or sudden loss of taste or sense of smell? NO . Have you been around anyone with known Covid 19 in the past 7 to 10 days? NO . Have you been around anyone who is awaiting Covid 19 test results in the past 7 to 10 days? NO Have you been around anyone who has mentioned symptoms of Covid 19 within the past 7 to 10 days? NO

## 2019-08-11 NOTE — H&P (Signed)
Recurrent angina, atretic LIMA, patent SVG diag, occluded LAD, Diag DES, OM DES  Right radial approach

## 2019-08-12 ENCOUNTER — Inpatient Hospital Stay (HOSPITAL_COMMUNITY)
Admission: AD | Admit: 2019-08-12 | Discharge: 2019-08-17 | DRG: 246 | Disposition: A | Discharge status: Home / Self Care | Payer: Commercial Managed Care - PPO | Source: Ambulatory Visit | Attending: Interventional Cardiology | Admitting: Interventional Cardiology

## 2019-08-12 ENCOUNTER — Inpatient Hospital Stay (HOSPITAL_COMMUNITY)
Admission: AD | Disposition: A | Discharge status: Home / Self Care | Payer: Commercial Managed Care - PPO | Source: Ambulatory Visit | Attending: Interventional Cardiology

## 2019-08-12 DIAGNOSIS — Z7902 Long term (current) use of antithrombotics/antiplatelets: Secondary | ICD-10-CM

## 2019-08-12 DIAGNOSIS — T82855A Stenosis of coronary artery stent, initial encounter: Secondary | ICD-10-CM | POA: Diagnosis not present

## 2019-08-12 DIAGNOSIS — E1159 Type 2 diabetes mellitus with other circulatory complications: Secondary | ICD-10-CM | POA: Diagnosis present

## 2019-08-12 DIAGNOSIS — Z79899 Other long term (current) drug therapy: Secondary | ICD-10-CM

## 2019-08-12 DIAGNOSIS — I4510 Unspecified right bundle-branch block: Secondary | ICD-10-CM | POA: Diagnosis present

## 2019-08-12 DIAGNOSIS — I200 Unstable angina: Secondary | ICD-10-CM

## 2019-08-12 DIAGNOSIS — I2582 Chronic total occlusion of coronary artery: Secondary | ICD-10-CM | POA: Diagnosis present

## 2019-08-12 DIAGNOSIS — I25710 Atherosclerosis of autologous vein coronary artery bypass graft(s) with unstable angina pectoris: Secondary | ICD-10-CM | POA: Diagnosis not present

## 2019-08-12 DIAGNOSIS — D638 Anemia in other chronic diseases classified elsewhere: Secondary | ICD-10-CM | POA: Diagnosis present

## 2019-08-12 DIAGNOSIS — Z91048 Other nonmedicinal substance allergy status: Secondary | ICD-10-CM

## 2019-08-12 DIAGNOSIS — Z882 Allergy status to sulfonamides status: Secondary | ICD-10-CM

## 2019-08-12 DIAGNOSIS — I97190 Other postprocedural cardiac functional disturbances following cardiac surgery: Principal | ICD-10-CM | POA: Diagnosis present

## 2019-08-12 DIAGNOSIS — Z885 Allergy status to narcotic agent status: Secondary | ICD-10-CM

## 2019-08-12 DIAGNOSIS — Z955 Presence of coronary angioplasty implant and graft: Secondary | ICD-10-CM

## 2019-08-12 DIAGNOSIS — Y831 Surgical operation with implant of artificial internal device as the cause of abnormal reaction of the patient, or of later complication, without mention of misadventure at the time of the procedure: Secondary | ICD-10-CM | POA: Diagnosis not present

## 2019-08-12 DIAGNOSIS — Z888 Allergy status to other drugs, medicaments and biological substances status: Secondary | ICD-10-CM

## 2019-08-12 DIAGNOSIS — Z91018 Allergy to other foods: Secondary | ICD-10-CM

## 2019-08-12 DIAGNOSIS — I2510 Atherosclerotic heart disease of native coronary artery without angina pectoris: Secondary | ICD-10-CM | POA: Diagnosis present

## 2019-08-12 DIAGNOSIS — M797 Fibromyalgia: Secondary | ICD-10-CM | POA: Diagnosis present

## 2019-08-12 DIAGNOSIS — G8929 Other chronic pain: Secondary | ICD-10-CM | POA: Diagnosis present

## 2019-08-12 DIAGNOSIS — K219 Gastro-esophageal reflux disease without esophagitis: Secondary | ICD-10-CM | POA: Diagnosis present

## 2019-08-12 DIAGNOSIS — Z90711 Acquired absence of uterus with remaining cervical stump: Secondary | ICD-10-CM

## 2019-08-12 DIAGNOSIS — I21A9 Other myocardial infarction type: Secondary | ICD-10-CM | POA: Diagnosis not present

## 2019-08-12 DIAGNOSIS — Z7982 Long term (current) use of aspirin: Secondary | ICD-10-CM

## 2019-08-12 DIAGNOSIS — Z951 Presence of aortocoronary bypass graft: Secondary | ICD-10-CM

## 2019-08-12 DIAGNOSIS — I10 Essential (primary) hypertension: Secondary | ICD-10-CM | POA: Diagnosis present

## 2019-08-12 DIAGNOSIS — I25110 Atherosclerotic heart disease of native coronary artery with unstable angina pectoris: Secondary | ICD-10-CM

## 2019-08-12 DIAGNOSIS — E11649 Type 2 diabetes mellitus with hypoglycemia without coma: Secondary | ICD-10-CM | POA: Diagnosis present

## 2019-08-12 DIAGNOSIS — D649 Anemia, unspecified: Secondary | ICD-10-CM

## 2019-08-12 DIAGNOSIS — Z881 Allergy status to other antibiotic agents status: Secondary | ICD-10-CM

## 2019-08-12 DIAGNOSIS — Z981 Arthrodesis status: Secondary | ICD-10-CM

## 2019-08-12 DIAGNOSIS — E782 Mixed hyperlipidemia: Secondary | ICD-10-CM | POA: Diagnosis present

## 2019-08-12 DIAGNOSIS — Z8249 Family history of ischemic heart disease and other diseases of the circulatory system: Secondary | ICD-10-CM

## 2019-08-12 DIAGNOSIS — Z9884 Bariatric surgery status: Secondary | ICD-10-CM

## 2019-08-12 HISTORY — PX: LEFT HEART CATH AND CORS/GRAFTS ANGIOGRAPHY: CATH118250

## 2019-08-12 HISTORY — PX: CORONARY STENT INTERVENTION: CATH118234

## 2019-08-12 LAB — GLUCOSE, CAPILLARY: Glucose-Capillary: 76 mg/dL (ref 70–99)

## 2019-08-12 LAB — CREATININE, SERUM
Creatinine, Ser: 0.88 mg/dL (ref 0.44–1.00)
GFR calc Af Amer: >60 mL/min (ref >60)
GFR calc non Af Amer: >60 mL/min (ref >60)

## 2019-08-12 LAB — POCT ACTIVATED CLOTTING TIME
Activated Clotting Time: 274 seconds
Activated Clotting Time: 301 seconds

## 2019-08-12 LAB — CBC
HCT: 31.5 % — ABNORMAL LOW (ref 36.0–46.0)
Hemoglobin: 9.1 g/dL — ABNORMAL LOW (ref 12.0–15.0)
MCH: 22.1 pg — ABNORMAL LOW (ref 26.0–34.0)
MCHC: 28.9 g/dL — ABNORMAL LOW (ref 30.0–36.0)
MCV: 76.5 fL — ABNORMAL LOW (ref 80.0–100.0)
Platelets: 345 10*3/uL (ref 150–400)
RBC: 4.12 MIL/uL (ref 3.87–5.11)
RDW: 23.8 % — ABNORMAL HIGH (ref 11.5–15.5)
WBC: 5.4 10*3/uL (ref 4.0–10.5)
nRBC: 0 % (ref 0.0–0.2)

## 2019-08-12 SURGERY — LEFT HEART CATH AND CORS/GRAFTS ANGIOGRAPHY
Anesthesia: LOCAL

## 2019-08-12 MED ORDER — MIDAZOLAM HCL 2 MG/2ML IJ SOLN
INTRAMUSCULAR | Status: AC
  Filled 2019-08-12: qty 2

## 2019-08-12 MED ORDER — CLOPIDOGREL BISULFATE 300 MG PO TABS
ORAL_TABLET | ORAL | Status: AC
  Filled 2019-08-12: qty 1

## 2019-08-12 MED ORDER — RAMIPRIL 5 MG PO CAPS
5.0000 mg | ORAL_CAPSULE | Freq: Every day | ORAL | Status: DC
  Administered 2019-08-12 – 2019-08-15 (×4): 5 mg via ORAL
  Filled 2019-08-12 (×4): qty 1

## 2019-08-12 MED ORDER — LORATADINE 10 MG PO TABS
10.0000 mg | ORAL_TABLET | Freq: Every day | ORAL | Status: DC
  Administered 2019-08-13 – 2019-08-17 (×5): 10 mg via ORAL
  Filled 2019-08-12 (×5): qty 1

## 2019-08-12 MED ORDER — VERAPAMIL HCL 2.5 MG/ML IV SOLN
INTRAVENOUS | Status: DC | PRN
  Administered 2019-08-12 (×2): 10 mL via INTRA_ARTERIAL

## 2019-08-12 MED ORDER — HEPARIN (PORCINE) IN NACL 1000-0.9 UT/500ML-% IV SOLN
INTRAVENOUS | Status: DC | PRN
  Administered 2019-08-12 (×2): 500 mL

## 2019-08-12 MED ORDER — FUROSEMIDE 40 MG PO TABS
40.0000 mg | ORAL_TABLET | Freq: Every day | ORAL | Status: DC
  Administered 2019-08-13 – 2019-08-17 (×4): 40 mg via ORAL
  Filled 2019-08-12 (×5): qty 1

## 2019-08-12 MED ORDER — SODIUM CHLORIDE 0.9 % IV SOLN: 250.0000 mL | INTRAVENOUS | Status: DC | PRN

## 2019-08-12 MED ORDER — HEPARIN SODIUM (PORCINE) 1000 UNIT/ML IJ SOLN
INTRAMUSCULAR | Status: AC
  Filled 2019-08-12: qty 1

## 2019-08-12 MED ORDER — SODIUM CHLORIDE 0.9% FLUSH
3.0000 mL | Freq: Two times a day (BID) | INTRAVENOUS | Status: DC
  Administered 2019-08-12 – 2019-08-16 (×4): 3 mL via INTRAVENOUS

## 2019-08-12 MED ORDER — FENTANYL CITRATE (PF) 100 MCG/2ML IJ SOLN
INTRAMUSCULAR | Status: AC
  Filled 2019-08-12: qty 2

## 2019-08-12 MED ORDER — MIDAZOLAM HCL 2 MG/2ML IJ SOLN
INTRAMUSCULAR | Status: DC | PRN
  Administered 2019-08-12 (×3): 1 mg via INTRAVENOUS

## 2019-08-12 MED ORDER — LIDOCAINE HCL (PF) 1 % IJ SOLN
INTRAMUSCULAR | Status: AC
  Filled 2019-08-12: qty 30

## 2019-08-12 MED ORDER — NITROGLYCERIN 0.4 MG SL SUBL
SUBLINGUAL_TABLET | SUBLINGUAL | Status: AC
  Administered 2019-08-12: 0.4 mg
  Filled 2019-08-12: qty 1

## 2019-08-12 MED ORDER — NITROGLYCERIN 1 MG/10 ML FOR IR/CATH LAB
INTRA_ARTERIAL | Status: AC
  Filled 2019-08-12: qty 10

## 2019-08-12 MED ORDER — ALBUTEROL SULFATE (2.5 MG/3ML) 0.083% IN NEBU: 3.0000 mL | INHALATION_SOLUTION | Freq: Four times a day (QID) | RESPIRATORY_TRACT | Status: DC | PRN

## 2019-08-12 MED ORDER — ASPIRIN EC 81 MG PO TBEC
81.0000 mg | DELAYED_RELEASE_TABLET | Freq: Every day | ORAL | Status: DC
  Administered 2019-08-13 – 2019-08-17 (×4): 81 mg via ORAL
  Filled 2019-08-12 (×5): qty 1

## 2019-08-12 MED ORDER — SODIUM CHLORIDE 0.9 % WEIGHT BASED INFUSION
3.0000 mL/kg/h | INTRAVENOUS | Status: DC
  Administered 2019-08-12: 3 mL/kg/h via INTRAVENOUS

## 2019-08-12 MED ORDER — TICAGRELOR 90 MG PO TABS
90.0000 mg | ORAL_TABLET | Freq: Two times a day (BID) | ORAL | Status: DC
  Administered 2019-08-12 – 2019-08-17 (×10): 90 mg via ORAL
  Filled 2019-08-12 (×10): qty 1

## 2019-08-12 MED ORDER — ASPIRIN 81 MG PO CHEW: 81.0000 mg | CHEWABLE_TABLET | Freq: Every day | ORAL | Status: DC

## 2019-08-12 MED ORDER — CLOPIDOGREL BISULFATE 75 MG PO TABS: 75.0000 mg | ORAL_TABLET | Freq: Every day | ORAL | Status: DC

## 2019-08-12 MED ORDER — CLOPIDOGREL BISULFATE 300 MG PO TABS
ORAL_TABLET | ORAL | Status: DC | PRN
  Administered 2019-08-12: 300 mg via ORAL

## 2019-08-12 MED ORDER — RANOLAZINE ER 500 MG PO TB12
1000.0000 mg | ORAL_TABLET | Freq: Two times a day (BID) | ORAL | Status: DC
  Administered 2019-08-12 – 2019-08-17 (×10): 1000 mg via ORAL
  Filled 2019-08-12 (×10): qty 2

## 2019-08-12 MED ORDER — SIMVASTATIN 20 MG PO TABS
20.0000 mg | ORAL_TABLET | Freq: Every day | ORAL | Status: DC
  Administered 2019-08-12 – 2019-08-15 (×4): 20 mg via ORAL
  Filled 2019-08-12 (×4): qty 1

## 2019-08-12 MED ORDER — ASPIRIN 81 MG PO CHEW: 81.0000 mg | CHEWABLE_TABLET | ORAL | Status: DC

## 2019-08-12 MED ORDER — POLYETHYLENE GLYCOL 3350 17 G PO PACK
17.0000 g | PACK | Freq: Every day | ORAL | Status: DC
  Administered 2019-08-13 – 2019-08-17 (×4): 17 g via ORAL
  Filled 2019-08-12 (×5): qty 1

## 2019-08-12 MED ORDER — SODIUM CHLORIDE 0.9% FLUSH: 3.0000 mL | INTRAVENOUS | Status: DC | PRN

## 2019-08-12 MED ORDER — FENTANYL CITRATE (PF) 100 MCG/2ML IJ SOLN
INTRAMUSCULAR | Status: DC | PRN
  Administered 2019-08-12 (×4): 25 ug via INTRAVENOUS

## 2019-08-12 MED ORDER — HYDRALAZINE HCL 20 MG/ML IJ SOLN: 10.0000 mg | INTRAMUSCULAR | Status: AC | PRN

## 2019-08-12 MED ORDER — NITROGLYCERIN 0.4 MG SL SUBL
0.4000 mg | SUBLINGUAL_TABLET | SUBLINGUAL | Status: DC | PRN
  Administered 2019-08-12: 0.4 mg via SUBLINGUAL

## 2019-08-12 MED ORDER — SODIUM CHLORIDE 0.9 % WEIGHT BASED INFUSION: 1.0000 mL/kg/h | INTRAVENOUS | Status: DC

## 2019-08-12 MED ORDER — LIDOCAINE HCL (PF) 1 % IJ SOLN
INTRAMUSCULAR | Status: DC | PRN
  Administered 2019-08-12: 2 mL

## 2019-08-12 MED ORDER — NITROGLYCERIN 0.4 MG/SPRAY TL SOLN: 1.0000 | Status: DC | PRN

## 2019-08-12 MED ORDER — ACETAMINOPHEN 325 MG PO TABS
650.0000 mg | ORAL_TABLET | ORAL | Status: DC | PRN
  Administered 2019-08-12 – 2019-08-15 (×6): 650 mg via ORAL
  Filled 2019-08-12 (×6): qty 2

## 2019-08-12 MED ORDER — HEPARIN SODIUM (PORCINE) 5000 UNIT/ML IJ SOLN
5000.0000 [IU] | Freq: Three times a day (TID) | INTRAMUSCULAR | Status: DC
  Administered 2019-08-12 – 2019-08-13 (×3): 5000 [IU] via SUBCUTANEOUS
  Filled 2019-08-12 (×3): qty 1

## 2019-08-12 MED ORDER — HEPARIN SODIUM (PORCINE) 1000 UNIT/ML IJ SOLN
INTRAMUSCULAR | Status: DC | PRN
  Administered 2019-08-12: 7000 [IU] via INTRAVENOUS
  Administered 2019-08-12: 3500 [IU] via INTRAVENOUS
  Administered 2019-08-12 (×2): 2000 [IU] via INTRAVENOUS

## 2019-08-12 MED ORDER — ONDANSETRON HCL 4 MG/2ML IJ SOLN: 4.0000 mg | Freq: Four times a day (QID) | INTRAMUSCULAR | Status: DC | PRN

## 2019-08-12 MED ORDER — IOHEXOL 350 MG/ML SOLN
INTRAVENOUS | Status: DC | PRN
  Administered 2019-08-12: 225 mL

## 2019-08-12 MED ORDER — ISOSORBIDE MONONITRATE ER 60 MG PO TB24: 90.0000 mg | ORAL_TABLET | Freq: Every day | ORAL | Status: DC

## 2019-08-12 MED ORDER — PANTOPRAZOLE SODIUM 40 MG PO TBEC: 40.0000 mg | DELAYED_RELEASE_TABLET | Freq: Every day | ORAL | Status: DC | PRN

## 2019-08-12 MED ORDER — LABETALOL HCL 5 MG/ML IV SOLN: 10.0000 mg | INTRAVENOUS | Status: AC | PRN

## 2019-08-12 MED ORDER — SODIUM CHLORIDE 0.9 % WEIGHT BASED INFUSION: 1.0000 mL/kg/h | INTRAVENOUS | Status: AC

## 2019-08-12 MED ORDER — METHOCARBAMOL 500 MG PO TABS: 500.0000 mg | ORAL_TABLET | Freq: Three times a day (TID) | ORAL | Status: DC | PRN

## 2019-08-12 MED ORDER — HEPARIN (PORCINE) IN NACL 1000-0.9 UT/500ML-% IV SOLN
INTRAVENOUS | Status: AC
  Filled 2019-08-12: qty 1000

## 2019-08-12 MED ORDER — METOPROLOL TARTRATE 25 MG PO TABS
25.0000 mg | ORAL_TABLET | Freq: Two times a day (BID) | ORAL | Status: DC
  Administered 2019-08-12 – 2019-08-13 (×2): 25 mg via ORAL
  Filled 2019-08-12 (×2): qty 1

## 2019-08-12 MED ORDER — IOHEXOL 350 MG/ML SOLN
INTRAVENOUS | Status: AC
  Filled 2019-08-12: qty 1

## 2019-08-12 MED ORDER — ACETAMINOPHEN 500 MG PO TABS: 1000.0000 mg | ORAL_TABLET | Freq: Four times a day (QID) | ORAL | Status: DC | PRN

## 2019-08-12 MED ORDER — GABAPENTIN 600 MG PO TABS
600.0000 mg | ORAL_TABLET | Freq: Three times a day (TID) | ORAL | Status: DC
  Administered 2019-08-12 – 2019-08-17 (×14): 600 mg via ORAL
  Filled 2019-08-12 (×14): qty 1

## 2019-08-12 MED ORDER — NITROGLYCERIN IN D5W 200-5 MCG/ML-% IV SOLN
2.0000 ug/min | INTRAVENOUS | Status: DC
  Administered 2019-08-12: 5 ug/min via INTRAVENOUS
  Administered 2019-08-13: 50 ug/min via INTRAVENOUS
  Administered 2019-08-14 (×2): 70 ug/min via INTRAVENOUS
  Filled 2019-08-12 (×4): qty 250

## 2019-08-12 SURGICAL SUPPLY — 24 items
BALLN SAPPHIRE ~~LOC~~ 2.5X10 (BALLOONS) ×1 IMPLANT
BALLN SAPPHIRE ~~LOC~~ 3.0X10 (BALLOONS) ×1 IMPLANT
CATH INFINITI 5 FR JL3.5 (CATHETERS) ×1 IMPLANT
CATH INFINITI 5FR MPB2 (CATHETERS) ×1 IMPLANT
CATH INFINITI 5FR MULTPACK ANG (CATHETERS) ×1 IMPLANT
CATH LAUNCHER 5F EBU3.0 (CATHETERS) IMPLANT
CATH VISTA GUIDE 6FR XB3.5 (CATHETERS) ×1 IMPLANT
CATHETER LAUNCHER 5F EBU3.0 (CATHETERS) ×2
DEVICE RAD COMP TR BAND LRG (VASCULAR PRODUCTS) ×1 IMPLANT
GLIDESHEATH SLEND A-KIT 6F 22G (SHEATH) ×1 IMPLANT
GUIDEWIRE INQWIRE 1.5J.035X260 (WIRE) IMPLANT
INQWIRE 1.5J .035X260CM (WIRE) ×2
KIT ENCORE 26 ADVANTAGE (KITS) ×1 IMPLANT
KIT HEART LEFT (KITS) ×2 IMPLANT
PACK CARDIAC CATHETERIZATION (CUSTOM PROCEDURE TRAY) ×2 IMPLANT
SHEATH PINNACLE 5F 10CM (SHEATH) IMPLANT
SHEATH PROBE COVER 6X72 (BAG) ×1 IMPLANT
STENT RESOLUTE ONYX 3.0X8 (Permanent Stent) ×1 IMPLANT
TRANSDUCER W/STOPCOCK (MISCELLANEOUS) ×2 IMPLANT
TUBING CIL FLEX 10 FLL-RA (TUBING) ×2 IMPLANT
WIRE ASAHI PROWATER 180CM (WIRE) ×2 IMPLANT
WIRE COUGAR XT STRL 190CM (WIRE) ×2 IMPLANT
WIRE EMERALD 3MM-J .035X150CM (WIRE) IMPLANT
WIRE RUNTHROUGH .014X180CM (WIRE) ×1 IMPLANT

## 2019-08-12 NOTE — CV Procedure (Addendum)
   Right radial diagnostic cath with PCI via right radial approach.  Real-time vascular ultrasound used.  Tight catheter failure and significant arm discomfort with radial approach.  In-stent restenosis in the proximal margin of the previously placed diagonal ostial stent.  With difficulty the patient was redilated and a 3.0 x 8 Onyx stent was overlapped with ostium and deployed at 14 atm x 2.  No postdilatation was performed.  Other anatomy is unchanged compared to prior.  LV gram is normal.  Patient lives out of [place], has overlapping stents, and will remain overnight.  300 mg of additional Plavix is given today.  Continue aspirin.

## 2019-08-12 NOTE — TOC Benefit Eligibility Note (Signed)
Transition of Care [facility]) Benefit Eligibility Note    Patient Details  Name: Tamara Nunez MRN: [id number] Date of Birth: 10-05-1955   Medication/Dose: BRILINTA  90 MG BID  Covered?: Yes  Tier: 3 Drug  Prescription Coverage [facility]erred [facility]: [facility]  Spoke with Person/Company/Phone Number:: JANE  @  [employer] # [phone]  Co-Pay: $ 35.00  Prior Approval: No  Deductible: (NO DEDUCTIBLE   OUT-OF-POCKET:UNMET)  Additional Notes: TICAGRELOR: [name] Phone Number: 08/12/2019, 4:07 PM

## 2019-08-12 NOTE — Interval H&P Note (Signed)
Cath Lab Visit (complete for each Cath Lab visit)  Clinical Evaluation Leading to the Procedure:   ACS: No.  Non-ACS:    Anginal Classification: CCS III  Anti-ischemic medical therapy: Maximal Therapy (2 or more classes of medications)  Non-Invasive Test Results: No non-invasive testing performed  Prior CABG: Previous CABG      History and Physical Interval Note:  08/12/2019 7:29 AM  Tamara Nunez  has presented today for surgery, with the diagnosis of angina.  The various methods of treatment have been discussed with the patient and family. After consideration of risks, benefits and other options for treatment, the patient has consented to  Procedure(s): LEFT HEART CATH AND CORS/GRAFTS ANGIOGRAPHY (N/A) as a surgical intervention.  The patient's history has been reviewed, patient examined, no change in status, stable for surgery.  I have reviewed the patient's chart and labs.  Questions were answered to the patient's satisfaction.     Lyn Records III

## 2019-08-12 NOTE — Progress Notes (Addendum)
Pt having 10/10 chest pain upon arrival to floor from cath lab, EKG obtained, Dr Rennis Golden present in room, nitrostat given with little relief, new orders for nitro drip received and intitiated.

## 2019-08-12 NOTE — Progress Notes (Signed)
   Well known patient of mine seen post cath today for unstable angina - found to have aggressive, recurrent obstructive CAD. Saw her when she arrived on 6E post-PCI to an ostial diagonal branch of the LAD - she is complaining of significant SSCP and dyspnea - EKG performed which shows acute anteroseptal TWI's (not present this am) and chronic RBBB. I reviewed the EKG with Dr. Katrinka Blazing (who put in her stent) and personally reviewed the cath films with him. It appears she has probably occluded her septal perforator due to plaque shift - expect that she will have a small proximal septal infarct. Additionally, there appears to be significant mid-distal RCA stenosis - this is calcified and may need intervention supported by rotational atherectomy. I have switched her Plavix to Brillinta 90 mg BID, starting tonight, since she was loaded with 300 mg Plavix this am.  Chrystie Nose, MD, [facility], FACP  [place]  [facility]  Medical Director of the Advanced Lipid Disorders &  Cardiovascular Risk Reduction Clinic Diplomate of the American Board of Clinical Lipidology Attending Cardiologist  Direct Dial: [phone]  Fax: [phone]  Website:  [email or web address]

## 2019-08-13 DIAGNOSIS — Z90711 Acquired absence of uterus with remaining cervical stump: Secondary | ICD-10-CM | POA: Diagnosis not present

## 2019-08-13 DIAGNOSIS — Z79899 Other long term (current) drug therapy: Secondary | ICD-10-CM | POA: Diagnosis not present

## 2019-08-13 DIAGNOSIS — I2582 Chronic total occlusion of coronary artery: Secondary | ICD-10-CM | POA: Diagnosis present

## 2019-08-13 DIAGNOSIS — Z7982 Long term (current) use of aspirin: Secondary | ICD-10-CM | POA: Diagnosis not present

## 2019-08-13 DIAGNOSIS — Z881 Allergy status to other antibiotic agents status: Secondary | ICD-10-CM | POA: Diagnosis not present

## 2019-08-13 DIAGNOSIS — Z9884 Bariatric surgery status: Secondary | ICD-10-CM | POA: Diagnosis not present

## 2019-08-13 DIAGNOSIS — I97190 Other postprocedural cardiac functional disturbances following cardiac surgery: Secondary | ICD-10-CM | POA: Diagnosis present

## 2019-08-13 DIAGNOSIS — E11649 Type 2 diabetes mellitus with hypoglycemia without coma: Secondary | ICD-10-CM | POA: Diagnosis present

## 2019-08-13 DIAGNOSIS — E1159 Type 2 diabetes mellitus with other circulatory complications: Secondary | ICD-10-CM | POA: Diagnosis present

## 2019-08-13 DIAGNOSIS — Z885 Allergy status to narcotic agent status: Secondary | ICD-10-CM | POA: Diagnosis not present

## 2019-08-13 DIAGNOSIS — I200 Unstable angina: Secondary | ICD-10-CM | POA: Diagnosis not present

## 2019-08-13 DIAGNOSIS — E782 Mixed hyperlipidemia: Secondary | ICD-10-CM | POA: Diagnosis present

## 2019-08-13 DIAGNOSIS — I21A9 Other myocardial infarction type: Secondary | ICD-10-CM | POA: Diagnosis not present

## 2019-08-13 DIAGNOSIS — I4510 Unspecified right bundle-branch block: Secondary | ICD-10-CM | POA: Diagnosis present

## 2019-08-13 DIAGNOSIS — Z955 Presence of coronary angioplasty implant and graft: Secondary | ICD-10-CM | POA: Diagnosis not present

## 2019-08-13 DIAGNOSIS — I209 Angina pectoris, unspecified: Secondary | ICD-10-CM | POA: Diagnosis present

## 2019-08-13 DIAGNOSIS — M797 Fibromyalgia: Secondary | ICD-10-CM | POA: Diagnosis present

## 2019-08-13 DIAGNOSIS — D638 Anemia in other chronic diseases classified elsewhere: Secondary | ICD-10-CM | POA: Diagnosis present

## 2019-08-13 DIAGNOSIS — E785 Hyperlipidemia, unspecified: Secondary | ICD-10-CM | POA: Diagnosis not present

## 2019-08-13 DIAGNOSIS — G8929 Other chronic pain: Secondary | ICD-10-CM | POA: Diagnosis present

## 2019-08-13 DIAGNOSIS — I10 Essential (primary) hypertension: Secondary | ICD-10-CM | POA: Diagnosis present

## 2019-08-13 DIAGNOSIS — Y831 Surgical operation with implant of artificial internal device as the cause of abnormal reaction of the patient, or of later complication, without mention of misadventure at the time of the procedure: Secondary | ICD-10-CM | POA: Diagnosis not present

## 2019-08-13 DIAGNOSIS — I214 Non-ST elevation (NSTEMI) myocardial infarction: Secondary | ICD-10-CM | POA: Diagnosis not present

## 2019-08-13 DIAGNOSIS — Z7902 Long term (current) use of antithrombotics/antiplatelets: Secondary | ICD-10-CM | POA: Diagnosis not present

## 2019-08-13 DIAGNOSIS — Z8249 Family history of ischemic heart disease and other diseases of the circulatory system: Secondary | ICD-10-CM | POA: Diagnosis not present

## 2019-08-13 DIAGNOSIS — I25110 Atherosclerotic heart disease of native coronary artery with unstable angina pectoris: Secondary | ICD-10-CM | POA: Diagnosis present

## 2019-08-13 DIAGNOSIS — T82855A Stenosis of coronary artery stent, initial encounter: Secondary | ICD-10-CM | POA: Diagnosis not present

## 2019-08-13 DIAGNOSIS — K219 Gastro-esophageal reflux disease without esophagitis: Secondary | ICD-10-CM | POA: Diagnosis present

## 2019-08-13 DIAGNOSIS — Z981 Arthrodesis status: Secondary | ICD-10-CM | POA: Diagnosis not present

## 2019-08-13 LAB — BASIC METABOLIC PANEL
Anion gap: 9 (ref 5–15)
BUN: 11 mg/dL (ref 8–23)
CO2: 23 mmol/L (ref 22–32)
Calcium: 8.8 mg/dL — ABNORMAL LOW (ref 8.9–10.3)
Chloride: 111 mmol/L (ref 98–111)
Creatinine, Ser: 0.89 mg/dL (ref 0.44–1.00)
GFR calc Af Amer: >60 mL/min (ref >60)
GFR calc non Af Amer: >60 mL/min (ref >60)
Glucose, Bld: 80 mg/dL (ref 70–99)
Potassium: 4 mmol/L (ref 3.5–5.1)
Sodium: 143 mmol/L (ref 135–145)

## 2019-08-13 LAB — CBC
HCT: 30.9 % — ABNORMAL LOW (ref 36.0–46.0)
Hemoglobin: 9.2 g/dL — ABNORMAL LOW (ref 12.0–15.0)
MCH: 22.3 pg — ABNORMAL LOW (ref 26.0–34.0)
MCHC: 29.8 g/dL — ABNORMAL LOW (ref 30.0–36.0)
MCV: 75 fL — ABNORMAL LOW (ref 80.0–100.0)
Platelets: 326 10*3/uL (ref 150–400)
RBC: 4.12 MIL/uL (ref 3.87–5.11)
RDW: 23.7 % — ABNORMAL HIGH (ref 11.5–15.5)
WBC: 5 10*3/uL (ref 4.0–10.5)
nRBC: 0 % (ref 0.0–0.2)

## 2019-08-13 LAB — TROPONIN I (HIGH SENSITIVITY)
Troponin I (High Sensitivity): 1177 ng/L (ref <18)
Troponin I (High Sensitivity): 1303 ng/L (ref <18)

## 2019-08-13 MED ORDER — HEPARIN (PORCINE) 25000 UT/250ML-% IV SOLN: 10.0000 [IU]/kg/h | INTRAVENOUS | Status: DC

## 2019-08-13 MED ORDER — HEPARIN (PORCINE) 25000 UT/250ML-% IV SOLN
1000.0000 [IU]/h | INTRAVENOUS | Status: DC
  Administered 2019-08-13: 1000 [IU]/h via INTRAVENOUS
  Filled 2019-08-13: qty 250

## 2019-08-13 MED ORDER — METOPROLOL TARTRATE 25 MG PO TABS
25.0000 mg | ORAL_TABLET | Freq: Four times a day (QID) | ORAL | Status: DC
  Administered 2019-08-13 – 2019-08-16 (×7): 25 mg via ORAL
  Filled 2019-08-13 (×2): qty 1
  Filled 2019-08-13: qty 2
  Filled 2019-08-13 (×5): qty 1

## 2019-08-13 NOTE — Plan of Care (Signed)
  Problem: Education: Goal: Knowledge of General Education information will improve Description: Including pain rating scale, medication(s)/side effects and non-pharmacologic comfort measures Outcome: Progressing   Problem: Clinical Measurements: Goal: Ability to maintain clinical measurements within normal limits will improve Outcome: Progressing Goal: Cardiovascular complication will be avoided Outcome: Progressing   Problem: Activity: Goal: Risk for activity intolerance will decrease Outcome: Progressing   Problem: Coping: Goal: Level of anxiety will decrease Outcome: Progressing   

## 2019-08-13 NOTE — Progress Notes (Signed)
Update per Dr. Ladona Ridgel, no plans to return to cath lab at present time; he added IV heparin. Nurse updated. F/u troponin level ordered for this evening and tomorrow AM. We will sign out to fellow. Hinton Luellen PA-C

## 2019-08-13 NOTE — Progress Notes (Signed)
Pt continues to have sharp mid sternal pain, nitro drip increased per orders, SOB noted, 02 BNC set up at 2 liters, EKG obtained.

## 2019-08-13 NOTE — Progress Notes (Signed)
Called to pts room, states chest pain is progressing, 7/10 and radiating down left arm, report SOB with talking, MD notified.

## 2019-08-13 NOTE — Progress Notes (Addendum)
   CTSP for persistent chest discomfort, similar to this morning but also associated with some intermittent SOB. She reports it's fairly constant but also sometimes in waves of heavy pressure that also feels stabbing. This is similar to the CP that brought her into the hospital. EKG continutes to show NSR with RBBB and TWI III, avF, V2-V6 - inferior changes and V4-V6 are new since this AM, but similarly seen yesterday post-PCI except not in V5-V6. IV NTG has been titrated but BP in the 1teens presently. D/w Dr. Ladona Ridgel who will discuss case with Dr. Tresa Endo regarding return to cath lab. In case decision is made to return to cath lab I did discuss the consent with her. Risks and benefits of cardiac catheterization have been discussed with the patient. These include bleeding, infection, kidney damage, stroke, heart attack, death. The patient understands these risks and is willing to proceed if needed - states she just wants to feel better. On examination she is comfortable appearing, no acute distress, heart sounds RRR, lungs clear, no edema. hsTroponins are pending. Ronie Spies PA-C   Cardiology Attending  Called to see patient for ongoing chest pain which has been present much of the day. She has had an ECG which demonstrates STT changes slightly worse than her baseline. She does not appear to be in distress on my arrival. Her troponin is 1177 but we do not know her baseline as she did not have any drawn on this admit.  I have reviewed her heart cath. I will confer with Dr. Tresa Endo who is on call for interventional.   Leonia Reeves.D.

## 2019-08-13 NOTE — Progress Notes (Signed)
ANTICOAGULATION CONSULT NOTE - Initial Consult  Pharmacy Consult for heparin  Indication: chest pain/ACS  Allergies  Allergen Reactions  . Codeine Hives and Shortness Of Breath       . Other Anaphylaxis    Almonds   . Ciprofloxacin Itching  . Valsartan Rash  . Adhesive [Tape] Itching    "clear tape" used after surgery."took all her skin off".  Paper take is ok  . Sulfa Antibiotics Nausea Only    Patient Measurements: Height: 5\' 5"  (165.1 cm) Weight: 74.6 kg (164 lb 8 oz) IBW/kg (Calculated) : 57  Vital Signs: Temp: 97.4 F (36.3 C) (06/05 1421) Temp Source: Oral (06/05 1421) BP: 115/64 (06/05 1421) Pulse Rate: 96 (06/05 1421)  Labs: Recent Labs    08/12/19 1147 08/13/19 0456 08/13/19 1535  HGB 9.1* 9.2*  --   HCT 31.5* 30.9*  --   PLT 345 326  --   CREATININE 0.88 0.89  --   TROPONINIHS  --   --  1,177*    Estimated Creatinine Clearance: 63.7 mL/min (by C-G formula based on SCr of 0.89 mg/dL).   Medical History: Past Medical History:  Diagnosis Date  . Arthritis    "in my back" (05/06/2017)  . CAD (coronary artery disease)    CABG 2006, 09/2016 DES RCA, 04/2017 DES Circ, 06/2018 DES 1st diag  . Chronic back pain   . Coronary artery disease    a. s/p CABG in 2006 with LIMA-LAD, SVG-dLAD. b. cath in 2009 showing patent grafts. c. 09/2016: cath with occluded LAD but patent SVG-dLAD. LIMA was atretic. pRCA with 80% stenosis --> treated with DES  . Fibromyalgia   . GERD (gastroesophageal reflux disease)   . Hyperlipidemia   . Hypertension   . Type II diabetes mellitus (HCC)     Medications:  Facility-Administered Medications Prior to Admission  Medication Dose Route Frequency Provider Last Rate Last Admin  . sodium chloride flush (NS) 0.9 % injection 3 mL  3 mL Intravenous Q12H 10/2016, PA       Medications Prior to Admission  Medication Sig Dispense Refill Last Dose  . acetaminophen (TYLENOL) 500 MG tablet Take 1,000 mg by mouth every 6 (six) hours as  needed for moderate pain or headache.   Past Week at Unknown time  . albuterol (PROVENTIL HFA;VENTOLIN HFA) 108 (90 BASE) MCG/ACT inhaler Inhale 2 puffs into the lungs every 6 (six) hours as needed for wheezing.     Azalee Course aspirin EC 81 MG tablet Take 81 mg by mouth daily.   08/12/2019 at 0400  . celecoxib (CELEBREX) 200 MG capsule Take 200 mg by mouth daily.   08/09/2019  . cetirizine (ZYRTEC) 10 MG tablet Take 10 mg by mouth daily.      . clopidogrel (PLAVIX) 75 MG tablet Take 75 mg by mouth daily.   08/12/2019 at Unknown time  . CO-ENZYME Q-10 PO Take 200 mg by mouth daily.    Past Week at Unknown time  . diclofenac sodium (VOLTAREN) 1 % GEL Apply 2 g topically 4 (four) times daily as needed (pain).      . fluticasone (FLONASE) 50 MCG/ACT nasal spray Place 1 spray into both nostrils daily.      . furosemide (LASIX) 40 MG tablet Take 1 tablet (40 mg total) by mouth daily. 90 tablet 1 08/09/2019  . gabapentin (NEURONTIN) 600 MG tablet Take 600 mg by mouth 3 (three) times daily.    Past Week at Unknown time  . isosorbide  mononitrate (IMDUR) 60 MG 24 hr tablet TAKE 1 AND 1/2 TABLETS BY  MOUTH TWICE DAILY (Patient taking differently: Take 90 mg by mouth in the morning and at bedtime. ) 270 tablet 3 08/12/2019 at Unknown time  . lidocaine (LIDODERM) 5 % Place 2-3 patches onto the skin daily as needed (pain). Remove & Discard patch within 12 hours or as directed by MD    08/12/2019 at Unknown time  . methocarbamol (ROBAXIN) 500 MG tablet Take 1 tablet (500 mg total) by mouth every 8 (eight) hours as needed for muscle spasms. 60 tablet 1 Past Month at Unknown time  . metoprolol tartrate (LOPRESSOR) 25 MG tablet Take 25 mg by mouth 2 (two) times daily.   08/12/2019 at Unknown time  . nitroGLYCERIN (NITROLINGUAL) 0.4 MG/SPRAY spray Place 1 spray under the tongue every 5 (five) minutes as needed for chest pain. 12 g 1 Past Month at Unknown time  . pantoprazole (PROTONIX) 40 MG tablet Take 40 mg by mouth daily as needed (acid  reflux).    Past Week at Unknown time  . polyethylene glycol powder (GLYCOLAX/MIRALAX) powder Take 17 g by mouth daily.    Past Week at Unknown time  . ramipril (ALTACE) 5 MG capsule Take 5 mg by mouth at bedtime.    08/11/2019 at Unknown time  . ranolazine (RANEXA) 1000 MG SR tablet Take 1 tablet (1,000 mg total) by mouth 2 (two) times daily. 56 tablet 0 08/12/2019 at Unknown time  . simvastatin (ZOCOR) 20 MG tablet Take 20 mg by mouth at bedtime.   08/11/2019 at Unknown time  . vitamin B-12 (CYANOCOBALAMIN) 1000 MCG tablet Take 1,000 mcg by mouth daily.   08/12/2019 at Unknown time  . Vitamin D, Ergocalciferol, (DRISDOL) 1.25 MG (50000 UNIT) CAPS capsule Take 1 capsule (50,000 Units total) by mouth every 7 (seven) days. 12 capsule 0 Past Week at Unknown time  . vitamin E 400 UNIT capsule Take 400 Units by mouth daily.   08/12/2019 at Unknown time  . VITRON-C 65-125 MG TABS Take 1 tablet by mouth 2 (two) times daily.   08/12/2019 at Unknown time  . Fingerstix Lancets MISC Inject into the skin.     Marland Kitchen HYDROcodone-acetaminophen (NORCO/VICODIN) 5-325 MG tablet Take 1-2 tablets by mouth every 4 (four) hours as needed for moderate pain ((score 7 to 10)). (Patient not taking: Reported on 08/05/2019) 30 tablet 0 Not Taking at Unknown time  . ONETOUCH ULTRA test strip Test BG qid 400 each 1    Scheduled:  . aspirin EC  81 mg Oral Daily  . furosemide  40 mg Oral Daily  . gabapentin  600 mg Oral TID  . loratadine  10 mg Oral Daily  . metoprolol tartrate  25 mg Oral BID  . polyethylene glycol  17 g Oral Daily  . ramipril  5 mg Oral QHS  . ranolazine  1,000 mg Oral BID  . simvastatin  20 mg Oral QHS  . sodium chloride flush  3 mL Intravenous Q12H  . ticagrelor  90 mg Oral BID    Assessment: 64 yo female with CP and elevated troponin. She is also noted s/p cath on 6/4 with stent placed on ticagrelor. Pharmacy consulted to dose heparin.  No anticoagulants noted PTA. -She was on Sq heparin with last dose at  2:30pm -Hg= 9.2  Goal of Therapy:  Heparin level 0.3-0.7 units/ml Monitor platelets by anticoagulation protocol: Yes   Plan:  -No heparin bolus -Begin heparin at 1000  units/hr -Heparin level in 6 hours and daily wth CBC daily  Harland German, PharmD Clinical Pharmacist **Pharmacist phone directory can now be found on [email or web address] ([email or web address]).  Listed under [facility].

## 2019-08-13 NOTE — Significant Event (Signed)
Cardiology Moonlighter Note  Paged by care nurse. Patient having 7/10 chest pain. ECG performed showing scattered ST depressions. Nitroglycerin gtt at 65 mcg/min with minimal relief. I contacted and discussed the case with on call interventionalist Dr Tresa Endo. We reviewed the patient's case and cath from yesterday. She has diffuse coronary disease and her prior PCI was a complicated procedure with fortunately a good angiographic result. Ultimately Dr. Tresa Endo decided that there was no clear culprit that could be intervened upon at this time, thus medical management was recommended.   The patient has an allergy to codeine listed in the chart, however the patient states she has received morphine earlier this year for her back surgery and tolerated this well. She prefers not to have any opiates at this time. Will increase her metoprolol to 25mg  q6h and increase her nitroglycerin drip aggressively. Will monitor for improvement in patient's symptoms. Will call Dr back to discuss case further if patient's symptoms worsen or if she develops ST elevations on ECG.   Tresa Endo, MD Cardiology Fellow, PGY-7

## 2019-08-13 NOTE — Progress Notes (Signed)
Progress Note  Patient Name: Tamara Nunez Date of Encounter: 08/13/2019  [facility] Cardiologist: Pixie Casino, MD  Subjective   Ongoing intermittent chest pain.   Inpatient Medications    Scheduled Meds: . aspirin EC  81 mg Oral Daily  . furosemide  40 mg Oral Daily  . gabapentin  600 mg Oral TID  . heparin  5,000 Units Subcutaneous Q8H  . loratadine  10 mg Oral Daily  . metoprolol tartrate  25 mg Oral BID  . polyethylene glycol  17 g Oral Daily  . ramipril  5 mg Oral QHS  . ranolazine  1,000 mg Oral BID  . simvastatin  20 mg Oral QHS  . sodium chloride flush  3 mL Intravenous Q12H  . ticagrelor  90 mg Oral BID   Continuous Infusions: . sodium chloride    . nitroGLYCERIN 15 mcg/min (08/13/19 1105)   PRN Meds: sodium chloride, acetaminophen, albuterol, nitroGLYCERIN, ondansetron (ZOFRAN) IV, pantoprazole, sodium chloride flush   Vital Signs    Vitals:   08/12/19 2259 08/13/19 0020 08/13/19 0532 08/13/19 0929  BP: 136/87  127/64 132/66  Pulse: 66 83 76 63  Resp:  _0 Temp:   98.8 F (37.1 C) 98.4 F (36.9 C)  TempSrc:   Oral Oral  SpO2:   100% 100%  Weight:   74.6 kg   Height:        Intake/Output Summary (Last 24 hours) at 08/13/2019 1116 Last data filed at 08/13/2019 0446 Gross per 24 hour  Intake 489.28 ml  Output --  Net 489.28 ml   Last 3 Weights 08/13/2019 08/12/2019 08/03/2019  Weight (lbs) 164 lb 8 oz 164 lb 164 lb 12.8 oz  Weight (kg) 74.617 kg 74.39 kg 74.753 kg      Telemetry    SR - Personally Reviewed  ECG    n/a - Personally Reviewed  Physical Exam   GEN: No acute distress.   Neck: No JVD Cardiac: RRR, no murmurs, rubs, or gallops.  Respiratory: Clear to auscultation bilaterally. GI: Soft, nontender, non-distended  MS: No edema; No deformity. Neuro:  Nonfocal  Psych: Normal affect   Labs    High Sensitivity Troponin:  No results for input(s): TROPONINIHS in the last 720 hours.    Chemistry Recent Labs  Lab  08/09/19 0831 08/12/19 1147 08/13/19 0456  NA 141  --  143  K 5.0  --  4.0  CL 106  --  111  CO2 21  --  23  GLUCOSE 59*  --  80  BUN 18  --  11  CREATININE 0.85 0.88 0.89  CALCIUM 9.3  --  8.8*  GFRNONAA 72 >60 >60  GFRAA 83 >60 >60  ANIONGAP  --   --  9     Hematology Recent Labs  Lab 08/09/19 0831 08/12/19 1147 08/13/19 0456  WBC 5.5 5.4 5.0  RBC 4.37 4.12 4.12  HGB 9.8* 9.1* 9.2*  HCT 32.9* 31.5* 30.9*  MCV 75* 76.5* 75.0*  MCH 22.4* 22.1* 22.3*  MCHC 29.8* 28.9* 29.8*  RDW 21.4* 23.8* 23.7*  PLT 361 345 326    BNPNo results for input(s): BNP, PROBNP in the last 168 hours.   DDimer No results for input(s): DDIMER in the last 168 hours.   Radiology    CARDIAC CATHETERIZATION  Result Date: 08/12/2019  Prox LAD-1 lesion is 25% stenosed.  Post intervention, there is a 0% residual stenosis.  Prox LAD-2 lesion is 100%  stenosed.  Origin to Mid Graft lesion is 99% stenosed.  Mid RCA to Dist RCA lesion is 75% stenosed.  A stent was successfully placed.  A stent was successfully placed.   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  Ostial LAD stent with focal ISR at the ostium to proximal diagonal region.  Stenosis is greater than 90%.  The LAD is totally occluded beyond the origin of the first diagonal.  Circumflex is large and has irregularities with up to 50 to 60% mid and 30% distal distal native disease.  The proximal stent is widely patent and may extend slightly into the distal left main.  The right coronary is dominant and has diffuse heavily calcified disease throughout the mid vessel and up to 75% stenosis.   LV function is normal.  LVEDP is normal.  Successful but technically difficult angioplasty followed by re-stenting of the ostial diagonal.  A 3.0 x 8 Onyx was overlapped with the proximal one fourth of the previously placed stent and deployed at 14 atm x 2 with TIMI grade III flow. Recommendations:   If angina continues, consider orbital atherectomy of the mid segment of the right coronary.  Will need to be femoral approach.  Aggressive risk factor modification.  Continue aspirin and Plavix.  Discharge in a.m. if stable.    Cardiac Studies    Patient Profile     Tamara Nunez is a 64 y.o. female with a hx of HTN, HLD, DM 2, fibromyalgia andCAD s/p CABG 2006 with LIMA-LAD, SVG-distal LAD. Repeat cardiac catheterization in 2009 showed widely patent grafts. She has significant headache on Imdur. She underwent cardiac catheterization by Dr. Ellyn Hack in July 2018 demonstrating 80% proximal RCA lesion treated with 2.5 x 8 mm DES, she was also found to have atretic LIMA to LAD with100% occlusion to mid LAD, SVG to distal LAD was normal. Due to worsening fatigue and exertional symptoms, she underwent another cardiac catheterization on 06/25/2018, this showed 100% proximal to mid LAD occlusion, 70% ostial OM 2, patent left circumflex stent, 90% ostial D1 lesion treated with a 2.5 x 16 mm DES, EF 55 to 65%.   Admitted with chest pain.   Assessment & Plan    1. Chest pain/CAD - extensive CAD history as reported above, with prior CABG and stenting.  - referred for cath after being seen in clinic with progressing chest pain - cath this admit: prox LAD CTO, D1 90%, LCX prox 40%, OM2 70%, RCA mid 75%. LIMA-LAD atretic that is chronic, SVG-mid LAD patent.  - received DES to prox LAD/ostial diag, DES to D1 - from notes if recurrent angina consider atherectomy of mid RCA   - medical therapy with ASA 81, lopressor 24m bid, ramipril 511m simva 20, brillinta 9031mid, nitro gtt - from notes headaches on imdur previously but tolerating NG gtt  - yesterday new anteroseptal TWIs and subscapular pain. Dr HilDebara Pickettviewed films with Dr SmiTamala Julianhought likely occluded a septal perforator due to plaque shift. TWIs improved this AM -ongoing intermittent chest pains, improving on NG drip. May require repeat  cath Monday to determine if due to plaque shift vs ongoing ischemia from RCA disease.       For questions or updates, please contact [facility] consult [email or web address] for contact info under        Signed, BraCarlyle DollyD  08/13/2019, 11:16 AM

## 2019-08-13 NOTE — Care Management (Signed)
Patient provided with Brilinta 30 day free and copay reduction cards.

## 2019-08-13 NOTE — Progress Notes (Addendum)
CRITICAL VALUE ALERT  Critical Value:  Troponin 1177  Date & Time Notied:  08/13/19 1620  Provider Notified: PA Dayna   Orders Received/Actions taken: awaiting orders

## 2019-08-13 NOTE — Progress Notes (Signed)
1100-1200 Came to see pt to begin education. Discussed importance of brilinta with stent. Reviewed NTG use, gave heart healthy and diabetic diets, discussed CRP 2. Assisted to bathroom and back to recliner. Pt having intermittent episodes of chest sharp pain while I was with her. Denied need for oxygen. Dr Wyline Mood in to see. Pt tearful and emotional and spiritual support given. Will continue to follow. Referral to [facility] CRP 2.  Pt stated she did not attend last April but will attend this referral. Luetta Nutting RN BSN 08/13/2019 11:59 AM

## 2019-08-14 ENCOUNTER — Encounter (HOSPITAL_COMMUNITY): Payer: Self-pay | Admitting: Interventional Cardiology

## 2019-08-14 ENCOUNTER — Other Ambulatory Visit: Payer: Self-pay

## 2019-08-14 LAB — CBC
HCT: 29.3 % — ABNORMAL LOW (ref 36.0–46.0)
Hemoglobin: 8.8 g/dL — ABNORMAL LOW (ref 12.0–15.0)
MCH: 22.1 pg — ABNORMAL LOW (ref 26.0–34.0)
MCHC: 30 g/dL (ref 30.0–36.0)
MCV: 73.6 fL — ABNORMAL LOW (ref 80.0–100.0)
Platelets: 327 10*3/uL (ref 150–400)
RBC: 3.98 MIL/uL (ref 3.87–5.11)
RDW: 23.1 % — ABNORMAL HIGH (ref 11.5–15.5)
WBC: 6.5 10*3/uL (ref 4.0–10.5)
nRBC: 0 % (ref 0.0–0.2)

## 2019-08-14 LAB — HEPARIN LEVEL (UNFRACTIONATED)
Heparin Unfractionated: 1.09 IU/mL — ABNORMAL HIGH (ref 0.30–0.70)
Heparin Unfractionated: 0.98 IU/mL — ABNORMAL HIGH (ref 0.30–0.70)
Heparin Unfractionated: 0.65 IU/mL (ref 0.30–0.70)

## 2019-08-14 LAB — TROPONIN I (HIGH SENSITIVITY): Troponin I (High Sensitivity): 520 ng/L (ref <18)

## 2019-08-14 MED ORDER — MORPHINE SULFATE (PF) 4 MG/ML IV SOLN
4.0000 mg | Freq: Once | INTRAVENOUS | Status: AC
  Administered 2019-08-14: 4 mg via INTRAVENOUS
  Filled 2019-08-14: qty 1

## 2019-08-14 MED ORDER — HEPARIN (PORCINE) 25000 UT/250ML-% IV SOLN
650.0000 [IU]/h | INTRAVENOUS | Status: DC
  Administered 2019-08-15: 650 [IU]/h via INTRAVENOUS
  Filled 2019-08-14: qty 250

## 2019-08-14 MED ORDER — MORPHINE SULFATE (PF) 4 MG/ML IV SOLN
4.0000 mg | INTRAVENOUS | Status: DC | PRN
  Administered 2019-08-14: 4 mg via INTRAVENOUS
  Filled 2019-08-14: qty 1

## 2019-08-14 MED ORDER — DIPHENHYDRAMINE HCL 25 MG PO CAPS
50.0000 mg | ORAL_CAPSULE | Freq: Once | ORAL | Status: AC
  Administered 2019-08-14: 50 mg via ORAL
  Filled 2019-08-14: qty 2

## 2019-08-14 MED ORDER — SODIUM CHLORIDE 0.9 % IV SOLN
500.0000 mL | Freq: Once | INTRAVENOUS | Status: AC
  Administered 2019-08-14: 500 mL via INTRAVENOUS

## 2019-08-14 NOTE — Progress Notes (Signed)
ANTICOAGULATION CONSULT NOTE   Pharmacy Consult for Heparin  Indication: chest pain/ACS  Allergies  Allergen Reactions  . Codeine Hives and Shortness Of Breath       . Other Anaphylaxis    Nuts  . Ciprofloxacin Itching  . Valsartan Rash  . Adhesive [Tape] Itching    "clear tape" used after surgery."took all her skin off".  Paper take is ok  . Sulfa Antibiotics Nausea Only    Patient Measurements: Height: 5\' 5"  (165.1 cm) Weight: 74.6 kg (164 lb 8 oz) IBW/kg (Calculated) : 57  Vital Signs: Temp: 98.1 F (36.7 C) (06/06 1408) Temp Source: Oral (06/06 1408) BP: 110/71 (06/06 1552) Pulse Rate: 77 (06/06 1552)  Labs: Recent Labs    08/12/19 1147 08/12/19 1147 08/13/19 0456 08/13/19 1535 08/13/19 1709 08/14/19 0017 08/14/19 0933 08/14/19 1648  HGB 9.1*   < > 9.2*  --   --  8.8*  --   --   HCT 31.5*  --  30.9*  --   --  29.3*  --   --   PLT 345  --  326  --   --  327  --   --   HEPARINUNFRC  --   --   --   --   --  1.09* 0.98* 0.65  CREATININE 0.88  --  0.89  --   --   --   --   --   TROPONINIHS  --   --   --  1,177* 1,303*  --  520*  --    < > = values in this interval not displayed.    Estimated Creatinine Clearance: 63.7 mL/min (by C-G formula based on SCr of 0.89 mg/dL).   Medical History: Past Medical History:  Diagnosis Date  . Arthritis    "in my back" (05/06/2017)  . CAD (coronary artery disease)    CABG 2006, 09/2016 DES RCA, 04/2017 DES Circ, 06/2018 DES 1st diag  . Chronic back pain   . Coronary artery disease    a. s/p CABG in 2006 with LIMA-LAD, SVG-dLAD. b. cath in 2009 showing patent grafts. c. 09/2016: cath with occluded LAD but patent SVG-dLAD. LIMA was atretic. pRCA with 80% stenosis --> treated with DES  . Fibromyalgia   . GERD (gastroesophageal reflux disease)   . Hyperlipidemia   . Hypertension   . Type II diabetes mellitus (HCC)     Assessment: 64 yo female with CP and elevated troponin. She is also noted s/p cath on 6/4 with stent  placed on ticagrelor. Pharmacy consulted to dose heparin.  No anticoagulants noted PTA. Cards will reassess need for repeat cath on Monday -heparin level at = 0.65 after decrease to 650 units/hr   Goal of Therapy:  Heparin level 0.3-0.7 units/ml Monitor platelets by anticoagulation protocol: Yes   Plan:  Continue heparin at 650 units/hr Daily HL and CBC   Sunday, PharmD Clinical Pharmacist **Pharmacist phone directory can now be found on [email or web address] ([email or web address]).  Listed under [facility].

## 2019-08-14 NOTE — Progress Notes (Signed)
Progress Note  Patient Name: Tamara Nunez Date of Encounter: 08/14/2019  [facility] Cardiologist: Chrystie Nose, MD   Subjective   Intermittent chest pain yesterday  Inpatient Medications    Scheduled Meds: . aspirin EC  81 mg Oral Daily  . furosemide  40 mg Oral Daily  . gabapentin  600 mg Oral TID  . loratadine  10 mg Oral Daily  . metoprolol tartrate  25 mg Oral Q6H  . polyethylene glycol  17 g Oral Daily  . ramipril  5 mg Oral QHS  . ranolazine  1,000 mg Oral BID  . simvastatin  20 mg Oral QHS  . sodium chloride flush  3 mL Intravenous Q12H  . ticagrelor  90 mg Oral BID   Continuous Infusions: . sodium chloride    . heparin 800 Units/hr (08/14/19 0317)  . nitroGLYCERIN 75 mcg/min (08/14/19 0558)   PRN Meds: sodium chloride, acetaminophen, albuterol, nitroGLYCERIN, ondansetron (ZOFRAN) IV, pantoprazole, sodium chloride flush   Vital Signs    Vitals:   08/14/19 0557 08/14/19 0614 08/14/19 0702 08/14/19 0818  BP: 110/69 103/70 115/69 122/65  Pulse: 73 79  71  Resp:      Temp:      TempSrc:      SpO2:      Weight:      Height:        Intake/Output Summary (Last 24 hours) at 08/14/2019 0826 Last data filed at 08/14/2019 0431 Gross per 24 hour  Intake 436.93 ml  Output --  Net 436.93 ml   Last 3 Weights 08/13/2019 08/12/2019 08/03/2019  Weight (lbs) 164 lb 8 oz 164 lb 164 lb 12.8 oz  Weight (kg) 74.617 kg 74.39 kg 74.753 kg      Telemetry    SR - Personally Reviewed  ECG    n/a - Personally Reviewed  Physical Exam   GEN: No acute distress.   Neck: No JVD Cardiac: RRR, no murmurs, rubs, or gallops.  Respiratory: Clear to auscultation bilaterally. GI: Soft, nontender, non-distended  MS: No edema; No deformity. Neuro:  Nonfocal  Psych: Normal affect   Labs    High Sensitivity Troponin:   Recent Labs  Lab 08/13/19 1535 08/13/19 1709  TROPONINIHS 1,177* 1,303*      Chemistry Recent Labs  Lab 08/09/19 0831 08/12/19 1147  08/13/19 0456  NA 141  --  143  K 5.0  --  4.0  CL 106  --  111  CO2 21  --  23  GLUCOSE 59*  --  80  BUN 18  --  11  CREATININE 0.85 0.88 0.89  CALCIUM 9.3  --  8.8*  GFRNONAA 72 >60 >60  GFRAA 83 >60 >60  ANIONGAP  --   --  9     Hematology Recent Labs  Lab 08/12/19 1147 08/13/19 0456 08/14/19 0017  WBC 5.4 5.0 6.5  RBC 4.12 4.12 3.98  HGB 9.1* 9.2* 8.8*  HCT 31.5* 30.9* 29.3*  MCV 76.5* 75.0* 73.6*  MCH 22.1* 22.3* 22.1*  MCHC 28.9* 29.8* 30.0  RDW 23.8* 23.7* 23.1*  PLT 345 326 327    BNPNo results for input(s): BNP, PROBNP in the last 168 hours.   DDimer No results for input(s): DDIMER in the last 168 hours.   Radiology    No results found.  Cardiac Studies     Patient Profile     Tamara Skalicky Brandonis a 64 y.o.femalewith a hx of HTN,HLD, DM 2, fibromyalgia andCAD s/p  CABG 2006 with LIMA-LAD, SVG-distal LAD. Repeat cardiac catheterization in 2009 showed widely patent grafts. She has significant headache on Imdur. She underwent cardiac catheterization by Dr. Ellyn Hack in July 2018 demonstrating 80% proximal RCA lesion treated with 2.5 x 8 mm DES, she was also found to have atretic LIMA to LAD with100% occlusion to mid LAD, SVG to distal LAD was normal. Due to worsening fatigue and exertional symptoms, she underwent another cardiac catheterization on 06/25/2018, this showed 100% proximal to mid LAD occlusion, 70% ostial OM 2, patent left circumflex stent, 90% ostial D1 lesion treated with a 2.5 x 16 mm DES, EF 55 to 65%.  Admitted with chest pain.   Assessment & Plan    1. Chest pain/CAD - extensive CAD history as reported above, with prior CABG and stenting.  - referred for cath after being seen in clinic with progressing chest pain - cath this admit: prox LAD CTO, D1 90%, LCX prox 40%, OM2 70%, RCA mid 75%. LIMA-LAD atretic that is chronic, SVG-mid LAD patent.  - received DES to prox LAD/ostial diag, DES to D1 - from notes if recurrent angina  consider atherectomy of mid RCA   - medical therapy with ASA 81, lopressor 25mg  bid, ramipril 5mg , simva 20, brillinta 90mg  bid, nitro gtt, hep gtt.  - from notes headaches on imdur previously but tolerating NG gtt  - Friday new anteroseptal TWIs and subscapular pain. Dr Debara Pickett reviewed films with Dr Tamala Julian, thought likely occluded a septal perforator due to plaque shift.   - ongoing chest pains yesterday, Dr Lovena Le discussed with Dr Claiborne Billings whether needed to go back to cath yesterday but plans were for medical therapy. Crosscover note from overnight fellow for recurrent chest pain, discussed with interventional at that time and recs for continued medical therapy, symptoms improved with IV morphine.  - trop 1177-->1303. EKG this AM SR, RBBB, nonspecific ST/T changes  - continue medical therapy today, will need to reassess with interventional cardiology on Monday pending her symptoms whether a repeat cath is warranted. Will make npo at midnight, I have not placed cath orders.     Carlyle Dolly MD       For questions or updates, please contact [facility] Please consult [email or web address] for contact info under        Signed, Carlyle Dolly, MD  08/14/2019, 8:26 AM

## 2019-08-14 NOTE — Progress Notes (Signed)
ANTICOAGULATION CONSULT NOTE   Pharmacy Consult for Heparin  Indication: chest pain/ACS  Allergies  Allergen Reactions  . Codeine Hives and Shortness Of Breath       . Other Anaphylaxis    Nuts  . Ciprofloxacin Itching  . Valsartan Rash  . Adhesive [Tape] Itching    "clear tape" used after surgery."took all her skin off".  Paper take is ok  . Sulfa Antibiotics Nausea Only    Patient Measurements: Height: 5\' 5"  (165.1 cm) Weight: 74.6 kg (164 lb 8 oz) IBW/kg (Calculated) : 57  Vital Signs: Temp: 98.3 F (36.8 C) (06/05 2105) Temp Source: Oral (06/05 2105) BP: 115/69 (06/06 0702) Pulse Rate: 79 (06/06 0614)  Labs: Recent Labs    08/12/19 1147 08/12/19 1147 08/13/19 0456 08/13/19 1535 08/13/19 1709 08/14/19 0017  HGB 9.1*   < > 9.2*  --   --  8.8*  HCT 31.5*  --  30.9*  --   --  29.3*  PLT 345  --  326  --   --  327  HEPARINUNFRC  --   --   --   --   --  1.09*  CREATININE 0.88  --  0.89  --   --   --   TROPONINIHS  --   --   --  1,177* 1,303*  --    < > = values in this interval not displayed.    Estimated Creatinine Clearance: 63.7 mL/min (by C-G formula based on SCr of 0.89 mg/dL).   Medical History: Past Medical History:  Diagnosis Date  . Arthritis    "in my back" (05/06/2017)  . CAD (coronary artery disease)    CABG 2006, 09/2016 DES RCA, 04/2017 DES Circ, 06/2018 DES 1st diag  . Chronic back pain   . Coronary artery disease    a. s/p CABG in 2006 with LIMA-LAD, SVG-dLAD. b. cath in 2009 showing patent grafts. c. 09/2016: cath with occluded LAD but patent SVG-dLAD. LIMA was atretic. pRCA with 80% stenosis --> treated with DES  . Fibromyalgia   . GERD (gastroesophageal reflux disease)   . Hyperlipidemia   . Hypertension   . Type II diabetes mellitus (HCC)     Assessment: 64 yo female with CP and elevated troponin. She is also noted s/p cath on 6/4 with stent placed on ticagrelor. Pharmacy consulted to dose heparin.  No anticoagulants noted PTA.   Heparin level remains supra-therapeutic at 0.98 after holding for 1 hour and decreasing rate to 800 units/hr. Will decrease rate by 150 units/hr. Hgb 9.2>>8.8, plts wnl and no bleeding or infusion issues per RN. Cards will reassess need for repeat cath on Monday, no cath orders placed yet.   Goal of Therapy:  Heparin level 0.3-0.7 units/ml Monitor platelets by anticoagulation protocol: Yes   Plan:  Decrease heparin to 650 units/hr Check 6 hr HL Daily HL and CBC  Monday 6/7, Cards will reassess need for repeat Cath   8/7, PharmD PGY1 Ambulatory Care Resident [employer] # [phone]

## 2019-08-14 NOTE — Progress Notes (Addendum)
Patient notified nurse CP that has been gone since she received morphine 4 mg IV this morning, is returning, rating 6/10.  BP 93/53, HR 83.  Patient on NTG gtt at 70 mcg/min, parameters are to keep SBP 100-110 mmhg.  Dr. Wyline Mood notified, order to hold dose of lopressor for SBP 93, and given IV morphine 4 mg.  Will continue to monitor for changes.

## 2019-08-14 NOTE — Progress Notes (Signed)
ANTICOAGULATION CONSULT NOTE   Pharmacy Consult for Heparin  Indication: chest pain/ACS  Allergies  Allergen Reactions  . Codeine Hives and Shortness Of Breath       . Other Anaphylaxis    Nuts  . Ciprofloxacin Itching  . Valsartan Rash  . Adhesive [Tape] Itching    "clear tape" used after surgery."took all her skin off".  Paper take is ok  . Sulfa Antibiotics Nausea Only    Patient Measurements: Height: 5\' 5"  (165.1 cm) Weight: 74.6 kg (164 lb 8 oz) IBW/kg (Calculated) : 57  Vital Signs: Temp: 98.3 F (36.8 C) (06/05 2105) Temp Source: Oral (06/05 2105) BP: 100/59 (06/06 0036) Pulse Rate: 71 (06/06 0036)  Labs: Recent Labs    08/12/19 1147 08/12/19 1147 08/13/19 0456 08/13/19 1535 08/13/19 1709 08/14/19 0017  HGB 9.1*   < > 9.2*  --   --  8.8*  HCT 31.5*  --  30.9*  --   --  29.3*  PLT 345  --  326  --   --  327  HEPARINUNFRC  --   --   --   --   --  1.09*  CREATININE 0.88  --  0.89  --   --   --   TROPONINIHS  --   --   --  1,177* 1,303*  --    < > = values in this interval not displayed.    Estimated Creatinine Clearance: 63.7 mL/min (by C-G formula based on SCr of 0.89 mg/dL).   Medical History: Past Medical History:  Diagnosis Date  . Arthritis    "in my back" (05/06/2017)  . CAD (coronary artery disease)    CABG 2006, 09/2016 DES RCA, 04/2017 DES Circ, 06/2018 DES 1st diag  . Chronic back pain   . Coronary artery disease    a. s/p CABG in 2006 with LIMA-LAD, SVG-dLAD. b. cath in 2009 showing patent grafts. c. 09/2016: cath with occluded LAD but patent SVG-dLAD. LIMA was atretic. pRCA with 80% stenosis --> treated with DES  . Fibromyalgia   . GERD (gastroesophageal reflux disease)   . Hyperlipidemia   . Hypertension   . Type II diabetes mellitus (HCC)     Assessment: 63 yo female with CP and elevated troponin. She is also noted s/p cath on 6/4 with stent placed on ticagrelor. Pharmacy consulted to dose heparin.  No anticoagulants noted PTA. -She  was on Sq heparin with last dose at 2:30pm -Hg= 9.2  6/6 AM update:  Heparin level elevated No issues per RN  Goal of Therapy:  Heparin level 0.3-0.7 units/ml Monitor platelets by anticoagulation protocol: Yes   Plan:  Hold heparin x 1 hr Re-start heparin at 800 units/hr at 0230 1030 heparin level  8/4, PharmD, BCPS Clinical Pharmacist Phone: [phone]

## 2019-08-15 ENCOUNTER — Other Ambulatory Visit (HOSPITAL_COMMUNITY): Payer: Commercial Managed Care - PPO

## 2019-08-15 DIAGNOSIS — I214 Non-ST elevation (NSTEMI) myocardial infarction: Secondary | ICD-10-CM

## 2019-08-15 LAB — CBC
HCT: 34.4 % — ABNORMAL LOW (ref 36.0–46.0)
Hemoglobin: 9.9 g/dL — ABNORMAL LOW (ref 12.0–15.0)
MCH: 21.7 pg — ABNORMAL LOW (ref 26.0–34.0)
MCHC: 28.8 g/dL — ABNORMAL LOW (ref 30.0–36.0)
MCV: 75.3 fL — ABNORMAL LOW (ref 80.0–100.0)
Platelets: 336 10*3/uL (ref 150–400)
RBC: 4.57 MIL/uL (ref 3.87–5.11)
RDW: 23.2 % — ABNORMAL HIGH (ref 11.5–15.5)
WBC: 6.3 10*3/uL (ref 4.0–10.5)
nRBC: 0 % (ref 0.0–0.2)

## 2019-08-15 LAB — HEPARIN LEVEL (UNFRACTIONATED): Heparin Unfractionated: 0.4 IU/mL (ref 0.30–0.70)

## 2019-08-15 MED ORDER — SODIUM CHLORIDE 0.9 % IV SOLN: 250.0000 mL | INTRAVENOUS | Status: DC | PRN

## 2019-08-15 MED ORDER — SODIUM CHLORIDE 0.9% FLUSH: 3.0000 mL | INTRAVENOUS | Status: DC | PRN

## 2019-08-15 MED ORDER — SODIUM CHLORIDE 0.9 % WEIGHT BASED INFUSION
3.0000 mL/kg/h | INTRAVENOUS | Status: DC
  Administered 2019-08-16: 3 mL/kg/h via INTRAVENOUS

## 2019-08-15 MED ORDER — ASPIRIN 81 MG PO CHEW
81.0000 mg | CHEWABLE_TABLET | ORAL | Status: AC
  Administered 2019-08-16: 81 mg via ORAL
  Filled 2019-08-15: qty 1

## 2019-08-15 MED ORDER — SODIUM CHLORIDE 0.9% FLUSH
3.0000 mL | Freq: Two times a day (BID) | INTRAVENOUS | Status: DC
  Administered 2019-08-15: 3 mL via INTRAVENOUS

## 2019-08-15 MED ORDER — SODIUM CHLORIDE 0.9 % WEIGHT BASED INFUSION
1.0000 mL/kg/h | INTRAVENOUS | Status: DC
  Administered 2019-08-16: 1 mL/kg/h via INTRAVENOUS

## 2019-08-15 MED ORDER — MORPHINE SULFATE (PF) 2 MG/ML IV SOLN: 2.0000 mg | INTRAVENOUS | Status: DC | PRN

## 2019-08-15 MED ORDER — DOCUSATE SODIUM 100 MG PO CAPS
200.0000 mg | ORAL_CAPSULE | Freq: Two times a day (BID) | ORAL | Status: DC | PRN
  Filled 2019-08-15: qty 2

## 2019-08-15 MED FILL — Nitroglycerin IV Soln 100 MCG/ML in D5W: INTRA_ARTERIAL | Qty: 10 | Status: AC

## 2019-08-15 NOTE — Progress Notes (Signed)
ANTICOAGULATION CONSULT NOTE   Pharmacy Consult for Heparin  Indication: chest pain/ACS  Allergies  Allergen Reactions  . Codeine Hives and Shortness Of Breath       . Other Anaphylaxis    Nuts  . Ciprofloxacin Itching  . Valsartan Rash  . Adhesive [Tape] Itching    "clear tape" used after surgery."took all her skin off".  Paper take is ok  . Sulfa Antibiotics Nausea Only    Patient Measurements: Height: 5\' 5"  (165.1 cm) Weight: 74.3 kg (163 lb 14.4 oz) IBW/kg (Calculated) : 57  Vital Signs: Temp: 97.6 F (36.4 C) (06/07 0419) Temp Source: Oral (06/07 0419) BP: 103/55 (06/07 0653) Pulse Rate: 66 (06/07 0653)  Labs: Recent Labs    08/12/19 1147 08/12/19 1147 08/13/19 0456 08/13/19 0456 08/13/19 1535 08/13/19 1709 08/14/19 0017 08/14/19 0017 08/14/19 0933 08/14/19 1648 08/15/19 0416  HGB 9.1*   < > 9.2*   < >  --   --  8.8*  --   --   --  9.9*  HCT 31.5*   < > 30.9*  --   --   --  29.3*  --   --   --  34.4*  PLT 345   < > 326  --   --   --  327  --   --   --  336  HEPARINUNFRC  --   --   --   --   --   --  1.09*   < > 0.98* 0.65 0.40  CREATININE 0.88  --  0.89  --   --   --   --   --   --   --   --   TROPONINIHS  --   --   --   --  1,177* 1,303*  --   --  520*  --   --    < > = values in this interval not displayed.    Estimated Creatinine Clearance: 63.6 mL/min (by C-G formula based on SCr of 0.89 mg/dL).   Medical History: Past Medical History:  Diagnosis Date  . Arthritis    "in my back" (05/06/2017)  . CAD (coronary artery disease)    CABG 2006, 09/2016 DES RCA, 04/2017 DES Circ, 06/2018 DES 1st diag  . Chronic back pain   . Coronary artery disease    a. s/p CABG in 2006 with LIMA-LAD, SVG-dLAD. b. cath in 2009 showing patent grafts. c. 09/2016: cath with occluded LAD but patent SVG-dLAD. LIMA was atretic. pRCA with 80% stenosis --> treated with DES  . Fibromyalgia   . GERD (gastroesophageal reflux disease)   . Hyperlipidemia   . Hypertension   .  Type II diabetes mellitus (HCC)     Assessment: 64 yo female with CP and elevated troponin. She is also noted s/p cath on 6/4 with stent placed on ticagrelor. Pharmacy consulted to dose heparin.  No anticoagulants noted PTA. Cards will reassess need for repeat cath on Monday  Heparin level remains therapeutic, CBC low but stable. Possible relook cath this week.   Goal of Therapy:  Heparin level 0.3-0.7 units/ml Monitor platelets by anticoagulation protocol: Yes   Plan:  Continue heparin at 650 units/hr Daily HL and CBC   Friday, PharmD, BCPS Clinical Pharmacist [phone] Please check AMION for all [facility] Pharmacy numbers 08/15/2019

## 2019-08-15 NOTE — Progress Notes (Signed)
6/6 @ 23:00 hypotensive standing order placed and on call provider paged. NS 500 mL bolus given and IV nitro titrated down with BP normalized. Rapid response at bedside. MD decreased morphine to 2 mg. Will continue to monitor

## 2019-08-15 NOTE — TOC Benefit Eligibility Note (Addendum)
Transition of Care [facility]) Benefit Eligibility Note    Patient Details  Name: Tamara Nunez MRN: [id number] Date of Birth: [birth year]   Medication/Dose: BRILINTA  90 MG BID  Covered?: Yes  Tier: 2 Drug  Prescription Coverage Preferred Pharmacy: [facility]  Spoke with Person/Company/Phone  Number : [employer]  RX # [phone]  Co-Pay: $35.00  Prior Approval: No  Deductible: Met  Additional Notes: TICAGRELOR: Crecencio Mc Phone Number: 08/15/2019, 4:03 PM

## 2019-08-15 NOTE — Progress Notes (Signed)
Progress Note  Patient Name: Tamara Nunez Date of Encounter: 08/15/2019  [facility] Cardiologist: Pixie Casino, MD   Subjective   Complaining of 5/10 chest pain, still feels like "something is not right."  She is more comfortable than yesterday.  No shortness of breath or other complaints at present.  She feels like she had a "out of body experience" last night with lightheadedness associated.  Inpatient Medications    Scheduled Meds: . aspirin EC  81 mg Oral Daily  . furosemide  40 mg Oral Daily  . gabapentin  600 mg Oral TID  . loratadine  10 mg Oral Daily  . metoprolol tartrate  25 mg Oral Q6H  . polyethylene glycol  17 g Oral Daily  . ramipril  5 mg Oral QHS  . ranolazine  1,000 mg Oral BID  . simvastatin  20 mg Oral QHS  . sodium chloride flush  3 mL Intravenous Q12H  . ticagrelor  90 mg Oral BID   Continuous Infusions: . sodium chloride    . heparin 650 Units/hr (08/15/19 0059)  . nitroGLYCERIN 5 mcg/min (08/14/19 2315)   PRN Meds: sodium chloride, acetaminophen, albuterol, morphine injection, nitroGLYCERIN, ondansetron (ZOFRAN) IV, pantoprazole, sodium chloride flush   Vital Signs    Vitals:   08/15/19 0055 08/15/19 0300 08/15/19 0419 08/15/19 0653  BP: (!) 103/53 (!) 93/47 (!) 94/53 (!) 103/55  Pulse: 70  69 66  Resp:   16   Temp:   97.6 F (36.4 C)   TempSrc:   Oral   SpO2:   100%   Weight:   74.3 kg   Height:        Intake/Output Summary (Last 24 hours) at 08/15/2019 0817 Last data filed at 08/15/2019 0427 Gross per 24 hour  Intake 1684.53 ml  Output 600 ml  Net 1084.53 ml   Last 3 Weights 08/15/2019 08/13/2019 08/12/2019  Weight (lbs) 163 lb 14.4 oz 164 lb 8 oz 164 lb  Weight (kg) 74.345 kg 74.617 kg 74.39 kg      Telemetry    Normal sinus rhythm/sinus bradycardia- Personally Reviewed  ECG    Normal sinus rhythm 76 bpm, right bundle branch block- Personally Reviewed  Physical Exam  Alert, oriented, pleasant woman in no distress GEN:  No acute distress.   Neck: No JVD Cardiac: RRR, no murmurs, rubs, or gallops.  Respiratory: Clear to auscultation bilaterally. GI: Soft, nontender, non-distended  MS: No edema; No deformity.  Right radial site is clear, dressing is removed this morning.  Right femoral pulses 2+ Neuro:  Nonfocal  Psych: Normal affect   Labs    High Sensitivity Troponin:   Recent Labs  Lab 08/13/19 1535 08/13/19 1709 08/14/19 0933  TROPONINIHS 1,177* 1,303* 520*      Chemistry Recent Labs  Lab 08/09/19 0831 08/12/19 1147 08/13/19 0456  NA 141  --  143  K 5.0  --  4.0  CL 106  --  111  CO2 21  --  23  GLUCOSE 59*  --  80  BUN 18  --  11  CREATININE 0.85 0.88 0.89  CALCIUM 9.3  --  8.8*  GFRNONAA 72 >60 >60  GFRAA 83 >60 >60  ANIONGAP  --   --  9     Hematology Recent Labs  Lab 08/13/19 0456 08/14/19 0017 08/15/19 0416  WBC 5.0 6.5 6.3  RBC 4.12 3.98 4.57  HGB 9.2* 8.8* 9.9*  HCT 30.9* 29.3* 34.4*  MCV 75.0*  73.6* 75.3*  MCH 22.3* 22.1* 21.7*  MCHC 29.8* 30.0 28.8*  RDW 23.7* 23.1* 23.2*  PLT 326 327 336    BNPNo results for input(s): BNP, PROBNP in the last 168 hours.   DDimer No results for input(s): DDIMER in the last 168 hours.   Radiology    No results found.  Cardiac Studies   Cardiac catheterization 08/12/2019: Conclusion    Prox LAD-1 lesion is 25% stenosed.  Post intervention, there is a 0% residual stenosis.  Prox LAD-2 lesion is 100% stenosed.  Origin to Mid Graft lesion is 99% stenosed.  Mid RCA to Dist RCA lesion is 75% stenosed.  A stent was successfully placed.  A stent was successfully placed.    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  Ostial LAD stent with focal ISR at the ostium to proximal diagonal region.  Stenosis is greater than 90%.  The LAD is totally occluded beyond the origin of the first diagonal.  Circumflex is large and has irregularities with up to 50 to 60%  mid and 30% distal distal native disease.  The proximal stent is widely patent and may extend slightly into the distal left main.  The right coronary is dominant and has diffuse heavily calcified disease throughout the mid vessel and up to 75% stenosis.    LV function is normal.  LVEDP is normal.  Successful but technically difficult angioplasty followed by re-stenting of the ostial diagonal.  A 3.0 x 8 Onyx was overlapped with the proximal one fourth of the previously placed stent and deployed at 14 atm x 2 with TIMI grade III flow.  Recommendations:   If angina continues, consider orbital atherectomy of the mid segment of the right coronary.  Will need to be femoral approach.  Aggressive risk factor modification.  Continue aspirin and Plavix.  Discharge in a.m. if stable.   Diagnostic Dominance: Right  Intervention    Patient Profile     64 y.o. female with extensive CAD status post CABG and multiple PCI procedures, presents with non-STEMI and undergoes cath and PCI 08/11/2019.  Assessment & Plan    1.  Non-STEMI: Patient underwent cardiac catheterization and PCI of the LAD and to a first diagonal branch with a drug-eluting stent on August 12, 2019.  She is found to have occlusion of the septal perforator post PCI.  Unfortunately she continues to have chest discomfort at rest.  I have discussed her case with Dr. Tamala Julian and have reviewed her cardiac catheterization films.  She has diffuse calcified 75 to 80% stenosis of the mid RCA.  We agree that it is appropriate to perform atherectomy and PCI of the RCA in the setting of ongoing chest discomfort despite IV nitroglycerin and IV heparin.  This will likely be done from a femoral approach.  I have reviewed risks, indications, potential benefit, and alternatives with the patient.  We will plan to schedule her procedure tomorrow.  All of her questions are answered this morning.  I have reviewed the risks, indications, and alternatives to  cardiac catheterization, possible angioplasty, and stenting with the patient. Risks include but are not limited to bleeding, infection, vascular injury, stroke, myocardial infection, arrhythmia, kidney injury, radiation-related injury in the case of prolonged fluoroscopy use, emergency cardiac surgery, and death. The patient understands the risks of serious complication is 1-2 in 1324 with diagnostic cardiac cath and 1-2% or less with angioplasty/stenting.  Considering her ongoing chest discomfort,  will make sure that there is a relook of her left coronary artery at the time of catheterization.  For questions or updates, please contact [place] Please consult [email or web address] for contact info under     Signed, Sherren Mocha, MD  08/15/2019, 8:17 AM

## 2019-08-15 NOTE — Progress Notes (Signed)
CARDIAC REHAB PHASE I   PRE:  Rate/Rhythm: 65 SR  BP:  Supine: 112/50  Sitting: 126/54  Standing:    SaO2: 100% 2L  MODE:  Ambulation: 430 ft   POST:  Rate/Rhythm: 88SR  BP:  Supine:   Sitting: 123/50  Standing:    SaO2: 100%RA 0840-0930 Came to see pt to continue ed. Briefly added some diet ed. Pt requesting to walk. Asked Dr Excell Seltzer and permission given to walk. Pt walked 430 ft on RA with asst x 1 stopping once to rest. CP was 4 prior to walk and did not increase. To sitting on side of bed. Coffee given. Notified pt's RN that pt would like to order breakfast.  Will continue to follow.    Luetta Nutting, RN BSN  08/15/2019 9:28 AM

## 2019-08-15 NOTE — Care Management (Signed)
Eligibility sent for Brilinta 

## 2019-08-16 ENCOUNTER — Inpatient Hospital Stay (HOSPITAL_COMMUNITY)
Admission: AD | Disposition: A | Discharge status: Home / Self Care | Payer: Self-pay | Source: Ambulatory Visit | Attending: Interventional Cardiology

## 2019-08-16 ENCOUNTER — Other Ambulatory Visit (HOSPITAL_COMMUNITY): Payer: Commercial Managed Care - PPO

## 2019-08-16 DIAGNOSIS — E785 Hyperlipidemia, unspecified: Secondary | ICD-10-CM

## 2019-08-16 DIAGNOSIS — I10 Essential (primary) hypertension: Secondary | ICD-10-CM

## 2019-08-16 HISTORY — PX: CORONARY STENT INTERVENTION: CATH118234

## 2019-08-16 HISTORY — PX: CORONARY ATHERECTOMY: CATH118238

## 2019-08-16 LAB — CBC
HCT: 31.8 % — ABNORMAL LOW (ref 36.0–46.0)
Hemoglobin: 9.6 g/dL — ABNORMAL LOW (ref 12.0–15.0)
MCH: 22.5 pg — ABNORMAL LOW (ref 26.0–34.0)
MCHC: 30.2 g/dL (ref 30.0–36.0)
MCV: 74.6 fL — ABNORMAL LOW (ref 80.0–100.0)
Platelets: 314 10*3/uL (ref 150–400)
RBC: 4.26 MIL/uL (ref 3.87–5.11)
RDW: 22.6 % — ABNORMAL HIGH (ref 11.5–15.5)
WBC: 5.8 10*3/uL (ref 4.0–10.5)
nRBC: 0 % (ref 0.0–0.2)

## 2019-08-16 LAB — POCT ACTIVATED CLOTTING TIME
Activated Clotting Time: 467 seconds
Activated Clotting Time: 224 s
Activated Clotting Time: 197 s
Activated Clotting Time: 180 s

## 2019-08-16 LAB — HEPARIN LEVEL (UNFRACTIONATED): Heparin Unfractionated: 0.36 IU/mL (ref 0.30–0.70)

## 2019-08-16 LAB — BASIC METABOLIC PANEL
Anion gap: 9 (ref 5–15)
BUN: 24 mg/dL — ABNORMAL HIGH (ref 8–23)
CO2: 23 mmol/L (ref 22–32)
Calcium: 8.8 mg/dL — ABNORMAL LOW (ref 8.9–10.3)
Chloride: 107 mmol/L (ref 98–111)
Creatinine, Ser: 0.92 mg/dL (ref 0.44–1.00)
GFR calc Af Amer: >60 mL/min (ref >60)
GFR calc non Af Amer: >60 mL/min (ref >60)
Glucose, Bld: 83 mg/dL (ref 70–99)
Potassium: 4.5 mmol/L (ref 3.5–5.1)
Sodium: 139 mmol/L (ref 135–145)

## 2019-08-16 LAB — GLUCOSE, CAPILLARY
Glucose-Capillary: 51 mg/dL — ABNORMAL LOW (ref 70–99)
Glucose-Capillary: 108 mg/dL — ABNORMAL HIGH (ref 70–99)
Glucose-Capillary: 64 mg/dL — ABNORMAL LOW (ref 70–99)
Glucose-Capillary: 125 mg/dL — ABNORMAL HIGH (ref 70–99)

## 2019-08-16 SURGERY — CORONARY ATHERECTOMY
Anesthesia: LOCAL

## 2019-08-16 MED ORDER — METOPROLOL TARTRATE 25 MG PO TABS: 25.0000 mg | ORAL_TABLET | Freq: Two times a day (BID) | ORAL | Status: DC

## 2019-08-16 MED ORDER — ATORVASTATIN CALCIUM 40 MG PO TABS
40.0000 mg | ORAL_TABLET | Freq: Every day | ORAL | Status: DC
  Administered 2019-08-16 – 2019-08-17 (×2): 40 mg via ORAL
  Filled 2019-08-16 (×2): qty 1

## 2019-08-16 MED ORDER — RAMIPRIL 2.5 MG PO CAPS
2.5000 mg | ORAL_CAPSULE | Freq: Every day | ORAL | Status: DC
  Administered 2019-08-16: 2.5 mg via ORAL
  Filled 2019-08-16 (×2): qty 1

## 2019-08-16 MED ORDER — SODIUM CHLORIDE 0.9 % IV SOLN
INTRAVENOUS | Status: AC | PRN
  Administered 2019-08-16: 10 mL/h via INTRAVENOUS

## 2019-08-16 MED ORDER — METOPROLOL TARTRATE 50 MG PO TABS
50.0000 mg | ORAL_TABLET | Freq: Two times a day (BID) | ORAL | Status: DC
  Administered 2019-08-16 – 2019-08-17 (×2): 50 mg via ORAL
  Filled 2019-08-16 (×2): qty 1

## 2019-08-16 MED ORDER — DEXTROSE 50 % IV SOLN
INTRAVENOUS | Status: AC
  Filled 2019-08-16: qty 50

## 2019-08-16 MED ORDER — NITROGLYCERIN 1 MG/10 ML FOR IR/CATH LAB
INTRA_ARTERIAL | Status: DC | PRN
  Administered 2019-08-16 (×2): 200 ug via INTRACORONARY

## 2019-08-16 MED ORDER — SODIUM CHLORIDE 0.9 % IV SOLN: 250.0000 mL | INTRAVENOUS | Status: DC | PRN

## 2019-08-16 MED ORDER — HEPARIN SODIUM (PORCINE) 5000 UNIT/ML IJ SOLN
5000.0000 [IU] | Freq: Three times a day (TID) | INTRAMUSCULAR | Status: DC
  Administered 2019-08-17: 5000 [IU] via SUBCUTANEOUS
  Filled 2019-08-16: qty 1

## 2019-08-16 MED ORDER — SODIUM CHLORIDE 0.9% FLUSH
3.0000 mL | Freq: Two times a day (BID) | INTRAVENOUS | Status: DC
  Administered 2019-08-17: 3 mL via INTRAVENOUS

## 2019-08-16 MED ORDER — MIDAZOLAM HCL 2 MG/2ML IJ SOLN
INTRAMUSCULAR | Status: AC
  Filled 2019-08-16: qty 2

## 2019-08-16 MED ORDER — HEPARIN SODIUM (PORCINE) 1000 UNIT/ML IJ SOLN
INTRAMUSCULAR | Status: DC | PRN
  Administered 2019-08-16: 8000 [IU] via INTRAVENOUS

## 2019-08-16 MED ORDER — FENTANYL CITRATE (PF) 100 MCG/2ML IJ SOLN
INTRAMUSCULAR | Status: DC | PRN
  Administered 2019-08-16 (×3): 25 ug via INTRAVENOUS

## 2019-08-16 MED ORDER — NITROGLYCERIN 1 MG/10 ML FOR IR/CATH LAB
INTRA_ARTERIAL | Status: AC
  Filled 2019-08-16: qty 10

## 2019-08-16 MED ORDER — HEPARIN (PORCINE) IN NACL 1000-0.9 UT/500ML-% IV SOLN
INTRAVENOUS | Status: AC
  Filled 2019-08-16: qty 1500

## 2019-08-16 MED ORDER — HEPARIN SODIUM (PORCINE) 1000 UNIT/ML IJ SOLN
INTRAMUSCULAR | Status: AC
  Filled 2019-08-16: qty 1

## 2019-08-16 MED ORDER — IOHEXOL 350 MG/ML SOLN
INTRAVENOUS | Status: DC | PRN
  Administered 2019-08-16: 90 mL

## 2019-08-16 MED ORDER — DEXTROSE 50 % IV SOLN
50.0000 mL | Freq: Once | INTRAVENOUS | Status: AC
  Administered 2019-08-16: 50 mL via INTRAVENOUS

## 2019-08-16 MED ORDER — SODIUM CHLORIDE 0.9 % WEIGHT BASED INFUSION: 1.0000 mL/kg/h | INTRAVENOUS | Status: AC

## 2019-08-16 MED ORDER — IOHEXOL 350 MG/ML SOLN
INTRAVENOUS | Status: AC
  Filled 2019-08-16: qty 1

## 2019-08-16 MED ORDER — LIDOCAINE HCL (PF) 1 % IJ SOLN
INTRAMUSCULAR | Status: AC
  Filled 2019-08-16: qty 30

## 2019-08-16 MED ORDER — HEPARIN (PORCINE) IN NACL 1000-0.9 UT/500ML-% IV SOLN
INTRAVENOUS | Status: DC | PRN
  Administered 2019-08-16 (×3): 500 mL

## 2019-08-16 MED ORDER — FENTANYL CITRATE (PF) 100 MCG/2ML IJ SOLN
INTRAMUSCULAR | Status: AC
  Filled 2019-08-16: qty 2

## 2019-08-16 MED ORDER — MIDAZOLAM HCL 2 MG/2ML IJ SOLN
INTRAMUSCULAR | Status: DC | PRN
  Administered 2019-08-16: 1 mg via INTRAVENOUS
  Administered 2019-08-16: 2 mg via INTRAVENOUS
  Administered 2019-08-16: 1 mg via INTRAVENOUS

## 2019-08-16 MED ORDER — SODIUM CHLORIDE 0.9% FLUSH: 3.0000 mL | INTRAVENOUS | Status: DC | PRN

## 2019-08-16 MED ORDER — LIDOCAINE HCL (PF) 1 % IJ SOLN
INTRAMUSCULAR | Status: DC | PRN
  Administered 2019-08-16: 15 mL

## 2019-08-16 MED ORDER — VIPERSLIDE LUBRICANT OPTIME: TOPICAL | Status: DC | PRN

## 2019-08-16 SURGICAL SUPPLY — 25 items
BALLN SAPPHIRE 2.25X15 (BALLOONS) ×2
BALLN SAPPHIRE ~~LOC~~ 3.0X18 (BALLOONS) ×1 IMPLANT
BALLOON SAPPHIRE 2.25X15 (BALLOONS) IMPLANT
CABLE ADAPT PACING TEMP 12FT (ADAPTER) ×1 IMPLANT
CATH INFINITI 5FR JL4 (CATHETERS) ×1 IMPLANT
CATH LAUNCHER 6FR AL1 (CATHETERS) IMPLANT
CATH S G BIP PACING (CATHETERS) ×1 IMPLANT
CATH TELEPORT (CATHETERS) ×1 IMPLANT
CATHETER LAUNCHER 6FR AL1 (CATHETERS) ×2
CROWN DIAMONDBACK CLASSIC 1.25 (BURR) ×1 IMPLANT
DEVICE CONTINUOUS FLUSH (MISCELLANEOUS) ×1 IMPLANT
ELECT DEFIB PAD ADLT CADENCE (PAD) ×1 IMPLANT
KIT ENCORE 26 ADVANTAGE (KITS) ×1 IMPLANT
KIT HEART LEFT (KITS) ×2 IMPLANT
LUBRICANT VIPERSLIDE CORONARY (MISCELLANEOUS) ×1 IMPLANT
PACK CARDIAC CATHETERIZATION (CUSTOM PROCEDURE TRAY) ×2 IMPLANT
SHEATH PINNACLE 6F 10CM (SHEATH) ×2 IMPLANT
SHEATH PROBE COVER 6X72 (BAG) ×1 IMPLANT
STENT RESOLUTE ONYX 2.5X34 (Permanent Stent) ×1 IMPLANT
TRANSDUCER W/STOPCOCK (MISCELLANEOUS) ×2 IMPLANT
TUBING CIL FLEX 10 FLL-RA (TUBING) ×2 IMPLANT
WIRE ASAHI PROWATER 180CM (WIRE) ×1 IMPLANT
WIRE ASAHI PROWATER 300CM (WIRE) ×1 IMPLANT
WIRE EMERALD 3MM-J .035X150CM (WIRE) ×1 IMPLANT
WIRE VIPERWIRE COR FLEX .012 (WIRE) ×1 IMPLANT

## 2019-08-16 NOTE — Progress Notes (Signed)
PIV consult: Assessed by 2 IV Team nurses. Attempt with ultrasound unsuccessful. R arm IV infusing. No suitable vein found with ultrasound on LUE.

## 2019-08-16 NOTE — H&P (View-Only) (Signed)
Progress Note  Patient Name: Tamara Nunez Date of Encounter: 08/16/2019  [facility] Cardiologist: Pixie Casino, MD   Subjective   Still has 5 out of 10 chest pressure.  Complains of numbness and tingling on her fingers.  Inpatient Medications    Scheduled Meds: . aspirin EC  81 mg Oral Daily  . furosemide  40 mg Oral Daily  . gabapentin  600 mg Oral TID  . loratadine  10 mg Oral Daily  . metoprolol tartrate  25 mg Oral Q6H  . polyethylene glycol  17 g Oral Daily  . ramipril  5 mg Oral QHS  . ranolazine  1,000 mg Oral BID  . simvastatin  20 mg Oral QHS  . sodium chloride flush  3 mL Intravenous Q12H  . sodium chloride flush  3 mL Intravenous Q12H  . ticagrelor  90 mg Oral BID   Continuous Infusions: . sodium chloride 20 mL/hr at 08/15/19 0700  . sodium chloride    . sodium chloride 1 mL/kg/hr (08/16/19 0520)  . heparin 650 Units/hr (08/15/19 0059)  . nitroGLYCERIN Stopped (08/16/19 0218)   PRN Meds: sodium chloride, sodium chloride, acetaminophen, albuterol, docusate sodium, morphine injection, nitroGLYCERIN, ondansetron (ZOFRAN) IV, pantoprazole, sodium chloride flush, sodium chloride flush   Vital Signs    Vitals:   08/15/19 2020 08/16/19 0109 08/16/19 0219 08/16/19 0420  BP: 104/60 (!) 93/54 (!) 124/49 (!) 101/57  Pulse: 65 71  66  Resp: 15 14  15   Temp: 98.2 F (36.8 C) 98 F (36.7 C)  97.6 F (36.4 C)  TempSrc: Oral Oral  Oral  SpO2: 100% 100%  100%  Weight:    73.8 kg  Height:        Intake/Output Summary (Last 24 hours) at 08/16/2019 0750 Last data filed at 08/15/2019 2000 Gross per 24 hour  Intake 859.8 ml  Output 800 ml  Net 59.8 ml   Last 3 Weights 08/16/2019 08/15/2019 08/13/2019  Weight (lbs) 162 lb 12.8 oz 163 lb 14.4 oz 164 lb 8 oz  Weight (kg) 73.846 kg 74.345 kg 74.617 kg      Telemetry    NSR at 70-80s- Personally Reviewed  ECG    No new tracing   Physical Exam   GEN: No acute distress.   Neck: No JVD Cardiac: RRR, no  murmurs, rubs, or gallops.  Respiratory: Clear to auscultation bilaterally. GI: Soft, nontender, non-distended  MS: No edema; No deformity. Neuro:  Nonfocal  Psych: Normal affect   Labs    High Sensitivity Troponin:   Recent Labs  Lab 08/13/19 1535 08/13/19 1709 08/14/19 0933  TROPONINIHS 1,177* 1,303* 520*      Chemistry Recent Labs  Lab 08/09/19 0831 08/09/19 0831 08/12/19 1147 08/13/19 0456 08/16/19 0402  NA 141  --   --  143 139  K 5.0  --   --  4.0 4.5  CL 106  --   --  111 107  CO2 21  --   --  23 23  GLUCOSE 59*  --   --  80 83  BUN 18  --   --  11 24*  CREATININE 0.85   < > 0.88 0.89 0.92  CALCIUM 9.3  --   --  8.8* 8.8*  GFRNONAA 72   < > >60 >60 >60  GFRAA 83   < > >60 >60 >60  ANIONGAP  --   --   --  9 9   < > =  values in this interval not displayed.     Hematology Recent Labs  Lab 08/14/19 0017 08/15/19 0416 08/16/19 0402  WBC 6.5 6.3 5.8  RBC 3.98 4.57 4.26  HGB 8.8* 9.9* 9.6*  HCT 29.3* 34.4* 31.8*  MCV 73.6* 75.3* 74.6*  MCH 22.1* 21.7* 22.5*  MCHC 30.0 28.8* 30.2  RDW 23.1* 23.2* 22.6*  PLT 327 336 314    Radiology    No results found.  Cardiac Studies    CORONARY STENT INTERVENTION  08/12/2019  LEFT HEART CATH AND CORS/GRAFTS ANGIOGRAPHY  Conclusion    Prox LAD-1 lesion is 25% stenosed.  Post intervention, there is a 0% residual stenosis.  Prox LAD-2 lesion is 100% stenosed.  Origin to Mid Graft lesion is 99% stenosed.  Mid RCA to Dist RCA lesion is 75% stenosed.  A stent was successfully placed.  A stent was successfully placed.    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  Ostial LAD stent with focal ISR at the ostium to proximal diagonal region.  Stenosis is greater than 90%.  The LAD is totally occluded beyond the origin of the first diagonal.  Circumflex is large and has irregularities with up to 50 to 60% mid and 30% distal distal native disease.   The proximal stent is widely patent and may extend slightly into the distal left main.  The right coronary is dominant and has diffuse heavily calcified disease throughout the mid vessel and up to 75% stenosis.    LV function is normal.  LVEDP is normal.  Successful but technically difficult angioplasty followed by re-stenting of the ostial diagonal.  A 3.0 x 8 Onyx was overlapped with the proximal one fourth of the previously placed stent and deployed at 14 atm x 2 with TIMI grade III flow.  Recommendations:   If angina continues, consider orbital atherectomy of the mid segment of the right coronary.  Will need to be femoral approach.  Aggressive risk factor modification.  Continue aspirin and Plavix.  Discharge in a.m. if stable.    Diagnostic Dominance: Right  Intervention     Patient Profile     64 y.o. female with hx of CAD s/p CABG, HTN, HLD and DM presented for CP and found to have elevated troponin.   Assessment & Plan    1. NSTEMI - Hs-troponin 1177. Underwent stenting to LAD and first diagonal with DES on 08/12/2019. She is found to have occlusion of the septal perforator post PCI. Patient continues to chest pain. Case reviewed with interventional team>> Plan for arthrectomy of RCA today via femoral approach.  - Continue ASA, Brillinta, statin and ACE. On heparin.   2. HTN - BP soft.  - Consolidate metoprolol to50 mg BID  - Reduced Ramipril 2.8m qd  3. HLD - 04/27/2019: Cholesterol 162; HDL 89; LDL Cholesterol 63; Triglycerides 48  - Given extensive CAD, change Simvastatin to Lipitor 40  4.hypoglycemia - Recent A1c was 6.2. Few days ago her Blood sugar was 59. Now in 851s Some numbness and tingling on her fingers.  On gabapentin. -Follow-up blood sugar closely. -We will give clear liquid diet prior to cardiac catheterization.  Spoke with Cath Lab.  For questions or updates, please [place] [email or web address] for contact info  under    Signed,Leanor Kail PA  08/16/2019, 7:50 AM    Patient seen, examined. Available data reviewed. Agree with findings, assessment, and plan as outlined by  Vin Bhagat, PA-C.  The patient is alert, oriented, in no distress.  Lungs are clear, JVP is normal, heart is regular rate and rhythm with a 2/6 systolic murmur at the right sternal border, abdomen is soft and nontender, extremities have no edema.  The patient continues to have mild chest discomfort.  She appears comfortable on my evaluation.  She will undergo repeat cardiac catheterization with planned orbital atherectomy and PCI of the mid right coronary artery.  She seems to be tolerating aspirin and ticagrelor.  Her radial artery feels fairly small and it might be best to consider a transfemoral approach for her case.  Sherren Mocha, M.D. 08/16/2019 10:26 AM

## 2019-08-16 NOTE — Interval H&P Note (Signed)
History and Physical Interval Note:  08/16/2019 2:33 PM  Tamara Nunez  has presented today for surgery, with the diagnosis of coronary artery disease.  The various methods of treatment have been discussed with the patient and family. After consideration of risks, benefits and other options for treatment, the patient has consented to  Procedure(s): CORONARY ATHERECTOMY (N/A) as a surgical intervention.  The patient's history has been reviewed, patient examined, no change in status, stable for surgery.  I have reviewed the patient's chart and labs.  Questions were answered to the patient's satisfaction.   Cath Lab Visit (complete for each Cath Lab visit)  Clinical Evaluation Leading to the Procedure:   ACS: Yes.    Non-ACS:    Anginal Classification: CCS IV  Anti-ischemic medical therapy: Maximal Therapy (2 or more classes of medications)  Non-Invasive Test Results: No non-invasive testing performed  Prior CABG: Previous CABG        Theron Arista [facility] 08/16/2019 2:33 PM

## 2019-08-16 NOTE — Progress Notes (Signed)
[id number] Gave pt exercise guidelines and understanding voiced. Will continue to follow. Luetta Nutting RN BSN 08/16/2019 10:06 AM

## 2019-08-16 NOTE — Progress Notes (Signed)
ANTICOAGULATION CONSULT NOTE   Pharmacy Consult for Heparin  Indication: chest pain/ACS  Allergies  Allergen Reactions  . Codeine Hives and Shortness Of Breath       . Other Anaphylaxis    Nuts  . Ciprofloxacin Itching  . Valsartan Rash  . Adhesive [Tape] Itching    "clear tape" used after surgery."took all her skin off".  Paper take is ok  . Sulfa Antibiotics Nausea Only    Patient Measurements: Height: 5\' 5"  (165.1 cm) Weight: 73.8 kg (162 lb 12.8 oz) IBW/kg (Calculated) : 57  Vital Signs: Temp: 97.6 F (36.4 C) (06/08 0420) Temp Source: Oral (06/08 0420) BP: 101/57 (06/08 0420) Pulse Rate: 66 (06/08 0420)  Labs: Recent Labs    08/13/19 1535 08/13/19 1709 08/14/19 0017 08/14/19 0017 08/14/19 0933 08/14/19 0933 08/14/19 1648 08/15/19 0416 08/16/19 0402  HGB  --   --  8.8*   < >  --   --   --  9.9* 9.6*  HCT  --   --  29.3*  --   --   --   --  34.4* 31.8*  PLT  --   --  327  --   --   --   --  336 314  HEPARINUNFRC  --   --  1.09*   < > 0.98*   < > 0.65 0.40 0.36  CREATININE  --   --   --   --   --   --   --   --  0.92  TROPONINIHS 1,177* 1,303*  --   --  520*  --   --   --   --    < > = values in this interval not displayed.    Estimated Creatinine Clearance: 61.3 mL/min (by C-G formula based on SCr of 0.92 mg/dL).   Medical History: Past Medical History:  Diagnosis Date  . Arthritis    "in my back" (05/06/2017)  . CAD (coronary artery disease)    CABG 2006, 09/2016 DES RCA, 04/2017 DES Circ, 06/2018 DES 1st diag  . Chronic back pain   . Coronary artery disease    a. s/p CABG in 2006 with LIMA-LAD, SVG-dLAD. b. cath in 2009 showing patent grafts. c. 09/2016: cath with occluded LAD but patent SVG-dLAD. LIMA was atretic. pRCA with 80% stenosis --> treated with DES  . Fibromyalgia   . GERD (gastroesophageal reflux disease)   . Hyperlipidemia   . Hypertension   . Type II diabetes mellitus (HCC)     Assessment: 64 yo female with CP and elevated  troponin. She is also noted s/p cath on 6/4 with stent placed on ticagrelor. Pharmacy consulted to dose heparin.  No anticoagulants noted PTA. Cards will reassess need for repeat cath on Monday  Heparin level remains therapeutic at 0.36, CBC low but stable. Planning for staged PCI today.   Goal of Therapy:  Heparin level 0.3-0.7 units/ml Monitor platelets by anticoagulation protocol: Yes   Plan:  Continue heparin at 650 units/hr Daily heparin level and CBC    Tuesday, PharmD, BCPS Clinical Pharmacist [phone] Please check AMION for all [facility] Pharmacy numbers 08/16/2019

## 2019-08-16 NOTE — Progress Notes (Addendum)
Progress Note  Patient Name: Tamara Nunez Date of Encounter: 08/16/2019  [facility] Cardiologist: Pixie Casino, MD   Subjective   Still has 5 out of 10 chest pressure.  Complains of numbness and tingling on her fingers.  Inpatient Medications    Scheduled Meds: . aspirin EC  81 mg Oral Daily  . furosemide  40 mg Oral Daily  . gabapentin  600 mg Oral TID  . loratadine  10 mg Oral Daily  . metoprolol tartrate  25 mg Oral Q6H  . polyethylene glycol  17 g Oral Daily  . ramipril  5 mg Oral QHS  . ranolazine  1,000 mg Oral BID  . simvastatin  20 mg Oral QHS  . sodium chloride flush  3 mL Intravenous Q12H  . sodium chloride flush  3 mL Intravenous Q12H  . ticagrelor  90 mg Oral BID   Continuous Infusions: . sodium chloride 20 mL/hr at 08/15/19 0700  . sodium chloride    . sodium chloride 1 mL/kg/hr (08/16/19 0520)  . heparin 650 Units/hr (08/15/19 0059)  . nitroGLYCERIN Stopped (08/16/19 0218)   PRN Meds: sodium chloride, sodium chloride, acetaminophen, albuterol, docusate sodium, morphine injection, nitroGLYCERIN, ondansetron (ZOFRAN) IV, pantoprazole, sodium chloride flush, sodium chloride flush   Vital Signs    Vitals:   08/15/19 2020 08/16/19 0109 08/16/19 0219 08/16/19 0420  BP: 104/60 (!) 93/54 (!) 124/49 (!) 101/57  Pulse: 65 71  66  Resp: 15 14  15   Temp: 98.2 F (36.8 C) 98 F (36.7 C)  97.6 F (36.4 C)  TempSrc: Oral Oral  Oral  SpO2: 100% 100%  100%  Weight:    73.8 kg  Height:        Intake/Output Summary (Last 24 hours) at 08/16/2019 0750 Last data filed at 08/15/2019 2000 Gross per 24 hour  Intake 859.8 ml  Output 800 ml  Net 59.8 ml   Last 3 Weights 08/16/2019 08/15/2019 08/13/2019  Weight (lbs) 162 lb 12.8 oz 163 lb 14.4 oz 164 lb 8 oz  Weight (kg) 73.846 kg 74.345 kg 74.617 kg      Telemetry    NSR at 70-80s- Personally Reviewed  ECG    No new tracing   Physical Exam   GEN: No acute distress.   Neck: No JVD Cardiac: RRR, no  murmurs, rubs, or gallops.  Respiratory: Clear to auscultation bilaterally. GI: Soft, nontender, non-distended  MS: No edema; No deformity. Neuro:  Nonfocal  Psych: Normal affect   Labs    High Sensitivity Troponin:   Recent Labs  Lab 08/13/19 1535 08/13/19 1709 08/14/19 0933  TROPONINIHS 1,177* 1,303* 520*      Chemistry Recent Labs  Lab 08/09/19 0831 08/09/19 0831 08/12/19 1147 08/13/19 0456 08/16/19 0402  NA 141  --   --  143 139  K 5.0  --   --  4.0 4.5  CL 106  --   --  111 107  CO2 21  --   --  23 23  GLUCOSE 59*  --   --  80 83  BUN 18  --   --  11 24*  CREATININE 0.85   < > 0.88 0.89 0.92  CALCIUM 9.3  --   --  8.8* 8.8*  GFRNONAA 72   < > >60 >60 >60  GFRAA 83   < > >60 >60 >60  ANIONGAP  --   --   --  9 9   < > =  values in this interval not displayed.     Hematology Recent Labs  Lab 08/14/19 0017 08/15/19 0416 08/16/19 0402  WBC 6.5 6.3 5.8  RBC 3.98 4.57 4.26  HGB 8.8* 9.9* 9.6*  HCT 29.3* 34.4* 31.8*  MCV 73.6* 75.3* 74.6*  MCH 22.1* 21.7* 22.5*  MCHC 30.0 28.8* 30.2  RDW 23.1* 23.2* 22.6*  PLT 327 336 314    Radiology    No results found.  Cardiac Studies    CORONARY STENT INTERVENTION  08/12/2019  LEFT HEART CATH AND CORS/GRAFTS ANGIOGRAPHY  Conclusion    Prox LAD-1 lesion is 25% stenosed.  Post intervention, there is a 0% residual stenosis.  Prox LAD-2 lesion is 100% stenosed.  Origin to Mid Graft lesion is 99% stenosed.  Mid RCA to Dist RCA lesion is 75% stenosed.  A stent was successfully placed.  A stent was successfully placed.    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  Ostial LAD stent with focal ISR at the ostium to proximal diagonal region.  Stenosis is greater than 90%.  The LAD is totally occluded beyond the origin of the first diagonal.  Circumflex is large and has irregularities with up to 50 to 60% mid and 30% distal distal native disease.   The proximal stent is widely patent and may extend slightly into the distal left main.  The right coronary is dominant and has diffuse heavily calcified disease throughout the mid vessel and up to 75% stenosis.    LV function is normal.  LVEDP is normal.  Successful but technically difficult angioplasty followed by re-stenting of the ostial diagonal.  A 3.0 x 8 Onyx was overlapped with the proximal one fourth of the previously placed stent and deployed at 14 atm x 2 with TIMI grade III flow.  Recommendations:   If angina continues, consider orbital atherectomy of the mid segment of the right coronary.  Will need to be femoral approach.  Aggressive risk factor modification.  Continue aspirin and Plavix.  Discharge in a.m. if stable.    Diagnostic Dominance: Right  Intervention     Patient Profile     64 y.o. female with hx of CAD s/p CABG, HTN, HLD and DM presented for CP and found to have elevated troponin.   Assessment & Plan    1. NSTEMI - Hs-troponin 1177. Underwent stenting to LAD and first diagonal with DES on 08/12/2019. She is found to have occlusion of the septal perforator post PCI. Patient continues to chest pain. Case reviewed with interventional team>> Plan for arthrectomy of RCA today via femoral approach.  - Continue ASA, Brillinta, statin and ACE. On heparin.   2. HTN - BP soft.  - Consolidate metoprolol to50 mg BID  - Reduced Ramipril 2.79m qd  3. HLD - 04/27/2019: Cholesterol 162; HDL 89; LDL Cholesterol 63; Triglycerides 48  - Given extensive CAD, change Simvastatin to Lipitor 40  4.hypoglycemia - Recent A1c was 6.2. Few days ago her Blood sugar was 59. Now in 829s Some numbness and tingling on her fingers.  On gabapentin. -Follow-up blood sugar closely. -We will give clear liquid diet prior to cardiac catheterization.  Spoke with Cath Lab.  For questions or updates, please contact [place] consult [email or web address] for contact info  under    Signed,Leanor Kail PA  08/16/2019, 7:50 AM    Patient seen, examined. Available data reviewed. Agree with findings, assessment, and plan as outlined by  Vin Bhagat, PA-C.  The patient is alert, oriented, in no distress.  Lungs are clear, JVP is normal, heart is regular rate and rhythm with a 2/6 systolic murmur at the right sternal border, abdomen is soft and nontender, extremities have no edema.  The patient continues to have mild chest discomfort.  She appears comfortable on my evaluation.  She will undergo repeat cardiac catheterization with planned orbital atherectomy and PCI of the mid right coronary artery.  She seems to be tolerating aspirin and ticagrelor.  Her radial artery feels fairly small and it might be best to consider a transfemoral approach for her case.  Sherren Mocha, M.D. 08/16/2019 10:26 AM

## 2019-08-16 NOTE — Progress Notes (Signed)
Site area: rt groin fa and fv sheaths Site Prior to Removal:  Level 0 Pressure Applied For: 20 minutes Manual:   yes Patient Status During Pull:  stable Post Pull Site:  Level 0 Post Pull Instructions Given:  yes Post Pull Pulses Present: rt dp palpable Dressing Applied:  Gauze and tegaderm Bedrest begins @ 1840 Comments:   

## 2019-08-17 DIAGNOSIS — I21A9 Other myocardial infarction type: Secondary | ICD-10-CM

## 2019-08-17 DIAGNOSIS — D649 Anemia, unspecified: Secondary | ICD-10-CM

## 2019-08-17 LAB — BASIC METABOLIC PANEL
Anion gap: 7 (ref 5–15)
BUN: 15 mg/dL (ref 8–23)
CO2: 22 mmol/L (ref 22–32)
Calcium: 8.6 mg/dL — ABNORMAL LOW (ref 8.9–10.3)
Chloride: 110 mmol/L (ref 98–111)
Creatinine, Ser: 0.88 mg/dL (ref 0.44–1.00)
GFR calc Af Amer: >60 mL/min (ref >60)
GFR calc non Af Amer: >60 mL/min (ref >60)
Glucose, Bld: 101 mg/dL — ABNORMAL HIGH (ref 70–99)
Potassium: 4.7 mmol/L (ref 3.5–5.1)
Sodium: 139 mmol/L (ref 135–145)

## 2019-08-17 LAB — CBC
HCT: 29.6 % — ABNORMAL LOW (ref 36.0–46.0)
Hemoglobin: 8.9 g/dL — ABNORMAL LOW (ref 12.0–15.0)
MCH: 22.3 pg — ABNORMAL LOW (ref 26.0–34.0)
MCHC: 30.1 g/dL (ref 30.0–36.0)
MCV: 74.2 fL — ABNORMAL LOW (ref 80.0–100.0)
Platelets: 292 10*3/uL (ref 150–400)
RBC: 3.99 MIL/uL (ref 3.87–5.11)
RDW: 22.8 % — ABNORMAL HIGH (ref 11.5–15.5)
WBC: 7 10*3/uL (ref 4.0–10.5)
nRBC: 0 % (ref 0.0–0.2)

## 2019-08-17 LAB — HEPATIC FUNCTION PANEL
ALT: 26 U/L (ref 0–44)
AST: 52 U/L — ABNORMAL HIGH (ref 15–41)
Albumin: 3.2 g/dL — ABNORMAL LOW (ref 3.5–5.0)
Alkaline Phosphatase: 91 U/L (ref 38–126)
Bilirubin, Direct: <0.1 mg/dL (ref 0.0–0.2)
Total Bilirubin: 0.5 mg/dL (ref 0.3–1.2)
Total Protein: 6.7 g/dL (ref 6.5–8.1)

## 2019-08-17 LAB — GLUCOSE, CAPILLARY: Glucose-Capillary: 99 mg/dL (ref 70–99)

## 2019-08-17 MED ORDER — TICAGRELOR 90 MG PO TABS: 90.0000 mg | ORAL_TABLET | Freq: Two times a day (BID) | ORAL | 11 refills | Status: DC

## 2019-08-17 MED ORDER — NITROGLYCERIN 0.4 MG SL SUBL: 0.4000 mg | SUBLINGUAL_TABLET | SUBLINGUAL | 3 refills | Status: DC | PRN

## 2019-08-17 MED ORDER — METOPROLOL TARTRATE 50 MG PO TABS: 50.0000 mg | ORAL_TABLET | Freq: Two times a day (BID) | ORAL | 6 refills | Status: DC

## 2019-08-17 MED ORDER — ATORVASTATIN CALCIUM 40 MG PO TABS: 40.0000 mg | ORAL_TABLET | Freq: Every day | ORAL | 6 refills | Status: AC

## 2019-08-17 MED FILL — Nitroglycerin IV Soln 100 MCG/ML in D5W: INTRA_ARTERIAL | Qty: 10 | Status: AC

## 2019-08-17 NOTE — Progress Notes (Signed)
CARDIAC REHAB PHASE I   PRE:  Rate/Rhythm: 93 SR  BP:  Supine:   Sitting: 110/91  Standing:    SaO2: 99%RA  MODE:  Ambulation: 420 ft   POST:  Rate/Rhythm: 106 ST  BP:  Supine:   Sitting: 130/63  Standing:    SaO2: 98%RA [id number] Pt walked 420 ft on RA with hand held asst. Gait fairly steady. Pt stated her legs a little weak. Stopped a couple of times to rest. Pt stated her chest felt much better. Gave MI booklet and discussed restrictions. No questions re ed done in past visits. To recliner with call bell.   Luetta Nutting, RN BSN  08/17/2019 9:48 AM

## 2019-08-17 NOTE — Discharge Summary (Addendum)
Discharge Summary    Patient ID: Tamara Nunez MRN: [id number]; DOB: 24-Jan-1956  Admit date: 08/12/2019 Discharge date: 08/17/2019  Primary Care Provider: Galen Manila, MD  Primary Cardiologist: Pixie Casino, MD  Primary Electrophysiologist:  None   Discharge Diagnoses    Principal Problem:   Progressive angina [facility]) Active Problems:   S/P CABG x 2   HTN (hypertension)   DM type 2 causing vascular disease ([place])   Mixed hyperlipidemia   CAD (coronary artery disease)   Chronic anemia   Type 4a myocardial infarction [facility])   Diagnostic Studies/Procedures    LHC 08/12/19  Prox LAD-1 lesion is 25% stenosed.  Post intervention, there is a 0% residual stenosis.  Prox LAD-2 lesion is 100% stenosed.  Origin to Mid Graft lesion is 99% stenosed.  Mid RCA to Dist RCA lesion is 75% stenosed.  A stent was successfully placed.  A stent was successfully placed.   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  Ostial LAD stent with focal ISR at the ostium to proximal diagonal region. Stenosis is greater than 90%. The LAD is totally occluded beyond the origin of the first diagonal.  Circumflex is large and has irregularities with up to 50 to 60% mid and 30% distal distal native disease. The proximal stent is widely patent and may extend slightly into the distal left main.  The right coronary is dominant and has diffuse heavily calcified disease throughout the mid vessel and up to 75% stenosis.   LV function is normal. LVEDP is normal.  Successful but technically difficult angioplasty followed by re-stenting of the ostial diagonal. A 3.0 x 8 Onyx was overlapped with the proximal one fourth of the previously placed stent and deployed at 14 atm x 2 with TIMI grade III flow.  Recommendations:   If angina continues, consider orbital atherectomy of the mid segment of the right coronary. Will need to be femoral  approach.  Aggressive risk factor modification.  Continue aspirin and Plavix.  Discharge in a.m. if stable.  Coronary Atherectomy 08/16/19   Mid RCA to Dist RCA lesion is 75% stenosed.  Post intervention, there is a 0% residual stenosis.  A drug-eluting stent was successfully placed using a STENT RESOLUTE ONYX 2.5X34.  1. Successful PCI of the mid RCA using orbital atherectomy and DES x 1  Plan: DAPT for at least one year. Anticipate DC tomorrow.        _____________   History of Present Illness     Tamara Nunez is a 64 y.o. female with CAD s/p CABG 2006, DES to [place] 2018, DES to D1 06/2018, fibromyalgia, HTN, HLD, GERD, chronic appearing anemia, DM, lower extremity edema on chronic Lasix presented to the hospital for planned cardiac catheterization. She was recently seen in the office with worsening exertional chest discomfort, SOB and fatigue similar to prior angina.   Hospital Course     She underwent initial cath 08/12/19 with successful but technically difficult angioplasty followed by re-stenting of the ostial diagonal, also noted to have 75% RCA amongst findings above. LVEF was normal. Over the weekend had residual CP requiring IV NTG. F/u troponins trended 1177, 1303, 520 (not drawn on admission so no prior to compare to) indicative of a peri-procedural MI. EKG showed new anteroseptal TWIs. The films were reviewed with Dr. Tamala Julian who thought likely occluded a septal perforator due to plaque shift. She was treated medically initially. Due to  continued chest pain, she went back to lab 08/16/19 for orbital atherectomy/DES of the mid RCA on 08/16/19. She tolerated this procedure well without complication. Dr. Burt Knack has seen and examined the patient today and feels she is stable for discharge. Cath site was stable by time of DC. She indicates she does not work, so work note was not needed. She is also noted to have anemia and encouraged to follow up with her primary care  provider as outpatient for continued monitoring. Addendum: baseline LFTs returned with  AST of 52 - mildly elevated - will continue plan for statin adjustment as outlined below but consider sooner f/u to trend.  Regarding medication changes: - metoprolol increased to 4m BID - Imdur and ramipril were held due to soft BPs - also now chest pain free - consider resumption if needed as OP - Plavix changed to Brilinta - simvastatin changed to atorvastation - If the patient is tolerating statin at time of follow-up appointment, would consider rechecking liver function/lipid panel in 6-8 weeks. - SL NTG was changed to tablet form (previously spray) - advised to avoid NSAIDS - celebrex was on list although she reported she rarely uses this anyway  She did receive her medicines through [facility]  Did the patient have an acute coronary syndrome (MI, NSTEMI, STEMI, etc) this admission?:  Yes                               AHA/ACC Clinical Performance & Quality Measures: 8. Aspirin prescribed? - Yes 9. ADP Receptor Inhibitor (Plavix/Clopidogrel, Brilinta/Ticagrelor or Effient/Prasugrel) prescribed (includes medically managed patients)? - Yes 10. Beta Blocker prescribed? - Yes 11. High Intensity Statin (Lipitor 40-860mor Crestor 20-405mprescribed? - Yes 12. EF assessed during THIS hospitalization? - Yes 13. For EF <40%, was ACEI/ARB prescribed? - Not Applicable (EF >/= 40%22%4. For EF <40%, Aldosterone Antagonist (Spironolactone or Eplerenone) prescribed? - Not Applicable (EF >/= 40%63%5. Cardiac Rehab Phase II ordered (including medically managed patients)? - Yes   _____________  Discharge Vitals Blood pressure (!) 106/47, pulse 70, temperature 98.2 F (36.8 C), temperature source Oral, resp. rate 16, height _0  (1.651 m), weight 73.5 kg, SpO2 100 %.  Filed Weights   08/15/19 0419 08/16/19 0420 08/17/19 0601  Weight: 74.3 kg 73.8 kg 73.5 kg    Labs & Radiologic Studies    CBC Recent  Labs    08/16/19 0402 08/17/19 0510  WBC 5.8 7.0  HGB 9.6* 8.9*  HCT 31.8* 29.6*  MCV 74.6* 74.2*  PLT 314 [id number] Metabolic Panel Recent Labs    08/16/19 0402 08/17/19 0510  NA 139 139  K 4.5 4.7  CL 107 110  CO2 23 22  GLUCOSE 83 101*  BUN 24* 15  CREATININE 0.92 0.88  CALCIUM 8.8* 8.6*   High Sensitivity Troponin:   Recent Labs  Lab 08/13/19 1535 08/13/19 1709 08/14/19 0933  TROPONINIHS 1,177* 1,303* 520*    _____________  CARDIAC CATHETERIZATION  Result Date: 08/16/2019  Mid RCA to Dist RCA lesion is 75% stenosed.  Post intervention, there is a 0% residual stenosis.  A drug-eluting stent was successfully placed using a STENT RESOLUTE ONYX 2.5X34.  1. Successful PCI of the mid RCA using orbital atherectomy and DES x 1 Plan: DAPT for at least one year. Anticipate DC tomorrow.   CARDIAC CATHETERIZATION  Result Date: 08/12/2019  Prox LAD-1 lesion is 25% stenosed.  Post  intervention, there is a 0% residual stenosis.  Prox LAD-2 lesion is 100% stenosed.  Origin to Mid Graft lesion is 99% stenosed.  Mid RCA to Dist RCA lesion is 75% stenosed.  A stent was successfully placed.  A stent was successfully placed.   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  Ostial LAD stent with focal ISR at the ostium to proximal diagonal region.  Stenosis is greater than 90%.  The LAD is totally occluded beyond the origin of the first diagonal.  Circumflex is large and has irregularities with up to 50 to 60% mid and 30% distal distal native disease.  The proximal stent is widely patent and may extend slightly into the distal left main.  The right coronary is dominant and has diffuse heavily calcified disease throughout the mid vessel and up to 75% stenosis.   LV function is normal.  LVEDP is normal.  Successful but technically difficult angioplasty followed by re-stenting of the ostial diagonal.  A 3.0 x 8 Onyx was  overlapped with the proximal one fourth of the previously placed stent and deployed at 14 atm x 2 with TIMI grade III flow. Recommendations:  If angina continues, consider orbital atherectomy of the mid segment of the right coronary.  Will need to be femoral approach.  Aggressive risk factor modification.  Continue aspirin and Plavix.  Discharge in a.m. if stable.   Disposition   Pt is being discharged home today in good condition.  Follow-up Plans & Appointments    Follow-up Information    Almyra Deforest, [place] Follow up.   Specialties: Cardiology, Radiology Why: [facility] - keep your follow-up appointment scheduled with Almyra Deforest PA-C on Friday August 26, 2019 at 8:15 AM (Arrive by 8:00 AM). Contact information: [address] [place] [address] [phone]        Galen Manila, MD Follow up in 1 week(s).   Specialty: Internal Medicine Why: Your labwork showed you are anemic so please follow up with your primary care provider for further monitoring. Contact information: [address] [place] [address] [phone]          Discharge Instructions    Amb Referral to Cardiac Rehabilitation   Complete by: As directed    Referring to [facility] CRP 2   Diagnosis: Coronary Stents   After initial evaluation and assessments completed: Virtual Based Care may be provided alone or in conjunction with Phase 2 Cardiac Rehab based on patient barriers.: Yes   Diet - low sodium heart healthy   Complete by: As directed    Discharge instructions   Complete by: As directed    Your metoprolol was increased to 30m twice daily - new prescription.  Your simvastatin was changed to a different cholesterol medicine called atorvastatin.  Your clopidogrel/Plavix was changed to a different blood thinner called ticagrelor/Brilinta. Remember that your new blood thinner is twice a day.  Norco/Vicodin (hydrocodone-acetaminophen) has been removed from your medicine  list since you indicated you were no longer taking this.  Since you had stenting this admission and your blood pressure was intermittently low while in the hospital, we have stopped your isosorbide/Imdur and ramipril. There is potential that we might restart these down the road so do not throw them away for now.  Your as-needed nitroglycerin was changed to the tablet form.  Patients taking blood thinners should generally stay away from medicines like Celebrex, celecoxib, ibuprofen, Advil, Motrin, naproxen,  and Aleve due to risk of stomach bleeding. You may take Tylenol as directed or talk to your primary doctor about alternatives.   Increase activity slowly   Complete by: As directed    No driving for 1 week. No lifting over 10 lbs for 2 weeks. No sexual activity for 2 weeks. Keep procedure site clean & dry. If you notice increased pain, swelling, bleeding or pus, call/return!  You may shower, but no soaking baths/hot tubs/pools for 1 week.      Discharge Medications   Allergies as of 08/17/2019      Reactions   Codeine Hives, Shortness Of Breath      Other Anaphylaxis   Nuts   Ciprofloxacin Itching   Valsartan Rash   Adhesive [tape] Itching   "clear tape" used after surgery."took all her skin off".  Paper take is ok   Sulfa Antibiotics Nausea Only      Medication List    STOP taking these medications   celecoxib 200 MG capsule Commonly known as: CELEBREX   clopidogrel 75 MG tablet Commonly known as: PLAVIX   HYDROcodone-acetaminophen 5-325 MG tablet Commonly known as: NORCO/VICODIN   isosorbide mononitrate 60 MG 24 hr tablet Commonly known as: IMDUR   nitroGLYCERIN 0.4 MG/SPRAY spray Commonly known as: NITROLINGUAL Replaced by: nitroGLYCERIN 0.4 MG SL tablet   ramipril 5 MG capsule Commonly known as: ALTACE   simvastatin 20 MG tablet Commonly known as: ZOCOR     TAKE these medications   acetaminophen 500 MG tablet Commonly known as: TYLENOL Take 1,000 mg by  mouth every 6 (six) hours as needed for moderate pain or headache.   albuterol 108 (90 Base) MCG/ACT inhaler Commonly known as: VENTOLIN HFA Inhale 2 puffs into the lungs every 6 (six) hours as needed for wheezing.   aspirin EC 81 MG tablet Take 81 mg by mouth daily.   atorvastatin 40 MG tablet Commonly known as: LIPITOR Take 1 tablet (40 mg total) by mouth daily.   cetirizine 10 MG tablet Commonly known as: ZYRTEC Take 10 mg by mouth daily.   CO-ENZYME Q-10 PO Take 200 mg by mouth daily.   diclofenac sodium 1 % Gel Commonly known as: VOLTAREN Apply 2 g topically 4 (four) times daily as needed (pain).   Fingerstix Lancets Misc Inject into the skin.   fluticasone 50 MCG/ACT nasal spray Commonly known as: FLONASE Place 1 spray into both nostrils daily.   furosemide 40 MG tablet Commonly known as: LASIX Take 1 tablet (40 mg total) by mouth daily.   gabapentin 600 MG tablet Commonly known as: NEURONTIN Take 600 mg by mouth 3 (three) times daily.   lidocaine 5 % Commonly known as: LIDODERM Place 2-3 patches onto the skin daily as needed (pain). Remove & Discard patch within 12 hours or as directed by MD   methocarbamol 500 MG tablet Commonly known as: ROBAXIN Take 1 tablet (500 mg total) by mouth every 8 (eight) hours as needed for muscle spasms.   metoprolol tartrate 50 MG tablet Commonly known as: LOPRESSOR Take 1 tablet (50 mg total) by mouth 2 (two) times daily. What changed:   medication strength  how much to take   nitroGLYCERIN 0.4 MG SL tablet Commonly known as: NITROSTAT Place 1 tablet (0.4 mg total) under the tongue every 5 (five) minutes as needed for chest pain (up to 3 doses). Replaces: nitroGLYCERIN 0.4 MG/SPRAY spray   OneTouch Ultra test strip Generic drug: glucose blood Test BG qid  pantoprazole 40 MG tablet Commonly known as: PROTONIX Take 40 mg by mouth daily as needed (acid reflux).   polyethylene glycol powder 17 GM/SCOOP  powder Commonly known as: GLYCOLAX/MIRALAX Take 17 g by mouth daily.   ranolazine 1000 MG SR tablet Commonly known as: Ranexa Take 1 tablet (1,000 mg total) by mouth 2 (two) times daily.   ticagrelor 90 MG Tabs tablet Commonly known as: BRILINTA Take 1 tablet (90 mg total) by mouth 2 (two) times daily.   vitamin B-12 1000 MCG tablet Commonly known as: CYANOCOBALAMIN Take 1,000 mcg by mouth daily.   Vitamin D (Ergocalciferol) 1.25 MG (50000 UNIT) Caps capsule Commonly known as: DRISDOL Take 1 capsule (50,000 Units total) by mouth every 7 (seven) days.   vitamin E 180 MG (400 UNITS) capsule Take 400 Units by mouth daily.   Vitron-C 65-125 MG Tabs Generic drug: Iron-Vitamin C Take 1 tablet by mouth 2 (two) times daily.          Outstanding Labs/Studies   If the patient is tolerating statin at time of follow-up appointment, would consider rechecking liver function/lipid panel in 6-8 weeks.  Duration of Discharge Encounter   Greater than 30 minutes including physician time.  Signed, Charlie Pitter, PA-C 08/17/2019, 11:10 AM

## 2019-08-17 NOTE — Progress Notes (Addendum)
Progress Note  Patient Name: Tamara Nunez Date of Encounter: 08/17/2019  Primary Cardiologist: Pixie Casino, MD  Subjective   Feeling great, wants to go home. No further CP or SOB. SBP soft this AM at 113/52 but f/u SBP while I was in the room 119 systolic.  Inpatient Medications    Scheduled Meds: . aspirin EC  81 mg Oral Daily  . atorvastatin  40 mg Oral Daily  . furosemide  40 mg Oral Daily  . gabapentin  600 mg Oral TID  . heparin  5,000 Units Subcutaneous Q8H  . loratadine  10 mg Oral Daily  . metoprolol tartrate  50 mg Oral BID  . polyethylene glycol  17 g Oral Daily  . ramipril  2.5 mg Oral QHS  . ranolazine  1,000 mg Oral BID  . sodium chloride flush  3 mL Intravenous Q12H  . sodium chloride flush  3 mL Intravenous Q12H  . sodium chloride flush  3 mL Intravenous Q12H  . ticagrelor  90 mg Oral BID   Continuous Infusions: . sodium chloride 20 mL/hr at 08/15/19 0700  . sodium chloride     PRN Meds: sodium chloride, sodium chloride, acetaminophen, albuterol, docusate sodium, morphine injection, nitroGLYCERIN, ondansetron (ZOFRAN) IV, pantoprazole, sodium chloride flush, sodium chloride flush   Vital Signs    Vitals:   08/16/19 1929 08/16/19 1941 08/16/19 2345 08/17/19 0601  BP: (!) 120/54 (!) 115/50 103/62 (!) 113/52  Pulse: 82 89 69 84  Resp: 18 17 15 17   Temp: 98.1 F (36.7 C) 98 F (36.7 C) 97.6 F (36.4 C) 98.1 F (36.7 C)  TempSrc: Oral Oral Oral Oral  SpO2: 100% 99% 100% 100%  Weight:    73.5 kg  Height:        Intake/Output Summary (Last 24 hours) at 08/17/2019 0933 Last data filed at 08/17/2019 0615 Gross per 24 hour  Intake 1190.76 ml  Output 950 ml  Net 240.76 ml   Last 3 Weights 08/17/2019 08/16/2019 08/15/2019  Weight (lbs) 162 lb 1.6 oz 162 lb 12.8 oz 163 lb 14.4 oz  Weight (kg) 73.528 kg 73.846 kg 74.345 kg     Telemetry    NSR - Personally Reviewed  ECG    NSR 79bpm, RBBB, QTc 529m suspect r/t BBB - Personally  Reviewed  Physical Exam   GEN: No acute distress.  HEENT: Normocephalic, atraumatic, sclera non-icteric. Neck: No JVD or bruits. Cardiac: RRR no murmurs, rubs, or gallops.  Radials/DP/PT 1+ and equal bilaterally.  Respiratory: Clear to auscultation bilaterally. Breathing is unlabored. GI: Soft, nontender, non-distended, BS +x 4. MS: no deformity. Extremities: No clubbing or cyanosis. No edema. Distal pedal pulses are 2+ and equal bilaterally. Right groin cath site without hematoma, ecchymosis, or bruit - very minimal fresh blood on gauze. Neuro:  AAOx3. Follows commands. Psych:  Responds to questions appropriately with a normal affect.  Labs    High Sensitivity Troponin:   Recent Labs  Lab 08/13/19 1535 08/13/19 1709 08/14/19 0933  TROPONINIHS 1,177* 1,303* 520*      Cardiac EnzymesNo results for input(s): TROPONINI in the last 168 hours. No results for input(s): TROPIPOC in the last 168 hours.   Chemistry Recent Labs  Lab 08/13/19 0456 08/16/19 0402 08/17/19 0510  NA 143 139 139  K 4.0 4.5 4.7  CL 111 107 110  CO2 23 23 22   GLUCOSE 80 83 101*  BUN 11 24* 15  CREATININE 0.89 0.92 0.88  CALCIUM 8.8* 8.8*  8.6*  GFRNONAA >60 >60 >60  GFRAA >60 >60 >60  ANIONGAP 9 9 7      Hematology Recent Labs  Lab 08/15/19 0416 08/16/19 0402 08/17/19 0510  WBC 6.3 5.8 7.0  RBC 4.57 4.26 3.99  HGB 9.9* 9.6* 8.9*  HCT 34.4* 31.8* 29.6*  MCV 75.3* 74.6* 74.2*  MCH 21.7* 22.5* 22.3*  MCHC 28.8* 30.2 30.1  RDW 23.2* 22.6* 22.8*  PLT 336 314 292    BNPNo results for input(s): BNP, PROBNP in the last 168 hours.   DDimer No results for input(s): DDIMER in the last 168 hours.   Radiology    CARDIAC CATHETERIZATION  Result Date: 08/16/2019  Mid RCA to Dist RCA lesion is 75% stenosed.  Post intervention, there is a 0% residual stenosis.  A drug-eluting stent was successfully placed using a STENT RESOLUTE ONYX 2.5X34.  1. Successful PCI of the mid RCA using orbital  atherectomy and DES x 1 Plan: DAPT for at least one year. Anticipate DC tomorrow.    Cardiac Studies   LHC 08/12/19  Prox LAD-1 lesion is 25% stenosed.  Post intervention, there is a 0% residual stenosis.  Prox LAD-2 lesion is 100% stenosed.  Origin to Mid Graft lesion is 99% stenosed.  Mid RCA to Dist RCA lesion is 75% stenosed.  A stent was successfully placed.  A stent was successfully placed.    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  Ostial LAD stent with focal ISR at the ostium to proximal diagonal region.  Stenosis is greater than 90%.  The LAD is totally occluded beyond the origin of the first diagonal.  Circumflex is large and has irregularities with up to 50 to 60% mid and 30% distal distal native disease.  The proximal stent is widely patent and may extend slightly into the distal left main.  The right coronary is dominant and has diffuse heavily calcified disease throughout the mid vessel and up to 75% stenosis.    LV function is normal.  LVEDP is normal.  Successful but technically difficult angioplasty followed by re-stenting of the ostial diagonal.  A 3.0 x 8 Onyx was overlapped with the proximal one fourth of the previously placed stent and deployed at 14 atm x 2 with TIMI grade III flow.  Recommendations:   If angina continues, consider orbital atherectomy of the mid segment of the right coronary.  Will need to be femoral approach.  Aggressive risk factor modification.  Continue aspirin and Plavix.  Discharge in a.m. if stable.  Coronary Atherectomy 08/16/19   Mid RCA to Dist RCA lesion is 75% stenosed.  Post intervention, there is a 0% residual stenosis.  A drug-eluting stent was successfully placed using a STENT RESOLUTE ONYX 2.5X34.   1. Successful PCI of the mid RCA using orbital atherectomy and DES x 1  Plan: DAPT for at least one year. Anticipate DC tomorrow.     Patient  Profile     64 y.o. female with CAD s/p CABG 2006, DES to [place] 2018, DES to D1 06/2018, fibromyalgia, HTN, HLD, GERD, chronic appearing anemia, DM, lower extremity edema on chronic Lasix presented to the hospital for planned cardiac catheterization due to worsening exertional chest discomfort, SOB and fatigue similar to prior angina. Underwent cath 08/12/19 with successful but technically difficult angioplasty followed by re-stenting of the ostial diagonal, also noted to have 75% RCA amongst findings above. Over the weekend had residual CP -  troponins trended 1177, 1303, 520. Went back to lab 08/16/19 for orbital atherectomy 08/16/19.  Assessment & Plan    1. CAD with recent progressive angina - s/p PCI as above. Now on ASA + Brilinta, higher dose of metoprolol, titrated statin, Ranexa. Imdur held on admission (83m BID) while on NTG drip. Will discuss d/c med recs with MD.  2. Essential HTN - BP has been soft at times, requiring down-titration of ACEI. Metoprolol has been titrated this admission. Nitrates have not yet been resumed. Will hold ramipril completely due to the emphasis towards antianginal regimen first and anticipate discussing discharge recs with Dr. CBurt Knack  3. Hyperlipidemia - simvastatin has been changed to atorvastatin this admission. Add baseline LFTs to labs. If the patient is tolerating statin at time of follow-up appointment, would consider rechecking liver function/lipid panel in 6-8 weeks.  4. Chronic appearing anemia, microcytic - needs OP f/u of this, Hgb has been 8-10 range in last 1-2 years. On Vitron-C as OP, anticipate resuming.  Has post-cath f/u on 6/18 with Hao.  For questions or updates, please contact [place] consult [email or web address] for contact info under Cardiology/STEMI.  Signed, DCharlie Pitter PA-C 08/17/2019, 9:33 AM    Patient seen, examined. Available data reviewed. Agree with findings, assessment, and plan as outlined by DMelina Copa PA-C.  The  patient is independently interviewed and examined.  Lungs are clear, heart is regular rate and rhythm with no murmur gallop, abdomen is soft and nontender, the right groin site is clear with a dry dressing in place, lower extremities have no edema.  Agree with discharging her on metoprolol and holding ramipril and isosorbide since her blood pressure is soft.  These can be reinitiated as an outpatient once her blood pressure is back to baseline.  Follow-up plan as outlined above currently scheduled on June 18.  Post PCI instructions given to the patient.  She will ease back into activity slowly and understands to avoid any strenuous activity over the next 4 to 5 days.  MSherren Mocha M.D. 08/17/2019 10:37 AM

## 2019-08-17 NOTE — Discharge Instructions (Signed)
Femoral Site Care This sheet gives you information about how to care for yourself after your procedure. Your health care provider may also give you more specific instructions. If you have problems or questions, contact your health care provider. What can I expect after the procedure? After the procedure, it is common to have:  Bruising that usually fades within 1-2 weeks.  Tenderness at the site. Follow these instructions at home: Wound care  Follow instructions from your health care provider about how to take care of your insertion site. Make sure you: ? Wash your hands with soap and water before you change your bandage (dressing). If soap and water are not available, use hand sanitizer. ? Change your dressing as told by your health care provider. ? Leave stitches (sutures), skin glue, or adhesive strips in place. These skin closures may need to stay in place for 2 weeks or longer. If adhesive strip edges start to loosen and curl up, you may trim the loose edges. Do not remove adhesive strips completely unless your health care provider tells you to do that.  Do not take baths, swim, or use a hot tub until your health care provider approves.  You may shower 24-48 hours after the procedure or as told by your health care provider. ? Gently wash the site with plain soap and water. ? Pat the area dry with a clean towel. ? Do not rub the site. This may cause bleeding.  Do not apply powder or lotion to the site. Keep the site clean and dry.  Check your femoral site every day for signs of infection. Check for: ? Redness, swelling, or pain. ? Fluid or blood. ? Warmth. ? Pus or a bad smell. Activity  For the first 2-3 days after your procedure, or as long as directed: ? Avoid climbing stairs as much as possible. ? Do not squat.  Do not lift anything that is heavier than 10 lb (4.5 kg), or the limit that you are told, until your health care provider says that it is safe.  Rest as  directed. ? Avoid sitting for a long time without moving. Get up to take short walks every 1-2 hours.  Do not drive for 24 hours if you were given a medicine to help you relax (sedative). General instructions  Take over-the-counter and prescription medicines only as told by your health care provider.  Keep all follow-up visits as told by your health care provider. This is important. Contact a health care provider if you have:  A fever or chills.  You have redness, swelling, or pain around your insertion site. Get help right away if:  The catheter insertion area swells very fast.  You pass out.  You suddenly start to sweat or your skin gets clammy.  The catheter insertion area is bleeding, and the bleeding does not stop when you hold steady pressure on the area.  The area near or just beyond the catheter insertion site becomes pale, cool, tingly, or numb. These symptoms may represent a serious problem that is an emergency. Do not wait to see if the symptoms will go away. Get medical help right away. Call your local emergency services (911 in the [place] Do not drive yourself to the hospital. Summary  After the procedure, it is common to have bruising that usually fades within 1-2 weeks.  Check your femoral site every day for signs of infection.  Do not lift anything that is heavier than 10 lb (4.5 kg), or the   limit that you are told, until your health care provider says that it is safe. This information is not intended to replace advice given to you by your health care provider. Make sure you discuss any questions you have with your health care provider. Document Revised: 03/09/2017 Document Reviewed: 03/09/2017 Elsevier Patient Education  2020 [employer]    Heart-Healthy Eating Plan Heart-healthy meal planning includes:  Eating less unhealthy fats.  Eating more healthy fats.  Making other changes in your diet. Talk with your doctor or a diet specialist (dietitian) to  create an eating plan that is right for you. What is my plan? Your doctor may recommend an eating plan that includes:  Total fat: ______% or less of total calories a day.  Saturated fat: ______% or less of total calories a day.  Cholesterol: less than _________mg a day. What are tips for following this plan? Cooking Avoid frying your food. Try to bake, boil, grill, or broil it instead. You can also reduce fat by:  Removing the skin from poultry.  Removing all visible fats from meats.  Steaming vegetables in water or broth. Meal planning   At meals, divide your plate into four equal parts: ? Fill one-half of your plate with vegetables and green salads. ? Fill one-fourth of your plate with whole grains. ? Fill one-fourth of your plate with lean protein foods.  Eat 4-5 servings of vegetables per day. A serving of vegetables is: ? 1 cup of raw or cooked vegetables. ? 2 cups of raw leafy greens.  Eat 4-5 servings of fruit per day. A serving of fruit is: ? 1 medium whole fruit. ?  cup of dried fruit. ?  cup of fresh, frozen, or canned fruit. ?  cup of 100% fruit juice.  Eat more foods that have soluble fiber. These are apples, broccoli, carrots, beans, peas, and barley. Try to get 20-30 g of fiber per day.  Eat 4-5 servings of nuts, legumes, and seeds per week: ? 1 serving of dried beans or legumes equals  cup after being cooked. ? 1 serving of nuts is  cup. ? 1 serving of seeds equals 1 tablespoon. General information  Eat more home-cooked food. Eat less restaurant, buffet, and fast food.  Limit or avoid alcohol.  Limit foods that are high in starch and sugar.  Avoid fried foods.  Lose weight if you are overweight.  Keep track of how much salt (sodium) you eat. This is important if you have high blood pressure. Ask your doctor to tell you more about this.  Try to add vegetarian meals each week. Fats  Choose healthy fats. These include olive oil and canola  oil, flaxseeds, walnuts, almonds, and seeds.  Eat more omega-3 fats. These include salmon, mackerel, sardines, tuna, flaxseed oil, and ground flaxseeds. Try to eat fish at least 2 times each week.  Check food labels. Avoid foods with trans fats or high amounts of saturated fat.  Limit saturated fats. ? These are often found in animal products, such as meats, butter, and cream. ? These are also found in plant foods, such as palm oil, palm kernel oil, and coconut oil.  Avoid foods with partially hydrogenated oils in them. These have trans fats. Examples are stick margarine, some tub margarines, cookies, crackers, and other baked goods. What foods can I eat? Fruits All fresh, canned (in natural juice), or frozen fruits. Vegetables Fresh or frozen vegetables (raw, steamed, roasted, or grilled). Green salads. Grains Most grains. Choose whole   wheat and whole grains most of the time. Rice and pasta, including brown rice and pastas made with whole wheat. Meats and other proteins Lean, well-trimmed beef, veal, pork, and lamb. Chicken and turkey without skin. All fish and shellfish. Wild duck, rabbit, pheasant, and venison. Egg whites or low-cholesterol egg substitutes. Dried beans, peas, lentils, and tofu. Seeds and most nuts. Dairy Low-fat or nonfat cheeses, including ricotta and mozzarella. Skim or 1% milk that is liquid, powdered, or evaporated. Buttermilk that is made with low-fat milk. Nonfat or low-fat yogurt. Fats and oils Non-hydrogenated (trans-free) margarines. Vegetable oils, including soybean, sesame, sunflower, olive, peanut, safflower, corn, canola, and cottonseed. Salad dressings or mayonnaise made with a vegetable oil. Beverages Mineral water. Coffee and tea. Diet carbonated beverages. Sweets and desserts Sherbet, gelatin, and fruit ice. Small amounts of dark chocolate. Limit all sweets and desserts. Seasonings and condiments All seasonings and condiments. The items listed above  may not be a complete list of foods and drinks you can eat. Contact a dietitian for more options. What foods should I avoid? Fruits Canned fruit in heavy syrup. Fruit in cream or butter sauce. Fried fruit. Limit coconut. Vegetables Vegetables cooked in cheese, cream, or butter sauce. Fried vegetables. Grains Breads that are made with saturated or trans fats, oils, or whole milk. Croissants. Sweet rolls. Donuts. High-fat crackers, such as cheese crackers. Meats and other proteins Fatty meats, such as hot dogs, ribs, sausage, bacon, rib-eye roast or steak. High-fat deli meats, such as salami and bologna. Caviar. Domestic duck and goose. Organ meats, such as liver. Dairy Cream, sour cream, cream cheese, and creamed cottage cheese. Whole-milk cheeses. Whole or 2% milk that is liquid, evaporated, or condensed. Whole buttermilk. Cream sauce or high-fat cheese sauce. Yogurt that is made from whole milk. Fats and oils Meat fat, or shortening. Cocoa butter, hydrogenated oils, palm oil, coconut oil, palm kernel oil. Solid fats and shortenings, including bacon fat, salt pork, lard, and butter. Nondairy cream substitutes. Salad dressings with cheese or sour cream. Beverages Regular sodas and juice drinks with added sugar. Sweets and desserts Frosting. Pudding. Cookies. Cakes. Pies. Milk chocolate or white chocolate. Buttered syrups. Full-fat ice cream or ice cream drinks. The items listed above may not be a complete list of foods and drinks to avoid. Contact a dietitian for more information. Summary  Heart-healthy meal planning includes eating less unhealthy fats, eating more healthy fats, and making other changes in your diet.  Eat a balanced diet. This includes fruits and vegetables, low-fat or nonfat dairy, lean protein, nuts and legumes, whole grains, and heart-healthy oils and fats. This information is not intended to replace advice given to you by your health care provider. Make sure you discuss  any questions you have with your health care provider. Document Revised: 04/30/2017 Document Reviewed: 04/03/2017 Elsevier Patient Education  2020 [employer]  

## 2019-08-17 NOTE — Care Management (Signed)
1108 08-17-19 Case Manager provided patient with [employer] discount card. No further needs from Case Manager at this time. Gala Lewandowsky, RN,BSN Case Manager

## 2019-08-18 ENCOUNTER — Telehealth (HOSPITAL_COMMUNITY): Payer: Self-pay

## 2019-08-18 NOTE — Telephone Encounter (Signed)
Faxed referral to [facility] for Cardiac Rehab.  

## 2019-08-26 ENCOUNTER — Ambulatory Visit (INDEPENDENT_AMBULATORY_CARE_PROVIDER_SITE_OTHER): Payer: Commercial Managed Care - PPO | Admitting: Physician Assistant

## 2019-08-26 ENCOUNTER — Other Ambulatory Visit: Payer: Self-pay

## 2019-08-26 ENCOUNTER — Encounter: Payer: Self-pay | Admitting: Physician Assistant

## 2019-08-26 VITALS — BP 128/62 | HR 57 | Ht 64.0 in | Wt 160.0 lb

## 2019-08-26 DIAGNOSIS — E119 Type 2 diabetes mellitus without complications: Secondary | ICD-10-CM

## 2019-08-26 DIAGNOSIS — Z79899 Other long term (current) drug therapy: Secondary | ICD-10-CM

## 2019-08-26 DIAGNOSIS — E785 Hyperlipidemia, unspecified: Secondary | ICD-10-CM | POA: Diagnosis not present

## 2019-08-26 DIAGNOSIS — I25708 Atherosclerosis of coronary artery bypass graft(s), unspecified, with other forms of angina pectoris: Secondary | ICD-10-CM

## 2019-08-26 DIAGNOSIS — I10 Essential (primary) hypertension: Secondary | ICD-10-CM | POA: Diagnosis not present

## 2019-08-26 DIAGNOSIS — I200 Unstable angina: Secondary | ICD-10-CM

## 2019-08-26 DIAGNOSIS — D649 Anemia, unspecified: Secondary | ICD-10-CM

## 2019-08-26 MED ORDER — AMLODIPINE BESYLATE 2.5 MG PO TABS: 2.5000 mg | ORAL_TABLET | Freq: Every day | ORAL | 3 refills | Status: AC

## 2019-08-26 NOTE — Patient Instructions (Addendum)
Medication Instructions:  Start Amlodipine  2.5mg  Daily *If you need a refill on your cardiac medications before your next appointment, please call your pharmacy*   Lab Work: CBC, Lipid Panel If you have labs (blood work) drawn today and your tests are completely normal, you will receive your results only by: Marland Kitchen MyChart Message (if you have MyChart) OR . A paper copy in the mail If you have any lab test that is abnormal or we need to change your treatment, we will call you to review the results.   Testing/Procedures: None   Follow-Up: At [facility], you and your health needs are our priority.  As part of our continuing mission to provide you with exceptional heart care, we have created designated Provider Care Teams.  These Care Teams include your primary Cardiologist (physician) and Advanced Practice Providers (APPs -  Physician Assistants and Nurse Practitioners) who all work together to provide you with the care you need, when you need it.  We recommend signing up for the patient portal called "MyChart".  Sign up information is provided on this After Visit Summary.  MyChart is used to connect with patients for Virtual Visits (Telemedicine).  Patients are able to view lab/test results, encounter notes, upcoming appointments, etc.  Non-urgent messages can be sent to your provider as well.   To learn more about what you can do with MyChart, go to [email or web address].    Your next appointment:   1 month(s)  The format for your next appointment:   In Person  Provider:   Azalee Course PA-C   Other Instructions Schedule Appointment with Dr. Ellene Route in 3 months

## 2019-08-26 NOTE — Progress Notes (Signed)
Cardiology Office Note:    Date:  08/28/2019   ID:  Tamara Nunez, DOB Oct 25, 1955, MRN [id number]  PCP:  Galen Manila, MD  [place] Cardiologist:  Pixie Casino, MD  [place] Electrophysiologist:  None   Referring MD: Galen Manila, MD   Chief Complaint  Patient presents with  . Hospitalization Follow-up    seen for Dr. Debara Pickett    History of Present Illness:    Tamara Nunez is a 64 y.o. female with a hx of HTN, HLD, DM 2, fibromyalgia andCAD s/p CABG 2006 with LIMA-LAD, SVG-distal LAD. Repeat cardiac catheterization in 2009 showed widely patent grafts. She has significant headache on Imdur. She underwent cardiac catheterization by Dr. Ellyn Hack in July 2018 demonstrating 80% proximal RCA lesion treated with 2.5 x 8 mm DES, she was also found to have atretic LIMA to LAD with100% occlusion to mid LAD, SVG to distal LAD was normal. Due to worsening fatigue and exertional symptoms, she underwent another cardiac catheterization on 06/25/2018, this showed 100% proximal to mid LAD occlusion, 70% ostial OM 2, patent left circumflex stent, 90% ostial D1 lesion treated with a 2.5 x 16 mm DES, EF 55 to 65%.   I last saw the patient on 08/03/2019 at which time she complained of recurrent exertional shortness of breath and the chest discomfort.  After discussing with Dr. Gwenlyn Found, we recommend a repeat cardiac catheterization.  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  Patient underwent successful drug-eluting placement to the diagonal lesion overlapping with the previously placed stent.  Postprocedure, she did have troponin elevation, cath film was reviewed and it was believed she had a occluded septal perforator due to plaque shift.  Due to recurrent  angina, she eventually underwent stenting of mid RCA lesion on 08/16/2019.  Her previous Plavix has been switched to Brilinta.  Nitrate was discontinued and she was switched from simvastatin to atorvastatin during this admission.  Patient presents today for cardiology office visit.  She still has occasional chest discomfort requiring nitroglycerin.  This does not seems to occur consistently with the degree of exertion at this point.  I recommended a low-dose amlodipine 2.5 mg daily.  EKG today showed sinus rhythm with right bundle branch block, otherwise no significant ST-T wave changes.  She is frustrated that cardiac rehab center in [place] still has not called her yet.  Otherwise, we discussed the recent work-up and the need to be compliant with aspirin and Brilinta.  She has not noticed any major bleeding issue.  She has follow-up with her PCP next month who will obtain additional lab work.  We recommend a CBC and fasting lipid panel at the time.  She is taking iron therapy for her anemia.  Given her recurrent chest discomfort, I plan to see her back in 1 month and she can see Dr. Debara Pickett in 2 to 3 months.   Past Medical History:  Diagnosis Date  . Arthritis    "in my back" (05/06/2017)  . CAD (coronary artery disease)    CABG 2006, 09/2016 DES RCA, 04/2017 DES Circ, 06/2018 DES 1st diag  . Chronic back pain   . Coronary artery disease    a. s/p CABG in 2006 with LIMA-LAD, SVG-dLAD. b. cath in 2009 showing patent grafts. c. 09/2016:  cath with occluded LAD but patent SVG-dLAD. LIMA was atretic. pRCA with 80% stenosis --> treated with DES  . Fibromyalgia   . GERD (gastroesophageal reflux disease)   . Hyperlipidemia   . Hypertension   . Type II diabetes mellitus ([place])     Past Surgical History:  Procedure Laterality Date  . ABDOMINAL HERNIA REPAIR  2018  . BACK SURGERY    . CARDIAC CATHETERIZATION  01/13/2008  . COLONOSCOPY    . CORONARY ANGIOPLASTY WITH STENT PLACEMENT  09/26/2016   Prox  RCA lesion, 80 %stenosed. DES   . CORONARY ARTERY BYPASS GRAFT  12/23/2004   2 vessel - LIMA to LAD (atretic), SVG to distal LAD  . CORONARY ATHERECTOMY N/A 08/16/2019   Procedure: CORONARY ATHERECTOMY;  Surgeon: Martinique, Peter M, MD;  Location: [facility];  Service: Cardiovascular;  Laterality: N/A;  . CORONARY STENT INTERVENTION N/A 09/26/2016   Procedure: Coronary Stent Intervention;  Surgeon: Leonie Man, MD;  Location: [facility];  Service: Cardiovascular;  Laterality: N/A;  . CORONARY STENT INTERVENTION N/A 05/06/2017   Procedure: CORONARY STENT INTERVENTION;  Surgeon: Burnell Blanks, MD;  Location: [facility];  Service: Cardiovascular;  Laterality: N/A;  . CORONARY STENT INTERVENTION N/A 06/25/2018   Procedure: CORONARY STENT INTERVENTION;  Surgeon: Martinique, Peter M, MD;  Location: [facility];  Service: Cardiovascular;  Laterality: N/A;  . CORONARY STENT INTERVENTION N/A 08/12/2019   Procedure: CORONARY STENT INTERVENTION;  Surgeon: Belva Crome, MD;  Location: [facility];  Service: Cardiovascular;  Laterality: N/A;  . CORONARY STENT INTERVENTION N/A 08/16/2019   Procedure: CORONARY STENT INTERVENTION;  Surgeon: Martinique, Peter M, MD;  Location: [facility];  Service: Cardiovascular;  Laterality: N/A;  . DOPPLER ECHOCARDIOGRAPHY  04/12/2008   EF 60%  . GASTRIC BYPASS  2009  . HERNIA REPAIR    . LEFT HEART CATH AND CORONARY ANGIOGRAPHY N/A 05/06/2017   Procedure: LEFT HEART CATH AND CORONARY ANGIOGRAPHY;  Surgeon: Burnell Blanks, MD;  Location: [facility];  Service: Cardiovascular;  Laterality: N/A;  . LEFT HEART CATH AND CORS/GRAFTS ANGIOGRAPHY N/A 09/26/2016   Procedure: Left Heart Cath and Cors/Grafts Angiography;  Surgeon: Leonie Man, MD;  Location: [facility];  Service: Cardiovascular;  Laterality: N/A;  . LEFT HEART CATH AND CORS/GRAFTS ANGIOGRAPHY N/A 06/25/2018   Procedure: LEFT HEART CATH AND  CORS/GRAFTS ANGIOGRAPHY;  Surgeon: Martinique, Peter M, MD;  Location: [facility];  Service: Cardiovascular;  Laterality: N/A;  . LEFT HEART CATH AND CORS/GRAFTS ANGIOGRAPHY N/A 08/12/2019   Procedure: LEFT HEART CATH AND CORS/GRAFTS ANGIOGRAPHY;  Surgeon: Belva Crome, MD;  Location: [facility];  Service: Cardiovascular;  Laterality: N/A;  . NM MYOVIEW LTD  06/03/2012   EF 71%  . POSTERIOR FUSION LUMBAR SPINE  1990s  . TUBAL LIGATION    . VAGINAL HYSTERECTOMY     "partial"    Current Medications: Current Meds  Medication Sig  . acetaminophen (TYLENOL) 500 MG tablet Take 1,000 mg by mouth every 6 (six) hours as needed for moderate pain or headache.  . albuterol (PROVENTIL HFA;VENTOLIN HFA) 108 (90 BASE) MCG/ACT inhaler Inhale 2 puffs into the lungs every 6 (six) hours as needed for wheezing.  Marland Kitchen aspirin EC 81 MG tablet Take 81 mg by mouth daily.  Marland Kitchen atorvastatin (LIPITOR) 40 MG tablet Take 1 tablet (40 mg total) by mouth daily.  . cetirizine (ZYRTEC) 10  MG tablet Take 10 mg by mouth daily.   Marland Kitchen CO-ENZYME Q-10 PO Take 200 mg by mouth daily.   . diclofenac sodium (VOLTAREN) 1 % GEL Apply 2 g topically 4 (four) times daily as needed (pain).   . Fingerstix Lancets MISC Inject into the skin.  . fluticasone (FLONASE) 50 MCG/ACT nasal spray Place 1 spray into both nostrils daily.   . furosemide (LASIX) 40 MG tablet Take 1 tablet (40 mg total) by mouth daily.  Marland Kitchen gabapentin (NEURONTIN) 600 MG tablet Take 600 mg by mouth 3 (three) times daily.   Marland Kitchen lidocaine (LIDODERM) 5 % Place 2-3 patches onto the skin daily as needed (pain). Remove & Discard patch within 12 hours or as directed by MD   . methocarbamol (ROBAXIN) 500 MG tablet Take 1 tablet (500 mg total) by mouth every 8 (eight) hours as needed for muscle spasms.  . metoprolol tartrate (LOPRESSOR) 50 MG tablet Take 1 tablet (50 mg total) by mouth 2 (two) times daily.  . nitroGLYCERIN (NITROSTAT) 0.4 MG SL tablet Place 1 tablet (0.4 mg  total) under the tongue every 5 (five) minutes as needed for chest pain (up to 3 doses).  Glory Rosebush ULTRA test strip Test BG qid  . pantoprazole (PROTONIX) 40 MG tablet Take 40 mg by mouth daily as needed (acid reflux).   . polyethylene glycol powder (GLYCOLAX/MIRALAX) powder Take 17 g by mouth daily.   . ranolazine (RANEXA) 1000 MG SR tablet Take 1 tablet (1,000 mg total) by mouth 2 (two) times daily.  . ticagrelor (BRILINTA) 90 MG TABS tablet Take 1 tablet (90 mg total) by mouth 2 (two) times daily.  . vitamin B-12 (CYANOCOBALAMIN) 1000 MCG tablet Take 1,000 mcg by mouth daily.  . Vitamin D, Ergocalciferol, (DRISDOL) 1.25 MG (50000 UNIT) CAPS capsule Take 1 capsule (50,000 Units total) by mouth every 7 (seven) days.  . vitamin E 400 UNIT capsule Take 400 Units by mouth daily.  Marland Kitchen VITRON-C 65-125 MG TABS Take 1 tablet by mouth 2 (two) times daily.     Allergies:   Codeine, Other, Ciprofloxacin, Valsartan, Adhesive [tape], and Sulfa antibiotics   Social History   Socioeconomic History  . Marital status: Married    Spouse name: Not on file  . Number of children: Not on file  . Years of education: Not on file  . Highest education level: Not on file  Occupational History  . Not on file  Tobacco Use  . Smoking status: Never Smoker  . Smokeless tobacco: Never Used  Vaping Use  . Vaping Use: Never used  Substance and Sexual Activity  . Alcohol use: No  . Drug use: No  . Sexual activity: Not on file  Other Topics Concern  . Not on file  Social History Narrative  . Not on file   Social Determinants of Health   Financial Resource Strain:   . Difficulty of Paying Living Expenses:   Food Insecurity:   . Worried About Charity fundraiser in the Last Year:   . Arboriculturist in the Last Year:   Transportation Needs:   . Film/video editor (Medical):   Marland Kitchen Lack of Transportation (Non-Medical):   Physical Activity:   . Days of Exercise per Week:   . Minutes of Exercise per  Session:   Stress:   . Feeling of Stress :   Social Connections:   . Frequency of Communication with Friends and Family:   . Frequency of Social Gatherings  with Friends and Family:   . Attends Religious Services:   . Active Member of Clubs or Organizations:   . Attends Archivist Meetings:   Marland Kitchen Marital Status:      Family History: The patient's family history includes Cancer - Lung in her father; Heart disease in her mother.  ROS:   Please see the history of present illness.     All other systems reviewed and are negative.  EKGs/Labs/Other Studies Reviewed:    The following studies were reviewed today:  Cath 08/12/2019  Prox LAD-1 lesion is 25% stenosed.  Post intervention, there is a 0% residual stenosis.  Prox LAD-2 lesion is 100% stenosed.  Origin to Mid Graft lesion is 99% stenosed.  Mid RCA to Dist RCA lesion is 75% stenosed.  A stent was successfully placed.  A stent was successfully placed.    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  Ostial LAD stent with focal ISR at the ostium to proximal diagonal region.  Stenosis is greater than 90%.  The LAD is totally occluded beyond the origin of the first diagonal.  Circumflex is large and has irregularities with up to 50 to 60% mid and 30% distal distal native disease.  The proximal stent is widely patent and may extend slightly into the distal left main.  The right coronary is dominant and has diffuse heavily calcified disease throughout the mid vessel and up to 75% stenosis.    LV function is normal.  LVEDP is normal.  Successful but technically difficult angioplasty followed by re-stenting of the ostial diagonal.  A 3.0 x 8 Onyx was overlapped with the proximal one fourth of the previously placed stent and deployed at 14 atm x 2 with TIMI grade III flow.  Recommendations:   If angina continues, consider orbital atherectomy of the mid segment  of the right coronary.  Will need to be femoral approach.  Aggressive risk factor modification.  Continue aspirin and Plavix.  Discharge in a.m. if stable.   Cath 08/16/2019  Mid RCA to Dist RCA lesion is 75% stenosed.  Post intervention, there is a 0% residual stenosis.  A drug-eluting stent was successfully placed using a STENT RESOLUTE ONYX 2.5X34.   1. Successful PCI of the mid RCA using orbital atherectomy and DES x 1  Plan: DAPT for at least one year. Anticipate DC tomorrow.    EKG:  EKG is ordered today.  The ekg ordered today demonstrates normal sinus rhythm, right bundle branch block.  Recent Labs: 04/27/2019: TSH 1.16 08/17/2019: ALT 26; BUN 15; Creatinine, Ser 0.88; Hemoglobin 8.9; Platelets 292; Potassium 4.7; Sodium 139  Recent Lipid Panel    Component Value Date/Time   CHOL 162 04/27/2019 0000   CHOL 131 07/05/2018 1152   TRIG 48 04/27/2019 0000   HDL 89 (A) 04/27/2019 0000   HDL 83 07/05/2018 1152   CHOLHDL 1.6 07/05/2018 1152   CHOLHDL 1.8 04/15/2010 0400   VLDL 3 04/15/2010 0400   LDLCALC 63 04/27/2019 0000   LDLCALC 38 07/05/2018 1152    Physical Exam:    VS:  BP 128/62   Pulse (!) 57   Ht 5' 4" (1.626 m)   Wt 160 lb (72.6 kg)   BMI 27.46 kg/m     Wt Readings from Last 3 Encounters:  08/26/19 160 lb (72.6 kg)  08/17/19 162 lb 1.6 oz (73.5 kg)  08/03/19 164 lb 12.8 oz (74.8 kg)  GEN:  Well nourished, well developed in no acute distress HEENT: Normal NECK: No JVD; No carotid bruits LYMPHATICS: No lymphadenopathy CARDIAC: RRR, no murmurs, rubs, gallops RESPIRATORY:  Clear to auscultation without rales, wheezing or rhonchi  ABDOMEN: Soft, non-tender, non-distended MUSCULOSKELETAL:  No edema; No deformity  SKIN: Warm and dry NEUROLOGIC:  Alert and oriented x 3 PSYCHIATRIC:  Normal affect   ASSESSMENT:    1. Coronary artery disease involving coronary bypass graft of native heart with other forms of angina pectoris ([place])   2.  Hyperlipidemia LDL goal <70   3. Medication management   4. Essential hypertension   5. Controlled type 2 diabetes mellitus without complication, without long-term current use of insulin ([place])   6. Anemia, unspecified type    PLAN:    In order of problems listed above:  1. CAD s/p CABG: Recently underwent DES x2.  Continue on aspirin and Brilinta.  Since cardiac catheterization, she continued to have intermittent chest discomfort, she had occluded septal perforator artery during the initial cardiac catheterization due to plaque shift.  I recommend the addition of 2.5 mg daily of amlodipine for antianginal purposes  2. Hypertension: Blood pressure stable.  Addition of low-dose amlodipine for antianginal purposes  3. Hyperlipidemia: Continue statin therapy.  Repeat fasting lipid panel.  4. DM2: Managed by primary care provider  5. Anemia: Repeat CBC   Medication Adjustments/Labs and Tests Ordered: Current medicines are reviewed at length with the patient today.  Concerns regarding medicines are outlined above.  Orders Placed This Encounter  Procedures  . Lipid panel  . CBC  . EKG 12-Lead   Meds ordered this encounter  Medications  . amLODipine (NORVASC) 2.5 MG tablet    Sig: Take 1 tablet (2.5 mg total) by mouth daily.    Dispense:  90 tablet    Refill:  3    Patient Instructions  Medication Instructions:  Start Amlodipine  2.50m Daily *If you need a refill on your cardiac medications before your next appointment, please call your pharmacy*   Lab Work: CBC, Lipid Panel If you have labs (blood work) drawn today and your tests are completely normal, you will receive your results only by: .Marland KitchenMyChart Message (if you have MyChart) OR . A paper copy in the mail If you have any lab test that is abnormal or we need to change your treatment, we will call you to review the results.   Testing/Procedures: None   Follow-Up: [facility] you and your health needs are our  priority.  As part of our continuing mission to provide you with exceptional heart care, we have created designated Provider Care Teams.  These Care Teams include your primary Cardiologist (physician) and Advanced Practice Providers (APPs -  Physician Assistants and Nurse Practitioners) who all work together to provide you with the care you need, when you need it.  We recommend signing up for the patient portal called "MyChart".  Sign up information is provided on this After Visit Summary.  MyChart is used to connect with patients for Virtual Visits (Telemedicine).  Patients are able to view lab/test results, encounter notes, upcoming appointments, etc.  Non-urgent messages can be sent to your provider as well.   To learn more about what you can do with MyChart, go to [email or web address]    Your next appointment:   1 month(s)  The format for your next appointment:   In Person  Provider:   MAlmyra DeforestPA-C   Other Instructions Schedule Appointment  with Dr. Alma Friendly in 3 months    Signed, Almyra Deforest, [place]  08/28/2019 11:10 PM    [place]

## 2019-08-28 ENCOUNTER — Encounter: Payer: Self-pay | Admitting: Physician Assistant

## 2019-08-30 ENCOUNTER — Other Ambulatory Visit: Payer: Self-pay | Admitting: "Endocrinology

## 2019-09-28 ENCOUNTER — Other Ambulatory Visit: Payer: Self-pay

## 2019-09-28 ENCOUNTER — Encounter: Payer: Self-pay | Admitting: Physician Assistant

## 2019-09-28 ENCOUNTER — Ambulatory Visit (INDEPENDENT_AMBULATORY_CARE_PROVIDER_SITE_OTHER): Payer: Commercial Managed Care - PPO | Admitting: Physician Assistant

## 2019-09-28 VITALS — BP 115/72 | HR 84 | Temp 94.6°F | Ht 64.0 in | Wt 162.4 lb

## 2019-09-28 DIAGNOSIS — E119 Type 2 diabetes mellitus without complications: Secondary | ICD-10-CM

## 2019-09-28 DIAGNOSIS — E785 Hyperlipidemia, unspecified: Secondary | ICD-10-CM

## 2019-09-28 DIAGNOSIS — I10 Essential (primary) hypertension: Secondary | ICD-10-CM

## 2019-09-28 DIAGNOSIS — I25810 Atherosclerosis of coronary artery bypass graft(s) without angina pectoris: Secondary | ICD-10-CM | POA: Diagnosis not present

## 2019-09-28 DIAGNOSIS — I200 Unstable angina: Secondary | ICD-10-CM

## 2019-09-28 MED ORDER — TICAGRELOR 90 MG PO TABS: 90.0000 mg | ORAL_TABLET | Freq: Two times a day (BID) | ORAL | 3 refills | Status: DC

## 2019-09-28 NOTE — Patient Instructions (Signed)
Medication Instructions:  Your physician recommends that you continue on your current medications as directed. Please refer to the Current Medication list given to you today.  *If you need a refill on your cardiac medications before your next appointment, please call your pharmacy*  Lab Work: NONE ordered at this time of appointment   If you have labs (blood work) drawn today and your tests are completely normal, you will receive your results only by: Marland Kitchen MyChart Message (if you have MyChart) OR . A paper copy in the mail If you have any lab test that is abnormal or we need to change your treatment, we will call you to review the results.  Testing/Procedures: NONE ordered at this time of appointment   Follow-Up: At [facility], you and your health needs are our priority.  As part of our continuing mission to provide you with exceptional heart care, we have created designated Provider Care Teams.  These Care Teams include your primary Cardiologist (physician) and Advanced Practice Providers (APPs -  Physician Assistants and Nurse Practitioners) who all work together to provide you with the care you need, when you need it.  We recommend signing up for the patient portal called "MyChart".  Sign up information is provided on this After Visit Summary.  MyChart is used to connect with patients for Virtual Visits (Telemedicine).  Patients are able to view lab/test results, encounter notes, upcoming appointments, etc.  Non-urgent messages can be sent to your provider as well.   To learn more about what you can do with MyChart, go to [email or web address].    Your next appointment:   As scheduled 12/20/2019 at 9 AM   The format for your next appointment:   In Person  Provider:   K. Italy Hilty, MD  Other Instructions

## 2019-09-28 NOTE — Progress Notes (Signed)
Cardiology Office Note:    Date:  09/30/2019   ID:  Tamara Nunez, DOB 1955/05/14, MRN [id number]  PCP:  Theodoro Kos, MD  [facility] Cardiologist:  Chrystie Nose, MD  [facility] Electrophysiologist:  None   Referring MD: Theodoro Kos, MD   Chief Complaint  Patient presents with  . Follow-up    seen for Dr. Rennis Golden    History of Present Illness:    Tamara Nunez is a 64 y.o. female with a hx of HTN,HLD, DM 2, fibromyalgia andCAD s/p CABG 2006 with LIMA-LAD, SVG-distal LAD. Repeat cardiac catheterization in 2009 showed widely patent grafts. She has significant headache on Imdur. She underwent cardiac catheterization by Dr. Herbie Baltimore in July 2018 demonstrating 80% proximal RCA lesion treated with 2.5 x 8 mm DES, she was also found to have atretic LIMA to LAD with100% occlusion to mid LAD, SVG to distal LAD was normal. Due to worsening fatigue and exertional symptoms, she underwent another cardiac catheterization on 06/25/2018, this showed 100% proximal to mid LAD occlusion, 70% ostial OM 2, patent left circumflex stent, 90% ostial D1 lesion treated with a 2.5 x 16 mm DES, EF 55 to 65%.  I last saw the patient on 08/03/2019 at which time she complained of recurrent exertional shortness of breath and the chest discomfort.  After discussing with Dr. Allyson Sabal, we recommend a repeat cardiac catheterization.  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  Patient underwent successful drug-eluting placement to the diagonal lesion overlapping with the previously placed stent.  Postprocedure, she did have troponin elevation, cath film was reviewed and it was believed she had a occluded septal perforator due to plaque shift.  Due to recurrent angina, she  eventually underwent stenting of mid RCA lesion on 08/16/2019.  Her previous Plavix has been switched to Brilinta.  Nitrate was discontinued and she was switched from simvastatin to atorvastatin during this admission.  I saw the patient back on 08/26/2019, she was still having occasional chest discomfort requiring nitroglycerin.  Symptoms did not occur consistently with the degree of exertion.  I recommended addition of low-dose amlodipine 2.5 mg daily.  Patient presents today for cardiology office visit.  Since starting on the amlodipine, her chest pain has resolved.  She has also started on the cardiac rehab.  She is doing very well during the cardiac rehab.  She has no lower extremity edema, orthopnea or PND.  She can follow-up with Dr. Rennis Golden in October.   Past Medical History:  Diagnosis Date  . Arthritis    "in my back" (05/06/2017)  . CAD (coronary artery disease)    CABG 2006, 09/2016 DES RCA, 04/2017 DES Circ, 06/2018 DES 1st diag  . Chronic back pain   . Coronary artery disease    a. s/p CABG in 2006 with LIMA-LAD, SVG-dLAD. b. cath in 2009 showing patent grafts. c. 09/2016: cath with occluded LAD but patent SVG-dLAD. LIMA was atretic. pRCA with 80% stenosis --> treated with DES  . Fibromyalgia   . GERD (gastroesophageal reflux disease)   . Hyperlipidemia   . Hypertension   . Type II diabetes mellitus (HCC)     Past Surgical History:  Procedure Laterality Date  . ABDOMINAL HERNIA REPAIR  2018  . BACK SURGERY    . CARDIAC  CATHETERIZATION  01/13/2008  . COLONOSCOPY    . CORONARY ANGIOPLASTY WITH STENT PLACEMENT  09/26/2016   Prox RCA lesion, 80 %stenosed. DES   . CORONARY ARTERY BYPASS GRAFT  12/23/2004   2 vessel - LIMA to LAD (atretic), SVG to distal LAD  . CORONARY ATHERECTOMY N/A 08/16/2019   Procedure: CORONARY ATHERECTOMY;  Surgeon: Swaziland, Peter M, MD;  Location: [facility] INVASIVE CV LAB;  Service: Cardiovascular;  Laterality: N/A;  . CORONARY STENT INTERVENTION N/A 09/26/2016    Procedure: Coronary Stent Intervention;  Surgeon: Marykay Lex, MD;  Location: [facility] INVASIVE CV LAB;  Service: Cardiovascular;  Laterality: N/A;  . CORONARY STENT INTERVENTION N/A 05/06/2017   Procedure: CORONARY STENT INTERVENTION;  Surgeon: Kathleene Hazel, MD;  Location: [facility] INVASIVE CV LAB;  Service: Cardiovascular;  Laterality: N/A;  . CORONARY STENT INTERVENTION N/A 06/25/2018   Procedure: CORONARY STENT INTERVENTION;  Surgeon: Swaziland, Peter M, MD;  Location: [facility] INVASIVE CV LAB;  Service: Cardiovascular;  Laterality: N/A;  . CORONARY STENT INTERVENTION N/A 08/12/2019   Procedure: CORONARY STENT INTERVENTION;  Surgeon: Lyn Records, MD;  Location: [facility] INVASIVE CV LAB;  Service: Cardiovascular;  Laterality: N/A;  . CORONARY STENT INTERVENTION N/A 08/16/2019   Procedure: CORONARY STENT INTERVENTION;  Surgeon: Swaziland, Peter M, MD;  Location: [facility] INVASIVE CV LAB;  Service: Cardiovascular;  Laterality: N/A;  . DOPPLER ECHOCARDIOGRAPHY  04/12/2008   EF 60%  . GASTRIC BYPASS  2009  . HERNIA REPAIR    . LEFT HEART CATH AND CORONARY ANGIOGRAPHY N/A 05/06/2017   Procedure: LEFT HEART CATH AND CORONARY ANGIOGRAPHY;  Surgeon: Kathleene Hazel, MD;  Location: [facility] INVASIVE CV LAB;  Service: Cardiovascular;  Laterality: N/A;  . LEFT HEART CATH AND CORS/GRAFTS ANGIOGRAPHY N/A 09/26/2016   Procedure: Left Heart Cath and Cors/Grafts Angiography;  Surgeon: Marykay Lex, MD;  Location: [facility] INVASIVE CV LAB;  Service: Cardiovascular;  Laterality: N/A;  . LEFT HEART CATH AND CORS/GRAFTS ANGIOGRAPHY N/A 06/25/2018   Procedure: LEFT HEART CATH AND CORS/GRAFTS ANGIOGRAPHY;  Surgeon: Swaziland, Peter M, MD;  Location: [facility] INVASIVE CV LAB;  Service: Cardiovascular;  Laterality: N/A;  . LEFT HEART CATH AND CORS/GRAFTS ANGIOGRAPHY N/A 08/12/2019   Procedure: LEFT HEART CATH AND CORS/GRAFTS ANGIOGRAPHY;  Surgeon: Lyn Records, MD;  Location: [facility] INVASIVE CV LAB;  Service: Cardiovascular;  Laterality: N/A;  . NM MYOVIEW  LTD  06/03/2012   EF 71%  . POSTERIOR FUSION LUMBAR SPINE  1990s  . TUBAL LIGATION    . VAGINAL HYSTERECTOMY     "partial"    Current Medications: Current Meds  Medication Sig  . acetaminophen (TYLENOL) 500 MG tablet Take 1,000 mg by mouth every 6 (six) hours as needed for moderate pain or headache.  . albuterol (PROVENTIL HFA;VENTOLIN HFA) 108 (90 BASE) MCG/ACT inhaler Inhale 2 puffs into the lungs every 6 (six) hours as needed for wheezing.  Marland Kitchen amLODipine (NORVASC) 2.5 MG tablet Take 1 tablet (2.5 mg total) by mouth daily.  Marland Kitchen aspirin EC 81 MG tablet Take 81 mg by mouth daily.  Marland Kitchen atorvastatin (LIPITOR) 40 MG tablet Take 1 tablet (40 mg total) by mouth daily.  . cetirizine (ZYRTEC) 10 MG tablet Take 10 mg by mouth daily.   Marland Kitchen CO-ENZYME Q-10 PO Take 200 mg by mouth daily.   . diclofenac sodium (VOLTAREN) 1 % GEL Apply 2 g topically 4 (four) times daily as needed (pain).   . Fingerstix Lancets MISC Inject into the skin.  . fluticasone (FLONASE) 50  MCG/ACT nasal spray Place 1 spray into both nostrils daily.   . furosemide (LASIX) 40 MG tablet Take 1 tablet (40 mg total) by mouth daily.  Marland Kitchen. gabapentin (NEURONTIN) 600 MG tablet Take 600 mg by mouth 3 (three) times daily.   Marland Kitchen. lidocaine (LIDODERM) 5 % Place 2-3 patches onto the skin daily as needed (pain). Remove & Discard patch within 12 hours or as directed by MD   . methocarbamol (ROBAXIN) 500 MG tablet Take 1 tablet (500 mg total) by mouth every 8 (eight) hours as needed for muscle spasms.  . metoprolol tartrate (LOPRESSOR) 50 MG tablet Take 1 tablet (50 mg total) by mouth 2 (two) times daily.  . nitroGLYCERIN (NITROSTAT) 0.4 MG SL tablet Place 1 tablet (0.4 mg total) under the tongue every 5 (five) minutes as needed for chest pain (up to 3 doses).  Letta Pate. ONETOUCH ULTRA test strip Test BG qid  . pantoprazole (PROTONIX) 40 MG tablet Take 40 mg by mouth daily as needed (acid reflux).   . polyethylene glycol powder (GLYCOLAX/MIRALAX) powder Take 17 g  by mouth daily.   . ranolazine (RANEXA) 1000 MG SR tablet Take 1 tablet (1,000 mg total) by mouth 2 (two) times daily.  . vitamin B-12 (CYANOCOBALAMIN) 1000 MCG tablet Take 1,000 mcg by mouth daily.  . Vitamin D, Ergocalciferol, (DRISDOL) 1.25 MG (50000 UNIT) CAPS capsule TAKE 1 CAPSULE BY MOUTH EVERY 7 DAYS  . vitamin E 400 UNIT capsule Take 400 Units by mouth daily.  Marland Kitchen. VITRON-C 65-125 MG TABS Take 1 tablet by mouth 2 (two) times daily.  . [DISCONTINUED] ticagrelor (BRILINTA) 90 MG TABS tablet Take 1 tablet (90 mg total) by mouth 2 (two) times daily.     Allergies:   Codeine, Other, Ciprofloxacin, Valsartan, Adhesive [tape], and Sulfa antibiotics   Social History   Socioeconomic History  . Marital status: Married    Spouse name: Not on file  . Number of children: Not on file  . Years of education: Not on file  . Highest education level: Not on file  Occupational History  . Not on file  Tobacco Use  . Smoking status: Never Smoker  . Smokeless tobacco: Never Used  Vaping Use  . Vaping Use: Never used  Substance and Sexual Activity  . Alcohol use: No  . Drug use: No  . Sexual activity: Not on file  Other Topics Concern  . Not on file  Social History Narrative  . Not on file   Social Determinants of Health   Financial Resource Strain:   . Difficulty of Paying Living Expenses:   Food Insecurity:   . Worried About Programme researcher, broadcasting/film/videounning Out of Food in the Last Year:   . Baristaan Out of Food in the Last Year:   Transportation Needs:   . Freight forwarderLack of Transportation (Medical):   Marland Kitchen. Lack of Transportation (Non-Medical):   Physical Activity:   . Days of Exercise per Week:   . Minutes of Exercise per Session:   Stress:   . Feeling of Stress :   Social Connections:   . Frequency of Communication with Friends and Family:   . Frequency of Social Gatherings with Friends and Family:   . Attends Religious Services:   . Active Member of Clubs or Organizations:   . Attends BankerClub or Organization Meetings:     Marland Kitchen. Marital Status:      Family History: The patient's family history includes Cancer - Lung in her father; Heart disease in her mother.  ROS:  Please see the history of present illness.     All other systems reviewed and are negative.  EKGs/Labs/Other Studies Reviewed:    The following studies were reviewed today:  Cath 08/16/2019  Mid RCA to Dist RCA lesion is 75% stenosed.  Post intervention, there is a 0% residual stenosis.  A drug-eluting stent was successfully placed using a STENT RESOLUTE ONYX 2.5X34.   1. Successful PCI of the mid RCA using orbital atherectomy and DES x 1  Plan: DAPT for at least one year. Anticipate DC tomorrow.    EKG:  EKG is not ordered today.    Recent Labs: 04/27/2019: TSH 1.16 08/17/2019: ALT 26; BUN 15; Creatinine, Ser 0.88; Hemoglobin 8.9; Platelets 292; Potassium 4.7; Sodium 139  Recent Lipid Panel    Component Value Date/Time   CHOL 162 04/27/2019 0000   CHOL 131 07/05/2018 1152   TRIG 48 04/27/2019 0000   HDL 89 (A) 04/27/2019 0000   HDL 83 07/05/2018 1152   CHOLHDL 1.6 07/05/2018 1152   CHOLHDL 1.8 04/15/2010 0400   VLDL 3 04/15/2010 0400   LDLCALC 63 04/27/2019 0000   LDLCALC 38 07/05/2018 1152    Physical Exam:    VS:  BP 115/72   Pulse 84   Temp (!) 94.6 F (34.8 C)   Ht  (1.626 m)   Wt 162 lb 6.4 oz (73.7 kg)   SpO2 98%   BMI 27.88 kg/m     Wt Readings from Last 3 Encounters:  09/28/19 162 lb 6.4 oz (73.7 kg)  08/26/19 160 lb (72.6 kg)  08/17/19 162 lb 1.6 oz (73.5 kg)     GEN:  Well nourished, well developed in no acute distress HEENT: Normal NECK: No JVD; No carotid bruits LYMPHATICS: No lymphadenopathy CARDIAC: RRR, no murmurs, rubs, gallops RESPIRATORY:  Clear to auscultation without rales, wheezing or rhonchi  ABDOMEN: Soft, non-tender, non-distended MUSCULOSKELETAL:  No edema; No deformity  SKIN: Warm and dry NEUROLOGIC:  Alert and oriented x 3 PSYCHIATRIC:  Normal affect   ASSESSMENT:     1. Coronary artery disease involving coronary bypass graft of native heart without angina pectoris   2. Essential hypertension   3. Hyperlipidemia LDL goal <70   4. Controlled type 2 diabetes mellitus without complication, without long-term current use of insulin (HCC)    PLAN:    In order of problems listed above:  1. CAD s/p CABG: Since addition of amlodipine, her chest pain has resolved.  She is starting cardiac rehab.  Continue aspirin and Brilinta  2. Hypertension: Blood pressure stable  3. Hyperlipidemia: Continue statin therapy  4. DM2: Managed by primary care provider.   Medication Adjustments/Labs and Tests Ordered: Current medicines are reviewed at length with the patient today.  Concerns regarding medicines are outlined above.  No orders of the defined types were placed in this encounter.  No orders of the defined types were placed in this encounter.   Patient Instructions  Medication Instructions:  Your physician recommends that you continue on your current medications as directed. Please refer to the Current Medication list given to you today.  *If you need a refill on your cardiac medications before your next appointment, please call your pharmacy*  Lab Work: NONE ordered at this time of appointment   If you have labs (blood work) drawn today and your tests are completely normal, you will receive your results only by: Marland Kitchen MyChart Message (if you have MyChart) OR . A paper copy in the mail  If you have any lab test that is abnormal or we need to change your treatment, we will call you to review the results.  Testing/Procedures: NONE ordered at this time of appointment   Follow-Up: At [facility], you and your health needs are our priority.  As part of our continuing mission to provide you with exceptional heart care, we have created designated Provider Care Teams.  These Care Teams include your primary Cardiologist (physician) and Advanced Practice  Providers (APPs -  Physician Assistants and Nurse Practitioners) who all work together to provide you with the care you need, when you need it.  We recommend signing up for the patient portal called "MyChart".  Sign up information is provided on this After Visit Summary.  MyChart is used to connect with patients for Virtual Visits (Telemedicine).  Patients are able to view lab/test results, encounter notes, upcoming appointments, etc.  Non-urgent messages can be sent to your provider as well.   To learn more about what you can do with MyChart, go to [email or web address].    Your next appointment:   As scheduled 12/20/2019 at 9 AM   The format for your next appointment:   In Person  Provider:   K. Italy Hilty, MD  Other Instructions      Signed, Azalee Course, PA  09/30/2019 12:42 AM    [facility]

## 2019-09-30 ENCOUNTER — Encounter: Payer: Self-pay | Admitting: Physician Assistant

## 2019-12-20 ENCOUNTER — Ambulatory Visit: Payer: Commercial Managed Care - PPO | Admitting: Internal Medicine

## 2019-12-20 ENCOUNTER — Other Ambulatory Visit: Payer: Self-pay | Admitting: Internal Medicine

## 2020-01-11 LAB — VITAMIN D 25 HYDROXY (VIT D DEFICIENCY, FRACTURES): Vit D, 25-Hydroxy: 28

## 2020-01-11 LAB — HEMOGLOBIN A1C: Hemoglobin A1C: 6.1

## 2020-01-11 LAB — BASIC METABOLIC PANEL
BUN: 17 (ref 4–21)
Creatinine: 0.9 (ref 0.5–1.1)

## 2020-01-11 LAB — TSH: TSH: 0.8 (ref 0.41–5.90)

## 2020-01-11 LAB — LIPID PANEL
Cholesterol: 136 (ref 0–200)
HDL: 83 — AB (ref 35–70)
LDL Cholesterol: 41
Triglycerides: 61 (ref 40–160)

## 2020-01-18 ENCOUNTER — Encounter: Payer: Self-pay | Admitting: "Endocrinology

## 2020-01-18 ENCOUNTER — Ambulatory Visit (INDEPENDENT_AMBULATORY_CARE_PROVIDER_SITE_OTHER): Payer: Commercial Managed Care - PPO | Admitting: "Endocrinology

## 2020-01-18 ENCOUNTER — Encounter: Payer: Commercial Managed Care - PPO | Attending: "Endocrinology | Admitting: Nutrition

## 2020-01-18 ENCOUNTER — Encounter: Payer: Self-pay | Admitting: Nutrition

## 2020-01-18 ENCOUNTER — Other Ambulatory Visit: Payer: Self-pay

## 2020-01-18 VITALS — Wt 166.0 lb

## 2020-01-18 VITALS — BP 114/50 | HR 64 | Ht 64.0 in | Wt 167.0 lb

## 2020-01-18 DIAGNOSIS — E559 Vitamin D deficiency, unspecified: Secondary | ICD-10-CM

## 2020-01-18 DIAGNOSIS — Z951 Presence of aortocoronary bypass graft: Secondary | ICD-10-CM | POA: Diagnosis present

## 2020-01-18 DIAGNOSIS — I10 Essential (primary) hypertension: Secondary | ICD-10-CM | POA: Diagnosis not present

## 2020-01-18 DIAGNOSIS — E1159 Type 2 diabetes mellitus with other circulatory complications: Secondary | ICD-10-CM | POA: Diagnosis not present

## 2020-01-18 DIAGNOSIS — Z9884 Bariatric surgery status: Secondary | ICD-10-CM | POA: Insufficient documentation

## 2020-01-18 DIAGNOSIS — E782 Mixed hyperlipidemia: Secondary | ICD-10-CM | POA: Insufficient documentation

## 2020-01-18 DIAGNOSIS — I200 Unstable angina: Secondary | ICD-10-CM

## 2020-01-18 DIAGNOSIS — E119 Type 2 diabetes mellitus without complications: Secondary | ICD-10-CM

## 2020-01-18 NOTE — Patient Instructions (Signed)

## 2020-01-18 NOTE — Progress Notes (Signed)
  Medical Nutrition Therapy:  Appt start time: 1130  end time: 1200 Assessment:  Primary concerns today: Diabetes Type 2, controlled,.    A1C 6.1% today.  Changes made: Walking more and got back on track with eating habits. Saw Dr. Fransico Him today. Has been walking more.  Eating 2 meals a day. Tends to skip meals at time. Notes she has been craving sweets more recently since she has been skipping meals. Doesn't cook much due to her husband not at home during the week and it's only her.  Willing to work on Research officer, trade union Or Smart one meals for meals when not cooking for better balanced meals.  CMP Latest Ref Rng & Units 01/11/2020 08/17/2019 08/16/2019  Glucose 70 - 99 mg/dL - 824(M) 83  BUN 4 - 21 17 15  24(H)  Creatinine 0.5 - 1.1 0.9 0.88 0.92  Sodium 135 - 145 mmol/L - 139 139  Potassium 3.5 - 5.1 mmol/L - 4.7 4.5  Chloride 98 - 111 mmol/L - 110 107  CO2 22 - 32 mmol/L - 22 23  Calcium 8.9 - 10.3 mg/dL - 8.6(L) 8.8(L)  Total Protein 6.5 - 8.1 g/dL - 6.7 -  Total Bilirubin 0.3 - 1.2 mg/dL - 0.5 -  Alkaline Phos 38 - 126 U/L - 91 -  AST 15 - 41 U/L - 52(H) -  ALT 0 - 44 U/L - 26 -     Lab Results  Component Value Date   HGBA1C 6.1 01/11/2020     Preferred Learning Style:     No preference indicated   Learning Readiness:   Ready  Change in progress   MEDICATIONS:    DIETARY INTAKE:  B) coffee, egg sandwich, water  L) CHicken tenders, 1 ww bread, orange, water D) sweet potato, water  Usual physical activity: ADL  Estimated energy needs: 1200  calories 135 g carbohydrates 90 g protein 33 g fat  Progress Towards Goal(s):  In progress.   Nutritional Diagnosis:  NB-1.1 Food and nutrition-related knowledge deficit As related to PreDm .  As evidenced by A1C 6.35.    Intervention: Nutrition and Diabetes education provided on My Plate, CHO counting, meal planning, portion sizes, timing of meals, avoiding snacks between meals unless having a low blood sugar,  target ranges for A1C and blood sugars, signs/symptoms and treatment of hyper/hypoglycemia, monitoring blood sugars, taking medications as prescribed, benefits of exercising 30 minutes per day and prevention of complications of DM.  Goals  Increase lower carb vegetables  With meals Don't skip meals  Try to get in 60-70 g of protein per day. Try Healthy Choice or Smart Ones meals as needed. Keep drinking water   Teaching Method Utilized:  Visual Auditory Hands on  Handouts given during visit include:  The Plate Method   Meal Plan Card  Diabetes Instructions.   Barriers to learning/adherence to lifestyle change: none  Demonstrated degree of understanding via:  Teach Back   Monitoring/Evaluation:  Dietary intake, exercise,  , and body weight in  6 month(s).

## 2020-01-18 NOTE — Patient Instructions (Addendum)
Goals  Increase lower carb vegetables  With meals Don't skip meals  Try to get in 60-70 g of protein per day. Try Healthy Choice or Smart Ones meals as needed. Keep drinking water

## 2020-01-18 NOTE — Progress Notes (Signed)
01/18/2020, 11:47 AM            Endocrinology follow-up note  Subjective:    Patient ID: Tamara Nunez, female    DOB: 07/01/1955.  Tamara Nunez is being seen in follow-up for type 2 diabetes which is currently controlled on diet and exercise with A1c of 6.3%.    PMD:   Theodoro Kos, MD.   Past Medical History:  Diagnosis Date  . Arthritis    "in my back" (05/06/2017)  . CAD (coronary artery disease)    CABG 2006, 09/2016 DES RCA, 04/2017 DES Circ, 06/2018 DES 1st diag  . Chronic back pain   . Coronary artery disease    a. s/p CABG in 2006 with LIMA-LAD, SVG-dLAD. b. cath in 2009 showing patent grafts. c. 09/2016: cath with occluded LAD but patent SVG-dLAD. LIMA was atretic. pRCA with 80% stenosis --> treated with DES  . Fibromyalgia   . GERD (gastroesophageal reflux disease)   . Hyperlipidemia   . Hypertension   . Type II diabetes mellitus (HCC)     Past Surgical History:  Procedure Laterality Date  . ABDOMINAL HERNIA REPAIR  2018  . BACK SURGERY    . CARDIAC CATHETERIZATION  01/13/2008  . COLONOSCOPY    . CORONARY ANGIOPLASTY WITH STENT PLACEMENT  09/26/2016   Prox RCA lesion, 80 %stenosed. DES   . CORONARY ARTERY BYPASS GRAFT  12/23/2004   2 vessel - LIMA to LAD (atretic), SVG to distal LAD  . CORONARY ATHERECTOMY N/A 08/16/2019   Procedure: CORONARY ATHERECTOMY;  Surgeon: Swaziland, Peter M, MD;  Location: [facility] INVASIVE CV LAB;  Service: Cardiovascular;  Laterality: N/A;  . CORONARY STENT INTERVENTION N/A 09/26/2016   Procedure: Coronary Stent Intervention;  Surgeon: Marykay Lex, MD;  Location: [facility] INVASIVE CV LAB;  Service: Cardiovascular;  Laterality: N/A;  . CORONARY STENT INTERVENTION N/A 05/06/2017   Procedure: CORONARY STENT INTERVENTION;  Surgeon: Kathleene Hazel, MD;  Location: [facility] INVASIVE CV LAB;  Service: Cardiovascular;  Laterality: N/A;  . CORONARY STENT  INTERVENTION N/A 06/25/2018   Procedure: CORONARY STENT INTERVENTION;  Surgeon: Swaziland, Peter M, MD;  Location: [facility] INVASIVE CV LAB;  Service: Cardiovascular;  Laterality: N/A;  . CORONARY STENT INTERVENTION N/A 08/12/2019   Procedure: CORONARY STENT INTERVENTION;  Surgeon: Lyn Records, MD;  Location: [facility] INVASIVE CV LAB;  Service: Cardiovascular;  Laterality: N/A;  . CORONARY STENT INTERVENTION N/A 08/16/2019   Procedure: CORONARY STENT INTERVENTION;  Surgeon: Swaziland, Peter M, MD;  Location: [facility] INVASIVE CV LAB;  Service: Cardiovascular;  Laterality: N/A;  . DOPPLER ECHOCARDIOGRAPHY  04/12/2008   EF 60%  . GASTRIC BYPASS  2009  . HERNIA REPAIR    . LEFT HEART CATH AND CORONARY ANGIOGRAPHY N/A 05/06/2017   Procedure: LEFT HEART CATH AND CORONARY ANGIOGRAPHY;  Surgeon: Kathleene Hazel, MD;  Location: [facility] INVASIVE CV LAB;  Service: Cardiovascular;  Laterality: N/A;  . LEFT HEART CATH AND CORS/GRAFTS ANGIOGRAPHY N/A 09/26/2016   Procedure: Left Heart Cath and Cors/Grafts Angiography;  Surgeon: Marykay Lex, MD;  Location: [facility] INVASIVE CV LAB;  Service: Cardiovascular;  Laterality: N/A;  . LEFT HEART CATH AND CORS/GRAFTS ANGIOGRAPHY N/A 06/25/2018   Procedure: LEFT HEART CATH AND CORS/GRAFTS ANGIOGRAPHY;  Surgeon: SwazilandJordan, Peter M, MD;  [facility];  Service: Cardiovascular;  Laterality: N/A;  . LEFT HEART CATH AND CORS/GRAFTS ANGIOGRAPHY N/A 08/12/2019   Procedure: LEFT HEART CATH AND CORS/GRAFTS ANGIOGRAPHY;  Surgeon: Lyn RecordsSmith, Henry W, MD;  Location: [facility] INVASIVE CV LAB;  Service: Cardiovascular;  Laterality: N/A;  . NM MYOVIEW LTD  06/03/2012   EF 71%  . POSTERIOR FUSION LUMBAR SPINE  1990s  . TUBAL LIGATION    . VAGINAL HYSTERECTOMY     "partial"    Social History   Socioeconomic History  . Marital status: Married    Spouse name: Not on file  . Number of children: Not on file  . Years of education: Not on file  . Highest education level: Not on file  Occupational History   . Not on file  Tobacco Use  . Smoking status: Never Smoker  . Smokeless tobacco: Never Used  Vaping Use  . Vaping Use: Never used  Substance and Sexual Activity  . Alcohol use: No  . Drug use: No  . Sexual activity: Not on file  Other Topics Concern  . Not on file  Social History Narrative  . Not on file   Social Determinants of Health   Financial Resource Strain:   . Difficulty of Paying Living Expenses: Not on file  Food Insecurity:   . Worried About Programme researcher, broadcasting/film/videounning Out of Food in the Last Year: Not on file  . Ran Out of Food in the Last Year: Not on file  Transportation Needs:   . Lack of Transportation (Medical): Not on file  . Lack of Transportation (Non-Medical): Not on file  Physical Activity:   . Days of Exercise per Week: Not on file  . Minutes of Exercise per Session: Not on file  Stress:   . Feeling of Stress : Not on file  Social Connections:   . Frequency of Communication with Friends and Family: Not on file  . Frequency of Social Gatherings with Friends and Family: Not on file  . Attends Religious Services: Not on file  . Active Member of Clubs or Organizations: Not on file  . Attends BankerClub or Organization Meetings: Not on file  . Marital Status: Not on file    Family History  Problem Relation Age of Onset  . Heart disease Mother   . Cancer - Lung Father     Outpatient Encounter Medications as of 01/18/2020  Medication Sig  . escitalopram (LEXAPRO) 20 MG tablet Take 20 mg by mouth daily.  Marland Kitchen. acetaminophen (TYLENOL) 500 MG tablet Take 1,000 mg by mouth every 6 (six) hours as needed for moderate pain or headache.  . albuterol (PROVENTIL HFA;VENTOLIN HFA) 108 (90 BASE) MCG/ACT inhaler Inhale 2 puffs into the lungs every 6 (six) hours as needed for wheezing.  Marland Kitchen. amLODipine (NORVASC) 2.5 MG tablet Take 1 tablet (2.5 mg total) by mouth daily.  Marland Kitchen. aspirin EC 81 MG tablet Take 81 mg by mouth daily.  Marland Kitchen. atorvastatin (LIPITOR) 40 MG tablet Take 1 tablet (40 mg total) by  mouth daily.  . cetirizine (ZYRTEC) 10 MG tablet Take 10 mg by mouth daily.   Marland Kitchen. CO-ENZYME Q-10 PO Take 200 mg by mouth daily.   . diclofenac sodium (VOLTAREN) 1 % GEL Apply 2 g topically 4 (four) times daily as needed (pain).   .Marland Kitchen  dicyclomine (BENTYL) 10 MG capsule Take 10 mg by mouth every 6 (six) hours as needed.  . Fingerstix Lancets MISC Inject into the skin.  . fluticasone (FLONASE) 50 MCG/ACT nasal spray Place 1 spray into both nostrils daily.   . furosemide (LASIX) 40 MG tablet TAKE 1 TABLET BY MOUTH  DAILY  . gabapentin (NEURONTIN) 600 MG tablet Take 600 mg by mouth 3 (three) times daily.   Marland Kitchen lidocaine (LIDODERM) 5 % Place 2-3 patches onto the skin daily as needed (pain). Remove & Discard patch within 12 hours or as directed by MD   . metoprolol tartrate (LOPRESSOR) 50 MG tablet Take 1 tablet (50 mg total) by mouth 2 (two) times daily.  . nitroGLYCERIN (NITROSTAT) 0.4 MG SL tablet Place 1 tablet (0.4 mg total) under the tongue every 5 (five) minutes as needed for chest pain (up to 3 doses).  Tamara Nunez test strip Test BG qid  . pantoprazole (PROTONIX) 40 MG tablet Take 40 mg by mouth daily as needed (acid reflux).   . polyethylene glycol powder (GLYCOLAX/MIRALAX) powder Take 17 g by mouth daily.   . ranolazine (RANEXA) 1000 MG SR tablet Take 1 tablet (1,000 mg total) by mouth 2 (two) times daily.  . ticagrelor (BRILINTA) 90 MG TABS tablet Take 1 tablet (90 mg total) by mouth 2 (two) times daily.  . vitamin B-12 (CYANOCOBALAMIN) 1000 MCG tablet Take 1,000 mcg by mouth daily.  . Vitamin D, Ergocalciferol, (DRISDOL) 1.25 MG (50000 UNIT) CAPS capsule TAKE 1 CAPSULE BY MOUTH EVERY 7 DAYS  . vitamin E 400 UNIT capsule Take 400 Units by mouth daily.  Marland Kitchen VITRON-C 65-125 MG TABS Take 1 tablet by mouth 2 (two) times daily.  . [DISCONTINUED] methocarbamol (ROBAXIN) 500 MG tablet Take 1 tablet (500 mg total) by mouth every 8 (eight) hours as needed for muscle spasms.   No facility-administered  encounter medications on file as of 01/18/2020.    ALLERGIES: Allergies  Allergen Reactions  . Codeine Hives and Shortness Of Breath       . Other Anaphylaxis    Nuts  . Ciprofloxacin Itching  . Valsartan Rash  . Adhesive [Tape] Itching    "clear tape" used after surgery."took all her skin off".  Paper take is ok  . Sulfa Antibiotics Nausea Only    VACCINATION STATUS:  There is no immunization history on file for this patient.  Diabetes She presents for her follow-up diabetic visit. She has type 2 diabetes mellitus. Onset time: She was diagnosed at approximate age of 34 years. Her disease course has been improving. There are no hypoglycemic associated symptoms. Pertinent negatives for hypoglycemia include no confusion, headaches, pallor or seizures. Pertinent negatives for diabetes include no chest pain, no fatigue, no polydipsia, no polyphagia and no polyuria. There are no hypoglycemic complications. Symptoms are worsening. Diabetic complications include heart disease. Risk factors for coronary artery disease include dyslipidemia, diabetes mellitus, hypertension, sedentary lifestyle and post-menopausal. When asked about current treatments, none were reported. Her weight is decreasing steadily. She is following a generally unhealthy diet. When asked about meal planning, she reported none. She has not had a previous visit with a dietitian. She never participates in exercise. Her home blood glucose trend is decreasing steadily. (She presents with controlled glycemic profile.  Her point-of-care A1c 6.3%, staying stable.  He has no hypoglycemia.   ) An ACE inhibitor/angiotensin II receptor blocker is being taken. Eye exam is current.  Hyperlipidemia This is a chronic problem. The current  episode started more than 1 year ago. The problem is controlled. Exacerbating diseases include diabetes. Pertinent negatives include no chest pain, myalgias or shortness of breath. Risk factors for coronary  artery disease include dyslipidemia, diabetes mellitus, hypertension, a sedentary lifestyle and post-menopausal.  Hypertension This is a chronic problem. The problem is controlled. Pertinent negatives include no chest pain, headaches, palpitations or shortness of breath. Risk factors for coronary artery disease include dyslipidemia, diabetes mellitus, sedentary lifestyle, family history and post-menopausal state. Past treatments include ACE inhibitors. Hypertensive end-organ damage includes CAD/MI.    Review of systems: Limited as above.  Objective:    BP (!) 114/50   Pulse 64   Ht  (1.626 m)   Wt 167 lb (75.8 kg)   BMI 28.67 kg/m   Wt Readings from Last 3 Encounters:  01/18/20 166 lb (75.3 kg)  01/18/20 167 lb (75.8 kg)  09/28/19 162 lb 6.4 oz (73.7 kg)       Physical Exam- Limited  Constitutional:  Body mass index is 28.67 kg/m. , not in acute distress, normal state of mind Eyes:  EOMI, no exophthalmos Neck: Supple Thyroid: No gross goiter Respiratory: Adequate breathing efforts Musculoskeletal: no gross deformities, strength intact in all four extremities, no gross restriction of joint movements Skin:  no rashes, no hyperemia Neurological: no tremor with outstretched hands    CMP     Component Value Date/Time   NA 139 08/17/2019 0510   NA 141 08/09/2019 0831   K 4.7 08/17/2019 0510   CL 110 08/17/2019 0510   CO2 22 08/17/2019 0510   GLUCOSE 101 (H) 08/17/2019 0510   BUN 17 01/11/2020 0000   CREATININE 0.9 01/11/2020 0000   CREATININE 0.88 08/17/2019 0510   CREATININE 0.94 10/18/2018 1000   CALCIUM 8.6 (L) 08/17/2019 0510   PROT 6.7 08/17/2019 1005   PROT 7.1 01/01/2018 0900   ALBUMIN 3.2 (L) 08/17/2019 1005   ALBUMIN 4.2 01/01/2018 0900   AST 52 (H) 08/17/2019 1005   ALT 26 08/17/2019 1005   ALKPHOS 91 08/17/2019 1005   BILITOT 0.5 08/17/2019 1005   BILITOT 0.3 01/01/2018 0900   GFRNONAA >60 08/17/2019 0510   GFRNONAA 64 10/18/2018 1000   GFRAA  >60 08/17/2019 0510   GFRAA 74 10/18/2018 1000     Diabetic Labs (most recent): Lab Results  Component Value Date   HGBA1C 6.1 01/11/2020   HGBA1C 6.3 04/27/2019   HGBA1C 6.3 (H) 01/25/2019     Lipid Panel ( most recent) Lipid Panel     Component Value Date/Time   CHOL 136 01/11/2020 0000   CHOL 131 07/05/2018 1152   TRIG 61 01/11/2020 0000   HDL 83 (A) 01/11/2020 0000   HDL 83 07/05/2018 1152   CHOLHDL 1.6 07/05/2018 1152   CHOLHDL 1.8 04/15/2010 0400   VLDL 3 04/15/2010 0400   LDLCALC 41 01/11/2020 0000   LDLCALC 38 07/05/2018 1152      Lab Results  Component Value Date   TSH 0.80 01/11/2020   TSH 1.16 04/27/2019   TSH 1.47 05/12/2018   TSH 1.540 01/01/2018   TSH 1.268 04/14/2010       Assessment & Plan:   1. DM type 2 causing vascular disease (HCC)   - NELLINE LIO has currently controlled  type 2 DM since  64 years of age. -She is not on any medication for diabetes at this time. She presents with controlled glycemic profile.  Her point-of-care A1c 6.1%, staying stable.  He  has no hypoglycemia.    -her diabetes is complicated by coronary artery disease, sedentary life, and she remains at a high risk for more acute and chronic complications which include CAD, CVA, CKD, retinopathy, and neuropathy. These are all discussed in detail with her.  - I have counseled her on diet management and weight loss, by adopting a carbohydrate restricted/protein rich diet.  - she  admits there is a room for improvement in her diet and drink choices. -  Suggestion is made for her to avoid simple carbohydrates  from her diet including Cakes, Sweet Desserts / Pastries, Ice Cream, Soda (diet and regular), Sweet Tea, Candies, Chips, Cookies, Sweet Pastries,  Store Bought Juices, Alcohol in Excess of  1-2 drinks a day, Artificial Sweeteners, Coffee Creamer, and "Sugar-free" Products. This will help patient to have stable blood glucose profile and potentially avoid unintended  weight gain.  - I encouraged her to switch to  unprocessed or minimally processed complex starch and increased protein intake (animal or plant source), fruits, and vegetables.  - she is advised to stick to a routine mealtimes to eat 3 meals  a day and avoid unnecessary snacks ( to snack only to correct hypoglycemia).  - I have approached her with the following individualized plan to manage  her diabetes and patient agrees:   -Her presentation with tightly controlled glycemic profile and stable A1c of 6.1% without any medications gives her a chance to stay off of medications at this time.   -She does have anemia for which she is being treated with iron supplements, her hemoglobin is 11.7 g/dl.  - Patient specific target  A1c;  LDL, HDL, Triglycerides, were discussed in detail.  2) Blood Pressure /Hypertension:  Her blood pressure is controlled to target. -   she is advised to continue her current medications including  Ramipril 5 mg p.o. daily with breakfast .  3) Lipids/Hyperlipidemia:   Review of her recent lipid panel showed controlled LDL at 41. she  is advised to continue simvastatin 20 mg p.o. daily at bedtime.     4)  Weight/Diet: Her BMI is 28.4-She is  a candidate for some  major weight loss, I discussed with her the fact that loss of 5 - 10% of her  current body weight will have the most impact on her diabetes management.  CDE Consult will be initiated . Exercise, and detailed carbohydrates information provided  -  detailed on discharge instructions.  5) vitamin D deficiency: -Her 25-hydroxy vitamin D has improved to 29.  She will continue to benefit from supplements.    She is advised to take vitamin D2 50,000 units weekly and vitamin D3 1000 units daily until next measurement.   6) Chronic Care/Health Maintenance:  -she  is on ACEI/ARB and Statin medications and  is encouraged to initiate and continue to follow up with Ophthalmology, Dentist,  Podiatrist at least yearly or  according to recommendations, and advised to   stay away from smoking. I have recommended yearly flu vaccine and pneumonia vaccine at least every 5 years; moderate intensity exercise for up to 150 minutes weekly; and  sleep for at least 7 hours a day.  - she is  advised to maintain close follow up with Theodoro Kos, MD for primary care needs, as well as her other providersfor optimal and coordinated care.   - Time spent on this patient care encounter:  35 min, of which > 50% was spent in  counseling and the rest  reviewing her blood glucose logs , discussing her hypoglycemia and hyperglycemia episodes, reviewing her current and  previous labs / studies  ( including abstraction from other facilities) and medications  doses and developing a  long term treatment plan and documenting her care.   Please refer to Patient Instructions for Blood Glucose Monitoring and Insulin/Medications Dosing Guide"  in media tab for additional information. Please  also refer to " Patient Self Inventory" in the Media  tab for reviewed elements of pertinent patient history.  Derwood Kaplan participated in the discussions, expressed understanding, and voiced agreement with the above plans.  All questions were answered to her satisfaction. she is encouraged to contact clinic should she have any questions or concerns prior to her return visit.   Follow up plan: - Return in about 6 months (around 07/17/2020) for A1c -NV.  Marquis Lunch, MD [facility] [address] [place], [place] [address] Phone: [phone]  Fax: [phone]    01/18/2020, 11:47 AM  This note was partially dictated with voice recognition software. Similar sounding words can be transcribed inadequately or may not  be corrected upon review.

## 2020-01-20 ENCOUNTER — Encounter: Payer: Self-pay | Admitting: Internal Medicine

## 2020-01-20 ENCOUNTER — Other Ambulatory Visit: Payer: Self-pay

## 2020-01-20 ENCOUNTER — Ambulatory Visit (INDEPENDENT_AMBULATORY_CARE_PROVIDER_SITE_OTHER): Payer: Commercial Managed Care - PPO | Admitting: Internal Medicine

## 2020-01-20 VITALS — BP 122/74 | HR 60 | Ht 65.0 in | Wt 169.2 lb

## 2020-01-20 DIAGNOSIS — I25708 Atherosclerosis of coronary artery bypass graft(s), unspecified, with other forms of angina pectoris: Secondary | ICD-10-CM

## 2020-01-20 DIAGNOSIS — G5790 Unspecified mononeuropathy of unspecified lower limb: Secondary | ICD-10-CM | POA: Diagnosis not present

## 2020-01-20 DIAGNOSIS — M79606 Pain in leg, unspecified: Secondary | ICD-10-CM | POA: Diagnosis not present

## 2020-01-20 DIAGNOSIS — Z951 Presence of aortocoronary bypass graft: Secondary | ICD-10-CM | POA: Diagnosis not present

## 2020-01-20 DIAGNOSIS — R209 Unspecified disturbances of skin sensation: Secondary | ICD-10-CM

## 2020-01-20 DIAGNOSIS — I200 Unstable angina: Secondary | ICD-10-CM

## 2020-01-20 NOTE — Patient Instructions (Signed)
Medication Instructions:  Your physician recommends that you continue on your current medications as directed. Please refer to the Current Medication list given to you today.  *If you need a refill on your cardiac medications before your next appointment, please call your pharmacy*  Testing/Procedures: Lower Extremity Arterial Doppler @ Dr. Blanchie Dessert office   Follow-Up: At [facility] you and your health needs are our priority.  As part of our continuing mission to provide you with exceptional heart care, we have created designated Provider Care Teams.  These Care Teams include your primary Cardiologist (physician) and Advanced Practice Providers (APPs -  Physician Assistants and Nurse Practitioners) who all work together to provide you with the care you need, when you need it.  We recommend signing up for the patient portal called "MyChart".  Sign up information is provided on this After Visit Summary.  MyChart is used to connect with patients for Virtual Visits (Telemedicine).  Patients are able to view lab/test results, encounter notes, upcoming appointments, etc.  Non-urgent messages can be sent to your provider as well.   To learn more about what you can do with MyChart, go to [email or web address].    Your next appointment:   June 2022  The format for your next appointment:   In Person  Provider:   You may see Chrystie Nose, MD or one of the following Advanced Practice Providers on your designated Care Team:    Azalee Course, PA-C  Micah Flesher, PA-C or   Judy Pimple, [place]

## 2020-01-20 NOTE — Progress Notes (Signed)
OFFICE NOTE  Chief Complaint:  Follow-up heart cath  Primary Care Physician: Theodoro Kos, MD  HPI:  Tamara Nunez  is a 64 year old female with history of coronary disease. She had CABG in 2006, LIMA to LAD, saphenous vein graft to distal LAD. She had chest pain and some fatigue after that in 2009 and had a heart cath which showed widely patent grafts. She has continued to have atypical chest pain which worsens with positioning but seemed to improve with nitroglycerin spray. However, she was given isosorbide which she reports today has not helped her symptoms but reported it had helped her in the past. The main side effect is significant headache on isosorbide which is bothersome for her. She also takes Ranexa. She continues to have chest pain and heaviness mostly on her left side but is also a soreness. She says it is more sore when she pushes on the chest wall underneath the tail of the breast. She had a mammogram which currently was negative for any mass and, again, the symptoms do sound to be more musculoskeletal, possibly a radiculopathy either from the cervical root or perhaps a thoracic nerve that comes around the chest wall. She does have a history of reflux but has not had complaints regarding that and I had recommended a stress test to look for any reversible ischemia that could be causing her symptoms. The stress test performed on June 03, 2012, was low risk demonstrating no significant reversibility. The EKG did show some T-wave changes in 2, 3 and aVF and laterally V2 through V6 with Lexiscan that resolved post infusion. However, this is nonspecific.   She returns today with no specific complaints. She occasionally gets chest discomfort from time to time however the symptoms are well managed. She is complaining of some numbness feeling in her fingers and occasionally notes that there is some color change in her fingers in cold weather, which sounds like Raynaud's  phenomenon.  I saw Tamara Nunez back in the office today. She tells that she's had 2 surgeries since I saw her last. In September she underwent back surgery and has had marked improvement in her pain. About 2-3 weeks ago she had surgery on her left thumb for a trigger finger. She recovered from both of these well. She continues to have minor anginal symptoms on and off which is about baseline for her in the past. She tells me that she's been more fatigued recently and was noted to have mild anemia on laboratory work performed for her surgeries. It was recommended that she go on iron but she has not started that. I've encouraged her to follow-up with her primary care physician for this.  Today for follow-up. She was last seen over a year ago and has recently been ill. She was just discharged from [facility] a few days ago after she developed small bowel obstruction and what sounds a Perforated diverticulum. She underwent exploratory laparotomy and was hospitalized for a period of time. It does not sound like she had any cardiac issues during that procedure. She does have some chronic fatigue complaints and reports she is very fatigued today. She says it's been has been getting progressively worse she does not think is related to her abdominal infection.  10/27/2016  Tamara Nunez returns today for follow-up. She had noted progressive worsening of shortness of breath and chest discomfort which sound like typical angina and underwent left heart catheterization by Dr. Herbie Baltimore on 09/26/2016. This demonstrated a proximal  RCA lesion that was 80% stenosis. She had successful placement of a 2.5 x 8 mm drug-eluting stent. She was found to have an atretic LIMA to LAD with 100% occlusion of the mid LAD. Her saphenous vein graft to the distal LAD was normal. EF was 55-60%. She reports that she never had any significant improvement in her fatigue and discomfort. She still gets discomfort in the left chest which goes to her  back. She said her husband gave her a "bear hug" and she reported some pain in her mid back. This was reproducible today in the office. She also reports fatigue and discomfort in her chest with exertion that relieves with rest and is improved with sublingual nitroglycerin spray.  12/01/2016  Tamara Nunez returns today for follow-up. I placed her on isosorbide 30 mg daily for recurrent chest discomfort. She still feels some heaviness in her chest however it is improved. She says it's now more bearable and is able to do activities. It has not completely resolved. She has not required any sublingual nitroglycerin spray. She initially had some headache but that is resolved.  03/11/2017  Tamara Nunez was seen today in follow-up.  She still reports symptoms of angina.  She says her isosorbide wears off by about 1 or 2 PM and she has to take another one.  She then notes that it wears off around 7 at night.  She is interested in increasing the dose.  She is compliant with her ranolazine.  He has now been more than 6 months since her stenting.  She is considering radiofrequency ablation of her back for chronic back pain.  In order to accomplish that she need to come off of her Plavix.  07/27/2017  Tamara Nunez returns today for follow-up.  She continues to have chest discomfort.  She feels like she does not get adequate relief from the isosorbide.  In addition she is on Ranexa.  She says that she used to have benefit from it when she was receiving the name Ranexa however she is not convinced that the generic Ranexa is providing the same benefit.  I explained to her that my knowledge of the medication from a recent interaction with a drug representative is that the company is no longer planning to make a brand-name medication, therefore she will have no other options for treatment.  With regards to isosorbide, I again stated that there is no benefit to taking the medication 3 days versus twice a day.  Perhaps there could be benefit from  increase in the dose.  I am not totally convinced that her symptoms are all angina.  She does have significant thoracic and lumbar spine disease and may be having some pain related to radiculopathy.  She was told to use a lidocaine patch however she does not use that often.  11/02/2017  Tamara Nunez was seen today in follow-up.  She reports stable angina without new or worsening chest pain.  She denies any additional sublingual nitroglycerin use.  Her main concern today is lower extremity swelling.  She has peripheral neuropathy, which may be related to diabetes or chronic low back pain which is more likely.  She in fact is planning on an injection to her back today.  She has been holding Plavix for 5 days.  I advised her to start her Plavix up again 48 hours after her injection.  Her concern is that the swelling makes it difficult for her to get her shoes on.  Typically is worse in the evening and  gets better in the morning.  I suspect this is related to neuropathy.  She denies any worsening shortness of breath, weight gain, orthopnea or other heart failure symptoms.  01/01/2018  Tamara Nunez is seen today again in follow-up.  Unfortunately she has had some worsening chest pain.  She had to stop her Plavix for a few days for procedure and noticed that she felt worse off of it.  Over the past several weeks she has had some crescendo angina pain.  Worse when she is doing housework or other activities.  Is physically exhausting for her and she has to stop.  She generally feels okay at rest.  She is also had to take nitroglycerin several times for the symptoms on top of her isosorbide.  Currently she is on 90 mg twice daily as well as renal Lindzen thousand milligrams twice daily.  She did have a stent back in February of this year.  It seems unlikely she has had any significant progression of her epicardial coronary disease however is noted to have small vessel disease as well.  The symptoms however concerning for unstable  angina.  06/22/2018  Tamara Nunez is seen today in the office for symptoms concerning for unstable angina.  She had a video visit with Azalee CourseHao Meng, PA-C, last week.  She is currently on maximally tolerated antianginal therapy and describing what sounds like class III angina.  She is already on high-dose nitrate and Ranexa.  She was started on calcium channel blocker but had hypotension and felt very fatigued and imbalanced on the medication.  Her niece is a Engineer, civil (consulting)nurse and checked her blood pressure which was noted to be below 100 systolic.  She then discontinue the calcium channel blocker.  She said she did not have any anginal improvement.  Her symptoms are worse with exertion and are relieved by rest.  She notes that the symptoms are very similar to her symptoms prior to her previous coronary stent in February 2019.  She did have nuclear stress testing for similar symptoms that were not as severe in November 2019 which was negative for ischemia.  07/05/2018  Tamara Nunez returns today for follow-up of her heart cath.  This is performed by Dr. SwazilandJordan on 06/25/2018, which demonstrated single-vessel obstructive coronary disease with chronic total occlusion of the mid LAD, bypassed by a patent saphenous vein graft.  Additionally, there was a new 90% proximal first diagonal stenosis felt to be the culprit vessel.  This was successfully (albeit with difficulty) intervened upon with a drug-eluting stent x1.  She remains on dual antiplatelet therapy and had baseline anemia prior to the procedure.  Groin access was obtained, however she has had no signs or symptoms of bleeding, hematoma or pain in the groin since discharge.  She reports her chest pain is now resolved.  She still has some soreness in her chest wall, but otherwise has felt more like herself.  I am surprised at how aggressive her coronary disease has been.  Despite her having a well-controlled lipid profile with LDL well below 70.  She is only on moderate intensity statin.  I  would like to look further into why she has such aggressive coronary disease today.  01/20/2020  Tamara Nunez returns today for follow-up. She again underwent left heart catheterization by Dr. SwazilandJordan in June 2021 which demonstrated 75% mid to distal RCA stenosis and had a drug-eluting stent placed after orbital atherectomy. Overall the result was good. He had recommended dual antiplatelet therapy for at least a year.  She currently remains on aspirin and Brilinta. She says she is doing well without any anginal symptoms. She is complaining however of leg pain when walking. Not necessarily claudication for a prescribe distance but she does say she has achiness and restlessness. She has been on maximal doses of gabapentin without significant improvement. She is thought to have some degree of neuropathy due to diabetes. A1c however is well controlled at 61 and LDL is low in the 40s. From a medical standpoint she is is optimized I think is we can get her.  PMHx:  Past Medical History:  Diagnosis Date  . Arthritis    "in my back" (05/06/2017)  . CAD (coronary artery disease)    CABG 2006, 09/2016 DES RCA, 04/2017 DES Circ, 06/2018 DES 1st diag  . Chronic back pain   . Coronary artery disease    a. s/p CABG in 2006 with LIMA-LAD, SVG-dLAD. b. cath in 2009 showing patent grafts. c. 09/2016: cath with occluded LAD but patent SVG-dLAD. LIMA was atretic. pRCA with 80% stenosis --> treated with DES  . Fibromyalgia   . GERD (gastroesophageal reflux disease)   . Hyperlipidemia   . Hypertension   . Type II diabetes mellitus (HCC)     Past Surgical History:  Procedure Laterality Date  . ABDOMINAL HERNIA REPAIR  2018  . BACK SURGERY    . CARDIAC CATHETERIZATION  01/13/2008  . COLONOSCOPY    . CORONARY ANGIOPLASTY WITH STENT PLACEMENT  09/26/2016   Prox RCA lesion, 80 %stenosed. DES   . CORONARY ARTERY BYPASS GRAFT  12/23/2004   2 vessel - LIMA to LAD (atretic), SVG to distal LAD  . CORONARY ATHERECTOMY N/A  08/16/2019   Procedure: CORONARY ATHERECTOMY;  Surgeon: Swaziland, Peter M, MD;  Location: [facility] INVASIVE CV LAB;  Service: Cardiovascular;  Laterality: N/A;  . CORONARY STENT INTERVENTION N/A 09/26/2016   Procedure: Coronary Stent Intervention;  Surgeon: Marykay Lex, MD;  Location: [facility] INVASIVE CV LAB;  Service: Cardiovascular;  Laterality: N/A;  . CORONARY STENT INTERVENTION N/A 05/06/2017   Procedure: CORONARY STENT INTERVENTION;  Surgeon: Kathleene Hazel, MD;  Location: [facility] INVASIVE CV LAB;  Service: Cardiovascular;  Laterality: N/A;  . CORONARY STENT INTERVENTION N/A 06/25/2018   Procedure: CORONARY STENT INTERVENTION;  Surgeon: Swaziland, Peter M, MD;  Location: [facility] INVASIVE CV LAB;  Service: Cardiovascular;  Laterality: N/A;  . CORONARY STENT INTERVENTION N/A 08/12/2019   Procedure: CORONARY STENT INTERVENTION;  Surgeon: Lyn Records, MD;  Location: [facility] INVASIVE CV LAB;  Service: Cardiovascular;  Laterality: N/A;  . CORONARY STENT INTERVENTION N/A 08/16/2019   Procedure: CORONARY STENT INTERVENTION;  Surgeon: Swaziland, Peter M, MD;  Location: [facility] INVASIVE CV LAB;  Service: Cardiovascular;  Laterality: N/A;  . DOPPLER ECHOCARDIOGRAPHY  04/12/2008   EF 60%  . GASTRIC BYPASS  2009  . HERNIA REPAIR    . LEFT HEART CATH AND CORONARY ANGIOGRAPHY N/A 05/06/2017   Procedure: LEFT HEART CATH AND CORONARY ANGIOGRAPHY;  Surgeon: Kathleene Hazel, MD;  Location: [facility] INVASIVE CV LAB;  Service: Cardiovascular;  Laterality: N/A;  . LEFT HEART CATH AND CORS/GRAFTS ANGIOGRAPHY N/A 09/26/2016   Procedure: Left Heart Cath and Cors/Grafts Angiography;  Surgeon: Marykay Lex, MD;  Location: [facility] INVASIVE CV LAB;  Service: Cardiovascular;  Laterality: N/A;  . LEFT HEART CATH AND CORS/GRAFTS ANGIOGRAPHY N/A 06/25/2018   Procedure: LEFT HEART CATH AND CORS/GRAFTS ANGIOGRAPHY;  Surgeon: Swaziland, Peter M, MD;  Location: [facility] INVASIVE CV LAB;  Service: Cardiovascular;  Laterality:  N/A;  . LEFT HEART CATH AND CORS/GRAFTS  ANGIOGRAPHY N/A 08/12/2019   Procedure: LEFT HEART CATH AND CORS/GRAFTS ANGIOGRAPHY;  Surgeon: Lyn Records, MD;  Location: [facility] INVASIVE CV LAB;  Service: Cardiovascular;  Laterality: N/A;  . NM MYOVIEW LTD  06/03/2012   EF 71%  . POSTERIOR FUSION LUMBAR SPINE  1990s  . TUBAL LIGATION    . VAGINAL HYSTERECTOMY     "partial"    FAMHx:  Family History  Problem Relation Age of Onset  . Heart disease Mother   . Cancer - Lung Father     SOCHx:   reports that she has never smoked. She has never used smokeless tobacco. She reports that she does not drink alcohol and does not use drugs.  ALLERGIES:  Allergies  Allergen Reactions  . Codeine Hives and Shortness Of Breath       . Other Anaphylaxis    Nuts  . Ciprofloxacin Itching  . Valsartan Rash  . Adhesive [Tape] Itching    "clear tape" used after surgery."took all her skin off".  Paper take is ok  . Sulfa Antibiotics Nausea Only    ROS: Pertinent items noted in HPI and remainder of comprehensive ROS otherwise negative.  HOME MEDS: Current Outpatient Medications  Medication Sig Dispense Refill  . acetaminophen (TYLENOL) 500 MG tablet Take 1,000 mg by mouth every 6 (six) hours as needed for moderate pain or headache.    . albuterol (PROVENTIL HFA;VENTOLIN HFA) 108 (90 BASE) MCG/ACT inhaler Inhale 2 puffs into the lungs every 6 (six) hours as needed for wheezing.    Marland Kitchen aspirin EC 81 MG tablet Take 81 mg by mouth daily.    Marland Kitchen atorvastatin (LIPITOR) 40 MG tablet Take 1 tablet (40 mg total) by mouth daily. 30 tablet 6  . cetirizine (ZYRTEC) 10 MG tablet Take 10 mg by mouth daily.     Marland Kitchen CO-ENZYME Q-10 PO Take 200 mg by mouth daily.     . diclofenac sodium (VOLTAREN) 1 % GEL Apply 2 g topically 4 (four) times daily as needed (pain).     Marland Kitchen dicyclomine (BENTYL) 10 MG capsule Take 10 mg by mouth every 6 (six) hours as needed.    Tery Sanfilippo Calcium (STOOL SOFTENER PO) Take by mouth daily.    Marland Kitchen escitalopram (LEXAPRO) 20 MG tablet Take 20 mg  by mouth daily.    . Fingerstix Lancets MISC Inject into the skin.    . fluticasone (FLONASE) 50 MCG/ACT nasal spray Place 1 spray into both nostrils daily.     . furosemide (LASIX) 40 MG tablet TAKE 1 TABLET BY MOUTH  DAILY 90 tablet 2  . gabapentin (NEURONTIN) 600 MG tablet Take 600 mg by mouth 3 (three) times daily.     Marland Kitchen lidocaine (LIDODERM) 5 % Place 2-3 patches onto the skin daily as needed (pain). Remove & Discard patch within 12 hours or as directed by MD     . metoprolol tartrate (LOPRESSOR) 50 MG tablet Take 1 tablet (50 mg total) by mouth 2 (two) times daily. 60 tablet 6  . nitroGLYCERIN (NITROSTAT) 0.4 MG SL tablet Place 1 tablet (0.4 mg total) under the tongue every 5 (five) minutes as needed for chest pain (up to 3 doses). 25 tablet 3  . ONETOUCH ULTRA test strip Test BG qid 400 each 1  . pantoprazole (PROTONIX) 40 MG tablet Take 40 mg by mouth daily as needed (acid reflux).     . polyethylene glycol powder (GLYCOLAX/MIRALAX) powder Take  17 g by mouth daily as needed.     . ranolazine (RANEXA) 1000 MG SR tablet Take 1 tablet (1,000 mg total) by mouth 2 (two) times daily. 56 tablet 0  . ticagrelor (BRILINTA) 90 MG TABS tablet Take 1 tablet (90 mg total) by mouth 2 (two) times daily. 180 tablet 3  . vitamin B-12 (CYANOCOBALAMIN) 1000 MCG tablet Take 1,000 mcg by mouth daily.    . Vitamin D, Ergocalciferol, (DRISDOL) 1.25 MG (50000 UNIT) CAPS capsule TAKE 1 CAPSULE BY MOUTH EVERY 7 DAYS 12 capsule 0  . vitamin E 400 UNIT capsule Take 400 Units by mouth daily.    Marland Kitchen VITRON-C 65-125 MG TABS Take 1 tablet by mouth 2 (two) times daily.    Marland Kitchen amLODipine (NORVASC) 2.5 MG tablet Take 1 tablet (2.5 mg total) by mouth daily. 90 tablet 3   No current facility-administered medications for this visit.    LABS/IMAGING: No results found for this or any previous visit (from the past 48 hour(s)). No results found.  VITALS: BP 122/74 (BP Location: Left Arm, Patient Position: Sitting)   Pulse 60    Ht 5\' 5"  (1.651 m)   Wt 169 lb 3.2 oz (76.7 kg)   SpO2 99%   BMI 28.16 kg/m   EXAM: General appearance: alert and no distress Neck: no carotid bruit, no JVD and thyroid not enlarged, symmetric, no tenderness/mass/nodules Lungs: clear to auscultation bilaterally Heart: regular rate and rhythm, S1, S2 normal, no murmur, click, rub or gallop Abdomen: soft, non-tender; bowel sounds normal; no masses,  no organomegaly Extremities: extremities normal, atraumatic, no cyanosis or edema Pulses: 2+ and symmetric Skin: Skin color, texture, turgor normal. No rashes or lesions Neurologic: Grossly normal Psych: Mildly anxious  EKG: Normal sinus rhythm with sinus arrhythmia at 60, RBBB-personally reviewed  ASSESSMENT: 1. Unstable angina with recent PCI to the distal RCA with atherectomy and DES x 1 (08/2019) 2. Recent PCI with DES to the ostial circumflex with a 4 x 12 mm Synergy DES (04/2017) 3. Coronary artery disease status post CABG (LIMA to LAD-atretic, SVG to distal LAD) 4. Hypertension-controlled 5. Dyslipidemia-on statin 6. Diabetes type 2 - controlled 7. Tingling and coolness of the fingers, concerning for Raynaud's phenomena 8. Recent small bowel obstruction and status post exploratory laparotomy 9. RBBB 10. Lower extremity edema-secondary to neuropathy 11. Chronic back pain with peripheral neuropathy  PLAN: 1.   Mrs. Colonna again had recurrent coronary disease and was found to have a 75% stenosis of the mid to distal RCA. After intervention her symptoms have resolved. She has recently taken a couple nitroglycerin but her EKG is nonischemic. Overall she says she feels like she is doing well. She is concerned however about lower extremity pain. She has been diagnosed with peripheral neuropathy however requiring more medications. She reports cool extremities and seems to have decreased distal pulses worse on the left than the right. I am concerned about arterial insufficiency. Would  recommend lower extremity arterial Dopplers. We will plan to continue DAPT until next summer and then likely switch her from Brilinta to Plavix. Further work-up based on her Dopplers.  Follow-up with me next summer.  01-04-1977, MD, [facility], FACP  [facility]  Medical Director of the [facility] Attending Cardiologist  Direct Dial: [phone]  Fax: [phone]  Website:  [email or web address] Adora Yeh 01/20/2020, 10:06 AM

## 2020-01-25 ENCOUNTER — Ambulatory Visit: Payer: Commercial Managed Care - PPO | Admitting: "Endocrinology

## 2020-01-25 ENCOUNTER — Ambulatory Visit: Payer: Commercial Managed Care - PPO | Admitting: Nutrition

## 2020-02-16 ENCOUNTER — Other Ambulatory Visit: Payer: Self-pay | Admitting: Internal Medicine

## 2020-02-16 ENCOUNTER — Telehealth: Payer: Self-pay | Admitting: Internal Medicine

## 2020-02-16 ENCOUNTER — Other Ambulatory Visit: Payer: Self-pay

## 2020-02-16 ENCOUNTER — Ambulatory Visit (HOSPITAL_COMMUNITY)
Admission: RE | Admit: 2020-02-16 | Discharge: 2020-02-16 | Disposition: A | Discharge status: Home / Self Care | Payer: Commercial Managed Care - PPO | Source: Ambulatory Visit | Attending: Cardiology | Admitting: Cardiology

## 2020-02-16 DIAGNOSIS — M79605 Pain in left leg: Secondary | ICD-10-CM | POA: Diagnosis not present

## 2020-02-16 DIAGNOSIS — R209 Unspecified disturbances of skin sensation: Secondary | ICD-10-CM | POA: Insufficient documentation

## 2020-02-16 DIAGNOSIS — G5790 Unspecified mononeuropathy of unspecified lower limb: Secondary | ICD-10-CM | POA: Diagnosis present

## 2020-02-16 DIAGNOSIS — Z951 Presence of aortocoronary bypass graft: Secondary | ICD-10-CM | POA: Diagnosis not present

## 2020-02-16 DIAGNOSIS — M79604 Pain in right leg: Secondary | ICD-10-CM

## 2020-02-16 DIAGNOSIS — M79606 Pain in leg, unspecified: Secondary | ICD-10-CM | POA: Insufficient documentation

## 2020-02-16 NOTE — Telephone Encounter (Signed)
° °  Pt is returning call to get vascular results °

## 2020-02-17 ENCOUNTER — Telehealth: Payer: Self-pay | Admitting: Internal Medicine

## 2020-02-17 NOTE — Telephone Encounter (Signed)
Called patient to explain dopplers and reason for referral to Dr. Allyson Sabal.  DR H

## 2020-02-17 NOTE — Telephone Encounter (Signed)
Patient returning Nurse's call. Please call patient on her cell phone. She is having trouble with her land line

## 2020-02-17 NOTE — Telephone Encounter (Signed)
Spoke with the patient to communicate Dr. Blanchie Dessert recommendation that she be seen by Dr. Allyson Sabal for PV. The pt states she is okay with this and would like to be scheduled as soon as possible. However, she also requests that Dr. Rennis Golden speak with her directly about her exam results prior to seeing Dr. Allyson Sabal.  Will forward to MD and scheduling pool.

## 2020-02-17 NOTE — Telephone Encounter (Signed)
No answer

## 2020-02-17 NOTE — Telephone Encounter (Signed)
Follow Up:     Pt is returning Michelle's call from yesterday, concerning her lab results.

## 2020-02-19 ENCOUNTER — Other Ambulatory Visit: Payer: Self-pay

## 2020-02-19 ENCOUNTER — Encounter (HOSPITAL_COMMUNITY): Payer: Self-pay | Admitting: Emergency Medicine

## 2020-02-19 ENCOUNTER — Emergency Department (HOSPITAL_COMMUNITY)
Admission: EM | Admit: 2020-02-19 | Discharge: 2020-02-19 | Disposition: A | Discharge status: Home / Self Care | Payer: Commercial Managed Care - PPO | Attending: Emergency Medicine | Admitting: Emergency Medicine

## 2020-02-19 ENCOUNTER — Emergency Department (HOSPITAL_COMMUNITY): Payer: Commercial Managed Care - PPO

## 2020-02-19 DIAGNOSIS — I2510 Atherosclerotic heart disease of native coronary artery without angina pectoris: Secondary | ICD-10-CM | POA: Insufficient documentation

## 2020-02-19 DIAGNOSIS — R079 Chest pain, unspecified: Secondary | ICD-10-CM | POA: Diagnosis not present

## 2020-02-19 DIAGNOSIS — R0600 Dyspnea, unspecified: Secondary | ICD-10-CM | POA: Insufficient documentation

## 2020-02-19 DIAGNOSIS — Z951 Presence of aortocoronary bypass graft: Secondary | ICD-10-CM | POA: Insufficient documentation

## 2020-02-19 DIAGNOSIS — Z79899 Other long term (current) drug therapy: Secondary | ICD-10-CM | POA: Insufficient documentation

## 2020-02-19 DIAGNOSIS — E119 Type 2 diabetes mellitus without complications: Secondary | ICD-10-CM | POA: Diagnosis not present

## 2020-02-19 DIAGNOSIS — I10 Essential (primary) hypertension: Secondary | ICD-10-CM | POA: Insufficient documentation

## 2020-02-19 DIAGNOSIS — Z7982 Long term (current) use of aspirin: Secondary | ICD-10-CM | POA: Insufficient documentation

## 2020-02-19 LAB — TROPONIN I (HIGH SENSITIVITY)
Troponin I (High Sensitivity): 4 ng/L (ref <18)
Troponin I (High Sensitivity): 4 ng/L (ref <18)

## 2020-02-19 LAB — CBC
HCT: 34.9 % — ABNORMAL LOW (ref 36.0–46.0)
Hemoglobin: 10.6 g/dL — ABNORMAL LOW (ref 12.0–15.0)
MCH: 26.9 pg (ref 26.0–34.0)
MCHC: 30.4 g/dL (ref 30.0–36.0)
MCV: 88.6 fL (ref 80.0–100.0)
Platelets: 315 10*3/uL (ref 150–400)
RBC: 3.94 MIL/uL (ref 3.87–5.11)
RDW: 17.5 % — ABNORMAL HIGH (ref 11.5–15.5)
WBC: 5.5 10*3/uL (ref 4.0–10.5)
nRBC: 0 % (ref 0.0–0.2)

## 2020-02-19 LAB — BASIC METABOLIC PANEL
Anion gap: 8 (ref 5–15)
BUN: 12 mg/dL (ref 8–23)
CO2: 25 mmol/L (ref 22–32)
Calcium: 9.1 mg/dL (ref 8.9–10.3)
Chloride: 110 mmol/L (ref 98–111)
Creatinine, Ser: 0.76 mg/dL (ref 0.44–1.00)
GFR, Estimated: >60 mL/min (ref >60)
Glucose, Bld: 88 mg/dL (ref 70–99)
Potassium: 4.4 mmol/L (ref 3.5–5.1)
Sodium: 143 mmol/L (ref 135–145)

## 2020-02-19 MED ORDER — ASPIRIN 81 MG PO CHEW
324.0000 mg | CHEWABLE_TABLET | Freq: Once | ORAL | Status: DC
  Filled 2020-02-19: qty 4

## 2020-02-19 MED ORDER — HYDROCODONE-ACETAMINOPHEN 5-325 MG PO TABS
1.0000 | ORAL_TABLET | Freq: Once | ORAL | Status: AC
  Administered 2020-02-19: 1 via ORAL
  Filled 2020-02-19: qty 1

## 2020-02-19 MED ORDER — NITROGLYCERIN 0.4 MG SL SUBL: 0.4000 mg | SUBLINGUAL_TABLET | SUBLINGUAL | Status: DC | PRN

## 2020-02-19 NOTE — ED Provider Notes (Signed)
[facility] EMERGENCY DEPARTMENT Provider Note   CSN: [id number] Arrival date & time: 02/19/20  1619     History Chief Complaint  Patient presents with  . Chest Pain    Tamara Nunez is a 64 y.o. female.  HPI 64 year old female presents with chest pain. Started this morning and has been waxing and waning since. It is sharp and goes to her back as well. Started on the left chest and now is more middle. Feels similar to when she required a stent. Some dyspnea and she was sweating earlier. No vomiting. Tried 3 nitroglycerin this morning without much relief. Pain is currently an 8 out of 10.   Past Medical History:  Diagnosis Date  . Arthritis    "in my back" (05/06/2017)  . CAD (coronary artery disease)    CABG 2006, 09/2016 DES RCA, 04/2017 DES Circ, 06/2018 DES 1st diag  . Chronic back pain   . Coronary artery disease    a. s/p CABG in 2006 with LIMA-LAD, SVG-dLAD. b. cath in 2009 showing patent grafts. c. 09/2016: cath with occluded LAD but patent SVG-dLAD. LIMA was atretic. pRCA with 80% stenosis --> treated with DES  . Fibromyalgia   . GERD (gastroesophageal reflux disease)   . Hyperlipidemia   . Hypertension   . Type II diabetes mellitus [facility])     Patient Active Problem List   Diagnosis Date Noted  . Chronic anemia 08/17/2019  . Type 4a myocardial infarction (HCC) 08/17/2019  . CAD (coronary artery disease) 08/12/2019  . Degenerative lumbar spinal stenosis 01/28/2019  . Vitamin D deficiency 11/01/2018  . Unstable angina (HCC) 06/25/2018  . Bilateral lower extremity edema 11/02/2017  . CAD S/P percutaneous coronary angioplasty  05/07/2017  . Unstable angina pectoris (HCC)   . Preoperative cardiovascular examination 03/11/2017  . Essential hypertension, benign   . Mixed hyperlipidemia   . GERD (gastroesophageal reflux disease)   . Diabetes mellitus without complication (HCC)   . Coronary artery disease   . Coronary artery disease involving  coronary bypass graft of native heart with unstable angina pectoris (HCC) 10/27/2016  . Other fatigue 10/27/2016  . DOE (dyspnea on exertion) 10/27/2016  . Progressive angina (HCC)   . Atherosclerosis of native coronary artery of native heart without angina pectoris 11/29/2012  . S/P CABG x 2 11/29/2012  . Stable angina (HCC) 11/29/2012  . HTN (hypertension) 11/29/2012  . Dyslipidemia 11/29/2012  . DM type 2 causing vascular disease (HCC) 11/29/2012    Past Surgical History:  Procedure Laterality Date  . ABDOMINAL HERNIA REPAIR  2018  . BACK SURGERY    . CARDIAC CATHETERIZATION  01/13/2008  . COLONOSCOPY    . CORONARY ANGIOPLASTY WITH STENT PLACEMENT  09/26/2016   Prox RCA lesion, 80 %stenosed. DES   . CORONARY ARTERY BYPASS GRAFT  12/23/2004   2 vessel - LIMA to LAD (atretic), SVG to distal LAD  . CORONARY ATHERECTOMY N/A 08/16/2019   Procedure: CORONARY ATHERECTOMY;  Surgeon: Swaziland, Peter M, MD;  Location: [facility] INVASIVE CV LAB;  Service: Cardiovascular;  Laterality: N/A;  . CORONARY STENT INTERVENTION N/A 09/26/2016   Procedure: Coronary Stent Intervention;  Surgeon: Marykay Lex, MD;  Location: [facility] INVASIVE CV LAB;  Service: Cardiovascular;  Laterality: N/A;  . CORONARY STENT INTERVENTION N/A 05/06/2017   Procedure: CORONARY STENT INTERVENTION;  Surgeon: Kathleene Hazel, MD;  Location: [facility] INVASIVE CV LAB;  Service: Cardiovascular;  Laterality: N/A;  . CORONARY STENT INTERVENTION N/A 06/25/2018   Procedure:  CORONARY STENT INTERVENTION;  Surgeon: Swaziland, Peter M, MD;  Location: [facility] INVASIVE CV LAB;  Service: Cardiovascular;  Laterality: N/A;  . CORONARY STENT INTERVENTION N/A 08/12/2019   Procedure: CORONARY STENT INTERVENTION;  Surgeon: Lyn Records, MD;  Location: [facility] INVASIVE CV LAB;  Service: Cardiovascular;  Laterality: N/A;  . CORONARY STENT INTERVENTION N/A 08/16/2019   Procedure: CORONARY STENT INTERVENTION;  Surgeon: Swaziland, Peter M, MD;  Location: [facility] INVASIVE CV LAB;   Service: Cardiovascular;  Laterality: N/A;  . DOPPLER ECHOCARDIOGRAPHY  04/12/2008   EF 60%  . GASTRIC BYPASS  2009  . HERNIA REPAIR    . LEFT HEART CATH AND CORONARY ANGIOGRAPHY N/A 05/06/2017   Procedure: LEFT HEART CATH AND CORONARY ANGIOGRAPHY;  Surgeon: Kathleene Hazel, MD;  Location: [facility] INVASIVE CV LAB;  Service: Cardiovascular;  Laterality: N/A;  . LEFT HEART CATH AND CORS/GRAFTS ANGIOGRAPHY N/A 09/26/2016   Procedure: Left Heart Cath and Cors/Grafts Angiography;  Surgeon: Marykay Lex, MD;  Location: [facility] INVASIVE CV LAB;  Service: Cardiovascular;  Laterality: N/A;  . LEFT HEART CATH AND CORS/GRAFTS ANGIOGRAPHY N/A 06/25/2018   Procedure: LEFT HEART CATH AND CORS/GRAFTS ANGIOGRAPHY;  Surgeon: Swaziland, Peter M, MD;  Location: [facility] INVASIVE CV LAB;  Service: Cardiovascular;  Laterality: N/A;  . LEFT HEART CATH AND CORS/GRAFTS ANGIOGRAPHY N/A 08/12/2019   Procedure: LEFT HEART CATH AND CORS/GRAFTS ANGIOGRAPHY;  Surgeon: Lyn Records, MD;  Location: [facility] INVASIVE CV LAB;  Service: Cardiovascular;  Laterality: N/A;  . NM MYOVIEW LTD  06/03/2012   EF 71%  . POSTERIOR FUSION LUMBAR SPINE  1990s  . TUBAL LIGATION    . VAGINAL HYSTERECTOMY     "partial"     OB History   No obstetric history on file.     Family History  Problem Relation Age of Onset  . Heart disease Mother   . Cancer - Lung Father     Social History   Tobacco Use  . Smoking status: Never Smoker  . Smokeless tobacco: Never Used  Vaping Use  . Vaping Use: Never used  Substance Use Topics  . Alcohol use: No  . Drug use: No    Home Medications Prior to Admission medications   Medication Sig Start Date End Date Taking? Authorizing Provider  acetaminophen (TYLENOL) 500 MG tablet Take 1,000 mg by mouth every 6 (six) hours as needed for moderate pain or headache.    [provider]  albuterol (PROVENTIL HFA;VENTOLIN HFA) 108 (90 BASE) MCG/ACT inhaler Inhale 2 puffs into the lungs every 6 (six) hours as  needed for wheezing.    [provider]  amLODipine (NORVASC) 2.5 MG tablet Take 1 tablet (2.5 mg total) by mouth daily. 08/26/19 11/24/19  Azalee Course, PA  aspirin EC 81 MG tablet Take 81 mg by mouth daily.    [provider]  atorvastatin (LIPITOR) 40 MG tablet Take 1 tablet (40 mg total) by mouth daily. 08/17/19   Dunn, Tacey Ruiz, PA-C  cetirizine (ZYRTEC) 10 MG tablet Take 10 mg by mouth daily.     [provider]  CO-ENZYME Q-10 PO Take 200 mg by mouth daily.     [provider]  diclofenac sodium (VOLTAREN) 1 % GEL Apply 2 g topically 4 (four) times daily as needed (pain).  10/31/18   [provider]  dicyclomine (BENTYL) 10 MG capsule Take 10 mg by mouth every 6 (six) hours as needed. 01/06/20   [provider]  Docusate Calcium (STOOL SOFTENER PO)  Take by mouth daily.    [provider]  escitalopram (LEXAPRO) 20 MG tablet Take 20 mg by mouth daily.    [provider]  Fingerstix Lancets MISC Inject into the skin. 03/23/19   [provider]  fluticasone (FLONASE) 50 MCG/ACT nasal spray Place 1 spray into both nostrils daily.     [provider]  furosemide (LASIX) 40 MG tablet TAKE 1 TABLET BY MOUTH  DAILY 12/21/19   Hilty, Lisette Abu, MD  gabapentin (NEURONTIN) 600 MG tablet Take 600 mg by mouth 3 (three) times daily.  09/07/17   [provider]  lidocaine (LIDODERM) 5 % Place 2-3 patches onto the skin daily as needed (pain). Remove & Discard patch within 12 hours or as directed by MD     [provider]  metoprolol tartrate (LOPRESSOR) 50 MG tablet Take 1 tablet (50 mg total) by mouth 2 (two) times daily. 08/17/19   Dunn, Tacey Ruiz, PA-C  nitroGLYCERIN (NITROSTAT) 0.4 MG SL tablet Place 1 tablet (0.4 mg total) under the tongue every 5 (five) minutes as needed for chest pain (up to 3 doses). 08/17/19   Dunn, Tacey Ruiz, PA-C  ONETOUCH ULTRA test strip Test BG qid 12/29/18   Roma Kayser, MD   pantoprazole (PROTONIX) 40 MG tablet Take 40 mg by mouth daily as needed (acid reflux).     [provider]  polyethylene glycol powder (GLYCOLAX/MIRALAX) powder Take 17 g by mouth daily as needed.  06/09/15   [provider]  ranolazine (RANEXA) 1000 MG SR tablet Take 1 tablet (1,000 mg total) by mouth 2 (two) times daily. 06/16/13   Hilty, Lisette Abu, MD  ticagrelor (BRILINTA) 90 MG TABS tablet Take 1 tablet (90 mg total) by mouth 2 (two) times daily. 09/28/19   Hilty, Lisette Abu, MD  vitamin B-12 (CYANOCOBALAMIN) 1000 MCG tablet Take 1,000 mcg by mouth daily.    [provider]  Vitamin D, Ergocalciferol, (DRISDOL) 1.25 MG (50000 UNIT) CAPS capsule TAKE 1 CAPSULE BY MOUTH EVERY 7 DAYS 08/30/19   Roma Kayser, MD  vitamin E 400 UNIT capsule Take 400 Units by mouth daily.    [provider]  VITRON-C 65-125 MG TABS Take 1 tablet by mouth 2 (two) times daily. 06/01/19   [provider]    Allergies    Codeine, Other, Ciprofloxacin, Valsartan, Adhesive [tape], and Sulfa antibiotics  Review of Systems   Review of Systems  Constitutional: Positive for diaphoresis.  Respiratory: Positive for shortness of breath.   Cardiovascular: Positive for chest pain.  Gastrointestinal: Negative for abdominal pain and vomiting.  All other systems reviewed and are negative.   Physical Exam Updated Vital Signs BP 128/72   Pulse 74   Temp 98.3 F (36.8 C) (Oral)   Resp 16   SpO2 100%   Physical Exam Vitals and nursing note reviewed.  Constitutional:      General: She is not in acute distress.    Appearance: She is well-developed and well-nourished. She is not ill-appearing or diaphoretic.  HENT:     Head: Normocephalic and atraumatic.     Right Ear: External ear normal.     Left Ear: External ear normal.     Nose: Nose normal.  Eyes:     General:        Right eye: No discharge.        Left eye: No discharge.  Cardiovascular:     Rate and Rhythm:  Normal rate and  regular rhythm.     Heart sounds: Normal heart sounds.  Pulmonary:     Effort: Pulmonary effort is normal.     Breath sounds: Normal breath sounds.  Chest:     Chest wall: No tenderness.  Abdominal:     Palpations: Abdomen is soft.     Tenderness: There is no abdominal tenderness.  Skin:    General: Skin is warm and dry.  Neurological:     Mental Status: She is alert.  Psychiatric:        Mood and Affect: Mood is not anxious.     ED Results / Procedures / Treatments   Labs (all labs ordered are listed, but only abnormal results are displayed) Labs Reviewed  CBC - Abnormal; Notable for the following components:      Result Value   Hemoglobin 10.6 (*)    HCT 34.9 (*)    RDW 17.5 (*)    All other components within normal limits  BASIC METABOLIC PANEL  TROPONIN I (HIGH SENSITIVITY)  TROPONIN I (HIGH SENSITIVITY)    EKG EKG Interpretation  Date/Time:  Sunday February 19 2020 16:26:16 EST Ventricular Rate:  65 PR Interval:  120 QRS Duration: 142 QT Interval:  488 QTC Calculation: 507 R Axis:   52 Text Interpretation: Normal sinus rhythm Right bundle branch block Abnormal ECG similar to June 2021 Confirmed by Pricilla LovelessGoldston, Latorria Zeoli [phone]([id number]) on 02/19/2020 8:41:36 PM   Radiology DG Chest 2 View  Result Date: 02/19/2020 CLINICAL DATA:  Left to middle chest pain x this morning, SOB EXAM: CHEST - 2 VIEW COMPARISON:  Chest radiograph 04/14/2010 FINDINGS: Stable cardiomediastinal contours with surgical changes status post median sternotomy and CABG. The lungs are clear. No pneumothorax or pleural effusion. No acute finding in the visualized skeleton. IMPRESSION: No acute cardiopulmonary finding. Electronically Signed   By: Emmaline KluverNancy  Ballantyne M.D.   On: 02/19/2020 17:13    Procedures Procedures (including critical care time)  Medications Ordered in ED Medications  HYDROcodone-acetaminophen (NORCO/VICODIN) 5-325 MG per tablet 1 tablet (1 tablet Oral Given 02/19/20  2314)    ED Course  I have reviewed the triage vital signs and the nursing notes.  Pertinent labs & imaging results that were available during my care of the patient were reviewed by me and considered in my medical decision making (see chart for details).    MDM Rules/Calculators/A&P                          Patient presents with atypical chest pain.  Chest pain has been waxing and waning throughout the day.  Has 2 negative troponins.  ECG is similar to prior and without acute ischemia.  I discussed with Dr. Okey Dupreose of cardiology, given stable vitals and atypical nature, unlikely to be unstable angina.  Recommends close outpatient cardiology follow-up which she will help arrange.  My suspicion of PE, dissection is pretty low. Final Clinical Impression(s) / ED Diagnoses Final diagnoses:  Nonspecific chest pain    Rx / DC Orders ED Discharge Orders    None       Pricilla LovelessGoldston, Amauri Keefe, MD 02/19/20 [phone]

## 2020-02-19 NOTE — Discharge Instructions (Signed)
If you develop recurrent, continued, or worsening chest pain, shortness of breath, fever, vomiting, abdominal or back pain, or any other new/concerning symptoms then return to the ER for evaluation.  

## 2020-02-19 NOTE — ED Triage Notes (Signed)
Pt reports L sided chest pain and mild SOB since 3:30am.  Took 3 NTG without any relief.  Went to ED in [place], [place] prior to coming here.

## 2020-02-20 ENCOUNTER — Telehealth: Payer: Self-pay | Admitting: Internal Medicine

## 2020-02-20 NOTE — Telephone Encounter (Signed)
    Pt's husband would like to speak with Dr. Rennis Golden. He said if he can call him back as soon as possible

## 2020-02-20 NOTE — Telephone Encounter (Signed)
Patient scheduled for [facility] consult Feb 2022. Per notes, wanted visit ASAP Opening with Dr. Allyson Sabal on Dec 14 @ 2:15pm Left message for patient to call back if she wishes to schedule sooner visit

## 2020-02-20 NOTE — Telephone Encounter (Signed)
Returned the call to the patient's husband per the dpr. He stated that the patient went to the ED for chest pain last night. She was currently at home and feeling better. A follow up appointment was made for 12/21 per the patient request. She has been advised to call back if anything further is needed.

## 2020-02-22 ENCOUNTER — Telehealth: Payer: Self-pay

## 2020-02-22 NOTE — Telephone Encounter (Signed)
Patient had a missed call from [facility], was making sure no one was trying to reach her as there were several recent telephone visits. Was a reminder phone call

## 2020-02-24 ENCOUNTER — Other Ambulatory Visit: Payer: Self-pay

## 2020-02-24 ENCOUNTER — Encounter: Payer: Self-pay | Admitting: Cardiovascular Disease

## 2020-02-24 ENCOUNTER — Ambulatory Visit (INDEPENDENT_AMBULATORY_CARE_PROVIDER_SITE_OTHER): Payer: Commercial Managed Care - PPO | Admitting: Cardiovascular Disease

## 2020-02-24 DIAGNOSIS — I739 Peripheral vascular disease, unspecified: Secondary | ICD-10-CM

## 2020-02-24 MED ORDER — CILOSTAZOL 50 MG PO TABS: 50.0000 mg | ORAL_TABLET | Freq: Two times a day (BID) | ORAL | 3 refills | Status: DC

## 2020-02-24 NOTE — Patient Instructions (Signed)
Medication Instructions:  START Pletal (cilostazol) 50mg  twice daily.  *If you need a refill on your cardiac medications before your next appointment, please call your pharmacy*   Follow-Up: At [facility], you and your health needs are our priority.  As part of our continuing mission to provide you with exceptional heart care, we have created designated Provider Care Teams.  These Care Teams include your primary Cardiologist (physician) and Advanced Practice Providers (APPs -  Physician Assistants and Nurse Practitioners) who all work together to provide you with the care you need, when you need it.  We recommend signing up for the patient portal called "MyChart".  Sign up information is provided on this After Visit Summary.  MyChart is used to connect with patients for Virtual Visits (Telemedicine).  Patients are able to view lab/test results, encounter notes, upcoming appointments, etc.  Non-urgent messages can be sent to your provider as well.   To learn more about what you can do with MyChart, go to [facility].    Your next appointment:   3-4  month(s)  The format for your next appointment:   In Person  Provider:   [email or web address], MD

## 2020-02-24 NOTE — Assessment & Plan Note (Signed)
Tamara Nunez was referred to me by Dr. Rennis Golden for PAD.  She does have symptoms compatible with restless leg syndrome.  She also has CAD.  Dopplers performed 02/16/2020 showed noncompressible ABIs with occluded posterior tibial on the right, anterior tibial and posterior tibial on the left.  She had no large vessel disease.  There is no evidence of critical limb ischemia.  I am to start her on Pletal 50 mg p.o. twice daily and we will see her back in 3 to 4 months for follow-up.  There is no indication currently to perform an invasive evaluation.

## 2020-02-24 NOTE — Progress Notes (Signed)
02/24/2020 Tamara Nunez   25-Jan-1956  [id number]  Primary Physician Theodoro Kos, MD Primary Cardiologist: Runell Gess MD Tamara Nunez, [place], [place]  HPI:  Tamara Nunez is a 64 y.o. severely overweight African-American female accompanied by her cousin Tamara Nunez today.  She was referred by Dr. Rennis Golden for evaluation of symptomatic PAD.  She does have CAD status post remote CABG in 2006.  She also has treated hypertension, hyperlipidemia.  She recently had coronary invention by Dr. Swaziland with orbital atherectomy and stenting of her native RCA.  She has symptoms compatible with restless leg syndrome and had Dopplers performed 02/16/2020 revealing noncompressible ABIs with occluded PT on the right and AT and PT on the left.  She does complain of some claudication type symptoms as well.  There is no evidence of critical ischemia.   Current Meds  Medication Sig  . acetaminophen (TYLENOL) 500 MG tablet Take 1,000 mg by mouth every 6 (six) hours as needed for moderate pain or headache.  . albuterol (PROVENTIL HFA;VENTOLIN HFA) 108 (90 BASE) MCG/ACT inhaler Inhale 2 puffs into the lungs every 6 (six) hours as needed for wheezing.  Marland Kitchen amLODipine (NORVASC) 2.5 MG tablet Take 1 tablet (2.5 mg total) by mouth daily.  Marland Kitchen aspirin EC 81 MG tablet Take 81 mg by mouth daily.  Marland Kitchen atorvastatin (LIPITOR) 40 MG tablet Take 1 tablet (40 mg total) by mouth daily.  . cetirizine (ZYRTEC) 10 MG tablet Take 10 mg by mouth daily.  Marland Kitchen CO-ENZYME Q-10 PO Take 200 mg by mouth daily.   . diclofenac sodium (VOLTAREN) 1 % GEL Apply 2 g topically 4 (four) times daily as needed (pain).   Marland Kitchen dicyclomine (BENTYL) 10 MG capsule Take 10 mg by mouth every 6 (six) hours as needed.  Tamara Nunez Calcium (STOOL SOFTENER PO) Take by mouth daily.  Marland Kitchen escitalopram (LEXAPRO) 20 MG tablet Take 20 mg by mouth daily.  . Fingerstix Lancets MISC Inject into the skin.  . fluticasone (FLONASE) 50 MCG/ACT nasal spray Place 1 spray into both  nostrils daily.  . furosemide (LASIX) 40 MG tablet TAKE 1 TABLET BY MOUTH  DAILY  . gabapentin (NEURONTIN) 600 MG tablet Take 600 mg by mouth 3 (three) times daily.   Marland Kitchen lidocaine (LIDODERM) 5 % Place 2-3 patches onto the skin daily as needed (pain). Remove & Discard patch within 12 hours or as directed by MD  . metoprolol tartrate (LOPRESSOR) 50 MG tablet Take 1 tablet (50 mg total) by mouth 2 (two) times daily.  . nitroGLYCERIN (NITROSTAT) 0.4 MG SL tablet Place 1 tablet (0.4 mg total) under the tongue every 5 (five) minutes as needed for chest pain (up to 3 doses).  Tamara Nunez test strip Test BG qid  . pantoprazole (PROTONIX) 40 MG tablet Take 40 mg by mouth daily as needed (acid reflux).   . polyethylene glycol powder (GLYCOLAX/MIRALAX) powder Take 17 g by mouth daily as needed.   . ranolazine (RANEXA) 1000 MG SR tablet Take 1 tablet (1,000 mg total) by mouth 2 (two) times daily.  . ticagrelor (BRILINTA) 90 MG TABS tablet Take 1 tablet (90 mg total) by mouth 2 (two) times daily.  . vitamin B-12 (CYANOCOBALAMIN) 1000 MCG tablet Take 1,000 mcg by mouth daily.  . Vitamin D, Ergocalciferol, (DRISDOL) 1.25 MG (50000 UNIT) CAPS capsule TAKE 1 CAPSULE BY MOUTH EVERY 7 DAYS  . vitamin E 400 UNIT capsule Take 400 Units by mouth daily.  Marland Kitchen VITRON-C  65-125 MG TABS Take 1 tablet by mouth 2 (two) times daily.     Allergies  Allergen Reactions  . Codeine Hives and Shortness Of Breath       . Other Anaphylaxis    Nuts  . Ciprofloxacin Itching  . Valsartan Rash  . Adhesive [Tape] Itching    "clear tape" used after surgery."took all her skin off".  Paper take is ok  . Sulfa Antibiotics Nausea Only    Social History   Socioeconomic History  . Marital status: Married    Spouse name: Not on file  . Number of children: Not on file  . Years of education: Not on file  . Highest education level: Not on file  Occupational History  . Not on file  Tobacco Use  . Smoking status: Never Smoker   . Smokeless tobacco: Never Used  Vaping Use  . Vaping Use: Never used  Substance and Sexual Activity  . Alcohol use: No  . Drug use: No  . Sexual activity: Not on file  Other Topics Concern  . Not on file  Social History Narrative  . Not on file   Social Determinants of Health   Financial Resource Strain: Not on file  Food Insecurity: Not on file  Transportation Needs: Not on file  Physical Activity: Not on file  Stress: Not on file  Social Connections: Not on file  Intimate Partner Violence: Not on file     Review of Systems: General: negative for chills, fever, night sweats or weight changes.  Cardiovascular: negative for chest pain, dyspnea on exertion, edema, orthopnea, palpitations, paroxysmal nocturnal dyspnea or shortness of breath Dermatological: negative for rash Respiratory: negative for cough or wheezing Urologic: negative for hematuria Abdominal: negative for nausea, vomiting, diarrhea, bright red blood per rectum, melena, or hematemesis Neurologic: negative for visual changes, syncope, or dizziness All other systems reviewed and are otherwise negative except as noted above.    Blood pressure 124/76, pulse 100, height 5\' 5"  (1.651 m), weight 171 lb (77.6 kg).  General appearance: alert and no distress Neck: no adenopathy, no carotid bruit, no JVD, supple, symmetrical, trachea midline and thyroid not enlarged, symmetric, no tenderness/mass/nodules Lungs: clear to auscultation bilaterally Heart: regular rate and rhythm, S1, S2 normal, no murmur, click, rub or gallop Extremities: 2+ edema bilaterally Pulses: 2+ and symmetric Diminished pedal pulses Skin: Skin color, texture, turgor normal. No rashes or lesions Neurologic: Alert and oriented X 3, normal strength and tone. Normal symmetric reflexes. Normal coordination and gait  EKG not performed today  ASSESSMENT AND PLAN:   Peripheral arterial disease (HCC) [name] was referred to me by Dr. Apolinar Junes for  PAD.  She does have symptoms compatible with restless leg syndrome.  She also has CAD.  Dopplers performed 02/16/2020 showed noncompressible ABIs with occluded posterior tibial on the right, anterior tibial and posterior tibial on the left.  She had no large vessel disease.  There is no evidence of critical limb ischemia.  I am to start her on Pletal 50 mg p.o. twice daily and we will see her back in 3 to 4 months for follow-up.  There is no indication currently to perform an invasive evaluation.      14/11/2019 MD FACP,FACC,FAHA, [facility] 02/24/2020 11:15 AM

## 2020-02-28 ENCOUNTER — Encounter: Payer: Self-pay | Admitting: Physician Assistant

## 2020-02-28 ENCOUNTER — Ambulatory Visit (INDEPENDENT_AMBULATORY_CARE_PROVIDER_SITE_OTHER): Payer: Commercial Managed Care - PPO | Admitting: Physician Assistant

## 2020-02-28 ENCOUNTER — Other Ambulatory Visit: Payer: Self-pay

## 2020-02-28 VITALS — BP 128/72 | HR 61 | Ht 65.0 in | Wt 172.0 lb

## 2020-02-28 DIAGNOSIS — I200 Unstable angina: Secondary | ICD-10-CM

## 2020-02-28 DIAGNOSIS — E119 Type 2 diabetes mellitus without complications: Secondary | ICD-10-CM

## 2020-02-28 DIAGNOSIS — E785 Hyperlipidemia, unspecified: Secondary | ICD-10-CM

## 2020-02-28 DIAGNOSIS — I10 Essential (primary) hypertension: Secondary | ICD-10-CM | POA: Diagnosis not present

## 2020-02-28 DIAGNOSIS — I25119 Atherosclerotic heart disease of native coronary artery with unspecified angina pectoris: Secondary | ICD-10-CM

## 2020-02-28 DIAGNOSIS — R079 Chest pain, unspecified: Secondary | ICD-10-CM | POA: Diagnosis not present

## 2020-02-28 DIAGNOSIS — Z01810 Encounter for preprocedural cardiovascular examination: Secondary | ICD-10-CM | POA: Diagnosis not present

## 2020-02-28 NOTE — H&P (View-Only) (Signed)
Cardiology Office Note:    Date:  03/01/2020   ID:  Tamara Nunez, DOB 1955-07-21, MRN [id number]  PCP:  Theodoro KosLewis, [name], MD  [facility] Cardiologist:  Chrystie NoseKenneth C Hilty, MD  [facility] Electrophysiologist:  None   Referring MD: Theodoro KosLewis, [name], MD   Chief Complaint  Patient presents with  . Follow-up    FOLLOW-UP SWELLING IN LEGS, SHORTNESS OF BREATH, CHEST PRESSURE     History of Present Illness:    Tamara KaplanWanda O Dethlefs is a 64 y.o. female with a hx of CAD s/p CABG 2006 with LIMA to LAD, SVG to distal LAD, hypertension, hyperlipidemia, GERD, fibromyalgia and DM2.  Cardiac catheterization in 2009 showed widely patent grafts.  Myoview March 2014 was low risk.  Patient underwent repeat cardiac catheterization in July 2018 that demonstrated 80% proximal RCA lesion that was treated with DES, he also had atretic LIMA to LAD with 100% occlusion of mid LAD, patent SVG to distal LAD, EF 55 to 60%.  Unfortunately given despite intervention, she continued to have intermittent chest discomfort.  Last cardiac catheterization performed in April 2020 demonstrated a single obstructive vessel with chronic total occlusion of mid LAD that was bypassed by SVG graft.  New 90% proximal D1 that was felt to be culprit lesion, this was treated with a drug-eluting stent.  She again underwent cardiac catheterization by Dr. SwazilandJordan in June 2021 that demonstrated 75% distal RCA lesion that was treated with drug-eluting stent.  She recently presented to the ED on 02/19/2020 with chest pain.  Serial troponin was negative.  EKG shows no significant ischemia.  Patient was recently seen by Dr. Allyson SabalBerry for PAD symptoms, and was started on Pletal.  Patient presents today for evaluation of chest pain for the past several weeks.  She says her chest pain completely resolved after last PCI in June 2021.  However in the past 4944-month, her chest discomfort returned. Interestingly, she has a focal tender spot on the left side of  the chest.  Despite so, she says her chest pain has been increasing frequency and duration.  She is quite frustrated and says this is the exact same symptoms she had prior to the last PCI with regard to the location and the characteristic.  Despite her tight control of her cholesterol, she keeps having recurrence of restenosis.  I discussed the case with DOD, given the severity of her current symptom when compared to the previous angina and her surprisingly significant progression of coronary artery disease over the years, we recommended more definitive evaluation via cardiac catheterization.   Past Medical History:  Diagnosis Date  . Arthritis    "in my back" (05/06/2017)  . CAD (coronary artery disease)    CABG 2006, 09/2016 DES RCA, 04/2017 DES Circ, 06/2018 DES 1st diag  . Chronic back pain   . Coronary artery disease    a. s/p CABG in 2006 with LIMA-LAD, SVG-dLAD. b. cath in 2009 showing patent grafts. c. 09/2016: cath with occluded LAD but patent SVG-dLAD. LIMA was atretic. pRCA with 80% stenosis --> treated with DES  . Fibromyalgia   . GERD (gastroesophageal reflux disease)   . Hyperlipidemia   . Hypertension   . Type II diabetes mellitus (HCC)     Past Surgical History:  Procedure Laterality Date  . ABDOMINAL HERNIA REPAIR  2018  . BACK SURGERY    . CARDIAC CATHETERIZATION  01/13/2008  . COLONOSCOPY    . CORONARY ANGIOPLASTY WITH STENT PLACEMENT  09/26/2016  Prox RCA lesion, 80 %stenosed. DES   . CORONARY ARTERY BYPASS GRAFT  12/23/2004   2 vessel - LIMA to LAD (atretic), SVG to distal LAD  . CORONARY ATHERECTOMY N/A 08/16/2019   Procedure: CORONARY ATHERECTOMY;  Surgeon: Swaziland, Peter M, MD;  Location: [facility] INVASIVE CV LAB;  Service: Cardiovascular;  Laterality: N/A;  . CORONARY STENT INTERVENTION N/A 09/26/2016   Procedure: Coronary Stent Intervention;  Surgeon: Marykay Lex, MD;  Location: [facility] INVASIVE CV LAB;  Service: Cardiovascular;  Laterality: N/A;  . CORONARY STENT  INTERVENTION N/A 05/06/2017   Procedure: CORONARY STENT INTERVENTION;  Surgeon: Kathleene Hazel, MD;  Location: [facility] INVASIVE CV LAB;  Service: Cardiovascular;  Laterality: N/A;  . CORONARY STENT INTERVENTION N/A 06/25/2018   Procedure: CORONARY STENT INTERVENTION;  Surgeon: Swaziland, Peter M, MD;  Location: [facility] INVASIVE CV LAB;  Service: Cardiovascular;  Laterality: N/A;  . CORONARY STENT INTERVENTION N/A 08/12/2019   Procedure: CORONARY STENT INTERVENTION;  Surgeon: Lyn Records, MD;  Location: [facility] INVASIVE CV LAB;  Service: Cardiovascular;  Laterality: N/A;  . CORONARY STENT INTERVENTION N/A 08/16/2019   Procedure: CORONARY STENT INTERVENTION;  Surgeon: Swaziland, Peter M, MD;  Location: [facility] INVASIVE CV LAB;  Service: Cardiovascular;  Laterality: N/A;  . DOPPLER ECHOCARDIOGRAPHY  04/12/2008   EF 60%  . GASTRIC BYPASS  2009  . HERNIA REPAIR    . LEFT HEART CATH AND CORONARY ANGIOGRAPHY N/A 05/06/2017   Procedure: LEFT HEART CATH AND CORONARY ANGIOGRAPHY;  Surgeon: Kathleene Hazel, MD;  Location: [facility] INVASIVE CV LAB;  Service: Cardiovascular;  Laterality: N/A;  . LEFT HEART CATH AND CORS/GRAFTS ANGIOGRAPHY N/A 09/26/2016   Procedure: Left Heart Cath and Cors/Grafts Angiography;  Surgeon: Marykay Lex, MD;  Location: [facility] INVASIVE CV LAB;  Service: Cardiovascular;  Laterality: N/A;  . LEFT HEART CATH AND CORS/GRAFTS ANGIOGRAPHY N/A 06/25/2018   Procedure: LEFT HEART CATH AND CORS/GRAFTS ANGIOGRAPHY;  Surgeon: Swaziland, Peter M, MD;  Location: [facility] INVASIVE CV LAB;  Service: Cardiovascular;  Laterality: N/A;  . LEFT HEART CATH AND CORS/GRAFTS ANGIOGRAPHY N/A 08/12/2019   Procedure: LEFT HEART CATH AND CORS/GRAFTS ANGIOGRAPHY;  Surgeon: Lyn Records, MD;  Location: [facility] INVASIVE CV LAB;  Service: Cardiovascular;  Laterality: N/A;  . NM MYOVIEW LTD  06/03/2012   EF 71%  . POSTERIOR FUSION LUMBAR SPINE  1990s  . TUBAL LIGATION    . VAGINAL HYSTERECTOMY     "partial"    Current Medications: No  outpatient medications have been marked as taking for the 02/28/20 encounter (Office Visit) with Azalee Course, PA.     Allergies:   Codeine, Other, Ciprofloxacin, Valsartan, Adhesive [tape], and Sulfa antibiotics   Social History   Socioeconomic History  . Marital status: Married    Spouse name: Not on file  . Number of children: Not on file  . Years of education: Not on file  . Highest education level: Not on file  Occupational History  . Not on file  Tobacco Use  . Smoking status: Never Smoker  . Smokeless tobacco: Never Used  Vaping Use  . Vaping Use: Never used  Substance and Sexual Activity  . Alcohol use: No  . Drug use: No  . Sexual activity: Not on file  Other Topics Concern  . Not on file  Social History Narrative  . Not on file   Social Determinants of Health   Financial Resource Strain: Not on file  Food Insecurity: Not on file  Transportation Needs: Not on file  Physical Activity: Not on file  Stress: Not on file  Social Connections: Not on file     Family History: The patient's family history includes Cancer - Lung in her father; Heart disease in her mother.  ROS:   Please see the history of present illness.     All other systems reviewed and are negative.  EKGs/Labs/Other Studies Reviewed:    The following studies were reviewed today:  Cath 08/16/2019   Mid RCA to Dist RCA lesion is 75% stenosed.  Post intervention, there is a 0% residual stenosis.  A drug-eluting stent was successfully placed using a STENT RESOLUTE ONYX 2.5X34.   1. Successful PCI of the mid RCA using orbital atherectomy and DES x 1  Plan: DAPT for at least one year. Anticipate DC tomorrow.    EKG:  EKG is ordered today.  The ekg ordered today demonstrates normal sinus rhythm, right bundle branch block.  Recent Labs: 08/17/2019: ALT 26 01/11/2020: TSH 0.80 02/19/2020: BUN 12; Creatinine, Ser 0.76; Hemoglobin 10.6; Platelets 315; Potassium 4.4; Sodium 143  Recent Lipid  Panel    Component Value Date/Time   CHOL 136 01/11/2020 0000   CHOL 131 07/05/2018 1152   TRIG 61 01/11/2020 0000   HDL 83 (A) 01/11/2020 0000   HDL 83 07/05/2018 1152   CHOLHDL 1.6 07/05/2018 1152   CHOLHDL 1.8 04/15/2010 0400   VLDL 3 04/15/2010 0400   LDLCALC 41 01/11/2020 0000   LDLCALC 38 07/05/2018 1152     Risk Assessment/Calculations:       Physical Exam:    VS:  BP 128/72 (BP Location: Right Arm, Patient Position: Sitting, Cuff Size: Normal)   Pulse 61   Ht 5\' 5"  (1.651 m)   Wt 172 lb (78 kg)   BMI 28.62 kg/m     Wt Readings from Last 3 Encounters:  02/28/20 172 lb (78 kg)  02/24/20 171 lb (77.6 kg)  01/20/20 169 lb 3.2 oz (76.7 kg)     GEN:  Well nourished, well developed in no acute distress HEENT: Normal NECK: No JVD; No carotid bruits LYMPHATICS: No lymphadenopathy CARDIAC: RRR, no murmurs, rubs, gallops RESPIRATORY:  Clear to auscultation without rales, wheezing or rhonchi  ABDOMEN: Soft, non-tender, non-distended MUSCULOSKELETAL:  No edema; No deformity  SKIN: Warm and dry NEUROLOGIC:  Alert and oriented x 3 PSYCHIATRIC:  Normal affect   ASSESSMENT:    1. Chest pain of uncertain etiology   2. Pre-procedural cardiovascular examination   3. Coronary artery disease involving native coronary artery of native heart with angina pectoris (HCC)   4. Primary hypertension   5. Hyperlipidemia LDL goal <70   6. Controlled type 2 diabetes mellitus without complication, without long-term current use of insulin (HCC)    PLAN:    In order of problems listed above:  1. Chest discomfort: Per patient, this is reminiscent of the previous angina.  Her chest pain completely resolved for several months after the last PCI in June 2021, however returned in the past 2 months.  She has been compliant with her dual antiplatelet therapy.  Over the years, she had progression of coronary artery disease and multiple PCI's.  I discussed case with DOD Dr. 06-22-2000, we  eventually decided to proceed with another cardiac catheterization and definitive evaluation.    - Risk and benefit of procedure explained to the patient who display clear understanding and agree to proceed.  Discussed with patient possible procedural risk include bleeding, vascular injury, renal injury, arrythmia, MI, stroke  and loss of limb or life.  -Hold Lasix for 2 days prior to cath.  Hold Pletal for 3 days prior to cath.  2. CAD: Continue aspirin and Lipitor  3. Hypertension: Blood pressure stable  4. Hyperlipidemia: Her cholesterol has always been very well controlled, still she has surprisingly frequent progression of coronary artery disease.  May need to consider addition of PCSK9 inhibitor to further lower the LDL  5. DM2: Managed by primary care provider.   Shared Decision Making/Informed Consent The risks [stroke (1 in 1000), death (1 in 1000), kidney failure [usually temporary] (1 in 500), bleeding (1 in 200), allergic reaction [possibly serious] (1 in 200)], benefits (diagnostic support and management of coronary artery disease) and alternatives of a cardiac catheterization were discussed in detail with Ms. Bowley and she is willing to proceed.        Medication Adjustments/Labs and Tests Ordered: Current medicines are reviewed at length with the patient today.  Concerns regarding medicines are outlined above.  Orders Placed This Encounter  Procedures  . Basic metabolic panel  . CBC  . EKG 12-Lead   No orders of the defined types were placed in this encounter.   Patient Instructions  Medication Instructions:  Your physician recommends that you continue on your current medications as directed. Please refer to the Current Medication list given to you today.  *If you need a refill on your cardiac medications before your next appointment, please call your pharmacy*  Lab Work: You will need a COVID-19  test prior to your procedure. You are scheduled for Monday  03/05/2020 at 1:55 PM. This is a Drive Up Visit at [address] [place], [place] [address]. Someone will direct you to the appropriate testing line. Stay in your car and someone will be with you shortly.  Your physician recommends that you return for lab work on Monday 03/05/2020:   BMET  CBC  If you have labs (blood work) drawn today and your tests are completely normal, you will receive your results only by: . MyChart Message (if you have MyChart) OR . A paper copy in the mail If you have any lab test that is abnormal or we need to change your treatment, we will call you to review the results.  Testing/Procedures: Your physician has requested that you have a cardiac catheterization. Cardiac catheterization is used to diagnose and/or treat various heart conditions. Doctors may recommend this procedure for a number of different reasons. The most common reason is to evaluate chest pain. Chest pain can be a symptom of coronary artery disease (CAD), and cardiac catheterization can show whether plaque is narrowing or blocking your heart's arteries. This procedure is also used to evaluate the valves, as well as measure the blood flow and oxygen levels in different parts of your heart. For further information please visit www.[email or web address]. Please follow instruction sheet, as given.  Scheduled for Thursday 03/08/2020 at 7:30 AM with a 5:30 AM arrival    Follow-Up: At [facility], you and your health needs are our priority.  As part of our continuing mission to provide you with exceptional heart care, we have created designated Provider Care Teams.  These Care Teams include your primary Cardiologist (physician) and Advanced Practice Providers (APPs -  Physician Assistants and Nurse Practitioners) who all work together to provide you with the care you need, when you need it.  We recommend signing up for the patient portal called "MyChart".  Sign up information is provided on this After   Visit  Summary.  MyChart is used to connect with patients for Virtual Visits (Telemedicine).  Patients are able to view lab/test results, encounter notes, upcoming appointments, etc.  Non-urgent messages can be sent to your provider as well.   To learn more about what you can do with MyChart, go to [email or web address].    Your next appointment:   Follow up 2 weeks after Heart Catheterization   The format for your next appointment:   In Person  Provider:   K. Italy Hilty, MD or Azalee Course, PA-C   Other Instructions          [facility] CARDIOVASCULAR DIVISION [facility] [address] [place] [address] Dept: [phone] Loc: [phone]  AAIRA OESTREICHER  02/28/2020  You are scheduled for a Cardiac Catheterization on Thursday, December 30 with Dr. Peter Swaziland.  1. Please arrive at the [facility] (Main Entrance A) at [facility]: [address] [place], [place] [address] at 5:30 AM (This time is two hours before your procedure to ensure your preparation). Free valet parking service is available.   Special note: Every effort is made to have your procedure done on time. Please understand that emergencies sometimes delay scheduled procedures.  2. Diet: Do not eat solid foods after midnight.  The patient may have clear liquids until 5am upon the day of the procedure.  3. Labs: You will need to have blood drawn on Monday, December 27 at [facility] [address] [address], [place]  Open: 8am - 5pm (Lunch 12:30 - 1:30)   Phone: [phone]. You do not need to be fasting.  4. Medication instructions in preparation for your procedure:   Contrast Allergy: No  Stop taking, Lasix (Furosemide)  Wednesday, December 29,  Hold Cilostazol (Pletal) for 3 days Monday December 27,  On the morning of your procedure, take your Aspirin and Brilinta/Ticagrelor and any morning medicines NOT listed above.  You may use sips of  water.  5. Plan for one night stay--bring personal belongings. 6. Bring a current list of your medications and current insurance cards. 7. You MUST have a responsible person to drive you home. 8. Someone MUST be with you the first 24 hours after you arrive home or your discharge will be delayed. 9. Please wear clothes that are easy to get on and off and wear slip-on shoes.  Thank you for allowing [place] to care for you!   -- [facility] Invasive Cardiovascular services      Signed, Azalee Course, [place]  03/01/2020 9:57 PM    [facility]

## 2020-02-28 NOTE — Patient Instructions (Addendum)
Medication Instructions:  Your physician recommends that you continue on your current medications as directed. Please refer to the Current Medication list given to you today.  *If you need a refill on your cardiac medications before your next appointment, please call your pharmacy*  Lab Work: You will need a COVID-19  test prior to your procedure. You are scheduled for Monday 03/05/2020 at 1:55 PM. This is a Drive Up Visit at [address] [place], [place] [address]. Someone will direct you to the appropriate testing line. Stay in your car and someone will be with you shortly.  Your physician recommends that you return for lab work on Monday 03/05/2020:   BMET  CBC  If you have labs (blood work) drawn today and your tests are completely normal, you will receive your results only by: Marland Kitchen MyChart Message (if you have MyChart) OR . A paper copy in the mail If you have any lab test that is abnormal or we need to change your treatment, we will call you to review the results.  Testing/Procedures: Your physician has requested that you have a cardiac catheterization. Cardiac catheterization is used to diagnose and/or treat various heart conditions. Doctors may recommend this procedure for a number of different reasons. The most common reason is to evaluate chest pain. Chest pain can be a symptom of coronary artery disease (CAD), and cardiac catheterization can show whether plaque is narrowing or blocking your heart's arteries. This procedure is also used to evaluate the valves, as well as measure the blood flow and oxygen levels in different parts of your heart. For further information please visit [email or web address] Please follow instruction sheet, as given.  Scheduled for Thursday 03/08/2020 at 7:30 AM with a 5:30 AM arrival    Follow-Up: At [facility], you and your health needs are our priority.  As part of our continuing mission to provide you with exceptional heart care, we have created  designated Provider Care Teams.  These Care Teams include your primary Cardiologist (physician) and Advanced Practice Providers (APPs -  Physician Assistants and Nurse Practitioners) who all work together to provide you with the care you need, when you need it.  We recommend signing up for the patient portal called "MyChart".  Sign up information is provided on this After Visit Summary.  MyChart is used to connect with patients for Virtual Visits (Telemedicine).  Patients are able to view lab/test results, encounter notes, upcoming appointments, etc.  Non-urgent messages can be sent to your provider as well.   To learn more about what you can do with MyChart, go to [email or web address].    Your next appointment:   Follow up 2 weeks after Heart Catheterization   The format for your next appointment:   In Person  Provider:   K. Italy Hilty, MD or Azalee Course, PA-C   Other Instructions          [facility] GROUP [facility] CARDIOVASCULAR DIVISION [facility] [address] [place] [address] [place] [address] Dept: [phone] Loc: [phone]  Tamara Nunez  02/28/2020  You are scheduled for a Cardiac Catheterization on Thursday, December 30 with Dr. Peter Swaziland.  1. Please arrive at the [facility] (Main Entrance A) at [facility]: [address] [place], [place] [address] at 5:30 AM (This time is two hours before your procedure to ensure your preparation). Free valet parking service is available.   Special note: Every effort is made to have your procedure done on time. Please  understand that emergencies sometimes delay scheduled procedures.  2. Diet: Do not eat solid foods after midnight.  The patient may have clear liquids until 5am upon the day of the procedure.  3. Labs: You will need to have blood drawn on Monday, December 27 at [facility] [address] [facility] [address] [facility] [address] [facility] [address], [place]  Open: 8am - 5pm (Lunch 12:30 - 1:30)   Phone: [phone]. You  do not need to be fasting.  4. Medication instructions in preparation for your procedure:   Contrast Allergy: No  Stop taking, Lasix (Furosemide)  Wednesday, December 29,  Hold Cilostazol (Pletal) for 3 days Monday December 27,  On the morning of your procedure, take your Aspirin and Brilinta/Ticagrelor and any morning medicines NOT listed above.  You may use sips of water.  5. Plan for one night stay--bring personal belongings. 6. Bring a current list of your medications and current insurance cards. 7. You MUST have a responsible person to drive you home. 8. Someone MUST be with you the first 24 hours after you arrive home or your discharge will be delayed. 9. Please wear clothes that are easy to get on and off and wear slip-on shoes.  Thank you for allowing [place] to care for you!   -- [place] Invasive Cardiovascular services

## 2020-02-28 NOTE — Progress Notes (Addendum)
Cardiology Office Note:    Date:  03/01/2020   ID:  Tamara Nunez, DOB 1955-07-21, MRN [id number]  PCP:  Theodoro KosLewis, [name], MD  [facility] Cardiologist:  Chrystie NoseKenneth C Hilty, MD  [facility] Electrophysiologist:  None   Referring MD: Theodoro KosLewis, [name], MD   Chief Complaint  Patient presents with  . Follow-up    FOLLOW-UP SWELLING IN LEGS, SHORTNESS OF BREATH, CHEST PRESSURE     History of Present Illness:    Tamara KaplanWanda O Nunez is a 64 y.o. female with a hx of CAD s/p CABG 2006 with LIMA to LAD, SVG to distal LAD, hypertension, hyperlipidemia, GERD, fibromyalgia and DM2.  Cardiac catheterization in 2009 showed widely patent grafts.  Myoview March 2014 was low risk.  Patient underwent repeat cardiac catheterization in July 2018 that demonstrated 80% proximal RCA lesion that was treated with DES, he also had atretic LIMA to LAD with 100% occlusion of mid LAD, patent SVG to distal LAD, EF 55 to 60%.  Unfortunately given despite intervention, she continued to have intermittent chest discomfort.  Last cardiac catheterization performed in April 2020 demonstrated a single obstructive vessel with chronic total occlusion of mid LAD that was bypassed by SVG graft.  New 90% proximal D1 that was felt to be culprit lesion, this was treated with a drug-eluting stent.  She again underwent cardiac catheterization by Dr. SwazilandJordan in June 2021 that demonstrated 75% distal RCA lesion that was treated with drug-eluting stent.  She recently presented to the ED on 02/19/2020 with chest pain.  Serial troponin was negative.  EKG shows no significant ischemia.  Patient was recently seen by Dr. Allyson SabalBerry for PAD symptoms, and was started on Pletal.  Patient presents today for evaluation of chest pain for the past several weeks.  She says her chest pain completely resolved after last PCI in June 2021.  However in the past 4944-month, her chest discomfort returned. Interestingly, she has a focal tender spot on the left side of  the chest.  Despite so, she says her chest pain has been increasing frequency and duration.  She is quite frustrated and says this is the exact same symptoms she had prior to the last PCI with regard to the location and the characteristic.  Despite her tight control of her cholesterol, she keeps having recurrence of restenosis.  I discussed the case with DOD, given the severity of her current symptom when compared to the previous angina and her surprisingly significant progression of coronary artery disease over the years, we recommended more definitive evaluation via cardiac catheterization.   Past Medical History:  Diagnosis Date  . Arthritis    "in my back" (05/06/2017)  . CAD (coronary artery disease)    CABG 2006, 09/2016 DES RCA, 04/2017 DES Circ, 06/2018 DES 1st diag  . Chronic back pain   . Coronary artery disease    a. s/p CABG in 2006 with LIMA-LAD, SVG-dLAD. b. cath in 2009 showing patent grafts. c. 09/2016: cath with occluded LAD but patent SVG-dLAD. LIMA was atretic. pRCA with 80% stenosis --> treated with DES  . Fibromyalgia   . GERD (gastroesophageal reflux disease)   . Hyperlipidemia   . Hypertension   . Type II diabetes mellitus (HCC)     Past Surgical History:  Procedure Laterality Date  . ABDOMINAL HERNIA REPAIR  2018  . BACK SURGERY    . CARDIAC CATHETERIZATION  01/13/2008  . COLONOSCOPY    . CORONARY ANGIOPLASTY WITH STENT PLACEMENT  09/26/2016  Prox RCA lesion, 80 %stenosed. DES   . CORONARY ARTERY BYPASS GRAFT  12/23/2004   2 vessel - LIMA to LAD (atretic), SVG to distal LAD  . CORONARY ATHERECTOMY N/A 08/16/2019   Procedure: CORONARY ATHERECTOMY;  Surgeon: Swaziland, Peter M, MD;  Location: [facility] INVASIVE CV LAB;  Service: Cardiovascular;  Laterality: N/A;  . CORONARY STENT INTERVENTION N/A 09/26/2016   Procedure: Coronary Stent Intervention;  Surgeon: Marykay Lex, MD;  Location: [facility] INVASIVE CV LAB;  Service: Cardiovascular;  Laterality: N/A;  . CORONARY STENT  INTERVENTION N/A 05/06/2017   Procedure: CORONARY STENT INTERVENTION;  Surgeon: Kathleene Hazel, MD;  Location: [facility] INVASIVE CV LAB;  Service: Cardiovascular;  Laterality: N/A;  . CORONARY STENT INTERVENTION N/A 06/25/2018   Procedure: CORONARY STENT INTERVENTION;  Surgeon: Swaziland, Peter M, MD;  Location: [facility] INVASIVE CV LAB;  Service: Cardiovascular;  Laterality: N/A;  . CORONARY STENT INTERVENTION N/A 08/12/2019   Procedure: CORONARY STENT INTERVENTION;  Surgeon: Lyn Records, MD;  Location: [facility] INVASIVE CV LAB;  Service: Cardiovascular;  Laterality: N/A;  . CORONARY STENT INTERVENTION N/A 08/16/2019   Procedure: CORONARY STENT INTERVENTION;  Surgeon: Swaziland, Peter M, MD;  Location: [facility] INVASIVE CV LAB;  Service: Cardiovascular;  Laterality: N/A;  . DOPPLER ECHOCARDIOGRAPHY  04/12/2008   EF 60%  . GASTRIC BYPASS  2009  . HERNIA REPAIR    . LEFT HEART CATH AND CORONARY ANGIOGRAPHY N/A 05/06/2017   Procedure: LEFT HEART CATH AND CORONARY ANGIOGRAPHY;  Surgeon: Kathleene Hazel, MD;  Location: [facility] INVASIVE CV LAB;  Service: Cardiovascular;  Laterality: N/A;  . LEFT HEART CATH AND CORS/GRAFTS ANGIOGRAPHY N/A 09/26/2016   Procedure: Left Heart Cath and Cors/Grafts Angiography;  Surgeon: Marykay Lex, MD;  Location: [facility] INVASIVE CV LAB;  Service: Cardiovascular;  Laterality: N/A;  . LEFT HEART CATH AND CORS/GRAFTS ANGIOGRAPHY N/A 06/25/2018   Procedure: LEFT HEART CATH AND CORS/GRAFTS ANGIOGRAPHY;  Surgeon: Swaziland, Peter M, MD;  Location: [facility] INVASIVE CV LAB;  Service: Cardiovascular;  Laterality: N/A;  . LEFT HEART CATH AND CORS/GRAFTS ANGIOGRAPHY N/A 08/12/2019   Procedure: LEFT HEART CATH AND CORS/GRAFTS ANGIOGRAPHY;  Surgeon: Lyn Records, MD;  Location: [facility] INVASIVE CV LAB;  Service: Cardiovascular;  Laterality: N/A;  . NM MYOVIEW LTD  06/03/2012   EF 71%  . POSTERIOR FUSION LUMBAR SPINE  1990s  . TUBAL LIGATION    . VAGINAL HYSTERECTOMY     "partial"    Current Medications: No  outpatient medications have been marked as taking for the 02/28/20 encounter (Office Visit) with Azalee Course, PA.     Allergies:   Codeine, Other, Ciprofloxacin, Valsartan, Adhesive [tape], and Sulfa antibiotics   Social History   Socioeconomic History  . Marital status: Married    Spouse name: Not on file  . Number of children: Not on file  . Years of education: Not on file  . Highest education level: Not on file  Occupational History  . Not on file  Tobacco Use  . Smoking status: Never Smoker  . Smokeless tobacco: Never Used  Vaping Use  . Vaping Use: Never used  Substance and Sexual Activity  . Alcohol use: No  . Drug use: No  . Sexual activity: Not on file  Other Topics Concern  . Not on file  Social History Narrative  . Not on file   Social Determinants of Health   Financial Resource Strain: Not on file  Food Insecurity: Not on file  Transportation Needs: Not on file  Physical Activity: Not on file  Stress: Not on file  Social Connections: Not on file     Family History: The patient's family history includes Cancer - Lung in her father; Heart disease in her mother.  ROS:   Please see the history of present illness.     All other systems reviewed and are negative.  EKGs/Labs/Other Studies Reviewed:    The following studies were reviewed today:  Cath 08/16/2019   Mid RCA to Dist RCA lesion is 75% stenosed.  Post intervention, there is a 0% residual stenosis.  A drug-eluting stent was successfully placed using a STENT RESOLUTE ONYX 2.5X34.   1. Successful PCI of the mid RCA using orbital atherectomy and DES x 1  Plan: DAPT for at least one year. Anticipate DC tomorrow.    EKG:  EKG is ordered today.  The ekg ordered today demonstrates normal sinus rhythm, right bundle branch block.  Recent Labs: 08/17/2019: ALT 26 01/11/2020: TSH 0.80 02/19/2020: BUN 12; Creatinine, Ser 0.76; Hemoglobin 10.6; Platelets 315; Potassium 4.4; Sodium 143  Recent Lipid  Panel    Component Value Date/Time   CHOL 136 01/11/2020 0000   CHOL 131 07/05/2018 1152   TRIG 61 01/11/2020 0000   HDL 83 (A) 01/11/2020 0000   HDL 83 07/05/2018 1152   CHOLHDL 1.6 07/05/2018 1152   CHOLHDL 1.8 04/15/2010 0400   VLDL 3 04/15/2010 0400   LDLCALC 41 01/11/2020 0000   LDLCALC 38 07/05/2018 1152     Risk Assessment/Calculations:       Physical Exam:    VS:  BP 128/72 (BP Location: Right Arm, Patient Position: Sitting, Cuff Size: Normal)   Pulse 61   Ht 5\' 5"  (1.651 m)   Wt 172 lb (78 kg)   BMI 28.62 kg/m     Wt Readings from Last 3 Encounters:  02/28/20 172 lb (78 kg)  02/24/20 171 lb (77.6 kg)  01/20/20 169 lb 3.2 oz (76.7 kg)     GEN:  Well nourished, well developed in no acute distress HEENT: Normal NECK: No JVD; No carotid bruits LYMPHATICS: No lymphadenopathy CARDIAC: RRR, no murmurs, rubs, gallops RESPIRATORY:  Clear to auscultation without rales, wheezing or rhonchi  ABDOMEN: Soft, non-tender, non-distended MUSCULOSKELETAL:  No edema; No deformity  SKIN: Warm and dry NEUROLOGIC:  Alert and oriented x 3 PSYCHIATRIC:  Normal affect   ASSESSMENT:    1. Chest pain of uncertain etiology   2. Pre-procedural cardiovascular examination   3. Coronary artery disease involving native coronary artery of native heart with angina pectoris (HCC)   4. Primary hypertension   5. Hyperlipidemia LDL goal <70   6. Controlled type 2 diabetes mellitus without complication, without long-term current use of insulin (HCC)    PLAN:    In order of problems listed above:  1. Chest discomfort: Per patient, this is reminiscent of the previous angina.  Her chest pain completely resolved for several months after the last PCI in June 2021, however returned in the past 2 months.  She has been compliant with her dual antiplatelet therapy.  Over the years, she had progression of coronary artery disease and multiple PCI's.  I discussed case with DOD Dr. 06-22-2000, we  eventually decided to proceed with another cardiac catheterization and definitive evaluation.    - Risk and benefit of procedure explained to the patient who display clear understanding and agree to proceed.  Discussed with patient possible procedural risk include bleeding, vascular injury, renal injury, arrythmia, MI, stroke  and loss of limb or life.  -Hold Lasix for 2 days prior to cath.  Hold Pletal for 3 days prior to cath.  2. CAD: Continue aspirin and Lipitor  3. Hypertension: Blood pressure stable  4. Hyperlipidemia: Her cholesterol has always been very well controlled, still she has surprisingly frequent progression of coronary artery disease.  May need to consider addition of PCSK9 inhibitor to further lower the LDL  5. DM2: Managed by primary care provider.   Shared Decision Making/Informed Consent The risks [stroke (1 in 1000), death (1 in 1000), kidney failure [usually temporary] (1 in 500), bleeding (1 in 200), allergic reaction [possibly serious] (1 in 200)], benefits (diagnostic support and management of coronary artery disease) and alternatives of a cardiac catheterization were discussed in detail with Tamara Nunez and she is willing to proceed.        Medication Adjustments/Labs and Tests Ordered: Current medicines are reviewed at length with the patient today.  Concerns regarding medicines are outlined above.  Orders Placed This Encounter  Procedures  . Basic metabolic panel  . CBC  . EKG 12-Lead   No orders of the defined types were placed in this encounter.   Patient Instructions  Medication Instructions:  Your physician recommends that you continue on your current medications as directed. Please refer to the Current Medication list given to you today.  *If you need a refill on your cardiac medications before your next appointment, please call your pharmacy*  Lab Work: You will need a COVID-19  test prior to your procedure. You are scheduled for Monday  03/05/2020 at 1:55 PM. This is a Drive Up Visit at [address] [place], [place] [address]. Someone will direct you to the appropriate testing line. Stay in your car and someone will be with you shortly.  Your physician recommends that you return for lab work on Monday 03/05/2020:   BMET  CBC  If you have labs (blood work) drawn today and your tests are completely normal, you will receive your results only by: Marland Kitchen MyChart Message (if you have MyChart) OR . A paper copy in the mail If you have any lab test that is abnormal or we need to change your treatment, we will call you to review the results.  Testing/Procedures: Your physician has requested that you have a cardiac catheterization. Cardiac catheterization is used to diagnose and/or treat various heart conditions. Doctors may recommend this procedure for a number of different reasons. The most common reason is to evaluate chest pain. Chest pain can be a symptom of coronary artery disease (CAD), and cardiac catheterization can show whether plaque is narrowing or blocking your heart's arteries. This procedure is also used to evaluate the valves, as well as measure the blood flow and oxygen levels in different parts of your heart. For further information please visit [email or web address] Please follow instruction sheet, as given.  Scheduled for Thursday 03/08/2020 at 7:30 AM with a 5:30 AM arrival    Follow-Up: At [facility], you and your health needs are our priority.  As part of our continuing mission to provide you with exceptional heart care, we have created designated Provider Care Teams.  These Care Teams include your primary Cardiologist (physician) and Advanced Practice Providers (APPs -  Physician Assistants and Nurse Practitioners) who all work together to provide you with the care you need, when you need it.  We recommend signing up for the patient portal called "MyChart".  Sign up information is provided on this After  Visit  Summary.  MyChart is used to connect with patients for Virtual Visits (Telemedicine).  Patients are able to view lab/test results, encounter notes, upcoming appointments, etc.  Non-urgent messages can be sent to your provider as well.   To learn more about what you can do with MyChart, go to [email or web address].    Your next appointment:   Follow up 2 weeks after Heart Catheterization   The format for your next appointment:   In Person  Provider:   K. Italy Hilty, MD or Azalee Course, PA-C   Other Instructions          [facility] CARDIOVASCULAR DIVISION [facility] [address] [place] [address] Dept: [phone] Loc: [phone]  Tamara Nunez  02/28/2020  You are scheduled for a Cardiac Catheterization on Thursday, December 30 with Dr. Peter Swaziland.  1. Please arrive at the [facility] (Main Entrance A) at [facility]: [address] [place], [place] [address] at 5:30 AM (This time is two hours before your procedure to ensure your preparation). Free valet parking service is available.   Special note: Every effort is made to have your procedure done on time. Please understand that emergencies sometimes delay scheduled procedures.  2. Diet: Do not eat solid foods after midnight.  The patient may have clear liquids until 5am upon the day of the procedure.  3. Labs: You will need to have blood drawn on Monday, December 27 at [facility] [address] [facility], [place]  Open: 8am - 5pm (Lunch 12:30 - 1:30)   Phone: [phone]. You do not need to be fasting.  4. Medication instructions in preparation for your procedure:   Contrast Allergy: No  Stop taking, Lasix (Furosemide)  Wednesday, December 29,  Hold Cilostazol (Pletal) for 3 days Monday December 27,  On the morning of your procedure, take your Aspirin and Brilinta/Ticagrelor and any morning medicines NOT listed above.  You may use sips of  water.  5. Plan for one night stay--bring personal belongings. 6. Bring a current list of your medications and current insurance cards. 7. You MUST have a responsible person to drive you home. 8. Someone MUST be with you the first 24 hours after you arrive home or your discharge will be delayed. 9. Please wear clothes that are easy to get on and off and wear slip-on shoes.  Thank you for allowing [place] to care for you!   -- [facility] Invasive Cardiovascular services      Signed, Azalee Course, [place]  03/01/2020 9:57 PM    [facility]

## 2020-03-01 ENCOUNTER — Other Ambulatory Visit: Payer: Self-pay | Admitting: Physician Assistant

## 2020-03-01 MED ORDER — SODIUM CHLORIDE 0.9% FLUSH: 3.0000 mL | Freq: Two times a day (BID) | INTRAVENOUS | Status: AC

## 2020-03-05 ENCOUNTER — Other Ambulatory Visit (HOSPITAL_COMMUNITY)
Admission: RE | Admit: 2020-03-05 | Discharge: 2020-03-05 | Disposition: A | Discharge status: Home / Self Care | Payer: Commercial Managed Care - PPO | Source: Ambulatory Visit | Attending: Cardiology | Admitting: Cardiology

## 2020-03-05 DIAGNOSIS — Z01812 Encounter for preprocedural laboratory examination: Secondary | ICD-10-CM | POA: Insufficient documentation

## 2020-03-05 DIAGNOSIS — Z20822 Contact with and (suspected) exposure to covid-19: Secondary | ICD-10-CM | POA: Insufficient documentation

## 2020-03-05 LAB — CBC
Hematocrit: 35.2 % (ref 34.0–46.6)
Hemoglobin: 11.3 g/dL (ref 11.1–15.9)
MCH: 26.9 pg (ref 26.6–33.0)
MCHC: 32.1 g/dL (ref 31.5–35.7)
MCV: 84 fL (ref 79–97)
Platelets: 407 10*3/uL (ref 150–450)
RBC: 4.2 x10E6/uL (ref 3.77–5.28)
RDW: 15.8 % — ABNORMAL HIGH (ref 11.7–15.4)
WBC: 7.2 10*3/uL (ref 3.4–10.8)

## 2020-03-05 LAB — BASIC METABOLIC PANEL
BUN/Creatinine Ratio: 20 (ref 12–28)
BUN: 17 mg/dL (ref 8–27)
CO2: 27 mmol/L (ref 20–29)
Calcium: 9.5 mg/dL (ref 8.7–10.3)
Chloride: 100 mmol/L (ref 96–106)
Creatinine, Ser: 0.86 mg/dL (ref 0.57–1.00)
GFR calc Af Amer: 82 mL/min/{1.73_m2} (ref >59)
GFR calc non Af Amer: 71 mL/min/{1.73_m2} (ref >59)
Glucose: 79 mg/dL (ref 65–99)
Potassium: 4.5 mmol/L (ref 3.5–5.2)
Sodium: 139 mmol/L (ref 134–144)

## 2020-03-06 LAB — SARS CORONAVIRUS 2 (TAT 6-24 HRS): SARS Coronavirus 2: NEGATIVE

## 2020-03-07 NOTE — Progress Notes (Signed)
Pt contacted pre-catheterization scheduled at [facility] for: Verified arrival time and place: [facility] Main Entrance A [facility]) at:   No solid food after midnight prior to cath, clear liquids until 5 AM day of procedure. CONTRAST ALLERGY: NO  AM meds can be  taken pre-cath with sips of water including: ASA 81 mg & Brilinta 90mg    Confirmed patient has responsible adult to drive home post procedure and be with patient first 24 hours after arriving home:  You are allowed ONE visitor in the waiting room during the time you are at the hospital for your procedure. Both you and your visitor must wear a mask once you enter the hospital.       COVID-19 Pre-Screening Questions:  . In the past 14 days have you had any symptoms concerning for COVID-19 infection (fever, chills, cough, or new shortness of breath)? No . In the past 14 days have you been around anyone with known Covid 19? No

## 2020-03-08 ENCOUNTER — Other Ambulatory Visit: Payer: Self-pay

## 2020-03-08 ENCOUNTER — Ambulatory Visit (HOSPITAL_COMMUNITY)
Admission: RE | Admit: 2020-03-08 | Discharge: 2020-03-08 | Disposition: A | Discharge status: Home / Self Care | Payer: Commercial Managed Care - PPO | Attending: Cardiology | Admitting: Cardiology

## 2020-03-08 ENCOUNTER — Encounter (HOSPITAL_COMMUNITY): Payer: Self-pay | Admitting: Cardiology

## 2020-03-08 ENCOUNTER — Encounter (HOSPITAL_COMMUNITY)
Admission: RE | Disposition: A | Discharge status: Home / Self Care | Payer: Self-pay | Source: Home / Self Care | Attending: Cardiology

## 2020-03-08 DIAGNOSIS — E785 Hyperlipidemia, unspecified: Secondary | ICD-10-CM | POA: Diagnosis not present

## 2020-03-08 DIAGNOSIS — Z888 Allergy status to other drugs, medicaments and biological substances status: Secondary | ICD-10-CM | POA: Diagnosis not present

## 2020-03-08 DIAGNOSIS — I10 Essential (primary) hypertension: Secondary | ICD-10-CM | POA: Diagnosis present

## 2020-03-08 DIAGNOSIS — Z881 Allergy status to other antibiotic agents status: Secondary | ICD-10-CM | POA: Diagnosis not present

## 2020-03-08 DIAGNOSIS — E1159 Type 2 diabetes mellitus with other circulatory complications: Secondary | ICD-10-CM | POA: Diagnosis present

## 2020-03-08 DIAGNOSIS — Z951 Presence of aortocoronary bypass graft: Secondary | ICD-10-CM | POA: Insufficient documentation

## 2020-03-08 DIAGNOSIS — I25719 Atherosclerosis of autologous vein coronary artery bypass graft(s) with unspecified angina pectoris: Secondary | ICD-10-CM | POA: Diagnosis not present

## 2020-03-08 DIAGNOSIS — E119 Type 2 diabetes mellitus without complications: Secondary | ICD-10-CM | POA: Diagnosis not present

## 2020-03-08 DIAGNOSIS — I25700 Atherosclerosis of coronary artery bypass graft(s), unspecified, with unstable angina pectoris: Secondary | ICD-10-CM | POA: Diagnosis present

## 2020-03-08 DIAGNOSIS — I200 Unstable angina: Secondary | ICD-10-CM | POA: Diagnosis present

## 2020-03-08 DIAGNOSIS — I25119 Atherosclerotic heart disease of native coronary artery with unspecified angina pectoris: Secondary | ICD-10-CM | POA: Insufficient documentation

## 2020-03-08 DIAGNOSIS — Z885 Allergy status to narcotic agent status: Secondary | ICD-10-CM | POA: Diagnosis not present

## 2020-03-08 DIAGNOSIS — I739 Peripheral vascular disease, unspecified: Secondary | ICD-10-CM | POA: Diagnosis present

## 2020-03-08 DIAGNOSIS — Z882 Allergy status to sulfonamides status: Secondary | ICD-10-CM | POA: Insufficient documentation

## 2020-03-08 LAB — GLUCOSE, CAPILLARY: Glucose-Capillary: 76 mg/dL (ref 70–99)

## 2020-03-08 SURGERY — LEFT HEART CATH AND CORS/GRAFTS ANGIOGRAPHY
Anesthesia: LOCAL

## 2020-03-08 MED ORDER — HEPARIN (PORCINE) IN NACL 1000-0.9 UT/500ML-% IV SOLN
INTRAVENOUS | Status: DC | PRN
  Administered 2020-03-08 (×2): 500 mL via INTRA_ARTERIAL

## 2020-03-08 MED ORDER — IOHEXOL 350 MG/ML SOLN
INTRAVENOUS | Status: DC | PRN
  Administered 2020-03-08: 70 mL

## 2020-03-08 MED ORDER — LIDOCAINE HCL (PF) 1 % IJ SOLN
INTRAMUSCULAR | Status: DC | PRN
  Administered 2020-03-08: 10 mL via SUBCUTANEOUS

## 2020-03-08 MED ORDER — LIDOCAINE HCL (PF) 1 % IJ SOLN
INTRAMUSCULAR | Status: AC
  Filled 2020-03-08: qty 30

## 2020-03-08 MED ORDER — SODIUM CHLORIDE 0.9% FLUSH: 3.0000 mL | Freq: Two times a day (BID) | INTRAVENOUS | Status: DC

## 2020-03-08 MED ORDER — FENTANYL CITRATE (PF) 100 MCG/2ML IJ SOLN
INTRAMUSCULAR | Status: DC | PRN
  Administered 2020-03-08 (×2): 25 ug via INTRAVENOUS

## 2020-03-08 MED ORDER — SODIUM CHLORIDE 0.9% FLUSH: 3.0000 mL | INTRAVENOUS | Status: DC | PRN

## 2020-03-08 MED ORDER — SODIUM CHLORIDE 0.9 % WEIGHT BASED INFUSION
3.0000 mL/kg/h | INTRAVENOUS | Status: AC
Start: 2020-03-08 — End: 2020-03-08
  Administered 2020-03-08: 3 mL/kg/h via INTRAVENOUS

## 2020-03-08 MED ORDER — MIDAZOLAM HCL 2 MG/2ML IJ SOLN
INTRAMUSCULAR | Status: DC | PRN
  Administered 2020-03-08 (×2): 1 mg via INTRAVENOUS

## 2020-03-08 MED ORDER — SODIUM CHLORIDE 0.9 % WEIGHT BASED INFUSION: 1.0000 mL/kg/h | INTRAVENOUS | Status: AC

## 2020-03-08 MED ORDER — ONDANSETRON HCL 4 MG/2ML IJ SOLN: 4.0000 mg | Freq: Four times a day (QID) | INTRAMUSCULAR | Status: DC | PRN

## 2020-03-08 MED ORDER — HEPARIN (PORCINE) IN NACL 1000-0.9 UT/500ML-% IV SOLN
INTRAVENOUS | Status: AC
  Filled 2020-03-08: qty 1000

## 2020-03-08 MED ORDER — ASPIRIN 81 MG PO CHEW: 81.0000 mg | CHEWABLE_TABLET | ORAL | Status: DC

## 2020-03-08 MED ORDER — MIDAZOLAM HCL 2 MG/2ML IJ SOLN
INTRAMUSCULAR | Status: AC
  Filled 2020-03-08: qty 2

## 2020-03-08 MED ORDER — SODIUM CHLORIDE 0.9 % IV SOLN: 250.0000 mL | INTRAVENOUS | Status: DC | PRN

## 2020-03-08 MED ORDER — SODIUM CHLORIDE 0.9 % WEIGHT BASED INFUSION: 1.0000 mL/kg/h | INTRAVENOUS | Status: DC

## 2020-03-08 MED ORDER — FENTANYL CITRATE (PF) 100 MCG/2ML IJ SOLN
INTRAMUSCULAR | Status: AC
  Filled 2020-03-08: qty 2

## 2020-03-08 MED ORDER — ACETAMINOPHEN 325 MG PO TABS
650.0000 mg | ORAL_TABLET | ORAL | Status: DC | PRN
  Administered 2020-03-08: 650 mg via ORAL
  Filled 2020-03-08: qty 2

## 2020-03-08 SURGICAL SUPPLY — 10 items
CATH INFINITI 5FR MULTPACK ANG (CATHETERS) ×1 IMPLANT
CLOSURE MYNX CONTROL 5F (Vascular Products) ×1 IMPLANT
ELECT DEFIB PAD ADLT CADENCE (PAD) ×1 IMPLANT
KIT HEART LEFT (KITS) ×2 IMPLANT
PACK CARDIAC CATHETERIZATION (CUSTOM PROCEDURE TRAY) ×2 IMPLANT
SHEATH PINNACLE 5F 10CM (SHEATH) ×1 IMPLANT
SHEATH PROBE COVER 6X72 (BAG) ×2 IMPLANT
TRANSDUCER W/STOPCOCK (MISCELLANEOUS) ×2 IMPLANT
TUBING CIL FLEX 10 FLL-RA (TUBING) ×2 IMPLANT
WIRE EMERALD 3MM-J .035X150CM (WIRE) ×1 IMPLANT

## 2020-03-08 NOTE — Interval H&P Note (Signed)
History and Physical Interval Note:  03/08/2020 7:17 AM  Tamara Nunez  has presented today for surgery, with the diagnosis of chest pain.  The various methods of treatment have been discussed with the patient and family. After consideration of risks, benefits and other options for treatment, the patient has consented to  Procedure(s): LEFT HEART CATH AND CORS/GRAFTS ANGIOGRAPHY (N/A) as a surgical intervention.  The patient's history has been reviewed, patient examined, no change in status, stable for surgery.  I have reviewed the patient's chart and labs.  Questions were answered to the patient's satisfaction.   Cath Lab Visit (complete for each Cath Lab visit)  Clinical Evaluation Leading to the Procedure:   ACS: Yes.    Non-ACS:    Anginal Classification: CCS III  Anti-ischemic medical therapy: Maximal Therapy (2 or more classes of medications)  Non-Invasive Test Results: No non-invasive testing performed  Prior CABG: Previous CABG        Theron Arista [facility] 03/08/2020 7:17 AM

## 2020-03-08 NOTE — Discharge Instructions (Signed)
Femoral Site Care This sheet gives you information about how to care for yourself after your procedure. Your health care provider may also give you more specific instructions. If you have problems or questions, contact your health care provider. What can I expect after the procedure? After the procedure, it is common to have:  Bruising that usually fades within 1-2 weeks.  Tenderness at the site. Follow these instructions at home: Wound care  Follow instructions from your health care provider about how to take care of your insertion site. Make sure you: ? Wash your hands with soap and water before you change your bandage (dressing). If soap and water are not available, use hand sanitizer. ? Change your dressing as told by your health care provider. ? Leave stitches (sutures), skin glue, or adhesive strips in place. These skin closures may need to stay in place for 2 weeks or longer. If adhesive strip edges start to loosen and curl up, you may trim the loose edges. Do not remove adhesive strips completely unless your health care provider tells you to do that.  Do not take baths, swim, or use a hot tub until your health care provider approves.  You may shower 24-48 hours after the procedure or as told by your health care provider. ? Gently wash the site with plain soap and water. ? Pat the area dry with a clean towel. ? Do not rub the site. This may cause bleeding.  Do not apply powder or lotion to the site. Keep the site clean and dry.  Check your femoral site every day for signs of infection. Check for: ? Redness, swelling, or pain. ? Fluid or blood. ? Warmth. ? Pus or a bad smell. Activity  For the first 2-3 days after your procedure, or as long as directed: ? Avoid climbing stairs as much as possible. ? Do not squat.  Do not lift anything that is heavier than 10 lb (4.5 kg), or the limit that you are told, until your health care provider says that it is safe.  Rest as  directed. ? Avoid sitting for a long time without moving. Get up to take short walks every 1-2 hours.  Do not drive for 24 hours if you were given a medicine to help you relax (sedative). General instructions  Take over-the-counter and prescription medicines only as told by your health care provider.  Keep all follow-up visits as told by your health care provider. This is important. Contact a health care provider if you have:  A fever or chills.  You have redness, swelling, or pain around your insertion site. Get help right away if:  The catheter insertion area swells very fast.  You pass out.  You suddenly start to sweat or your skin gets clammy.  The catheter insertion area is bleeding, and the bleeding does not stop when you hold steady pressure on the area.  The area near or just beyond the catheter insertion site becomes pale, cool, tingly, or numb. These symptoms may represent a serious problem that is an emergency. Do not wait to see if the symptoms will go away. Get medical help right away. Call your local emergency services (911 in the [place] Do not drive yourself to the hospital. Summary  After the procedure, it is common to have bruising that usually fades within 1-2 weeks.  Check your femoral site every day for signs of infection.  Do not lift anything that is heavier than 10 lb (4.5 kg), or the   limit that you are told, until your health care provider says that it is safe. This information is not intended to replace advice given to you by your health care provider. Make sure you discuss any questions you have with your health care provider. Document Revised: 03/09/2017 Document Reviewed: 03/09/2017 Elsevier Patient Education  2020 Elsevier Inc.  

## 2020-03-08 NOTE — Progress Notes (Signed)
Discharge instructions reviewed with pt and her husband (via telephone) both voice understanding.  

## 2020-03-27 ENCOUNTER — Ambulatory Visit: Payer: Medicare PPO | Admitting: Physician Assistant

## 2020-04-03 ENCOUNTER — Telehealth: Payer: Self-pay | Admitting: Cardiovascular Disease

## 2020-04-03 MED ORDER — TICAGRELOR 90 MG PO TABS: 90.0000 mg | ORAL_TABLET | Freq: Two times a day (BID) | ORAL | 2 refills | Status: DC

## 2020-04-03 NOTE — Telephone Encounter (Signed)
*  STAT* If patient is at the pharmacy, call can be transferred to refill team.   1. Which medications need to be refilled? (please list name of each medication and dose if known)  ticagrelor (BRILINTA) 90 MG TABS tablet     2. Which pharmacy/location (including street and city if local pharmacy) is medication to be sent to? [facility]  3. Do they need a 30 day or 90 day supply? 90 day

## 2020-04-03 NOTE — Telephone Encounter (Signed)
Rx has been sent to the pharmacy electronically. ° °

## 2020-04-05 ENCOUNTER — Other Ambulatory Visit: Payer: Self-pay

## 2020-04-05 ENCOUNTER — Ambulatory Visit: Payer: Medicare PPO | Admitting: Physician Assistant

## 2020-04-05 VITALS — BP 124/70 | HR 75 | Ht 65.0 in | Wt 169.8 lb

## 2020-04-05 DIAGNOSIS — E785 Hyperlipidemia, unspecified: Secondary | ICD-10-CM

## 2020-04-05 DIAGNOSIS — E119 Type 2 diabetes mellitus without complications: Secondary | ICD-10-CM

## 2020-04-05 DIAGNOSIS — I25810 Atherosclerosis of coronary artery bypass graft(s) without angina pectoris: Secondary | ICD-10-CM | POA: Diagnosis not present

## 2020-04-05 DIAGNOSIS — I10 Essential (primary) hypertension: Secondary | ICD-10-CM

## 2020-04-05 MED ORDER — TICAGRELOR 90 MG PO TABS: 90.0000 mg | ORAL_TABLET | Freq: Two times a day (BID) | ORAL | 3 refills | Status: DC

## 2020-04-05 NOTE — Progress Notes (Signed)
Cardiology Office Note:    Date:  04/08/2020   ID:  Tamara Nunez, DOB 08/05/1955, MRN [id number]  PCP:  Theodoro Kos, MD  [facility] Cardiologist:  Chrystie Nose, MD  [facility] Electrophysiologist:  None   Referring MD: Theodoro Kos, MD   Chief Complaint  Patient presents with  . Follow-up    Post cath    History of Present Illness:    Tamara Nunez is a 65 y.o. female with a hx of CAD s/p CABG 2006 with LIMA to LAD, SVG to distal LAD, hypertension, hyperlipidemia, GERD, fibromyalgia and DM2.  Cardiac catheterization in 2009 showed widely patent grafts.  Myoview March 2014 was low risk.  Patient underwent repeat cardiac catheterization in July 2018 that demonstrated 80% proximal RCA lesion that was treated with DES, she also had atretic LIMA to LAD with 100% occlusion of mid LAD, patent SVG to distal LAD, EF 55 to 60%.  Unfortunately given despite intervention, she continued to have intermittent chest discomfort.  Last cardiac catheterization performed in April 2020 demonstrated a single obstructive vessel with chronic total occlusion of mid LAD that was bypassed by SVG graft.  New 90% proximal D1 that was felt to be culprit lesion, this was treated with a drug-eluting stent.  She again underwent cardiac catheterization by Dr. Swaziland in June 2021 that demonstrated 75% distal RCA lesion that was treated with drug-eluting stent.  She recently presented to the ED on 02/19/2020 with chest pain.  Serial troponin was negative.  EKG shows no significant ischemia.   I last saw the patient in December 2021, her chest pain initially resolved after previous PCI in June 2021, however it has recurred since then.  We eventually recommend a relook cardiac catheterization on 03/08/2020 which showed patent stents, atretic LIMA to LAD, patent SVG to LAD, no new stenosis to explain her overall symptom.  Medical therapy was recommended.  Patient presents today for follow-up.  Chest pain has  improved.  He denies any lower extremity edema, orthopnea or PND.  She does have problems falling asleep and staying asleep.  I recommended melatonin or Benadryl.  She has also switched insurance and will want me to send her Brilinta to [employer].  Otherwise she can keep her follow-up with Dr. Allyson Sabal in April and follow-up with Dr. Rennis Golden in June.   Past Medical History:  Diagnosis Date  . Arthritis    "in my back" (05/06/2017)  . CAD (coronary artery disease)    CABG 2006, 09/2016 DES RCA, 04/2017 DES Circ, 06/2018 DES 1st diag  . Chronic back pain   . Coronary artery disease    a. s/p CABG in 2006 with LIMA-LAD, SVG-dLAD. b. cath in 2009 showing patent grafts. c. 09/2016: cath with occluded LAD but patent SVG-dLAD. LIMA was atretic. pRCA with 80% stenosis --> treated with DES  . Fibromyalgia   . GERD (gastroesophageal reflux disease)   . Hyperlipidemia   . Hypertension   . Type II diabetes mellitus (HCC)     Past Surgical History:  Procedure Laterality Date  . ABDOMINAL HERNIA REPAIR  2018  . BACK SURGERY    . CARDIAC CATHETERIZATION  01/13/2008  . COLONOSCOPY    . CORONARY ANGIOPLASTY WITH STENT PLACEMENT  09/26/2016   Prox RCA lesion, 80 %stenosed. DES   . CORONARY ARTERY BYPASS GRAFT  12/23/2004   2 vessel - LIMA to LAD (atretic), SVG to distal LAD  . CORONARY ATHERECTOMY N/A 08/16/2019  Procedure: CORONARY ATHERECTOMY;  Surgeon: Swaziland, Peter M, MD;  Location: [facility] INVASIVE CV LAB;  Service: Cardiovascular;  Laterality: N/A;  . CORONARY STENT INTERVENTION N/A 09/26/2016   Procedure: Coronary Stent Intervention;  Surgeon: Marykay Lex, MD;  Location: [facility] INVASIVE CV LAB;  Service: Cardiovascular;  Laterality: N/A;  . CORONARY STENT INTERVENTION N/A 05/06/2017   Procedure: CORONARY STENT INTERVENTION;  Surgeon: Kathleene Hazel, MD;  Location: [facility] INVASIVE CV LAB;  Service: Cardiovascular;  Laterality: N/A;  . CORONARY STENT INTERVENTION N/A 06/25/2018   Procedure:  CORONARY STENT INTERVENTION;  Surgeon: Swaziland, Peter M, MD;  Location: [facility] INVASIVE CV LAB;  Service: Cardiovascular;  Laterality: N/A;  . CORONARY STENT INTERVENTION N/A 08/12/2019   Procedure: CORONARY STENT INTERVENTION;  Surgeon: Lyn Records, MD;  Location: [facility] INVASIVE CV LAB;  Service: Cardiovascular;  Laterality: N/A;  . CORONARY STENT INTERVENTION N/A 08/16/2019   Procedure: CORONARY STENT INTERVENTION;  Surgeon: Swaziland, Peter M, MD;  Location: [facility] INVASIVE CV LAB;  Service: Cardiovascular;  Laterality: N/A;  . DOPPLER ECHOCARDIOGRAPHY  04/12/2008   EF 60%  . GASTRIC BYPASS  2009  . HERNIA REPAIR    . LEFT HEART CATH AND CORONARY ANGIOGRAPHY N/A 05/06/2017   Procedure: LEFT HEART CATH AND CORONARY ANGIOGRAPHY;  Surgeon: Kathleene Hazel, MD;  Location: [facility] INVASIVE CV LAB;  Service: Cardiovascular;  Laterality: N/A;  . LEFT HEART CATH AND CORS/GRAFTS ANGIOGRAPHY N/A 09/26/2016   Procedure: Left Heart Cath and Cors/Grafts Angiography;  Surgeon: Marykay Lex, MD;  Location: [facility] INVASIVE CV LAB;  Service: Cardiovascular;  Laterality: N/A;  . LEFT HEART CATH AND CORS/GRAFTS ANGIOGRAPHY N/A 06/25/2018   Procedure: LEFT HEART CATH AND CORS/GRAFTS ANGIOGRAPHY;  Surgeon: Swaziland, Peter M, MD;  Location: [facility] INVASIVE CV LAB;  Service: Cardiovascular;  Laterality: N/A;  . LEFT HEART CATH AND CORS/GRAFTS ANGIOGRAPHY N/A 08/12/2019   Procedure: LEFT HEART CATH AND CORS/GRAFTS ANGIOGRAPHY;  Surgeon: Lyn Records, MD;  Location: [facility] INVASIVE CV LAB;  Service: Cardiovascular;  Laterality: N/A;  . LEFT HEART CATH AND CORS/GRAFTS ANGIOGRAPHY N/A 03/08/2020   Procedure: LEFT HEART CATH AND CORS/GRAFTS ANGIOGRAPHY;  Surgeon: Swaziland, Peter M, MD;  Location: [facility] INVASIVE CV LAB;  Service: Cardiovascular;  Laterality: N/A;  . NM MYOVIEW LTD  06/03/2012   EF 71%  . POSTERIOR FUSION LUMBAR SPINE  1990s  . TUBAL LIGATION    . VAGINAL HYSTERECTOMY     "partial"    Current Medications: Current Meds  Medication  Sig  . acetaminophen (TYLENOL) 500 MG tablet Take 1,000 mg by mouth every 6 (six) hours as needed for moderate pain or headache.  . albuterol (PROVENTIL HFA;VENTOLIN HFA) 108 (90 BASE) MCG/ACT inhaler Inhale 2 puffs into the lungs every 6 (six) hours as needed for wheezing.  Marland Kitchen aspirin EC 81 MG tablet Take 81 mg by mouth daily.  Marland Kitchen atorvastatin (LIPITOR) 40 MG tablet Take 1 tablet (40 mg total) by mouth daily.  . cetirizine (ZYRTEC) 10 MG tablet Take 10 mg by mouth daily.  . cilostazol (PLETAL) 50 MG tablet Take 1 tablet (50 mg total) by mouth 2 (two) times daily.  Marland Kitchen CO-ENZYME Q-10 PO Take 200 mg by mouth daily.   . diclofenac sodium (VOLTAREN) 1 % GEL Apply 2 g topically 2 (two) times daily as needed (pain).  Marland Kitchen dicyclomine (BENTYL) 10 MG capsule Take 10 mg by mouth every 6 (six) hours as needed for spasms.  Tery Sanfilippo Calcium (STOOL SOFTENER PO) Take 1 tablet by  mouth daily.  . Fingerstix Lancets MISC Inject into the skin.  . fluticasone (FLONASE) 50 MCG/ACT nasal spray Place 1 spray into both nostrils 2 (two) times daily.  . furosemide (LASIX) 40 MG tablet TAKE 1 TABLET BY MOUTH  DAILY (Patient taking differently: Take 40 mg by mouth daily.)  . gabapentin (NEURONTIN) 600 MG tablet Take 600 mg by mouth 3 (three) times daily.   . metoprolol tartrate (LOPRESSOR) 50 MG tablet Take 1 tablet (50 mg total) by mouth 2 (two) times daily.  . nitroGLYCERIN (NITROSTAT) 0.4 MG SL tablet Place 1 tablet (0.4 mg total) under the tongue every 5 (five) minutes as needed for chest pain (up to 3 doses).  Letta Pate ULTRA test strip Test BG qid  . pantoprazole (PROTONIX) 40 MG tablet Take 40 mg by mouth daily as needed (acid reflux).   . polyethylene glycol powder (GLYCOLAX/MIRALAX) powder Take 17 g by mouth daily as needed for moderate constipation or mild constipation.  . ranolazine (RANEXA) 1000 MG SR tablet Take 1 tablet (1,000 mg total) by mouth 2 (two) times daily.  . vitamin B-12 (CYANOCOBALAMIN) 1000 MCG  tablet Take 1,000 mcg by mouth daily.  . Vitamin D, Ergocalciferol, (DRISDOL) 1.25 MG (50000 UNIT) CAPS capsule TAKE 1 CAPSULE BY MOUTH EVERY 7 DAYS (Patient taking differently: Take 50,000 Units by mouth every 7 (seven) days.)  . vitamin E 400 UNIT capsule Take 400 Units by mouth daily.  . [DISCONTINUED] ticagrelor (BRILINTA) 90 MG TABS tablet Take 1 tablet (90 mg total) by mouth 2 (two) times daily.   Current Facility-Administered Medications for the 04/05/20 encounter (Office Visit) with Azalee Course, PA  Medication  . sodium chloride flush (NS) 0.9 % injection 3 mL     Allergies:   Codeine, Other, Ciprofloxacin, Valsartan, Adhesive [tape], and Sulfa antibiotics   Social History   Socioeconomic History  . Marital status: Married    Spouse name: Not on file  . Number of children: Not on file  . Years of education: Not on file  . Highest education level: Not on file  Occupational History  . Not on file  Tobacco Use  . Smoking status: Never Smoker  . Smokeless tobacco: Never Used  Vaping Use  . Vaping Use: Never used  Substance and Sexual Activity  . Alcohol use: No  . Drug use: No  . Sexual activity: Not on file  Other Topics Concern  . Not on file  Social History Narrative  . Not on file   Social Determinants of Health   Financial Resource Strain: Not on file  Food Insecurity: Not on file  Transportation Needs: Not on file  Physical Activity: Not on file  Stress: Not on file  Social Connections: Not on file     Family History: The patient's family history includes Cancer - Lung in her father; Heart disease in her mother.  ROS:   Please see the history of present illness.     All other systems reviewed and are negative.  EKGs/Labs/Other Studies Reviewed:    The following studies were reviewed today:  Cath 03/08/2020   Non-stenotic Ost 1st Diag-2 lesion was previously treated.  Non-stenotic Ost 1st Diag-1 lesion was previously treated.  Prox LAD to Mid LAD  lesion is 100% stenosed.  Prox LAD-2 lesion is 100% stenosed.  Non-stenotic Prox LAD-1 lesion was previously treated.  Ost 2nd Mrg lesion is 70% stenosed.  Prox Cx lesion is 40% stenosed.  Mid Cx lesion is 20% stenosed.  Non-stenotic Ost Cx lesion was previously treated.  Prox RCA lesion is 10% stenosed.  Prox RCA to Mid RCA lesion is 30% stenosed.  LIMA.  Origin to Mid Graft lesion is 99% stenosed.  SVG and is normal in caliber.  Mid RCA to Dist RCA lesion is 35% stenosed.  The left ventricular systolic function is normal.  LV end diastolic pressure is normal.  The left ventricular ejection fraction is 55-65% by visual estimate.   1. CAD with continued patency of prior stents. No new stenoses to explain her recent symptoms. 2. Patent SVG to LAD 3. Atretic LIMA to the LAD which is supplied by the SVG 4. Normal LV function 5. Normal LVEDP  Plan: continue medical therapy.   EKG:  EKG is not ordered today.    Recent Labs: 08/17/2019: ALT 26 01/11/2020: TSH 0.80 03/05/2020: BUN 17; Creatinine, Ser 0.86; Hemoglobin 11.3; Platelets 407; Potassium 4.5; Sodium 139  Recent Lipid Panel    Component Value Date/Time   CHOL 136 01/11/2020 0000   CHOL 131 07/05/2018 1152   TRIG 61 01/11/2020 0000   HDL 83 (A) 01/11/2020 0000   HDL 83 07/05/2018 1152   CHOLHDL 1.6 07/05/2018 1152   CHOLHDL 1.8 04/15/2010 0400   VLDL 3 04/15/2010 0400   LDLCALC 41 01/11/2020 0000   LDLCALC 38 07/05/2018 1152     Risk Assessment/Calculations:       Physical Exam:    VS:  BP 124/70 (BP Location: Left Arm, Patient Position: Sitting, Cuff Size: Normal)   Pulse 75   Ht 5\' 5"  (1.651 m)   Wt 169 lb 12.8 oz (77 kg)   SpO2 100%   BMI 28.26 kg/m     Wt Readings from Last 3 Encounters:  04/05/20 169 lb 12.8 oz (77 kg)  03/08/20 172 lb (78 kg)  02/28/20 172 lb (78 kg)     GEN:  Well nourished, well developed in no acute distress HEENT: Normal NECK: No JVD; No carotid  bruits LYMPHATICS: No lymphadenopathy CARDIAC: RRR, no murmurs, rubs, gallops RESPIRATORY:  Clear to auscultation without rales, wheezing or rhonchi  ABDOMEN: Soft, non-tender, non-distended MUSCULOSKELETAL:  No edema; No deformity  SKIN: Warm and dry NEUROLOGIC:  Alert and oriented x 3 PSYCHIATRIC:  Normal affect   ASSESSMENT:    1. Coronary artery disease involving coronary bypass graft of native heart without angina pectoris   2. Primary hypertension   3. Hyperlipidemia LDL goal <70   4. Controlled type 2 diabetes mellitus without complication, without long-term current use of insulin (HCC)    PLAN:    In order of problems listed above:  1. CAD s/p CABG: Despite recent chest discomfort, cardiac catheterization did not reveal any culprit lesion. We will continue aspirin and Brilinta.  2. Hypertension: Continue on current therapy  3. Hyperlipidemia: Continue Lipitor  4. DM2: Managed by primary care provider.        Medication Adjustments/Labs and Tests Ordered: Current medicines are reviewed at length with the patient today.  Concerns regarding medicines are outlined above.  No orders of the defined types were placed in this encounter.  Meds ordered this encounter  Medications  . ticagrelor (BRILINTA) 90 MG TABS tablet    Sig: Take 1 tablet (90 mg total) by mouth 2 (two) times daily.    Dispense:  180 tablet    Refill:  3    Patient Instructions  Medication Instructions:  The current medical regimen is effective;  continue present plan and medications  as directed. Please refer to the Current Medication list given to you today.  *If you need a refill on your cardiac medications before your next appointment, please call your pharmacy*  Lab Work:   Testing/Procedures:  NONE    NONE  Follow-Up: Your next appointment:  6 month(s) In Person with K. Italyhad Hilty, MD OR IF UNAVAILABLE Antar Milks, PA-C  Please call our office 2 months in advance to schedule this  appointment   At [facility], you and your health needs are our priority.  As part of our continuing mission to provide you with exceptional heart care, we have created designated Provider Care Teams.  These Care Teams include your primary Cardiologist (physician) and Advanced Practice Providers (APPs -  Physician Assistants and Nurse Practitioners) who all work together to provide you with the care you need, when you need it.     Ramond DialSigned, Izayiah Tibbitts, [place]  04/08/2020 11:28 AM    [facility]

## 2020-04-05 NOTE — Patient Instructions (Signed)
Medication Instructions:  The current medical regimen is effective;  continue present plan and medications as directed. Please refer to the Current Medication list given to you today.  *If you need a refill on your cardiac medications before your next appointment, please call your pharmacy*  Lab Work:   Testing/Procedures:  NONE    NONE  Follow-Up: Your next appointment:  6 month(s) In Person with K. [place] Hilty, MD OR IF UNAVAILABLE HAO MENG, PA-C  Please call our office 2 months in advance to schedule this appointment   At [facility], you and your health needs are our priority.  As part of our continuing mission to provide you with exceptional heart care, we have created designated Provider Care Teams.  These Care Teams include your primary Cardiologist (physician) and Advanced Practice Providers (APPs -  Physician Assistants and Nurse Practitioners) who all work together to provide you with the care you need, when you need it.

## 2020-04-08 ENCOUNTER — Encounter: Payer: Self-pay | Admitting: Physician Assistant

## 2020-04-10 ENCOUNTER — Ambulatory Visit: Payer: Commercial Managed Care - PPO | Admitting: Cardiovascular Disease

## 2020-04-30 ENCOUNTER — Ambulatory Visit: Payer: Medicare PPO | Admitting: Physician Assistant

## 2020-06-13 ENCOUNTER — Telehealth: Payer: Self-pay | Admitting: Internal Medicine

## 2020-06-13 MED ORDER — METOPROLOL TARTRATE 50 MG PO TABS: 50.0000 mg | ORAL_TABLET | Freq: Two times a day (BID) | ORAL | 2 refills | Status: AC

## 2020-06-13 NOTE — Telephone Encounter (Signed)
Pt c/o medication issue:  1. Name of Medication:   metoprolol tartrate (LOPRESSOR) 50 MG tablet    2. How are you currently taking this medication (dosage and times per day)? Needs clarification  3. Are you having a reaction (difficulty breathing--STAT)? no  4. What is your medication issue? Patient states [employer] needs to clarify what mg of the medication the patient is taking. Phone [phone]

## 2020-06-13 NOTE — Telephone Encounter (Signed)
Attempted to call [facility] and after going through prompts and being on hold for 3-4 minutes the call was disconnected. I sent a new Rx for metoprolol tartrate 50 mg take 1 pill twice daily to [facility] and left a detailed message for the patient about the call and the refill. I advised her to call back with additional questions or concerns.

## 2020-06-27 ENCOUNTER — Ambulatory Visit: Payer: Commercial Managed Care - PPO | Admitting: Cardiovascular Disease

## 2020-07-04 ENCOUNTER — Other Ambulatory Visit: Payer: Self-pay

## 2020-07-04 ENCOUNTER — Ambulatory Visit (INDEPENDENT_AMBULATORY_CARE_PROVIDER_SITE_OTHER): Payer: Medicare PPO | Admitting: Cardiovascular Disease

## 2020-07-04 ENCOUNTER — Encounter: Payer: Self-pay | Admitting: Cardiovascular Disease

## 2020-07-04 VITALS — BP 128/76 | HR 73 | Ht 66.0 in | Wt 170.0 lb

## 2020-07-04 DIAGNOSIS — I739 Peripheral vascular disease, unspecified: Secondary | ICD-10-CM | POA: Diagnosis not present

## 2020-07-04 MED ORDER — CILOSTAZOL 50 MG PO TABS
50.0000 mg | ORAL_TABLET | Freq: Two times a day (BID) | ORAL | 3 refills | Status: DC
Start: 2020-07-04 — End: 2022-07-02

## 2020-07-04 NOTE — Patient Instructions (Signed)
Medication Instructions:  Your physician recommends that you continue on your current medications as directed. Please refer to the Current Medication list given to you today.  *If you need a refill on your cardiac medications before your next appointment, please call your pharmacy*   Follow-Up: At [facility], you and your health needs are our priority.  As part of our continuing mission to provide you with exceptional heart care, we have created designated Provider Care Teams.  These Care Teams include your primary Cardiologist (physician) and Advanced Practice Providers (APPs -  Physician Assistants and Nurse Practitioners) who all work together to provide you with the care you need, when you need it.  We recommend signing up for the patient portal called "MyChart".  Sign up information is provided on this After Visit Summary.  MyChart is used to connect with patients for Virtual Visits (Telemedicine).  Patients are able to view lab/test results, encounter notes, upcoming appointments, etc.  Non-urgent messages can be sent to your provider as well.   To learn more about what you can do with MyChart, go to [email or web address].    Your next appointment:   No future appointments at this time. We will see you on an as needed basis.  Provider:   Jonathan Berry, MD 

## 2020-07-04 NOTE — Progress Notes (Signed)
Ms. Noguera returns today for follow-up of her PAD.  I saw her 02/24/2020 for claudication.  Her Dopplers performed 02/16/2020 revealed noncompressible ABIs bilaterally with prospectively tibial vessel disease but no large vessel disease.  She did have a cardiac catheterization performed after I saw her revealing patent stents and medical therapy was recommended.  I did begin her on cilostazol which resulted in marked improvement in her claudication symptoms.  I will see her back as needed.  Runell Gess, M.D., FACP, [facility], [facility] [place] [address] [place], [place]  [address]  [phone] 07/04/2020 11:18 AM

## 2020-07-04 NOTE — Addendum Note (Signed)
Addended by: Bernita Buffy on: 07/04/2020 11:28 AM   Modules accepted: Orders

## 2020-07-09 ENCOUNTER — Other Ambulatory Visit: Payer: Self-pay | Admitting: "Endocrinology

## 2020-07-12 LAB — VITAMIN D 25 HYDROXY (VIT D DEFICIENCY, FRACTURES): Vit D, 25-Hydroxy: 31.1 ng/mL (ref 30.0–100.0)

## 2020-07-17 ENCOUNTER — Encounter: Payer: Self-pay | Admitting: "Endocrinology

## 2020-07-17 ENCOUNTER — Encounter: Payer: Medicare PPO | Attending: "Endocrinology | Admitting: Nutrition

## 2020-07-17 ENCOUNTER — Other Ambulatory Visit: Payer: Self-pay

## 2020-07-17 ENCOUNTER — Ambulatory Visit: Payer: Medicare PPO | Admitting: "Endocrinology

## 2020-07-17 VITALS — Ht 64.0 in | Wt 174.0 lb

## 2020-07-17 VITALS — BP 146/74 | HR 72 | Ht 66.0 in | Wt 174.0 lb

## 2020-07-17 DIAGNOSIS — Z951 Presence of aortocoronary bypass graft: Secondary | ICD-10-CM | POA: Diagnosis present

## 2020-07-17 DIAGNOSIS — E1159 Type 2 diabetes mellitus with other circulatory complications: Secondary | ICD-10-CM | POA: Diagnosis not present

## 2020-07-17 DIAGNOSIS — E782 Mixed hyperlipidemia: Secondary | ICD-10-CM | POA: Diagnosis not present

## 2020-07-17 DIAGNOSIS — I10 Essential (primary) hypertension: Secondary | ICD-10-CM | POA: Insufficient documentation

## 2020-07-17 DIAGNOSIS — E119 Type 2 diabetes mellitus without complications: Secondary | ICD-10-CM | POA: Diagnosis present

## 2020-07-17 DIAGNOSIS — E559 Vitamin D deficiency, unspecified: Secondary | ICD-10-CM | POA: Diagnosis not present

## 2020-07-17 DIAGNOSIS — Z9884 Bariatric surgery status: Secondary | ICD-10-CM | POA: Insufficient documentation

## 2020-07-17 LAB — POCT GLYCOSYLATED HEMOGLOBIN (HGB A1C): HbA1c, POC (controlled diabetic range): 5.8 % (ref 0.0–7.0)

## 2020-07-17 NOTE — Patient Instructions (Signed)
                                     Advice for Weight Management  -For most of us the best way to lose weight is by diet management. Generally speaking, diet management means consuming less calories intentionally which over time brings about progressive weight loss.  This can be achieved more effectively by restricting carbohydrate consumption to the minimum possible.  So, it is critically important to know your numbers: how much calorie you are consuming and how much calorie you need. More importantly, our carbohydrates sources should be unprocessed or minimally processed complex starch food items.   Sometimes, it is important to balance nutrition by increasing protein intake (animal or plant source), fruits, and vegetables.  -Sticking to a routine mealtime to eat 3 meals a day and avoiding unnecessary snacks is shown to have a big role in weight control. Under normal circumstances, the only time we lose real weight is when we are hungry, so allow hunger to take place- hunger means no food between meal times, only water.  It is not advisable to starve.   -It is better to avoid simple carbohydrates including: Cakes, Sweet Desserts, Ice Cream, Soda (diet and regular), Sweet Tea, Candies, Chips, Cookies, Store Bought Juices, Alcohol in Excess of  1-2 drinks a day, Lemonade,  Artificial Sweeteners, Doughnuts, Coffee Creamers, "Sugar-free" Products, etc, etc.  This is not a complete list.....    -Consulting with certified diabetes educators is proven to provide you with the most accurate and current information on diet.  Also, you may be  interested in discussing diet options/exchanges , we can schedule a visit with Tamara Nunez, RDN, CDE for individualized nutrition education.  -Exercise: If you are able: 30 -60 minutes a day ,4 days a week, or 150 minutes a week.  The longer the better.  Combine stretch, strength, and aerobic activities.  If you were told in the  past that you have high risk for cardiovascular diseases, you may seek evaluation by your heart doctor prior to initiating moderate to intense exercise programs.                                  Additional Care Considerations for Diabetes   -Diabetes  is a chronic disease.  The most important care consideration is regular follow-up with your diabetes care provider with the goal being avoiding or delaying its complications and to take advantage of advances in medications and technology.    -Type 2 diabetes is known to coexist with other important comorbidities such as high blood pressure and high cholesterol.  It is critical to control not only the diabetes but also the high blood pressure and high cholesterol to minimize and delay the risk of complications including coronary artery disease, stroke, amputations, blindness, etc.    - Studies showed that people with diabetes will benefit from a class of medications known as ACE inhibitors and statins.  Unless there are specific reasons not to be on these medications, the standard of care is to consider getting one from these groups of medications at an optimal doses.  These medications are generally considered safe and proven to help protect the heart and the kidneys.    - People with diabetes are encouraged to initiate and maintain regular follow-up with eye doctors, foot   doctors, dentists , and if necessary heart and kidney doctors.     - It is highly recommended that people with diabetes quit smoking or stay away from smoking, and get yearly  flu vaccine and pneumonia vaccine at least every 5 years.  One other important lifestyle recommendation is to ensure adequate sleep - at least 6-7 hours of uninterrupted sleep at night.  -Exercise: If you are able: 30 -60 minutes a day, 4 days a week, or 150 minutes a week.  The longer the better.  Combine stretch, strength, and aerobic activities.  If you were told in the past that you have high risk for  cardiovascular diseases, you may seek evaluation by your heart doctor prior to initiating moderate to intense exercise programs.          

## 2020-07-17 NOTE — Progress Notes (Signed)
07/17/2020, 10:29 AM            Endocrinology follow-up note  Subjective:    Patient ID: Tamara Nunez, female    DOB: 18-Dec-1955.  Tamara Nunez is being seen in follow-up for type 2 diabetes which is currently controlled on diet and exercise with A1c of 6.3%.    PMD:   Theodoro Kos, MD.   Past Medical History:  Diagnosis Date  . Arthritis    "in my back" (05/06/2017)  . CAD (coronary artery disease)    CABG 2006, 09/2016 DES RCA, 04/2017 DES Circ, 06/2018 DES 1st diag  . Chronic back pain   . Coronary artery disease    a. s/p CABG in 2006 with LIMA-LAD, SVG-dLAD. b. cath in 2009 showing patent grafts. c. 09/2016: cath with occluded LAD but patent SVG-dLAD. LIMA was atretic. pRCA with 80% stenosis --> treated with DES  . Fibromyalgia   . GERD (gastroesophageal reflux disease)   . Hyperlipidemia   . Hypertension   . Type II diabetes mellitus (HCC)     Past Surgical History:  Procedure Laterality Date  . ABDOMINAL HERNIA REPAIR  2018  . BACK SURGERY    . CARDIAC CATHETERIZATION  01/13/2008  . COLONOSCOPY    . CORONARY ANGIOPLASTY WITH STENT PLACEMENT  09/26/2016   Prox RCA lesion, 80 %stenosed. DES   . CORONARY ARTERY BYPASS GRAFT  12/23/2004   2 vessel - LIMA to LAD (atretic), SVG to distal LAD  . CORONARY ATHERECTOMY N/A 08/16/2019   Procedure: CORONARY ATHERECTOMY;  Surgeon: Swaziland, Peter M, MD;  Location: [facility] INVASIVE CV LAB;  Service: Cardiovascular;  Laterality: N/A;  . CORONARY STENT INTERVENTION N/A 09/26/2016   Procedure: Coronary Stent Intervention;  Surgeon: Marykay Lex, MD;  Location: [facility] INVASIVE CV LAB;  Service: Cardiovascular;  Laterality: N/A;  . CORONARY STENT INTERVENTION N/A 05/06/2017   Procedure: CORONARY STENT INTERVENTION;  Surgeon: Kathleene Hazel, MD;  Location: [facility] INVASIVE CV LAB;  Service: Cardiovascular;  Laterality: N/A;  . CORONARY STENT  INTERVENTION N/A 06/25/2018   Procedure: CORONARY STENT INTERVENTION;  Surgeon: Swaziland, Peter M, MD;  Location: [facility] INVASIVE CV LAB;  Service: Cardiovascular;  Laterality: N/A;  . CORONARY STENT INTERVENTION N/A 08/12/2019   Procedure: CORONARY STENT INTERVENTION;  Surgeon: Lyn Records, MD;  Location: [facility] INVASIVE CV LAB;  Service: Cardiovascular;  Laterality: N/A;  . CORONARY STENT INTERVENTION N/A 08/16/2019   Procedure: CORONARY STENT INTERVENTION;  Surgeon: Swaziland, Peter M, MD;  Location: [facility] INVASIVE CV LAB;  Service: Cardiovascular;  Laterality: N/A;  . DOPPLER ECHOCARDIOGRAPHY  04/12/2008   EF 60%  . GASTRIC BYPASS  2009  . HERNIA REPAIR    . LEFT HEART CATH AND CORONARY ANGIOGRAPHY N/A 05/06/2017   Procedure: LEFT HEART CATH AND CORONARY ANGIOGRAPHY;  Surgeon: Kathleene Hazel, MD;  Location: [facility] INVASIVE CV LAB;  Service: Cardiovascular;  Laterality: N/A;  . LEFT HEART CATH AND CORS/GRAFTS ANGIOGRAPHY N/A 09/26/2016   Procedure: Left Heart Cath and Cors/Grafts Angiography;  Surgeon: Marykay Lex, MD;  Location: [facility] INVASIVE CV LAB;  Service: Cardiovascular;  Laterality: N/A;  . LEFT HEART CATH AND CORS/GRAFTS ANGIOGRAPHY N/A 06/25/2018   Procedure: LEFT HEART CATH AND CORS/GRAFTS ANGIOGRAPHY;  Surgeon: Swaziland, Peter M, MD;  Location: [facility] INVASIVE CV LAB;  Service: Cardiovascular;  Laterality: N/A;  . LEFT HEART CATH AND CORS/GRAFTS ANGIOGRAPHY N/A 08/12/2019   Procedure: LEFT HEART CATH AND CORS/GRAFTS ANGIOGRAPHY;  Surgeon: Lyn Records, MD;  Location: [facility] INVASIVE CV LAB;  Service: Cardiovascular;  Laterality: N/A;  . LEFT HEART CATH AND CORS/GRAFTS ANGIOGRAPHY N/A 03/08/2020   Procedure: LEFT HEART CATH AND CORS/GRAFTS ANGIOGRAPHY;  Surgeon: Swaziland, Peter M, MD;  Location: [facility] INVASIVE CV LAB;  Service: Cardiovascular;  Laterality: N/A;  . NM MYOVIEW LTD  06/03/2012   EF 71%  . POSTERIOR FUSION LUMBAR SPINE  1990s  . TUBAL LIGATION    . VAGINAL HYSTERECTOMY     "partial"    Social  History   Socioeconomic History  . Marital status: Married    Spouse name: Not on file  . Number of children: Not on file  . Years of education: Not on file  . Highest education level: Not on file  Occupational History  . Not on file  Tobacco Use  . Smoking status: Never Smoker  . Smokeless tobacco: Never Used  Vaping Use  . Vaping Use: Never used  Substance and Sexual Activity  . Alcohol use: No  . Drug use: No  . Sexual activity: Not on file  Other Topics Concern  . Not on file  Social History Narrative  . Not on file   Social Determinants of Health   Financial Resource Strain: Not on file  Food Insecurity: Not on file  Transportation Needs: Not on file  Physical Activity: Not on file  Stress: Not on file  Social Connections: Not on file    Family History  Problem Relation Age of Onset  . Heart disease Mother   . Cancer - Lung Father     Outpatient Encounter Medications as of 07/17/2020  Medication Sig  . cyclobenzaprine (FLEXERIL) 5 MG tablet TAKE 1 TABLET BY MOUTH THREE TIMES A DAY FOR 5 DAYS - [facility].  Marland Kitchen acetaminophen (TYLENOL) 500 MG tablet Take 1,000 mg by mouth every 6 (six) hours as needed for moderate pain or headache.  . albuterol (PROVENTIL HFA;VENTOLIN HFA) 108 (90 BASE) MCG/ACT inhaler Inhale 2 puffs into the lungs every 6 (six) hours as needed for wheezing.  Marland Kitchen amLODipine (NORVASC) 2.5 MG tablet Take 1 tablet (2.5 mg total) by mouth daily.  Marland Kitchen aspirin EC 81 MG tablet Take 81 mg by mouth daily.  Marland Kitchen atorvastatin (LIPITOR) 40 MG tablet Take 1 tablet (40 mg total) by mouth daily.  . cetirizine (ZYRTEC) 10 MG tablet Take 10 mg by mouth daily.  . cilostazol (PLETAL) 50 MG tablet Take 1 tablet (50 mg total) by mouth 2 (two) times daily.  Marland Kitchen CO-ENZYME Q-10 PO Take 200 mg by mouth daily.   . diclofenac sodium (VOLTAREN) 1 % GEL Apply 2 g topically 2 (two) times daily as needed (pain).  Marland Kitchen dicyclomine (BENTYL) 10 MG capsule Take 10 mg by mouth every 6  (six) hours as needed for spasms.  Tery Sanfilippo Calcium (STOOL SOFTENER PO) Take 1 tablet by mouth daily.  . Fingerstix Lancets MISC Inject into the skin.  . fluticasone (FLONASE) 50 MCG/ACT nasal spray Place 1 spray into both nostrils 2 (two) times daily.  . furosemide (LASIX) 40 MG tablet TAKE 1 TABLET BY MOUTH  DAILY (Patient taking differently: Take 40 mg by mouth daily.)  . gabapentin (NEURONTIN) 600 MG tablet Take 600 mg by mouth 3 (three) times daily.   . metoprolol tartrate (LOPRESSOR) 50 MG tablet Take 1 tablet (50 mg total) by mouth 2 (two) times daily.  . nitroGLYCERIN (NITROSTAT) 0.4 MG SL tablet Place 1 tablet (0.4 mg total) under the tongue every 5 (five) minutes as needed for chest pain (up to 3 doses).  Letta Pate ULTRA test strip Test BG qid  . pantoprazole (PROTONIX) 40 MG tablet Take 40 mg by mouth daily as needed (acid reflux).   . polyethylene glycol powder (GLYCOLAX/MIRALAX) powder Take 17 g by mouth daily as needed for moderate constipation or mild constipation.  . ranolazine (RANEXA) 1000 MG SR tablet Take 1 tablet (1,000 mg total) by mouth 2 (two) times daily.  . ticagrelor (BRILINTA) 90 MG TABS tablet Take 1 tablet (90 mg total) by mouth 2 (two) times daily.  . vitamin B-12 (CYANOCOBALAMIN) 1000 MCG tablet Take 1,000 mcg by mouth daily.  . vitamin E 400 UNIT capsule Take 400 Units by mouth daily.  . [DISCONTINUED] Vitamin D, Ergocalciferol, (DRISDOL) 1.25 MG (50000 UNIT) CAPS capsule Take 1 capsule (50,000 Units total) by mouth every 7 (seven) days.   Facility-Administered Encounter Medications as of 07/17/2020  Medication  . sodium chloride flush (NS) 0.9 % injection 3 mL    ALLERGIES: Allergies  Allergen Reactions  . Codeine Hives and Shortness Of Breath       . Other Anaphylaxis    Nuts  . Ciprofloxacin Itching  . Valsartan Rash  . Adhesive [Tape] Itching    "clear tape" used after surgery."took all her skin off".  Paper take is ok  . Sulfa Antibiotics  Nausea Only    VACCINATION STATUS:  There is no immunization history on file for this patient.  Diabetes She presents for her follow-up diabetic visit. She has type 2 diabetes mellitus. Onset time: She was diagnosed at approximate age of 34 years. Her disease course has been improving. There are no hypoglycemic associated symptoms. Pertinent negatives for hypoglycemia include no confusion, headaches, pallor or seizures. Pertinent negatives for diabetes include no chest pain, no fatigue, no polydipsia, no polyphagia and no polyuria. There are no hypoglycemic complications. Symptoms are improving. Diabetic complications include heart disease. Risk factors for coronary artery disease include dyslipidemia, diabetes mellitus, hypertension, sedentary lifestyle and post-menopausal. When asked about current treatments, none were reported. Her weight is fluctuating minimally. She is following a generally unhealthy diet. When asked about meal planning, she reported none. She has not had a previous visit with a dietitian. She never participates in exercise. There is no change in her home blood glucose trend. (She presents with controlled glycemic profile.  Her point-of-care is 5.8%, improving from 6.3%.  She denies any hypoglycemia.    ) An ACE inhibitor/angiotensin II receptor blocker is being taken. Eye exam is current.  Hyperlipidemia This is a chronic problem. The current episode started more than 1 year ago. The problem is controlled. Exacerbating diseases include diabetes. Pertinent negatives include no chest pain, myalgias or shortness of breath. Risk factors for coronary artery disease include dyslipidemia, diabetes mellitus, hypertension, a sedentary lifestyle and post-menopausal.  Hypertension This is a chronic problem. The problem is controlled. Pertinent negatives include no chest pain, headaches, palpitations or shortness of breath. Risk factors for coronary artery disease include dyslipidemia,  diabetes mellitus, sedentary lifestyle, family history and post-menopausal state. Past treatments include ACE inhibitors.  Hypertensive end-organ damage includes CAD/MI.    Review of systems: Limited as above.  Objective:    BP (!) 146/74   Pulse 72   Ht 5\' 6"  (1.676 m)   Wt 174 lb (78.9 kg)   BMI 28.08 kg/m   Wt Readings from Last 3 Encounters:  07/17/20 174 lb (78.9 kg)  07/17/20 174 lb (78.9 kg)  07/04/20 170 lb (77.1 kg)       Physical Exam- Limited  Constitutional:  Body mass index is 28.08 kg/m. , not in acute distress, normal state of mind     CMP     Component Value Date/Time   NA 139 03/05/2020 1207   K 4.5 03/05/2020 1207   CL 100 03/05/2020 1207   CO2 27 03/05/2020 1207   GLUCOSE 79 03/05/2020 1207   GLUCOSE 88 02/19/2020 1638   BUN 17 03/05/2020 1207   CREATININE 0.86 03/05/2020 1207   CREATININE 0.94 10/18/2018 1000   CALCIUM 9.5 03/05/2020 1207   PROT 6.7 08/17/2019 1005   PROT 7.1 01/01/2018 0900   ALBUMIN 3.2 (L) 08/17/2019 1005   ALBUMIN 4.2 01/01/2018 0900   AST 52 (H) 08/17/2019 1005   ALT 26 08/17/2019 1005   ALKPHOS 91 08/17/2019 1005   BILITOT 0.5 08/17/2019 1005   BILITOT 0.3 01/01/2018 0900   GFRNONAA 71 03/05/2020 1207   GFRNONAA >60 02/19/2020 1638   GFRNONAA 64 10/18/2018 1000   GFRAA 82 03/05/2020 1207   GFRAA 74 10/18/2018 1000     Diabetic Labs (most recent): Lab Results  Component Value Date   HGBA1C 5.8 07/17/2020   HGBA1C 6.1 01/11/2020   HGBA1C 6.3 04/27/2019     Lipid Panel ( most recent) Lipid Panel     Component Value Date/Time   CHOL 136 01/11/2020 0000   CHOL 131 07/05/2018 1152   TRIG 61 01/11/2020 0000   HDL 83 (A) 01/11/2020 0000   HDL 83 07/05/2018 1152   CHOLHDL 1.6 07/05/2018 1152   CHOLHDL 1.8 04/15/2010 0400   VLDL 3 04/15/2010 0400   LDLCALC 41 01/11/2020 0000   LDLCALC 38 07/05/2018 1152      Lab Results  Component Value Date   TSH 0.80 01/11/2020   TSH 1.16 04/27/2019   TSH 1.47  05/12/2018   TSH 1.540 01/01/2018   TSH 1.268 04/14/2010       Assessment & Plan:   1. DM type 2 causing vascular disease (HCC)   - Tamara Nunez has currently controlled  type 2 DM since  65 years of age. -She is not on any medication for diabetes at this time. She presents with controlled glycemic profile.  Her point-of-care is 5.8%, improving from 6.3%.  She denies any hypoglycemia.      -her diabetes is complicated by coronary artery disease, sedentary life, and she remains at a high risk for more acute and chronic complications which include CAD, CVA, CKD, retinopathy, and neuropathy. These are all discussed in detail with her.  - I have counseled her on diet management and weight loss, by adopting a carbohydrate restricted/protein rich diet.  - she acknowledges that there is a room for improvement in her food and drink choices. - Suggestion is made for her to avoid simple carbohydrates  from her diet including Cakes, Sweet Desserts, Ice Cream, Soda (diet and regular), Sweet Tea, Candies, Chips, Cookies, Store Bought Juices, Alcohol in Excess of  1-2 drinks a day, Artificial Sweeteners,  Coffee Creamer, and "Sugar-free" Products, Lemonade. This  will help patient to have more stable blood glucose profile and potentially avoid unintended weight gain.    - I encouraged her to switch to  unprocessed or minimally processed complex starch and increased protein intake (animal or plant source), fruits, and vegetables.  - she is advised to stick to a routine mealtimes to eat 3 meals  a day and avoid unnecessary snacks ( to snack only to correct hypoglycemia).  - I have approached her with the following individualized plan to manage  her diabetes and patient agrees:   -Her presentation with tightly controlled glycemic profile and stable A1c of 5.8% without any medications gives her a chance to stay off of medications at this time.   -She does have anemia for which she is being treated  with iron supplements, her hemoglobin is 11.7 g/dl.  - Patient specific target  A1c;  LDL, HDL, Triglycerides, were discussed in detail.  2) Blood Pressure /Hypertension:  -Her blood pressure is slightly above target. -   she is advised to continue her current medications including  Ramipril 5 mg p.o. daily with breakfast .  3) Lipids/Hyperlipidemia:   Review of her recent lipid panel showed controlled LDL of 41.  In light of her cardiac history,   she  is advised to continue simvastatin 20 mg p.o. daily at bedtime.     4)  Weight/Diet: Her BMI is 29.87 -she is  a candidate for some  major weight loss, I discussed with her the fact that loss of 5 - 10% of her  current body weight will have the most impact on her diabetes management.  CDE Consult will be initiated . Exercise, and detailed carbohydrates information provided  -  detailed on discharge instructions.  5) vitamin D deficiency: -Her 25-hydroxy vitamin D has improved to 31.1 .  She is advised to finish her current supplies of vitamin D2 50,000 units weekly and maintain with vitamin D3 1000 units daily.    6) Chronic Care/Health Maintenance:  -she  is on ACEI/ARB and Statin medications and  is encouraged to initiate and continue to follow up with Ophthalmology, Dentist,  Podiatrist at least yearly or according to recommendations, and advised to   stay away from smoking. I have recommended yearly flu vaccine and pneumonia vaccine at least every 5 years; moderate intensity exercise for up to 150 minutes weekly; and  sleep for at least 7 hours a day.  - she is  advised to maintain close follow up with Theodoro Kos, MD for primary care needs, as well as her other providersfor optimal and coordinated care.     I spent 32 minutes in the care of the patient today including review of labs from CMP, Lipids, Thyroid Function, Hematology (current and previous including abstractions from other facilities); face-to-face time discussing  her  blood glucose readings/logs, discussing hypoglycemia and hyperglycemia episodes and symptoms, medications doses, her options of short and long term treatment based on the latest standards of care / guidelines;  discussion about incorporating lifestyle medicine;  and documenting the encounter.    Please refer to Patient Instructions for Blood Glucose Monitoring and Insulin/Medications Dosing Guide"  in media tab for additional information. Please  also refer to " Patient Self Inventory" in the Media  tab for reviewed elements of pertinent patient history.  Tamara Kaplan participated in the discussions, expressed understanding, and voiced agreement with the above plans.  All questions were answered to her satisfaction. she is encouraged to contact clinic  should she have any questions or concerns prior to her return visit.    Follow up plan: - Return in about 6 months (around 01/17/2021) for F/U with Pre-visit Labs, A1c -NV, ABI in Office NV.  Marquis Lunch, MD [facility] [address] [place], [place] [address] Phone: [phone]  Fax: [phone]    07/17/2020, 10:29 AM  This note was partially dictated with voice recognition software. Similar sounding words can be transcribed inadequately or may not  be corrected upon review.

## 2020-07-17 NOTE — Patient Instructions (Signed)
Goals  Cut out fat and processed foods Increase fiber rich foods Increase walking 30 minutes daily. Increase more lower carb vegetables Buy fresh fruits and vegetables from farmers markets.
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
## 2020-07-19 ENCOUNTER — Other Ambulatory Visit: Payer: Self-pay

## 2020-07-19 ENCOUNTER — Ambulatory Visit (INDEPENDENT_AMBULATORY_CARE_PROVIDER_SITE_OTHER): Payer: Medicare PPO | Admitting: Internal Medicine

## 2020-07-19 ENCOUNTER — Encounter: Payer: Self-pay | Admitting: Internal Medicine

## 2020-07-19 VITALS — BP 123/81 | HR 70 | Ht 64.0 in | Wt 171.4 lb

## 2020-07-19 DIAGNOSIS — I739 Peripheral vascular disease, unspecified: Secondary | ICD-10-CM | POA: Diagnosis not present

## 2020-07-19 DIAGNOSIS — I10 Essential (primary) hypertension: Secondary | ICD-10-CM | POA: Diagnosis not present

## 2020-07-19 DIAGNOSIS — I25810 Atherosclerosis of coronary artery bypass graft(s) without angina pectoris: Secondary | ICD-10-CM

## 2020-07-19 DIAGNOSIS — E785 Hyperlipidemia, unspecified: Secondary | ICD-10-CM | POA: Diagnosis not present

## 2020-07-19 MED ORDER — CLOPIDOGREL BISULFATE 75 MG PO TABS: 75.0000 mg | ORAL_TABLET | Freq: Every day | ORAL | 3 refills | Status: DC
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
## 2020-07-19 NOTE — Patient Instructions (Signed)
Medication Instructions:  You can STOP Brilinta after June 8 Once you stop this medication, you will START clopidogrel (plavix) 75mg  daily  *If you need a refill on your cardiac medications before your next appointment, please call your pharmacy*  Follow-Up: At [facility], you and your health needs are our priority.  As part of our continuing mission to provide you with exceptional heart care, we have created designated Provider Care Teams.  These Care Teams include your primary Cardiologist (physician) and Advanced Practice Providers (APPs -  Physician Assistants and Nurse Practitioners) who all work together to provide you with the care you need, when you need it.  We recommend signing up for the patient portal called "MyChart".  Sign up information is provided on this After Visit Summary.  MyChart is used to connect with patients for Virtual Visits (Telemedicine).  Patients are able to view lab/test results, encounter notes, upcoming appointments, etc.  Non-urgent messages can be sent to your provider as well.   To learn more about what you can do with MyChart, go to [facility].    Your next appointment:   6 month(s)  The format for your next appointment:   In Person  Provider:   You may see [email or web address], MD or one of the following Advanced Practice Providers on your designated Care Team:    Chrystie Nose, PA-C  Azalee Course, PA-C or   Micah Flesher, Judy Pimple    Other Instructions Dr. [place] advised to try hot compresses on chest wall bruise/pain

## 2020-07-24 ENCOUNTER — Encounter: Payer: Self-pay | Admitting: Nutrition

## 2020-12-04 ENCOUNTER — Encounter: Payer: Self-pay | Admitting: Physician Assistant

## 2020-12-04 ENCOUNTER — Ambulatory Visit: Payer: Medicare PPO | Admitting: Physician Assistant

## 2020-12-04 ENCOUNTER — Other Ambulatory Visit: Payer: Self-pay

## 2020-12-04 VITALS — BP 125/76 | HR 72 | Ht 64.0 in | Wt 173.0 lb

## 2020-12-04 DIAGNOSIS — I10 Essential (primary) hypertension: Secondary | ICD-10-CM | POA: Diagnosis not present

## 2020-12-04 DIAGNOSIS — E785 Hyperlipidemia, unspecified: Secondary | ICD-10-CM | POA: Diagnosis not present

## 2020-12-04 DIAGNOSIS — E119 Type 2 diabetes mellitus without complications: Secondary | ICD-10-CM | POA: Diagnosis not present

## 2020-12-04 DIAGNOSIS — I25708 Atherosclerosis of coronary artery bypass graft(s), unspecified, with other forms of angina pectoris: Secondary | ICD-10-CM

## 2020-12-04 MED ORDER — TICAGRELOR 60 MG PO TABS
90.0000 mg | ORAL_TABLET | Freq: Two times a day (BID) | ORAL | 1 refills | Status: DC
Start: 2020-12-04 — End: 2020-12-07

## 2020-12-04 MED ORDER — ISOSORBIDE MONONITRATE ER 30 MG PO TB24: 30.0000 mg | ORAL_TABLET | Freq: Every day | ORAL | 1 refills | Status: DC

## 2020-12-04 NOTE — Patient Instructions (Addendum)
Medication Instructions:  STOP Plavix  RESTART Imdur 30 mg daily START Brilinta 60 mg 2 times a day  *If you need a refill on your cardiac medications before your next appointment, please call your pharmacy*  Lab Work: NONE ordered at this time of appointment   If you have labs (blood work) drawn today and your tests are completely normal, you will receive your results only by: MyChart Message (if you have MyChart) OR A paper copy in the mail If you have any lab test that is abnormal or we need to change your treatment, we will call you to review the results.  Testing/Procedures: NONE ordered at this time of appointment   Follow-Up: At [facility], you and your health needs are our priority.  As part of our continuing mission to provide you with exceptional heart care, we have created designated Provider Care Teams.  These Care Teams include your primary Cardiologist (physician) and Advanced Practice Providers (APPs -  Physician Assistants and Nurse Practitioners) who all work together to provide you with the care you need, when you need it.  Your next appointment:   6-8 week(s)  The format for your next appointment:   In Person  Provider:   K. [place] Hilty, MD or APP  Other Instructions

## 2020-12-04 NOTE — Progress Notes (Signed)
Cardiology Office Note:    Date:  12/06/2020   ID:  Tamara Nunez, DOB 1955/09/27, MRN [id number]  PCP:  Theodoro Kos, MD   [facility] Providers Cardiologist:  Chrystie Nose, MD     Referring MD: Theodoro Kos, MD   Chief Complaint  Patient presents with   Follow-up    Seen for Dr. Rennis Golden    History of Present Illness:    Tamara Nunez is a 65 y.o. female with a hx of CAD s/p CABG 2006 with LIMA to LAD, SVG to distal LAD, hypertension, hyperlipidemia, GERD, fibromyalgia and DM2.  Cardiac catheterization in 2009 showed widely patent grafts.  Myoview March 2014 was low risk.  Patient underwent repeat cardiac catheterization in July 2018 that demonstrated 80% proximal RCA lesion that was treated with DES, she also had atretic LIMA to LAD with 100% occlusion of mid LAD, patent SVG to distal LAD, EF 55 to 60%.  Unfortunately despite intervention, she continued to have intermittent chest discomfort.  Last cardiac catheterization performed in April 2020 demonstrated a single obstructive vessel with chronic total occlusion of mid LAD that was bypassed by SVG graft.  New 90% proximal D1 that was felt to be culprit lesion, this was treated with a drug-eluting stent.  She again underwent cardiac catheterization by Dr. Swaziland in June 2021 that demonstrated 75% distal RCA lesion that was treated with drug-eluting stent.    Her chest pain initially resolved after previous PCI in June 2021, however it has recurred.  We eventually recommended relook cath on 03/08/2020 which showed patent stents, atretic LIMA to LAD, patent SVG to LAD, no new stenosis to explain her overall symptom.  Medical therapy was recommended.  Lower extremity Doppler performed in December 2021 revealed noncompressible ABI bilaterally with tibial vessel disease but no large vessel disease.  She was last seen by Dr. Rennis Golden in May 2022, Dr. Rennis Golden was okay with her stopping the Brilinta in June and is switched to Plavix  instead.  It is recommended she continue dual antiplatelet therapy lifelong.  She has been seen by Dr. Allyson Sabal in April for lower extremity claudication symptoms, she was treated with Pletal.  Patient presents today for cardiology follow-up.  Since she came off of Brilinta in June, she has been having intermittent angina symptoms.  This is different from the previous chest hematoma/injury she had before.  The symptom does not have clear correlation with exertion.  Last episode was 2 weeks ago when the chest pain lasted for the entire day.  EKG today showed no obvious changes.  Patient mentions that Brilinta works better for her, I will switch to Plavix to the Brilinta 60 mg twice a day.  If she will use to be on the Brilinta 90 mg twice a day dosing, however it was giving her some bruises.  I will also restart her on Imdur 30 mg daily.  Otherwise, she can follow-up in 6 to 8 weeks for reassessment.  Note to triage: Patient has frequent episodes of chest pain.  In the future, please use Epic Chat to message me if she calls with chest pain so I can call her back and speak to her directly myself.  Past Medical History:  Diagnosis Date   Arthritis    "in my back" (05/06/2017)   CAD (coronary artery disease)    CABG 2006, 09/2016 DES RCA, 04/2017 DES Circ, 06/2018 DES 1st diag   Chronic back pain    Coronary artery disease  a. s/p CABG in 2006 with LIMA-LAD, SVG-dLAD. b. cath in 2009 showing patent grafts. c. 09/2016: cath with occluded LAD but patent SVG-dLAD. LIMA was atretic. pRCA with 80% stenosis --> treated with DES   Fibromyalgia    GERD (gastroesophageal reflux disease)    Hyperlipidemia    Hypertension    Type II diabetes mellitus (HCC)     Past Surgical History:  Procedure Laterality Date   ABDOMINAL HERNIA REPAIR  2018   BACK SURGERY     CARDIAC CATHETERIZATION  01/13/2008   COLONOSCOPY     CORONARY ANGIOPLASTY WITH STENT PLACEMENT  09/26/2016   Prox RCA lesion, 80 %stenosed. DES     CORONARY ARTERY BYPASS GRAFT  12/23/2004   2 vessel - LIMA to LAD (atretic), SVG to distal LAD   CORONARY ATHERECTOMY N/A 08/16/2019   Procedure: CORONARY ATHERECTOMY;  Surgeon: Swaziland, Peter M, MD;  Location: [facility] INVASIVE CV LAB;  Service: Cardiovascular;  Laterality: N/A;   CORONARY STENT INTERVENTION N/A 09/26/2016   Procedure: Coronary Stent Intervention;  Surgeon: Marykay Lex, MD;  Location: [facility] INVASIVE CV LAB;  Service: Cardiovascular;  Laterality: N/A;   CORONARY STENT INTERVENTION N/A 05/06/2017   Procedure: CORONARY STENT INTERVENTION;  Surgeon: Kathleene Hazel, MD;  Location: [facility] INVASIVE CV LAB;  Service: Cardiovascular;  Laterality: N/A;   CORONARY STENT INTERVENTION N/A 06/25/2018   Procedure: CORONARY STENT INTERVENTION;  Surgeon: Swaziland, Peter M, MD;  Location: [facility] INVASIVE CV LAB;  Service: Cardiovascular;  Laterality: N/A;   CORONARY STENT INTERVENTION N/A 08/12/2019   Procedure: CORONARY STENT INTERVENTION;  Surgeon: Lyn Records, MD;  Location: [facility] INVASIVE CV LAB;  Service: Cardiovascular;  Laterality: N/A;   CORONARY STENT INTERVENTION N/A 08/16/2019   Procedure: CORONARY STENT INTERVENTION;  Surgeon: Swaziland, Peter M, MD;  Location: [facility] INVASIVE CV LAB;  Service: Cardiovascular;  Laterality: N/A;   DOPPLER ECHOCARDIOGRAPHY  04/12/2008   EF 60%   GASTRIC BYPASS  2009   HERNIA REPAIR     LEFT HEART CATH AND CORONARY ANGIOGRAPHY N/A 05/06/2017   Procedure: LEFT HEART CATH AND CORONARY ANGIOGRAPHY;  Surgeon: Kathleene Hazel, MD;  Location: [facility] INVASIVE CV LAB;  Service: Cardiovascular;  Laterality: N/A;   LEFT HEART CATH AND CORS/GRAFTS ANGIOGRAPHY N/A 09/26/2016   Procedure: Left Heart Cath and Cors/Grafts Angiography;  Surgeon: Marykay Lex, MD;  Location: [facility] INVASIVE CV LAB;  Service: Cardiovascular;  Laterality: N/A;   LEFT HEART CATH AND CORS/GRAFTS ANGIOGRAPHY N/A 06/25/2018   Procedure: LEFT HEART CATH AND CORS/GRAFTS ANGIOGRAPHY;  Surgeon: Swaziland, Peter M, MD;   Location: [facility] INVASIVE CV LAB;  Service: Cardiovascular;  Laterality: N/A;   LEFT HEART CATH AND CORS/GRAFTS ANGIOGRAPHY N/A 08/12/2019   Procedure: LEFT HEART CATH AND CORS/GRAFTS ANGIOGRAPHY;  Surgeon: Lyn Records, MD;  Location: [facility] INVASIVE CV LAB;  Service: Cardiovascular;  Laterality: N/A;   LEFT HEART CATH AND CORS/GRAFTS ANGIOGRAPHY N/A 03/08/2020   Procedure: LEFT HEART CATH AND CORS/GRAFTS ANGIOGRAPHY;  Surgeon: Swaziland, Peter M, MD;  Location: [facility] INVASIVE CV LAB;  Service: Cardiovascular;  Laterality: N/A;   NM MYOVIEW LTD  06/03/2012   EF 71%   POSTERIOR FUSION LUMBAR SPINE  1990s   TUBAL LIGATION     VAGINAL HYSTERECTOMY     "partial"    Current Medications: Current Meds  Medication Sig   acetaminophen (TYLENOL) 500 MG tablet Take 1,000 mg by mouth every 6 (six) hours as needed for moderate pain or headache.   albuterol (PROVENTIL HFA;VENTOLIN  HFA) 108 (90 BASE) MCG/ACT inhaler Inhale 2 puffs into the lungs every 6 (six) hours as needed for wheezing.   aspirin EC 81 MG tablet Take 81 mg by mouth daily.   atorvastatin (LIPITOR) 40 MG tablet Take 1 tablet (40 mg total) by mouth daily.   cetirizine (ZYRTEC) 10 MG tablet Take 10 mg by mouth daily.   cilostazol (PLETAL) 50 MG tablet Take 1 tablet (50 mg total) by mouth 2 (two) times daily.   clopidogrel (PLAVIX) 75 MG tablet Take 1 tablet (75 mg total) by mouth daily. Start after June 8 (when you stop Brilinta)   CO-ENZYME Q-10 PO Take 200 mg by mouth daily.    cyclobenzaprine (FLEXERIL) 5 MG tablet TAKE 1 TABLET BY MOUTH THREE TIMES A DAY FOR 5 DAYS - [facility]   diclofenac sodium (VOLTAREN) 1 % GEL Apply 2 g topically 2 (two) times daily as needed (pain).   dicyclomine (BENTYL) 10 MG capsule Take 10 mg by mouth every 6 (six) hours as needed for spasms.   Docusate Calcium (STOOL SOFTENER PO) Take 1 tablet by mouth daily.   Fingerstix Lancets MISC Inject into the skin.   fluticasone (FLONASE) 50 MCG/ACT nasal spray Place 1  spray into both nostrils 2 (two) times daily.   furosemide (LASIX) 40 MG tablet Take 40 mg by mouth daily as needed.   gabapentin (NEURONTIN) 600 MG tablet Take 600 mg by mouth 3 (three) times daily.    isosorbide mononitrate (IMDUR) 30 MG 24 hr tablet Take 1 tablet (30 mg total) by mouth daily.   metoprolol tartrate (LOPRESSOR) 50 MG tablet Take 1 tablet (50 mg total) by mouth 2 (two) times daily.   nitroGLYCERIN (NITROSTAT) 0.4 MG SL tablet Place 1 tablet (0.4 mg total) under the tongue every 5 (five) minutes as needed for chest pain (up to 3 doses).   ONETOUCH ULTRA test strip Test BG qid   pantoprazole (PROTONIX) 40 MG tablet Take 40 mg by mouth daily as needed (acid reflux).    polyethylene glycol powder (GLYCOLAX/MIRALAX) powder Take 17 g by mouth daily as needed for moderate constipation or mild constipation.   ranolazine (RANEXA) 1000 MG SR tablet Take 1 tablet (1,000 mg total) by mouth 2 (two) times daily.   vitamin E 400 UNIT capsule Take 400 Units by mouth daily.   Current Facility-Administered Medications for the 12/04/20 encounter (Office Visit) with Azalee Course, PA  Medication   sodium chloride flush (NS) 0.9 % injection 3 mL     Allergies:   Codeine, Other, Ciprofloxacin, Valsartan, Adhesive [tape], and Sulfa antibiotics   Social History   Socioeconomic History   Marital status: Married    Spouse name: Not on file   Number of children: Not on file   Years of education: Not on file   Highest education level: Not on file  Occupational History   Not on file  Tobacco Use   Smoking status: Never   Smokeless tobacco: Never  Vaping Use   Vaping Use: Never used  Substance and Sexual Activity   Alcohol use: No   Drug use: No   Sexual activity: Not on file  Other Topics Concern   Not on file  Social History Narrative   Not on file   Social Determinants of Health   Financial Resource Strain: Not on file  Food Insecurity: Not on file  Transportation Needs: Not on file   Physical Activity: Not on file  Stress: Not on file  Social Connections: Not on file     Family History: The patient's family history includes Cancer - Lung in her father; Heart disease in her mother.  ROS:   Please see the history of present illness.     All other systems reviewed and are negative.  EKGs/Labs/Other Studies Reviewed:    The following studies were reviewed today:  Cath 03/08/2020 Non-stenotic Ost 1st Diag-2 lesion was previously treated. Non-stenotic Ost 1st Diag-1 lesion was previously treated. Prox LAD to Mid LAD lesion is 100% stenosed. Prox LAD-2 lesion is 100% stenosed. Non-stenotic Prox LAD-1 lesion was previously treated. Ost 2nd Mrg lesion is 70% stenosed. Prox Cx lesion is 40% stenosed. Mid Cx lesion is 20% stenosed. Non-stenotic Ost Cx lesion was previously treated. Prox RCA lesion is 10% stenosed. Prox RCA to Mid RCA lesion is 30% stenosed. LIMA. Origin to Mid Graft lesion is 99% stenosed. SVG and is normal in caliber. Mid RCA to Dist RCA lesion is 35% stenosed. The left ventricular systolic function is normal. LV end diastolic pressure is normal. The left ventricular ejection fraction is 55-65% by visual estimate.   1. CAD with continued patency of prior stents. No new stenoses to explain her recent symptoms. 2. Patent SVG to LAD 3. Atretic LIMA to the LAD which is supplied by the SVG 4. Normal LV function 5. Normal LVEDP   Plan: continue medical therapy.  EKG:  EKG is ordered today.  The ekg ordered today demonstrates sinus rhythm with right bundle branch block  Recent Labs: 01/11/2020: TSH 0.80 03/05/2020: BUN 17; Creatinine, Ser 0.86; Hemoglobin 11.3; Platelets 407; Potassium 4.5; Sodium 139  Recent Lipid Panel    Component Value Date/Time   CHOL 136 01/11/2020 0000   CHOL 131 07/05/2018 1152   TRIG 61 01/11/2020 0000   HDL 83 (A) 01/11/2020 0000   HDL 83 07/05/2018 1152   CHOLHDL 1.6 07/05/2018 1152   CHOLHDL 1.8 04/15/2010  0400   VLDL 3 04/15/2010 0400   LDLCALC 41 01/11/2020 0000   LDLCALC 38 07/05/2018 1152     Risk Assessment/Calculations:           Physical Exam:    VS:  BP 125/76   Pulse 72   Ht 5\' 4"  (1.626 m)   Wt 173 lb (78.5 kg)   SpO2 99%   BMI 29.70 kg/m     Wt Readings from Last 3 Encounters:  12/04/20 173 lb (78.5 kg)  07/19/20 171 lb 6.4 oz (77.7 kg)  07/17/20 174 lb (78.9 kg)     GEN:  Well nourished, well developed in no acute distress HEENT: Normal NECK: No JVD; No carotid bruits LYMPHATICS: No lymphadenopathy CARDIAC: RRR, no murmurs, rubs, gallops RESPIRATORY:  Clear to auscultation without rales, wheezing or rhonchi  ABDOMEN: Soft, non-tender, non-distended MUSCULOSKELETAL:  No edema; No deformity  SKIN: Warm and dry NEUROLOGIC:  Alert and oriented x 3 PSYCHIATRIC:  Normal affect   ASSESSMENT:    1. Coronary artery disease involving coronary bypass graft of native heart with other forms of angina pectoris (HCC)   2. Primary hypertension   3. Hyperlipidemia LDL goal <70   4. Controlled type 2 diabetes mellitus without complication, without long-term current use of insulin (HCC)    PLAN:    In order of problems listed above:  CAD s/p CABG: Continue to have intermittent chest discomfort since June after her Brilinta was switched to Plavix.  She wished to go back to Brilinta again however was concerned about bruising  associated with the 90 mg tablet.  I decided to switch her back to Brilinta 60 mg twice a day dosing as a maintenance therapy.  I also added 30 mg daily of Imdur to her medication management to help with angina.  Last cardiac catheterization in December 2021 did not show any obvious culprit lesion  Hypertension: Blood pressure stable  Hyperlipidemia: On Lipitor  DM2: Managed by primary care provider         Medication Adjustments/Labs and Tests Ordered: Current medicines are reviewed at length with the patient today.  Concerns regarding  medicines are outlined above.  Orders Placed This Encounter  Procedures   EKG 12-Lead   Meds ordered this encounter  Medications   ticagrelor (BRILINTA) 60 MG TABS tablet    Sig: Take 1.5 tablets (90 mg total) by mouth 2 (two) times daily. OK to stop after June 8 (and then start Plavix)    Dispense:  270 tablet    Refill:  1   isosorbide mononitrate (IMDUR) 30 MG 24 hr tablet    Sig: Take 1 tablet (30 mg total) by mouth daily.    Dispense:  90 tablet    Refill:  1    Patient Instructions  Medication Instructions:  STOP Plavix  RESTART Imdur 30 mg daily START Brilinta 60 mg 2 times a day  *If you need a refill on your cardiac medications before your next appointment, please call your pharmacy*  Lab Work: NONE ordered at this time of appointment   If you have labs (blood work) drawn today and your tests are completely normal, you will receive your results only by: MyChart Message (if you have MyChart) OR A paper copy in the mail If you have any lab test that is abnormal or we need to change your treatment, we will call you to review the results.  Testing/Procedures: NONE ordered at this time of appointment   Follow-Up: At [facility], you and your health needs are our priority.  As part of our continuing mission to provide you with exceptional heart care, we have created designated Provider Care Teams.  These Care Teams include your primary Cardiologist (physician) and Advanced Practice Providers (APPs -  Physician Assistants and Nurse Practitioners) who all work together to provide you with the care you need, when you need it.  Your next appointment:   6-8 week(s)  The format for your next appointment:   In Person  Provider:   K. Italy Hilty, MD or APP  Other Instructions    Signed, Azalee Course, [place]  12/06/2020 11:25 PM    [facility]

## 2020-12-06 ENCOUNTER — Encounter: Payer: Self-pay | Admitting: Physician Assistant

## 2020-12-07 ENCOUNTER — Telehealth: Payer: Self-pay | Admitting: Internal Medicine

## 2020-12-07 MED ORDER — TICAGRELOR 60 MG PO TABS: 60.0000 mg | ORAL_TABLET | Freq: Two times a day (BID) | ORAL | 1 refills | Status: DC

## 2020-12-07 NOTE — Addendum Note (Signed)
Addended by: Dorris Fetch on: 12/07/2020 04:55 PM   Modules accepted: Orders

## 2020-12-07 NOTE — Telephone Encounter (Signed)
Called [employer] and spoke with [place]. Informed him patient should be taking 60 mg 2 times of Brilinta and the Plavix was stopped on 12/04/20. He transferred me to Pharmacist Baxter Hire and she verbally verified the directions of the brilinta as 60 mg (1 tablet) by mouth 2 times a day. She thanked me for calling to clarify the Rx.

## 2020-12-07 NOTE — Telephone Encounter (Signed)
Pt c/o medication issue:  1. Name of Medication: ticagrelor (BRILINTA) 60 MG TABS tablet   2. How are you currently taking this medication (dosage and times per day)? Take 1.5 tablets (90 mg total) by mouth 2 (two) times daily. OK to stop after June 8 (and then start Plavix)  3. Are you having a reaction (difficulty breathing--STAT)? no  4. What is your medication issue? [facility] would like clarification on how pt should be taking this medicine... please advise

## 2020-12-12 ENCOUNTER — Other Ambulatory Visit: Payer: Self-pay

## 2021-01-11 ENCOUNTER — Telehealth: Payer: Self-pay | Admitting: "Endocrinology

## 2021-01-11 NOTE — Telephone Encounter (Signed)
Patient asked [place] on Thursday to fax her lab order to fax # [phone]. Attempted multiple times, kept saying no answer. If patient calls about this, we need another fax number.

## 2021-01-15 LAB — COMPREHENSIVE METABOLIC PANEL
ALT: 26 IU/L (ref 0–32)
AST: 35 IU/L (ref 0–40)
Albumin/Globulin Ratio: 1.6 (ref 1.2–2.2)
Albumin: 4.3 g/dL (ref 3.8–4.8)
Alkaline Phosphatase: 115 IU/L (ref 44–121)
BUN/Creatinine Ratio: 10 — ABNORMAL LOW (ref 12–28)
BUN: 8 mg/dL (ref 8–27)
Bilirubin Total: 0.4 mg/dL (ref 0.0–1.2)
CO2: 24 mmol/L (ref 20–29)
Calcium: 9.3 mg/dL (ref 8.7–10.3)
Chloride: 105 mmol/L (ref 96–106)
Creatinine, Ser: 0.84 mg/dL (ref 0.57–1.00)
Globulin, Total: 2.7 g/dL (ref 1.5–4.5)
Glucose: 87 mg/dL (ref 70–99)
Potassium: 4.5 mmol/L (ref 3.5–5.2)
Sodium: 143 mmol/L (ref 134–144)
Total Protein: 7 g/dL (ref 6.0–8.5)
eGFR: 77 mL/min/{1.73_m2} (ref >59)

## 2021-01-15 LAB — TSH: TSH: 1.44 u[IU]/mL (ref 0.450–4.500)

## 2021-01-15 LAB — T4, FREE: Free T4: 1.15 ng/dL (ref 0.82–1.77)

## 2021-01-15 LAB — VITAMIN D 25 HYDROXY (VIT D DEFICIENCY, FRACTURES): Vit D, 25-Hydroxy: 27.9 ng/mL — ABNORMAL LOW (ref 30.0–100.0)

## 2021-01-16 ENCOUNTER — Ambulatory Visit: Payer: Medicare PPO | Admitting: Nutrition

## 2021-01-17 ENCOUNTER — Encounter: Payer: Medicare PPO | Attending: "Endocrinology | Admitting: Nutrition

## 2021-01-17 ENCOUNTER — Encounter: Payer: Self-pay | Admitting: "Endocrinology

## 2021-01-17 ENCOUNTER — Ambulatory Visit (INDEPENDENT_AMBULATORY_CARE_PROVIDER_SITE_OTHER): Payer: Medicare PPO | Admitting: "Endocrinology

## 2021-01-17 ENCOUNTER — Encounter: Payer: Self-pay | Admitting: Nutrition

## 2021-01-17 ENCOUNTER — Other Ambulatory Visit: Payer: Self-pay

## 2021-01-17 VITALS — BP 118/76 | HR 56 | Ht 64.0 in | Wt 178.0 lb

## 2021-01-17 VITALS — Ht 64.0 in | Wt 178.0 lb

## 2021-01-17 DIAGNOSIS — E782 Mixed hyperlipidemia: Secondary | ICD-10-CM | POA: Insufficient documentation

## 2021-01-17 DIAGNOSIS — E119 Type 2 diabetes mellitus without complications: Secondary | ICD-10-CM | POA: Insufficient documentation

## 2021-01-17 DIAGNOSIS — I10 Essential (primary) hypertension: Secondary | ICD-10-CM | POA: Insufficient documentation

## 2021-01-17 DIAGNOSIS — Z951 Presence of aortocoronary bypass graft: Secondary | ICD-10-CM | POA: Insufficient documentation

## 2021-01-17 DIAGNOSIS — E559 Vitamin D deficiency, unspecified: Secondary | ICD-10-CM

## 2021-01-17 DIAGNOSIS — E1159 Type 2 diabetes mellitus with other circulatory complications: Secondary | ICD-10-CM | POA: Diagnosis not present

## 2021-01-17 MED ORDER — CHOLECALCIFEROL 50 MCG (2000 UT) PO CAPS: 1.0000 | ORAL_CAPSULE | Freq: Every day | ORAL | 1 refills | Status: DC

## 2021-01-17 NOTE — Patient Instructions (Addendum)
Goals Keep up the great job. Try to do chair exercises daily. Increase water intake to 4 bottles per day Focus on more plant based foods.

## 2021-01-17 NOTE — Progress Notes (Signed)
07/17/2020, 10:29 AM            Endocrinology follow-up note  Subjective:    Patient ID: Tamara Nunez, female    DOB: Mar 06, 1956.  CADYNCE Nunez is being seen in follow-up for type 2 diabetes which is currently controlled on diet and exercise with A1c of 6.3%.    PMD:   Tamara Kos, MD.   Past Medical History:  Diagnosis Date   Arthritis    "in my back" (05/06/2017)   CAD (coronary artery disease)    CABG 2006, 09/2016 DES RCA, 04/2017 DES Circ, 06/2018 DES 1st diag   Chronic back pain    Coronary artery disease    a. s/p CABG in 2006 with LIMA-LAD, SVG-dLAD. b. cath in 2009 showing patent grafts. c. 09/2016: cath with occluded LAD but patent SVG-dLAD. LIMA was atretic. pRCA with 80% stenosis --> treated with DES   Fibromyalgia    GERD (gastroesophageal reflux disease)    Hyperlipidemia    Hypertension    Type II diabetes mellitus (HCC)     Past Surgical History:  Procedure Laterality Date   ABDOMINAL HERNIA REPAIR  2018   BACK SURGERY     CARDIAC CATHETERIZATION  01/13/2008   COLONOSCOPY     CORONARY ANGIOPLASTY WITH STENT PLACEMENT  09/26/2016   Prox RCA lesion, 80 %stenosed. DES    CORONARY ARTERY BYPASS GRAFT  12/23/2004   2 vessel - LIMA to LAD (atretic), SVG to distal LAD   CORONARY ATHERECTOMY N/A 08/16/2019   Procedure: CORONARY ATHERECTOMY;  Surgeon: Swaziland, Peter M, MD;  Location: [facility] INVASIVE CV LAB;  Service: Cardiovascular;  Laterality: N/A;   CORONARY STENT INTERVENTION N/A 09/26/2016   Procedure: Coronary Stent Intervention;  Surgeon: Marykay Lex, MD;  Location: [facility] INVASIVE CV LAB;  Service: Cardiovascular;  Laterality: N/A;   CORONARY STENT INTERVENTION N/A 05/06/2017   Procedure: CORONARY STENT INTERVENTION;  Surgeon: Kathleene Hazel, MD;  Location: [facility] INVASIVE CV LAB;  Service: Cardiovascular;  Laterality: N/A;   CORONARY STENT INTERVENTION N/A  06/25/2018   Procedure: CORONARY STENT INTERVENTION;  Surgeon: Swaziland, Peter M, MD;  Location: [facility] INVASIVE CV LAB;  Service: Cardiovascular;  Laterality: N/A;   CORONARY STENT INTERVENTION N/A 08/12/2019   Procedure: CORONARY STENT INTERVENTION;  Surgeon: Lyn Records, MD;  Location: [facility] INVASIVE CV LAB;  Service: Cardiovascular;  Laterality: N/A;   CORONARY STENT INTERVENTION N/A 08/16/2019   Procedure: CORONARY STENT INTERVENTION;  Surgeon: Swaziland, Peter M, MD;  Location: [facility] INVASIVE CV LAB;  Service: Cardiovascular;  Laterality: N/A;   DOPPLER ECHOCARDIOGRAPHY  04/12/2008   EF 60%   GASTRIC BYPASS  2009   HERNIA REPAIR     LEFT HEART CATH AND CORONARY ANGIOGRAPHY N/A 05/06/2017   Procedure: LEFT HEART CATH AND CORONARY ANGIOGRAPHY;  Surgeon: Kathleene Hazel, MD;  Location: [facility] INVASIVE CV LAB;  Service: Cardiovascular;  Laterality: N/A;   LEFT HEART CATH AND CORS/GRAFTS ANGIOGRAPHY N/A 09/26/2016   Procedure: Left Heart Cath and Cors/Grafts Angiography;  Surgeon: Marykay Lex, MD;  Location: [facility] INVASIVE CV LAB;  Service: Cardiovascular;  Laterality: N/A;   LEFT HEART CATH AND CORS/GRAFTS ANGIOGRAPHY N/A 06/25/2018   Procedure: LEFT HEART CATH AND CORS/GRAFTS ANGIOGRAPHY;  Surgeon: Swaziland, Peter M, MD;  Location: [facility] INVASIVE CV LAB;  Service: Cardiovascular;  Laterality: N/A;   LEFT HEART CATH AND CORS/GRAFTS ANGIOGRAPHY N/A 08/12/2019   Procedure: LEFT HEART CATH AND CORS/GRAFTS ANGIOGRAPHY;  Surgeon: Lyn Records, MD;  Location: [facility] INVASIVE CV LAB;  Service: Cardiovascular;  Laterality: N/A;   LEFT HEART CATH AND CORS/GRAFTS ANGIOGRAPHY N/A 03/08/2020   Procedure: LEFT HEART CATH AND CORS/GRAFTS ANGIOGRAPHY;  Surgeon: Swaziland, Peter M, MD;  Location: [facility] INVASIVE CV LAB;  Service: Cardiovascular;  Laterality: N/A;   NM MYOVIEW LTD  06/03/2012   EF 71%   POSTERIOR FUSION LUMBAR SPINE  1990s   TUBAL LIGATION     VAGINAL HYSTERECTOMY     "partial"    Social History   Socioeconomic  History   Marital status: Married    Spouse name: Not on file   Number of children: Not on file   Years of education: Not on file   Highest education level: Not on file  Occupational History   Not on file  Tobacco Use   Smoking status: Never Smoker   Smokeless tobacco: Never Used  Vaping Use   Vaping Use: Never used  Substance and Sexual Activity   Alcohol use: No   Drug use: No   Sexual activity: Not on file  Other Topics Concern   Not on file  Social History Narrative   Not on file   Social Determinants of Health   Financial Resource Strain: Not on file  Food Insecurity: Not on file  Transportation Needs: Not on file  Physical Activity: Not on file  Stress: Not on file  Social Connections: Not on file    Family History  Problem Relation Age of Onset   Heart disease Mother    Cancer - Lung Father     Outpatient Encounter Medications as of 07/17/2020  Medication Sig   cyclobenzaprine (FLEXERIL) 5 MG tablet TAKE 1 TABLET BY MOUTH THREE TIMES A DAY FOR 5 DAYS - [facility] [place] D/c.   acetaminophen (TYLENOL) 500 MG tablet Take 1,000 mg by mouth every 6 (six) hours as needed for moderate pain or headache.   albuterol (PROVENTIL HFA;VENTOLIN HFA) 108 (90 BASE) MCG/ACT inhaler Inhale 2 puffs into the lungs every 6 (six) hours as needed for wheezing.   amLODipine (NORVASC) 2.5 MG tablet Take 1 tablet (2.5 mg total) by mouth daily.   aspirin EC 81 MG tablet Take 81 mg by mouth daily.   atorvastatin (LIPITOR) 40 MG tablet Take 1 tablet (40 mg total) by mouth daily.   cetirizine (ZYRTEC) 10 MG tablet Take 10 mg by mouth daily.   cilostazol (PLETAL) 50 MG tablet Take 1 tablet (50 mg total) by mouth 2 (two) times daily.   CO-ENZYME Q-10 PO Take 200 mg by mouth daily.    diclofenac sodium (VOLTAREN) 1 % GEL Apply 2 g topically 2 (two) times daily as needed (pain).   dicyclomine (BENTYL) 10 MG capsule Take 10 mg by mouth every 6 (six) hours as needed for spasms.   Docusate Calcium  (STOOL SOFTENER PO) Take 1 tablet by mouth daily.   Fingerstix Lancets MISC Inject into the skin.   fluticasone (FLONASE) 50 MCG/ACT nasal spray Place 1 spray into both nostrils 2 (two) times daily.   furosemide (LASIX) 40 MG tablet TAKE 1 TABLET BY MOUTH  DAILY (Patient taking differently: Take 40 mg by mouth daily.)   gabapentin (NEURONTIN) 600 MG tablet Take 600 mg by mouth 3 (three) times daily.    metoprolol tartrate (LOPRESSOR) 50 MG tablet Take 1 tablet (50 mg total) by mouth 2 (two) times daily.   nitroGLYCERIN (NITROSTAT) 0.4 MG SL tablet Place 1 tablet (0.4 mg total) under the tongue every 5 (five) minutes as needed for chest pain (up to 3 doses).   ONETOUCH ULTRA test strip Test BG qid   pantoprazole (PROTONIX) 40 MG tablet Take 40 mg by mouth daily as needed (acid reflux).    polyethylene glycol powder (GLYCOLAX/MIRALAX) powder Take 17 g by mouth daily as needed for moderate constipation or mild constipation.   ranolazine (RANEXA) 1000 MG SR tablet Take 1 tablet (1,000 mg total) by mouth 2 (two) times daily.   ticagrelor (BRILINTA) 90 MG TABS tablet Take 1 tablet (90 mg total) by mouth 2 (two) times daily.   vitamin B-12 (CYANOCOBALAMIN) 1000 MCG tablet Take 1,000 mcg by mouth daily.   vitamin E 400 UNIT capsule Take 400 Units by mouth daily.   [DISCONTINUED] Vitamin D, Ergocalciferol, (DRISDOL) 1.25 MG (50000 UNIT) CAPS capsule Take 1 capsule (50,000 Units total) by mouth every 7 (seven) days.   Facility-Administered Encounter Medications as of 07/17/2020  Medication   sodium chloride flush (NS) 0.9 % injection 3 mL    ALLERGIES: Allergies  Allergen Reactions   Codeine Hives and Shortness Of Breath        Other Anaphylaxis    Nuts   Ciprofloxacin Itching   Valsartan Rash   Adhesive [Tape] Itching    "clear tape" used after surgery."took all her skin off".  Paper take is ok   Sulfa Antibiotics Nausea Only    VACCINATION STATUS:  There is no immunization history on  file for this patient.  Diabetes She presents for her follow-up diabetic visit. She has type 2 diabetes mellitus. Onset time: She was diagnosed at approximate age of 34 years. Her disease course has been stable. There are no hypoglycemic associated symptoms. Pertinent negatives for hypoglycemia include no confusion, headaches, pallor or seizures. Pertinent negatives for diabetes include no chest pain, no fatigue, no polydipsia, no polyphagia and no polyuria. There are no hypoglycemic complications. Symptoms are stable. Diabetic complications include heart disease. Risk factors for coronary artery disease include dyslipidemia, diabetes mellitus, hypertension, sedentary lifestyle, post-menopausal, obesity and family history. When asked about current treatments, none were reported. Her weight is fluctuating minimally. She is following a generally unhealthy diet. When asked about meal planning, she reported none. She has not had a previous visit with a dietitian. She never participates in exercise. There is no change in her home blood glucose trend. Her overall blood glucose range is 130-140 mg/dl. (She presents with controlled glycemic profile.  Her previsit labs show a1c of 6% . Her PCP offered her treatment with starlix 60 mg po TID. She did have hypoglycemia episodes to as low as 56 randomly. ) An ACE inhibitor/angiotensin II receptor blocker is being taken. Eye exam is current.  Hyperlipidemia This is a chronic problem. The current episode started more than 1 year ago. The problem is controlled. Exacerbating diseases include diabetes. Pertinent negatives include no chest pain, myalgias or shortness of breath. Risk factors for coronary artery disease include dyslipidemia, diabetes mellitus, hypertension, a sedentary lifestyle and post-menopausal.  Hypertension This is a chronic problem. The problem is controlled. Pertinent negatives include no chest pain, headaches, palpitations or  shortness of breath. Risk  factors for coronary artery disease include dyslipidemia, diabetes mellitus, sedentary lifestyle, family history and post-menopausal state. Past treatments include ACE inhibitors. Hypertensive end-organ damage includes CAD/MI.   Review of systems: Limited as above.  Objective:    BP (!) 146/74   Pulse 72   Ht  (1.676 m)   Wt 174 lb (78.9 kg)   BMI 28.08 kg/m   Wt Readings from Last 3 Encounters:  07/17/20 174 lb (78.9 kg)  07/17/20 174 lb (78.9 kg)  07/04/20 170 lb (77.1 kg)     Physical Exam- Limited  Constitutional:  Body mass index is 28.08 kg/m. , not in acute distress, normal state of mind   CMP     Component Value Date/Time   NA 139 03/05/2020 1207   K 4.5 03/05/2020 1207   CL 100 03/05/2020 1207   CO2 27 03/05/2020 1207   GLUCOSE 79 03/05/2020 1207   GLUCOSE 88 02/19/2020 1638   BUN 17 03/05/2020 1207   CREATININE 0.86 03/05/2020 1207   CREATININE 0.94 10/18/2018 1000   CALCIUM 9.5 03/05/2020 1207   PROT 6.7 08/17/2019 1005   PROT 7.1 01/01/2018 0900   ALBUMIN 3.2 (L) 08/17/2019 1005   ALBUMIN 4.2 01/01/2018 0900   AST 52 (H) 08/17/2019 1005   ALT 26 08/17/2019 1005   ALKPHOS 91 08/17/2019 1005   BILITOT 0.5 08/17/2019 1005   BILITOT 0.3 01/01/2018 0900   GFRNONAA 71 03/05/2020 1207   GFRNONAA >60 02/19/2020 1638   GFRNONAA 64 10/18/2018 1000   GFRAA 82 03/05/2020 1207   GFRAA 74 10/18/2018 1000     Diabetic Labs (most recent): Lab Results  Component Value Date   HGBA1C 5.8 07/17/2020   HGBA1C 6.1 01/11/2020   HGBA1C 6.3 04/27/2019     Lipid Panel ( most recent) Lipid Panel     Component Value Date/Time   CHOL 136 01/11/2020 0000   CHOL 131 07/05/2018 1152   TRIG 61 01/11/2020 0000   HDL 83 (A) 01/11/2020 0000   HDL 83 07/05/2018 1152   CHOLHDL 1.6 07/05/2018 1152   CHOLHDL 1.8 04/15/2010 0400   VLDL 3 04/15/2010 0400   LDLCALC 41 01/11/2020 0000   LDLCALC 38 07/05/2018 1152      Lab Results  Component Value Date   TSH 0.80  01/11/2020   TSH 1.16 04/27/2019   TSH 1.47 05/12/2018   TSH 1.540 01/01/2018   TSH 1.268 04/14/2010       Assessment & Plan:   1. DM type 2 causing vascular disease (HCC)   - NAYELIZ HIPP has currently controlled  type 2 DM since  65 years of age. She presents with controlled glycemic profile.  Her previsit labs show a1c of 6% . Her PCP offered her treatment with starlix 60 mg po TID. She did have hypoglycemia episodes to as low as 56 randomly.   -her diabetes is complicated by coronary artery disease, sedentary life, and she remains at a high risk for more acute and chronic complications which include CAD, CVA, CKD, retinopathy, and neuropathy. These are all discussed in detail with her.  - I have counseled her on diet management and weight loss, by adopting a carbohydrate restricted/protein rich diet.  - she acknowledges that there is a room for improvement in her food and drink choices. - Suggestion is made for her to avoid simple carbohydrates  from her diet including Cakes, Sweet Desserts, Ice Cream, Soda (diet and regular), Sweet Tea,  Candies, Chips, Cookies, Store Bought Juices, Alcohol in Excess of  1-2 drinks a day, Artificial Sweeteners,  Coffee Creamer, and "Sugar-free" Products, Lemonade. This will help patient to have more stable blood glucose profile and potentially avoid unintended weight gain.   - I encouraged her to switch to  unprocessed or minimally processed complex starch and increased protein intake (animal or plant source), fruits, and vegetables.  - she is advised to stick to a routine mealtimes to eat 3 meals  a day and avoid unnecessary snacks ( to snack only to correct hypoglycemia).  - I have approached her with the following individualized plan to manage  her diabetes and patient agrees:   - in light of her presentation with tight glycemic profile and hypoglycemia episodes, she is advised to stop Starlix.  Her a1c is between 5.8% - 6%. She will not need  treatment for diabetes for now. She is assured that those rare readings of  "high " readings are not sustained and are largely post prandial and may be error effects. This is after taking into account her hemoglobin around 11 g/dl.  If she needs intervention, she will be considered for low dose metformin. - Patient specific target  A1c;  LDL, HDL, Triglycerides, were discussed in detail.  2) Blood Pressure /Hypertension:  -Her blood pressure is controlled to target. -   she is advised to continue her current medications including  Ramipril 5 mg p.o. daily with breakfast .  3) Lipids/Hyperlipidemia:   Review of her recent lipid panel showed controlled LDL of 41.  In light of her cardiac history,   she  is advised to continue simvastatin 20 mg p.o. daily at bedtime.  Side effects and precautions discussed with her.     4)  Weight/Diet: Her BMI is 30.5  -she is  a candidate for some  major weight loss, I discussed with her the fact that loss of 5 - 10% of her  current body weight will have the most impact on her diabetes management.  CDE Consult will be initiated . Exercise, and detailed carbohydrates information provided  -  detailed on discharge instructions.  5) vitamin D deficiency: -Her 25-hydroxy vitamin D has improved to 31.1 .  She is advised to finish her current supplies of vitamin D2 50,000 units weekly and maintain with vitamin D3 1000 units daily.    6) Chronic Care/Health Maintenance:  -she  is on ACEI/ARB and Statin medications and  is encouraged to initiate and continue to follow up with Ophthalmology, Dentist,  Podiatrist at least yearly or according to recommendations, and advised to   stay away from smoking. I have recommended yearly flu vaccine and pneumonia vaccine at least every 5 years; moderate intensity exercise for up to 150 minutes weekly; and  sleep for at least 7 hours a day.  - she is  advised to maintain close follow up with Tamara Kos, MD for primary care  needs, as well as her other providersfor optimal and coordinated care.   I spent 41 minutes in the care of the patient today including review of labs from CMP, Lipids, Thyroid Function, Hematology (current and previous including abstractions from other facilities); face-to-face time discussing  her blood glucose readings/logs, discussing hypoglycemia and hyperglycemia episodes and symptoms, medications doses, her options of short and long term treatment based on the latest standards of care / guidelines;  discussion about incorporating lifestyle medicine;  and documenting the encounter.    Please refer to Patient  Instructions for Blood Glucose Monitoring and Insulin/Medications Dosing Guide"  in media tab for additional information. Please  also refer to " Patient Self Inventory" in the Media  tab for reviewed elements of pertinent patient history.  Derwood Kaplan participated in the discussions, expressed understanding, and voiced agreement with the above plans.  All questions were answered to her satisfaction. she is encouraged to contact clinic should she have any questions or concerns prior to her return visit.   Follow up plan: - Return in about 6 months (around 01/17/2021) for F/U with Pre-visit Labs, A1c -NV, ABI in Office NV.  Marquis Lunch, MD [facility] [address] [place], [place] [address] Phone: [phone]  Fax: [phone]    07/17/2020, 10:29 AM  This note was partially dictated with voice recognition software. Similar sounding words can be transcribed inadequately or may not  be corrected upon review.

## 2021-01-17 NOTE — Progress Notes (Signed)
  Medical Nutrition Therapy:  Appt start time: 0830   end time: 1000 Assessment:  Primary concerns today: Diabetes Type 2, controlled,. Followup    Last A1C 5.8% 5/22.  Saw Dr. Dorris Fetch today.  Changes she has made: FBS 134 mg/dl. She is eating more vegetables and more fruit. Has been walking some. Limited physical activity. Having left knee issues. Can't stand on it sometimes. Going to [facility] next week.  Has eaten some of the healthy choice meals. Eating her fruits Diet controlled DM for now. She met her goals from last visit with the exception of water. She notes she can't drink that much water.  CMP Latest Ref Rng & Units 01/14/2021 03/05/2020 02/19/2020  Glucose 70 - 99 mg/dL 87 79 88  BUN 8 - 27 mg/dL _0 Creatinine 0.57 - 1.00 mg/dL 0.84 0.86 0.76  Sodium 134 - 144 mmol/L 143 139 143  Potassium 3.5 - 5.2 mmol/L 4.5 4.5 4.4  Chloride 96 - 106 mmol/L 105 100 110  CO2 20 - 29 mmol/L _1 Calcium 8.7 - 10.3 mg/dL 9.3 9.5 9.1  Total Protein 6.0 - 8.5 g/dL 7.0 - -  Total Bilirubin 0.0 - 1.2 mg/dL 0.4 - -  Alkaline Phos 44 - 121 IU/L 115 - -  AST 0 - 40 IU/L 35 - -  ALT 0 - 32 IU/L 26 - -     Lab Results  Component Value Date   HGBA1C 5.8 07/17/2020   Preferred Learning Style:    No preference indicated   Learning Readiness:  Ready Change in progress   MEDICATIONS:    DIETARY INTAKE: B) Coffee Oatmeal bar Apple L) Chef salad, [place], water D) string beans/onion, salisbury steak, mac/cheese, water   Usual physical activity: ADL  Estimated energy needs: 1200  calories 135 g carbohydrates 90 g protein 33 g fat  Progress Towards Goal(s):  In progress.   Nutritional Diagnosis:  NB-1.1 Food and nutrition-related knowledge deficit As related to PreDm .  As evidenced by A1C 6.35.    Intervention: Lifestyle Medicine - Whole Food, Plant Predominant Nutrition is highly recommended: Eat Plenty of vegetables, Mushrooms, fruits, Legumes, Whole Grains,  Nuts, seeds in lieu of processed meats, processed snacks/pastries red meat, poultry, eggs.    -It is better to avoid simple carbohydrates including: Cakes, Sweet Desserts, Ice Cream, Soda (diet and regular), Sweet Tea, Candies, Chips, Cookies, Store Bought Juices, Alcohol in Excess of  1-2 drinks a day, Lemonade,  Artificial Sweeteners, Doughnuts, Coffee Creamers, "Sugar-free" Products, etc, etc.  This is not a complete list.....  Exercise: If you are able: 30 -60 minutes a day ,4 days a week, or 150 minutes a week.  The longer the better.  Combine stretch, strength, and aerobic activities.  If you were told in the past that you have high risk for cardiovascular diseases, you may seek evaluation by your heart doctor prior to initiating moderate to intense exercise programs   Goals Keep up the great job. Try to do chair exercises daily. Increase water intake to 4 bottles per day Focus on more plant based foods.  Teaching Method Utilized:  Visual Auditory Hands on  Handouts given during visit include: ACLM Plant based meal plate  Barriers to learning/adherence to lifestyle change: none  Demonstrated degree of understanding via:  Teach Back   Monitoring/Evaluation:  Dietary intake, exercise,  , and body weight in  3 month(s).

## 2021-01-17 NOTE — Patient Instructions (Signed)

## 2021-01-30 ENCOUNTER — Telehealth: Payer: Self-pay

## 2021-01-30 NOTE — Telephone Encounter (Signed)
Hi Dr. Rennis Golden,  Tamara Nunez is planning on having a knee replacement soon and will need to hold her antiplatelet therapy. She has a history of CAD s/p CABG in 2006 with multiple subsequent PCI, most recently in 09/2019. Last cath in 02/2020 showed stable disease with patent stents and no new stenoses. She was last seen by [facility] in 11/2020 at which time she reported intermittent chest pain and was started on Imdur. Can you please comment on how long patient can hold both Aspirin and Brilinta prior to knee surgery?  Please route response back to P CV DIV PREOP.  Thank you! Tamara Nunez

## 2021-01-30 NOTE — Telephone Encounter (Signed)
   [facility] Pre-operative Risk Assessment    Patient Name: Tamara Nunez  DOB: 09-29-1955 MRN: [id number]   Request for surgical clearance:  What type of surgery is being performed  Left total knee arthroplasty  When is this surgery scheduled  TBD  What type of clearance is required  Both  Are there any medications that need to be held prior to surgery and how long Aspirin and Brilinta   Practice name and name of physician performing surgery [facility]  Dr.Matthew Charlann Boxer   What is the office phone number  [phone]   7.   What is the office fax number  [phone]  8.   Anesthesia type Spinal    Neoma Laming 01/30/2021, 3:43 PM  _________________________________________________________________   (provider comments below)

## 2021-02-04 NOTE — Telephone Encounter (Signed)
Left VM.  Brilinta/ASA hold has been addressed.

## 2021-02-04 NOTE — Telephone Encounter (Signed)
Ok to hold Brilinta for 5 days and aspirin 7 days before surgery - restart as soon as practical after.  Dr Rexene Edison

## 2021-02-05 NOTE — Telephone Encounter (Signed)
Tamara Nunez is returning Tamara Nunez's call.

## 2021-02-05 NOTE — Telephone Encounter (Signed)
   Name: KIMIE PIDCOCK  DOB: 19-Jan-1956  MRN: [id number]   Primary Cardiologist: Chrystie Nose, MD  Chart reviewed as part of pre-operative protocol coverage. Patient was contacted 02/05/2021 in reference to pre-operative risk assessment for pending surgery as outlined below.  RYIAN LYNDE was last seen on 12/04/20 by Dr. Rennis Golden.  Since that day, AVIVA WOLFER has done well.  She can complete 4.0 METS without angina (climb flight of stairs at church). She is primarily limited by knee pain. She has a 0.9% risk of MACE according to the RCRI.   Per Dr. Rennis Golden: Ok to hold Brilinta for 5 days and aspirin 7 days before surgery - restart as soon as practical after.  Therefore, based on ACC/AHA guidelines, the patient would be at acceptable risk for the planned procedure without further cardiovascular testing.   The patient was advised that if she develops new symptoms prior to surgery to contact our office to arrange for a follow-up visit, and she verbalized understanding.  I will route this recommendation to the requesting party via Epic fax function and remove from pre-op pool. Please call with questions.  Roe Rutherford Nylan Nakatani, PA 02/05/2021, 9:21 AM

## 2021-02-07 MED ORDER — TICAGRELOR 60 MG PO TABS: 60.0000 mg | ORAL_TABLET | Freq: Two times a day (BID) | ORAL | 0 refills | Status: DC

## 2021-02-07 NOTE — Addendum Note (Signed)
Addended by: Rosalee Kaufman on: 02/07/2021 04:49 PM   Modules accepted: Orders

## 2021-02-11 NOTE — Patient Instructions (Signed)
DUE TO COVID-19 ONLY ONE VISITOR IS ALLOWED TO COME WITH YOU AND STAY IN THE WAITING ROOM ONLY DURING PRE OP AND PROCEDURE.   **NO VISITORS ARE ALLOWED IN THE SHORT STAY AREA OR RECOVERY ROOM!!**  IF YOU WILL BE ADMITTED INTO THE HOSPITAL YOU ARE ALLOWED ONLY TWO SUPPORT PEOPLE DURING VISITATION HOURS ONLY (7 AM -8PM)   The support person(s) must pass our screening, gel in and out, and wear a mask at all times, including in the patient's room. Patients must also wear a mask when staff or their support person are in the room. Visitors GUEST BADGE MUST BE WORN VISIBLY  One adult visitor may remain with you overnight and MUST be in the room by 8 P.M.  No visitors under the age of 28. Any visitor under the age of 84 must be accompanied by an adult.    COVID SWAB TESTING MUST BE COMPLETED ON:  02/22/21                    8A - 3P **MUST PRESENT COMPLETED FORM AT TESTING SITE**    [address] [place] (backside of the building) You are not required to quarantine, however you are required to wear a well-fitted mask when you are out and around people not in your household.  Hand Hygiene often Do NOT share personal items Notify your provider if you are in close contact with someone who has COVID or you develop fever 100.4 or greater, new onset of sneezing, cough, sore throat, shortness of breath or body aches.  [facility] Entrance [address], [address], must go inside of the hospital, NOT A DRIVE THRU!  (Must self quarantine after testing. Follow instructions on handout.)       Your procedure is scheduled on: 02/26/21   Report to [facility] Main Entrance    Report to admitting at:  7:30 AM   Call this number if you have problems the morning of surgery [phone]   Do not eat food :After Midnight.   May have liquids until : 7:15 AM   day of surgery  CLEAR LIQUID DIET  Foods Allowed                                                                      Foods Excluded  Water, Black Coffee and tea, regular and decaf                             liquids that you cannot  Plain Jell-O in any flavor  (No red)                                           see through such as: Fruit ices (not with fruit pulp)                                     milk, soups, orange juice  Iced Popsicles (No red)                                    All solid food                                   Apple juices Sports drinks like Gatorade (No red) Lightly seasoned clear broth or consume(fat free) Sugar  Sample Menu Breakfast                                Lunch                                     Supper Cranberry juice                    Beef broth                            Chicken broth Jell-O                                     Grape juice                           Apple juice Coffee or tea                        Jell-O                                      Popsicle                                                Coffee or tea                        Coffee or tea      Complete one Gatorade drink the morning of surgery at : 7:15 AM      the day of surgery.    The day of surgery:  Drink ONE (1) Pre-Surgery Clear Ensure or G2 by am the morning of surgery. Drink in one sitting. Do not sip.  This drink was given to you during your hospital  pre-op appointment visit. Nothing else to drink after completing the  Pre-Surgery Clear Ensure or G2.          If you have questions, please contact your surgeon's office.    Oral Hygiene is also important to reduce your risk of infection.                                    Remember - BRUSH YOUR TEETH THE MORNING OF SURGERY WITH YOUR REGULAR TOOTHPASTE   Do NOT smoke after Midnight   Take these medicines the  morning of surgery with A SIP OF WATER: metoprolol,amlodipine,isosorbide,gabapentin,cetirizine,pantoprazole,ranexa.  DO NOT TAKE ANY ORAL DIABETIC MEDICATIONS DAY OF YOUR SURGERY                               You may not have any metal on your body including hair pins, jewelry, and body piercing             Do not wear make-up, lotions, powders, perfumes/cologne, or deodorant  Do not wear nail polish including gel and S&S, artificial/acrylic nails, or any other type of covering on natural nails including finger and toenails. If you have artificial nails, gel coating, etc. that needs to be removed by a nail salon please have this removed prior to surgery or surgery may need to be canceled/ delayed if the surgeon/ anesthesia feels like they are unable to be safely monitored.   Do not shave  48 hours prior to surgery.    Do not bring valuables to the hospital. [place] [facility] IS NOT             RESPONSIBLE   FOR VALUABLES.   Contacts, dentures or bridgework may not be worn into surgery.   Bring small overnight bag day of surgery.    Patients discharged on the day of surgery will not be allowed to drive home.   Special Instructions: Bring a copy of your healthcare power of attorney and living will documents         the day of surgery if you haven't scanned them before.              Please read over the following fact sheets you were given: IF YOU HAVE QUESTIONS ABOUT YOUR PRE-OP INSTRUCTIONS PLEASE CALL [phone]     [facility] - Preparing for Surgery Before surgery, you can play an important role.  Because skin is not sterile, your skin needs to be as free of germs as possible.  You can reduce the number of germs on your skin by washing with CHG (chlorahexidine gluconate) soap before surgery.  CHG is an antiseptic cleaner which kills germs and bonds with the skin to continue killing germs even after washing. Please DO NOT use if you have an allergy to CHG or antibacterial soaps.  If your skin becomes reddened/irritated stop using the CHG and inform your nurse when you arrive at Short Stay. Do not shave (including legs and underarms) for at least 48 hours prior to the first CHG  shower.  You may shave your face/neck. Please follow these instructions carefully:  1.  Shower with CHG Soap the night before surgery and the  morning of Surgery.  2.  If you choose to wash your hair, wash your hair first as usual with your  normal  shampoo.  3.  After you shampoo, rinse your hair and body thoroughly to remove the  shampoo.                           4.  Use CHG as you would any other liquid soap.  You can apply chg directly  to the skin and wash                       Gently with a scrungie or clean washcloth.  5.  Apply the CHG Soap to your body ONLY FROM THE NECK DOWN.   Do not  use on face/ open                           Wound or open sores. Avoid contact with eyes, ears mouth and genitals (private parts).                       Wash face,  Genitals (private parts) with your normal soap.             6.  Wash thoroughly, paying special attention to the area where your surgery  will be performed.  7.  Thoroughly rinse your body with warm water from the neck down.  8.  DO NOT shower/wash with your normal soap after using and rinsing off  the CHG Soap.                9.  Pat yourself dry with a clean towel.            10.  Wear clean pajamas.            11.  Place clean sheets on your bed the night of your first shower and do not  sleep with pets. Day of Surgery : Do not apply any lotions/deodorants the morning of surgery.  Please wear clean clothes to the hospital/surgery center.  FAILURE TO FOLLOW THESE INSTRUCTIONS MAY RESULT IN THE CANCELLATION OF YOUR SURGERY PATIENT SIGNATURE_________________________________  NURSE SIGNATURE__________________________________  ________________________________________________________________________   Tamara Nunez  An incentive spirometer is a tool that can help keep your lungs clear and active. This tool measures how well you are filling your lungs with each breath. Taking long deep breaths may help reverse or decrease the chance  of developing breathing (pulmonary) problems (especially infection) following: A long period of time when you are unable to move or be active. BEFORE THE PROCEDURE  If the spirometer includes an indicator to show your best effort, your nurse or respiratory therapist will set it to a desired goal. If possible, sit up straight or lean slightly forward. Try not to slouch. Hold the incentive spirometer in an upright position. INSTRUCTIONS FOR USE  Sit on the edge of your bed if possible, or sit up as far as you can in bed or on a chair. Hold the incentive spirometer in an upright position. Breathe out normally. Place the mouthpiece in your mouth and seal your lips tightly around it. Breathe in slowly and as deeply as possible, raising the piston or the ball toward the top of the column. Hold your breath for 3-5 seconds or for as long as possible. Allow the piston or ball to fall to the bottom of the column. Remove the mouthpiece from your mouth and breathe out normally. Rest for a few seconds and repeat Steps 1 through 7 at least 10 times every 1-2 hours when you are awake. Take your time and take a few normal breaths between deep breaths. The spirometer may include an indicator to show your best effort. Use the indicator as a goal to work toward during each repetition. After each set of 10 deep breaths, practice coughing to be sure your lungs are clear. If you have an incision (the cut made at the time of surgery), support your incision when coughing by placing a pillow or rolled up towels firmly against it. Once you are able to get out of bed, walk around indoors and cough well. You may stop using the incentive spirometer when instructed  by your caregiver.  RISKS AND COMPLICATIONS Take your time so you do not get dizzy or light-headed. If you are in pain, you may need to take or ask for pain medication before doing incentive spirometry. It is harder to take a deep breath if you are having  pain. AFTER USE Rest and breathe slowly and easily. It can be helpful to keep track of a log of your progress. Your caregiver can provide you with a simple table to help with this. If you are using the spirometer at home, follow these instructions: SEEK MEDICAL CARE IF:  You are having difficultly using the spirometer. You have trouble using the spirometer as often as instructed. Your pain medication is not giving enough relief while using the spirometer. You develop fever of 100.5 F (38.1 C) or higher. SEEK IMMEDIATE MEDICAL CARE IF:  You cough up bloody sputum that had not been present before. You develop fever of 102 F (38.9 C) or greater. You develop worsening pain at or near the incision site. MAKE SURE YOU:  Understand these instructions. Will watch your condition. Will get help right away if you are not doing well or get worse. Document Released: 07/07/2006 Document Revised: 05/19/2011 Document Reviewed: 09/07/2006 [facility] Patient Information 2014 [place], [place].   ________________________________________________________________________

## 2021-02-13 ENCOUNTER — Encounter (HOSPITAL_COMMUNITY): Payer: Self-pay

## 2021-02-13 ENCOUNTER — Encounter (HOSPITAL_COMMUNITY)
Admission: RE | Admit: 2021-02-13 | Discharge: 2021-02-13 | Disposition: A | Discharge status: Home / Self Care | Payer: Medicare PPO | Source: Ambulatory Visit | Attending: Orthopedic Surgery | Admitting: Orthopedic Surgery

## 2021-02-13 ENCOUNTER — Other Ambulatory Visit: Payer: Self-pay

## 2021-02-13 VITALS — BP 136/72 | HR 60 | Temp 98.2°F | Ht 64.0 in | Wt 172.0 lb

## 2021-02-13 DIAGNOSIS — Z79899 Other long term (current) drug therapy: Secondary | ICD-10-CM | POA: Diagnosis not present

## 2021-02-13 DIAGNOSIS — Z01818 Encounter for other preprocedural examination: Secondary | ICD-10-CM

## 2021-02-13 DIAGNOSIS — Z01812 Encounter for preprocedural laboratory examination: Secondary | ICD-10-CM | POA: Insufficient documentation

## 2021-02-13 DIAGNOSIS — E119 Type 2 diabetes mellitus without complications: Secondary | ICD-10-CM

## 2021-02-13 DIAGNOSIS — M1712 Unilateral primary osteoarthritis, left knee: Secondary | ICD-10-CM

## 2021-02-13 LAB — CBC
HCT: 35.2 % — ABNORMAL LOW (ref 36.0–46.0)
Hemoglobin: 10.9 g/dL — ABNORMAL LOW (ref 12.0–15.0)
MCH: 26.1 pg (ref 26.0–34.0)
MCHC: 31 g/dL (ref 30.0–36.0)
MCV: 84.4 fL (ref 80.0–100.0)
Platelets: 303 10*3/uL (ref 150–400)
RBC: 4.17 MIL/uL (ref 3.87–5.11)
RDW: 18.2 % — ABNORMAL HIGH (ref 11.5–15.5)
WBC: 5.8 10*3/uL (ref 4.0–10.5)
nRBC: 0 % (ref 0.0–0.2)

## 2021-02-13 LAB — COMPREHENSIVE METABOLIC PANEL
ALT: 31 U/L (ref 0–44)
AST: 34 U/L (ref 15–41)
Albumin: 3.8 g/dL (ref 3.5–5.0)
Alkaline Phosphatase: 95 U/L (ref 38–126)
Anion gap: 5 (ref 5–15)
BUN: 16 mg/dL (ref 8–23)
CO2: 26 mmol/L (ref 22–32)
Calcium: 8.8 mg/dL — ABNORMAL LOW (ref 8.9–10.3)
Chloride: 107 mmol/L (ref 98–111)
Creatinine, Ser: 0.89 mg/dL (ref 0.44–1.00)
GFR, Estimated: >60 mL/min (ref >60)
Glucose, Bld: 93 mg/dL (ref 70–99)
Potassium: 4.1 mmol/L (ref 3.5–5.1)
Sodium: 138 mmol/L (ref 135–145)
Total Bilirubin: 0.4 mg/dL (ref 0.3–1.2)
Total Protein: 7.2 g/dL (ref 6.5–8.1)

## 2021-02-13 LAB — TYPE AND SCREEN
ABO/RH(D): O POS
Antibody Screen: NEGATIVE

## 2021-02-13 LAB — SURGICAL PCR SCREEN
MRSA, PCR: NEGATIVE
Staphylococcus aureus: NEGATIVE

## 2021-02-13 LAB — HEMOGLOBIN A1C
Hgb A1c MFr Bld: 6.1 % — ABNORMAL HIGH (ref 4.8–5.6)
Mean Plasma Glucose: 128.37 mg/dL

## 2021-02-13 LAB — GLUCOSE, CAPILLARY: Glucose-Capillary: 99 mg/dL (ref 70–99)

## 2021-02-13 NOTE — Progress Notes (Signed)
COVID Vaccine Completed: Yes  Date COVID Vaccine completed: 2022 x 4 COVID vaccine manufacturer: Pfizer     COVID Test: 02/22/21 PCP -Dr. Early Chars  Cardiologist - Dr. Zoila Shutter. LOV: 12/04/20. Clearance: Lynnae Sandhoff Duke: PA: 02/05/21: EPIC  Chest x-ray - 02/19/20 EKG - 12/04/20 Stress Test -  ECHO -  Cardiac Cath - 03/08/20 Pacemaker/ICD device last checked:  Sleep Study -  CPAP -   Fasting Blood Sugar - 100's Checks Blood Sugar __1___ times a week  Blood Thinner Instructions:Brilinta will be held 5 days before surgery.Aspirin 7 days before. Aspirin Instructions: Cardiologist. Last Dose:  Anesthesia review: Hx: CAD,HTN,DIA,CABG(2006)  Patient denies shortness of breath, fever, cough and chest pain at PAT appointment   Patient verbalized understanding of instructions that were given to them at the PAT appointment. Patient was also instructed that they will need to review over the PAT instructions again at home before surgery.

## 2021-02-19 NOTE — Anesthesia Preprocedure Evaluation (Addendum)
Anesthesia Evaluation  Patient identified by MRN, date of birth, ID band Patient awake    Reviewed: Allergy & Precautions, NPO status , Patient's Chart, lab work & pertinent test results  History of Anesthesia Complications Negative for: history of anesthetic complications  Airway Mallampati: II  TM Distance: >3 FB Neck ROM: Full    Dental   Pulmonary neg pulmonary ROS,    Pulmonary exam normal        Cardiovascular Exercise Tolerance: Good hypertension, + CAD, + Past MI, + Cardiac Stents, + CABG and + Peripheral Vascular Disease  Normal cardiovascular exam   Cath 03/08/20: 1. CAD with continued patency of prior stents. No new stenoses to explain her recent symptoms. 2. Patent SVG to LAD 3. Atretic LIMA to the LAD which is supplied by the SVG 4. Normal LV function 5. Normal LVEDP   Neuro/Psych negative neurological ROS     GI/Hepatic Neg liver ROS, GERD  ,  Endo/Other  diabetes, Type 2  Renal/GU negative Renal ROS  negative genitourinary   Musculoskeletal  (+) Arthritis , Fibromyalgia -  Abdominal   Peds  Hematology negative hematology ROS (+)   Anesthesia Other Findings   Reproductive/Obstetrics                            Anesthesia Physical Anesthesia Plan  ASA: 3  Anesthesia Plan: Spinal   Post-op Pain Management: Regional block and Toradol IV (intra-op)   Induction:   PONV Risk Score and Plan: 2 and Propofol infusion, Treatment may vary due to age or medical condition, Ondansetron, TIVA and Dexamethasone  Airway Management Planned: Nasal Cannula and Simple Face Mask  Additional Equipment: None  Intra-op Plan:   Post-operative Plan:   Informed Consent: I have reviewed the patients History and Physical, chart, labs and discussed the procedure including the risks, benefits and alternatives for the proposed anesthesia with the patient or authorized representative who has  indicated his/her understanding and acceptance.       Plan Discussed with:   Anesthesia Plan Comments: (See PAT note 02/13/2021, Jodell Cipro Ward, PA-C)       Anesthesia Quick Evaluation

## 2021-02-19 NOTE — Progress Notes (Signed)
Anesthesia Chart Review   Case: [id number] Date/Time: 02/26/21 1000   Procedure: TOTAL KNEE ARTHROPLASTY (Left: Knee)   Anesthesia type: Spinal   Pre-op diagnosis: Left knee osteoarthritis   Location: [facility] ROOM 09 / [facility]   Surgeons: Durene Romans, MD       DISCUSSION:65 y.o. never smoker with h/o GERD, HTN, DM II (A1C 6.1), CAD, left knee OA scheduled for above procedure 02/26/2021 with Dr. Durene Romans  Per cardiology preoperative evaluation 02/05/2021, "Chart reviewed as part of pre-operative protocol coverage. Patient was contacted 02/05/2021 in reference to pre-operative risk assessment for pending surgery as outlined below.  Tamara Nunez was last seen on 12/04/20 by Dr. Rennis Golden.  Since that day, Tamara Nunez has done well.  She can complete 4.0 METS without angina (climb flight of stairs at church). She is primarily limited by knee pain. She has a 0.9% risk of MACE according to the RCRI.    Per Dr. Rennis Golden: Ok to hold Brilinta for 5 days and aspirin 7 days before surgery - restart as soon as practical after.   Therefore, based on ACC/AHA guidelines, the patient would be at acceptable risk for the planned procedure without further cardiovascular testing."   VS: BP 136/72    Pulse 60    Temp 36.8 C (Oral)    Ht 5\' 4"  (1.626 m)    Wt 78 kg    SpO2 100%    BMI 29.52 kg/m   PROVIDERS: , MD is PCP   Theodoro Kos, MD is Cardiologist  LABS: Labs reviewed: Acceptable for surgery. (all labs ordered are listed, but only abnormal results are displayed)  Labs Reviewed  HEMOGLOBIN A1C - Abnormal; Notable for the following components:      Result Value   Hgb A1c MFr Bld 6.1 (*)    All other components within normal limits  CBC - Abnormal; Notable for the following components:   Hemoglobin 10.9 (*)    HCT 35.2 (*)    RDW 18.2 (*)    All other components within normal limits  COMPREHENSIVE METABOLIC PANEL - Abnormal; Notable for the following components:   Calcium 8.8  (*)    All other components within normal limits  SURGICAL PCR SCREEN  GLUCOSE, CAPILLARY  TYPE AND SCREEN     IMAGES:   EKG: 12/04/2020 Rate 64 bpm  Sinus rhythm with marked sinus arrhythmia RBBB  CV: Cardiac Cath 03/08/2020 Non-stenotic Ost 1st Diag-2 lesion was previously treated. Non-stenotic Ost 1st Diag-1 lesion was previously treated. Prox LAD to Mid LAD lesion is 100% stenosed. Prox LAD-2 lesion is 100% stenosed. Non-stenotic Prox LAD-1 lesion was previously treated. Ost 2nd Mrg lesion is 70% stenosed. Prox Cx lesion is 40% stenosed. Mid Cx lesion is 20% stenosed. Non-stenotic Ost Cx lesion was previously treated. Prox RCA lesion is 10% stenosed. Prox RCA to Mid RCA lesion is 30% stenosed. LIMA. Origin to Mid Graft lesion is 99% stenosed. SVG and is normal in caliber. Mid RCA to Dist RCA lesion is 35% stenosed. The left ventricular systolic function is normal. LV end diastolic pressure is normal. The left ventricular ejection fraction is 55-65% by visual estimate.   1. CAD with continued patency of prior stents. No new stenoses to explain her recent symptoms. 2. Patent SVG to LAD 3. Atretic LIMA to the LAD which is supplied by the SVG 4. Normal LV function 5. Normal LVEDP   Plan: continue medical therapy. Past Medical History:  Diagnosis Date  Arthritis    "in my back" (05/06/2017)   CAD (coronary artery disease)    CABG 2006, 09/2016 DES RCA, 04/2017 DES Circ, 06/2018 DES 1st diag   Chronic back pain    Coronary artery disease    a. s/p CABG in 2006 with LIMA-LAD, SVG-dLAD. b. cath in 2009 showing patent grafts. c. 09/2016: cath with occluded LAD but patent SVG-dLAD. LIMA was atretic. pRCA with 80% stenosis --> treated with DES   Fibromyalgia    GERD (gastroesophageal reflux disease)    Hyperlipidemia    Hypertension    Type II diabetes mellitus (HCC)     Past Surgical History:  Procedure Laterality Date   ABDOMINAL HERNIA REPAIR  2018   BACK  SURGERY     CARDIAC CATHETERIZATION  01/13/2008   COLONOSCOPY     CORONARY ANGIOPLASTY WITH STENT PLACEMENT  09/26/2016   Prox RCA lesion, 80 %stenosed. DES    CORONARY ARTERY BYPASS GRAFT  12/23/2004   2 vessel - LIMA to LAD (atretic), SVG to distal LAD   CORONARY ATHERECTOMY N/A 08/16/2019   Procedure: CORONARY ATHERECTOMY;  Surgeon: Swaziland, Peter M, MD;  Location: [facility] INVASIVE CV LAB;  Service: Cardiovascular;  Laterality: N/A;   CORONARY STENT INTERVENTION N/A 09/26/2016   Procedure: Coronary Stent Intervention;  Surgeon: Marykay Lex, MD;  Location: [facility] INVASIVE CV LAB;  Service: Cardiovascular;  Laterality: N/A;   CORONARY STENT INTERVENTION N/A 05/06/2017   Procedure: CORONARY STENT INTERVENTION;  Surgeon: Kathleene Hazel, MD;  Location: [facility] INVASIVE CV LAB;  Service: Cardiovascular;  Laterality: N/A;   CORONARY STENT INTERVENTION N/A 06/25/2018   Procedure: CORONARY STENT INTERVENTION;  Surgeon: Swaziland, Peter M, MD;  Location: [facility] INVASIVE CV LAB;  Service: Cardiovascular;  Laterality: N/A;   CORONARY STENT INTERVENTION N/A 08/12/2019   Procedure: CORONARY STENT INTERVENTION;  Surgeon: Lyn Records, MD;  Location: [facility] INVASIVE CV LAB;  Service: Cardiovascular;  Laterality: N/A;   CORONARY STENT INTERVENTION N/A 08/16/2019   Procedure: CORONARY STENT INTERVENTION;  Surgeon: Swaziland, Peter M, MD;  Location: [facility] INVASIVE CV LAB;  Service: Cardiovascular;  Laterality: N/A;   DOPPLER ECHOCARDIOGRAPHY  04/12/2008   EF 60%   GASTRIC BYPASS  2009   HERNIA REPAIR     LEFT HEART CATH AND CORONARY ANGIOGRAPHY N/A 05/06/2017   Procedure: LEFT HEART CATH AND CORONARY ANGIOGRAPHY;  Surgeon: Kathleene Hazel, MD;  Location: [facility] INVASIVE CV LAB;  Service: Cardiovascular;  Laterality: N/A;   LEFT HEART CATH AND CORS/GRAFTS ANGIOGRAPHY N/A 09/26/2016   Procedure: Left Heart Cath and Cors/Grafts Angiography;  Surgeon: Marykay Lex, MD;  Location: [facility] INVASIVE CV LAB;  Service: Cardiovascular;   Laterality: N/A;   LEFT HEART CATH AND CORS/GRAFTS ANGIOGRAPHY N/A 06/25/2018   Procedure: LEFT HEART CATH AND CORS/GRAFTS ANGIOGRAPHY;  Surgeon: Swaziland, Peter M, MD;  Location: [facility] INVASIVE CV LAB;  Service: Cardiovascular;  Laterality: N/A;   LEFT HEART CATH AND CORS/GRAFTS ANGIOGRAPHY N/A 08/12/2019   Procedure: LEFT HEART CATH AND CORS/GRAFTS ANGIOGRAPHY;  Surgeon: Lyn Records, MD;  Location: [facility] INVASIVE CV LAB;  Service: Cardiovascular;  Laterality: N/A;   LEFT HEART CATH AND CORS/GRAFTS ANGIOGRAPHY N/A 03/08/2020   Procedure: LEFT HEART CATH AND CORS/GRAFTS ANGIOGRAPHY;  Surgeon: Swaziland, Peter M, MD;  Location: [facility] INVASIVE CV LAB;  Service: Cardiovascular;  Laterality: N/A;   NM MYOVIEW LTD  06/03/2012   EF 71%   POSTERIOR FUSION LUMBAR SPINE  1990s   TUBAL LIGATION     VAGINAL HYSTERECTOMY     "  partial"    MEDICATIONS:  acetaminophen (TYLENOL) 500 MG tablet   amLODipine (NORVASC) 2.5 MG tablet   aspirin EC 81 MG tablet   atorvastatin (LIPITOR) 40 MG tablet   cetirizine (ZYRTEC) 10 MG tablet   Cholecalciferol 50 MCG (2000 UT) CAPS   cilostazol (PLETAL) 50 MG tablet   Coenzyme Q10 (CO Q-10) 200 MG CAPS   cyclobenzaprine (FLEXERIL) 10 MG tablet   diclofenac sodium (VOLTAREN) 1 % GEL   dicyclomine (BENTYL) 10 MG capsule   Docusate Calcium (STOOL SOFTENER PO)   Fingerstix Lancets MISC   fluticasone (FLONASE) 50 MCG/ACT nasal spray   furosemide (LASIX) 40 MG tablet   gabapentin (NEURONTIN) 600 MG tablet   Iron-Vitamin C (VITRON-C PO)   isosorbide mononitrate (IMDUR) 30 MG 24 hr tablet   metoprolol tartrate (LOPRESSOR) 50 MG tablet   Multiple Vitamin (MULTIVITAMIN WITH MINERALS) TABS tablet   nitroGLYCERIN (NITROSTAT) 0.4 MG SL tablet   ONETOUCH ULTRA test strip   pantoprazole (PROTONIX) 40 MG tablet   ranolazine (RANEXA) 1000 MG SR tablet   ticagrelor (BRILINTA) 60 MG TABS tablet   vitamin B-12 (CYANOCOBALAMIN) 1000 MCG tablet   vitamin E 400 UNIT capsule    sodium chloride  flush (NS) 0.9 % injection 3 mL   [facility] Ward, PA-C [facility] Pre-Surgical Testing [phone]

## 2021-02-21 NOTE — H&P (Signed)
TOTAL KNEE ADMISSION H&P  Patient is being admitted for left total knee arthroplasty.  Subjective:  Chief Complaint:left knee pain.  HPI: Tamara Nunez, 65 y.o. female, has a history of pain and functional disability in the left knee due to arthritis and has failed non-surgical conservative treatments for greater than 12 weeks to includeNSAID's and/or analgesics, corticosteriod injections, and activity modification.  Onset of symptoms was gradual, starting 2 years ago with gradually worsening course since that time. The patient noted prior procedures on the knee to include  arthroscopy and menisectomy on the left knee(s).  Patient currently rates pain in the left knee(s) at 8 out of 10 with activity. Patient has worsening of pain with activity and weight bearing, pain that interferes with activities of daily living, and pain with passive range of motion.  Patient has evidence of joint space narrowing by imaging studies. There is no active infection.  Patient Active Problem List   Diagnosis Date Noted   Peripheral arterial disease (HCC) 02/24/2020   Chronic anemia 08/17/2019   Type 4a myocardial infarction [facility]) 08/17/2019   CAD (coronary artery disease) 08/12/2019   Degenerative lumbar spinal stenosis 01/28/2019   Vitamin D deficiency 11/01/2018   Unstable angina (HCC) 06/25/2018   Bilateral lower extremity edema 11/02/2017   CAD S/P percutaneous coronary angioplasty  05/07/2017   Unstable angina pectoris [facility])    Preoperative cardiovascular examination 03/11/2017   Essential hypertension, benign    Mixed hyperlipidemia    GERD (gastroesophageal reflux disease)    Diabetes mellitus without complication (HCC)    Coronary artery disease    Coronary artery disease involving coronary bypass graft of native heart with unstable angina pectoris (HCC) 10/27/2016   Other fatigue 10/27/2016   DOE (dyspnea on exertion) 10/27/2016   Progressive angina (HCC)    Atherosclerosis of native coronary  artery of native heart without angina pectoris 11/29/2012   S/P CABG x 2 11/29/2012   Stable angina (HCC) 11/29/2012   HTN (hypertension) 11/29/2012   Dyslipidemia 11/29/2012   DM type 2 causing vascular disease (HCC) 11/29/2012   Past Medical History:  Diagnosis Date   Arthritis    "in my back" (05/06/2017)   CAD (coronary artery disease)    CABG 2006, 09/2016 DES RCA, 04/2017 DES Circ, 06/2018 DES 1st diag   Chronic back pain    Coronary artery disease    a. s/p CABG in 2006 with LIMA-LAD, SVG-dLAD. b. cath in 2009 showing patent grafts. c. 09/2016: cath with occluded LAD but patent SVG-dLAD. LIMA was atretic. pRCA with 80% stenosis --> treated with DES   Fibromyalgia    GERD (gastroesophageal reflux disease)    Hyperlipidemia    Hypertension    Type II diabetes mellitus (HCC)     Past Surgical History:  Procedure Laterality Date   ABDOMINAL HERNIA REPAIR  2018   BACK SURGERY     CARDIAC CATHETERIZATION  01/13/2008   COLONOSCOPY     CORONARY ANGIOPLASTY WITH STENT PLACEMENT  09/26/2016   Prox RCA lesion, 80 %stenosed. DES    CORONARY ARTERY BYPASS GRAFT  12/23/2004   2 vessel - LIMA to LAD (atretic), SVG to distal LAD   CORONARY ATHERECTOMY N/A 08/16/2019   Procedure: CORONARY ATHERECTOMY;  Surgeon: Swaziland, Peter M, MD;  Location: [facility] INVASIVE CV LAB;  Service: Cardiovascular;  Laterality: N/A;   CORONARY STENT INTERVENTION N/A 09/26/2016   Procedure: Coronary Stent Intervention;  Surgeon: Marykay Lex, MD;  Location: [facility] INVASIVE CV LAB;  Service:  Cardiovascular;  Laterality: N/A;   CORONARY STENT INTERVENTION N/A 05/06/2017   Procedure: CORONARY STENT INTERVENTION;  Surgeon: Burnell Blanks, MD;  Location: [place] CV LAB;  Service: Cardiovascular;  Laterality: N/A;   CORONARY STENT INTERVENTION N/A 06/25/2018   Procedure: CORONARY STENT INTERVENTION;  Surgeon: Martinique, Peter M, MD;  Location: [place] CV LAB;  Service: Cardiovascular;  Laterality: N/A;   CORONARY  STENT INTERVENTION N/A 08/12/2019   Procedure: CORONARY STENT INTERVENTION;  Surgeon: Belva Crome, MD;  Location: [place] CV LAB;  Service: Cardiovascular;  Laterality: N/A;   CORONARY STENT INTERVENTION N/A 08/16/2019   Procedure: CORONARY STENT INTERVENTION;  Surgeon: Martinique, Peter M, MD;  Location: [place] CV LAB;  Service: Cardiovascular;  Laterality: N/A;   DOPPLER ECHOCARDIOGRAPHY  04/12/2008   EF 60%   GASTRIC BYPASS  2009   HERNIA REPAIR     LEFT HEART CATH AND CORONARY ANGIOGRAPHY N/A 05/06/2017   Procedure: LEFT HEART CATH AND CORONARY ANGIOGRAPHY;  Surgeon: Burnell Blanks, MD;  Location: [place] CV LAB;  Service: Cardiovascular;  Laterality: N/A;   LEFT HEART CATH AND CORS/GRAFTS ANGIOGRAPHY N/A 09/26/2016   Procedure: Left Heart Cath and Cors/Grafts Angiography;  Surgeon: Leonie Man, MD;  Location: [place] CV LAB;  Service: Cardiovascular;  Laterality: N/A;   LEFT HEART CATH AND CORS/GRAFTS ANGIOGRAPHY N/A 06/25/2018   Procedure: LEFT HEART CATH AND CORS/GRAFTS ANGIOGRAPHY;  Surgeon: Martinique, Peter M, MD;  Location: [place] CV LAB;  Service: Cardiovascular;  Laterality: N/A;   LEFT HEART CATH AND CORS/GRAFTS ANGIOGRAPHY N/A 08/12/2019   Procedure: LEFT HEART CATH AND CORS/GRAFTS ANGIOGRAPHY;  Surgeon: Belva Crome, MD;  Location: [place] CV LAB;  Service: Cardiovascular;  Laterality: N/A;   LEFT HEART CATH AND CORS/GRAFTS ANGIOGRAPHY N/A 03/08/2020   Procedure: LEFT HEART CATH AND CORS/GRAFTS ANGIOGRAPHY;  Surgeon: Martinique, Peter M, MD;  Location: [place] CV LAB;  Service: Cardiovascular;  Laterality: N/A;   NM MYOVIEW LTD  06/03/2012   EF 71%   POSTERIOR FUSION LUMBAR SPINE  1990s   TUBAL LIGATION     VAGINAL HYSTERECTOMY     "partial"    Current Facility-Administered Medications  Medication Dose Route Frequency Provider Last Rate Last Admin   sodium chloride flush (NS) 0.9 % injection 3 mL  3 mL Intravenous Q12H Almyra Deforest, [place]       Current  Outpatient Medications  Medication Sig Dispense Refill Last Dose   acetaminophen (TYLENOL) 500 MG tablet Take 1,000 mg by mouth every 6 (six) hours as needed for moderate pain or headache.      amLODipine (NORVASC) 2.5 MG tablet Take 1 tablet (2.5 mg total) by mouth daily. 90 tablet 3    aspirin EC 81 MG tablet Take 81 mg by mouth daily.      atorvastatin (LIPITOR) 40 MG tablet Take 1 tablet (40 mg total) by mouth daily. 30 tablet 6    cetirizine (ZYRTEC) 10 MG tablet Take 10 mg by mouth daily.      cilostazol (PLETAL) 50 MG tablet Take 1 tablet (50 mg total) by mouth 2 (two) times daily. 180 tablet 3    Coenzyme Q10 (CO Q-10) 200 MG CAPS Take 200 mg by mouth daily.      cyclobenzaprine (FLEXERIL) 10 MG tablet Take 10 mg by mouth 3 (three) times daily as needed for muscle spasms.      diclofenac sodium (VOLTAREN) 1 % GEL Apply 2 g topically in the morning  and at bedtime.      dicyclomine (BENTYL) 10 MG capsule Take 10 mg by mouth every 6 (six) hours as needed for spasms.      Docusate Calcium (STOOL SOFTENER PO) Take 100 mg by mouth daily.      fluticasone (FLONASE) 50 MCG/ACT nasal spray Place 1 spray into both nostrils 2 (two) times daily.      furosemide (LASIX) 40 MG tablet Take 40 mg by mouth daily as needed for fluid or edema.      gabapentin (NEURONTIN) 600 MG tablet Take 600 mg by mouth 3 (three) times daily.       Iron-Vitamin C (VITRON-C PO) Take 1 tablet by mouth daily.      isosorbide mononitrate (IMDUR) 30 MG 24 hr tablet Take 1 tablet (30 mg total) by mouth daily. 90 tablet 1    metoprolol tartrate (LOPRESSOR) 50 MG tablet Take 1 tablet (50 mg total) by mouth 2 (two) times daily. 180 tablet 2    Multiple Vitamin (MULTIVITAMIN WITH MINERALS) TABS tablet Take 1 tablet by mouth daily.      nitroGLYCERIN (NITROSTAT) 0.4 MG SL tablet Place 1 tablet (0.4 mg total) under the tongue every 5 (five) minutes as needed for chest pain (up to 3 doses). 25 tablet 3    pantoprazole (PROTONIX) 40  MG tablet Take 40 mg by mouth daily as needed (acid reflux).       ranolazine (RANEXA) 1000 MG SR tablet Take 1 tablet (1,000 mg total) by mouth 2 (two) times daily. 56 tablet 0    ticagrelor (BRILINTA) 60 MG TABS tablet Take 1 tablet (60 mg total) by mouth 2 (two) times daily. 42 tablet 0    vitamin B-12 (CYANOCOBALAMIN) 1000 MCG tablet Take 1,000 mcg by mouth daily.      vitamin E 400 UNIT capsule Take 400 Units by mouth daily.      Cholecalciferol 50 MCG (2000 UT) CAPS Take 1 capsule (2,000 Units total) by mouth daily with breakfast. (Patient not taking: Reported on 02/08/2021) 90 capsule 1 Not Taking   Fingerstix Lancets MISC Inject into the skin.      ONETOUCH ULTRA test strip Test BG qid 400 each 1    Allergies  Allergen Reactions   Codeine Hives and Shortness Of Breath        Other Anaphylaxis    Nuts   Ciprofloxacin Itching   Valsartan Rash   Adhesive [Tape] Itching    "clear tape" used after surgery."took all her skin off".  Paper take is ok   Sulfa Antibiotics Nausea Only    Social History   Tobacco Use   Smoking status: Never   Smokeless tobacco: Never  Substance Use Topics   Alcohol use: No    Family History  Problem Relation Age of Onset   Heart disease Mother    Cancer - Lung Father      Review of Systems  Constitutional:  Negative for chills and fever.  Respiratory:  Negative for cough and shortness of breath.   Cardiovascular:  Negative for chest pain.  Gastrointestinal:  Negative for nausea and vomiting.  Musculoskeletal:  Positive for arthralgias.    Objective:  Physical Exam Well nourished and well developed. General: Alert and oriented x3, cooperative and pleasant, no acute distress. Head: normocephalic, atraumatic, neck supple. Eyes: EOMI.  Musculoskeletal: Bilateral knee exams: No palpable effusions, warmth or erythema Bilateral genu varum of about 5 degrees with slight flexion contracture Flexion over 110 degrees with tightness  and  discomfort reproduced anterior and medially She has stable and intact medial and lateral collateral ligaments No significant calf tenderness  Calves soft and nontender. Motor function intact in LE. Strength 5/5 LE bilaterally. Neuro: Distal pulses 2+. Sensation to light touch intact in LE.  Vital signs in last 24 hours:    Labs:   Estimated body mass index is 29.52 kg/m as calculated from the following:   Height as of 02/13/21: 5\' 4"  (1.626 m).   Weight as of 02/13/21: 78 kg.   Imaging Review Plain radiographs demonstrate severe degenerative joint disease of the left knee(s). The overall alignment isneutral. The bone quality appears to be adequate for age and reported activity level.      Assessment/Plan:  End stage arthritis, left knee   The patient history, physical examination, clinical judgment of the provider and imaging studies are consistent with end stage degenerative joint disease of the left knee(s) and total knee arthroplasty is deemed medically necessary. The treatment options including medical management, injection therapy arthroscopy and arthroplasty were discussed at length. The risks and benefits of total knee arthroplasty were presented and reviewed. The risks due to aseptic loosening, infection, stiffness, patella tracking problems, thromboembolic complications and other imponderables were discussed. The patient acknowledged the explanation, agreed to proceed with the plan and consent was signed. Patient is being admitted for inpatient treatment for surgery, pain control, PT, OT, prophylactic antibiotics, VTE prophylaxis, progressive ambulation and ADL's and discharge planning. The patient is planning to be discharged  home.  Therapy Plans: outpatient therapy at Disposition: Home with husband Planned DVT Prophylaxis: Brillinta & aspirin DME needed: walker PCP: Dr. Bobby Rumpf Cardiologist: Dr. Debara Pickett, clearance received TXA: IV Allergies: codeine -states anaphylaxis -  was given Norco in 2020 by Dr. Vertell Limber and says this did okay, peanut - anaphylaxis Anesthesia Concerns: none BMI: 31.1 Last HgbA1c: Diabetic - <6%  Other: - 2400 mg Gabapentin daily - norco , flexeril, tylenol - no NSAIDs - 5 stents   Patient's anticipated LOS is less than 2 midnights, meeting these requirements: - Younger than 31 - Lives within 1 hour of care - Has a competent adult at home to recover with post-op recover - NO history of  - Chronic pain requiring opiods  - Diabetes  - Coronary Artery Disease  - Heart failure  - Heart attack  - Stroke  - DVT/VTE  - Cardiac arrhythmia  - Respiratory Failure/COPD  - Renal failure  - Anemia  - Advanced Liver disease   Costella Hatcher, PA-C Orthopedic Surgery [facility] [place] [phone]

## 2021-02-22 ENCOUNTER — Other Ambulatory Visit: Payer: Self-pay | Admitting: Orthopedic Surgery

## 2021-02-22 LAB — SARS CORONAVIRUS 2 (TAT 6-24 HRS): SARS Coronavirus 2: NEGATIVE

## 2021-02-26 ENCOUNTER — Other Ambulatory Visit: Payer: Self-pay

## 2021-02-26 ENCOUNTER — Ambulatory Visit (HOSPITAL_COMMUNITY): Payer: Medicare PPO | Admitting: Certified Registered"

## 2021-02-26 ENCOUNTER — Encounter (HOSPITAL_COMMUNITY)
Admission: RE | Disposition: A | Discharge status: Home / Self Care | Payer: Self-pay | Source: Ambulatory Visit | Attending: Orthopedic Surgery

## 2021-02-26 ENCOUNTER — Observation Stay (HOSPITAL_COMMUNITY)
Admission: RE | Admit: 2021-02-26 | Discharge: 2021-02-27 | Disposition: A | Discharge status: Home / Self Care | Payer: Medicare PPO | Source: Ambulatory Visit | Attending: Orthopedic Surgery | Admitting: Orthopedic Surgery

## 2021-02-26 ENCOUNTER — Encounter (HOSPITAL_COMMUNITY): Payer: Self-pay | Admitting: Orthopedic Surgery

## 2021-02-26 ENCOUNTER — Ambulatory Visit (HOSPITAL_COMMUNITY): Payer: Medicare PPO | Admitting: Physician Assistant

## 2021-02-26 DIAGNOSIS — Z79899 Other long term (current) drug therapy: Secondary | ICD-10-CM | POA: Diagnosis not present

## 2021-02-26 DIAGNOSIS — I10 Essential (primary) hypertension: Secondary | ICD-10-CM | POA: Insufficient documentation

## 2021-02-26 DIAGNOSIS — Z8679 Personal history of other diseases of the circulatory system: Secondary | ICD-10-CM | POA: Insufficient documentation

## 2021-02-26 DIAGNOSIS — E119 Type 2 diabetes mellitus without complications: Secondary | ICD-10-CM | POA: Insufficient documentation

## 2021-02-26 DIAGNOSIS — Z96652 Presence of left artificial knee joint: Secondary | ICD-10-CM

## 2021-02-26 DIAGNOSIS — M1712 Unilateral primary osteoarthritis, left knee: Secondary | ICD-10-CM | POA: Diagnosis not present

## 2021-02-26 DIAGNOSIS — I2510 Atherosclerotic heart disease of native coronary artery without angina pectoris: Secondary | ICD-10-CM | POA: Diagnosis not present

## 2021-02-26 HISTORY — PX: TOTAL KNEE ARTHROPLASTY: SHX125

## 2021-02-26 LAB — GLUCOSE, CAPILLARY
Glucose-Capillary: 89 mg/dL (ref 70–99)
Glucose-Capillary: 84 mg/dL (ref 70–99)

## 2021-02-26 SURGERY — ARTHROPLASTY, KNEE, TOTAL
Anesthesia: Spinal | Site: Knee | Laterality: Left

## 2021-02-26 MED ORDER — LACTATED RINGERS IV SOLN: INTRAVENOUS | Status: DC

## 2021-02-26 MED ORDER — OXYCODONE HCL 5 MG PO TABS: 5.0000 mg | ORAL_TABLET | Freq: Once | ORAL | Status: DC | PRN

## 2021-02-26 MED ORDER — FUROSEMIDE 40 MG PO TABS: 40.0000 mg | ORAL_TABLET | Freq: Every day | ORAL | Status: DC | PRN

## 2021-02-26 MED ORDER — DOCUSATE SODIUM 100 MG PO CAPS
100.0000 mg | ORAL_CAPSULE | Freq: Two times a day (BID) | ORAL | Status: DC
  Administered 2021-02-26 – 2021-02-27 (×2): 100 mg via ORAL
  Filled 2021-02-26 (×2): qty 1

## 2021-02-26 MED ORDER — GABAPENTIN 300 MG PO CAPS
600.0000 mg | ORAL_CAPSULE | Freq: Three times a day (TID) | ORAL | Status: DC
  Administered 2021-02-26 – 2021-02-27 (×3): 600 mg via ORAL
  Filled 2021-02-26 (×3): qty 2

## 2021-02-26 MED ORDER — METOCLOPRAMIDE HCL 5 MG/ML IJ SOLN: 5.0000 mg | Freq: Three times a day (TID) | INTRAMUSCULAR | Status: DC | PRN

## 2021-02-26 MED ORDER — FLUTICASONE PROPIONATE 50 MCG/ACT NA SUSP
1.0000 | Freq: Two times a day (BID) | NASAL | Status: DC
  Administered 2021-02-26 – 2021-02-27 (×2): 1 via NASAL
  Filled 2021-02-26: qty 16

## 2021-02-26 MED ORDER — AMISULPRIDE (ANTIEMETIC) 5 MG/2ML IV SOLN: 10.0000 mg | Freq: Once | INTRAVENOUS | Status: DC | PRN

## 2021-02-26 MED ORDER — AMLODIPINE BESYLATE 5 MG PO TABS: 2.5000 mg | ORAL_TABLET | Freq: Every day | ORAL | Status: DC

## 2021-02-26 MED ORDER — BUPIVACAINE-EPINEPHRINE (PF) 0.25% -1:200000 IJ SOLN
INTRAMUSCULAR | Status: AC
  Filled 2021-02-26: qty 30

## 2021-02-26 MED ORDER — PROPOFOL 1000 MG/100ML IV EMUL
INTRAVENOUS | Status: AC
  Filled 2021-02-26: qty 100

## 2021-02-26 MED ORDER — MIDAZOLAM HCL 2 MG/2ML IJ SOLN: 1.0000 mg | INTRAMUSCULAR | Status: DC

## 2021-02-26 MED ORDER — ATORVASTATIN CALCIUM 40 MG PO TABS
40.0000 mg | ORAL_TABLET | Freq: Every day | ORAL | Status: DC
  Administered 2021-02-27: 10:00:00 40 mg via ORAL
  Filled 2021-02-26 (×2): qty 1

## 2021-02-26 MED ORDER — EPHEDRINE SULFATE-NACL 50-0.9 MG/10ML-% IV SOSY
PREFILLED_SYRINGE | INTRAVENOUS | Status: DC | PRN
  Administered 2021-02-26: 5 mg via INTRAVENOUS
  Administered 2021-02-26: 10 mg via INTRAVENOUS
  Administered 2021-02-26 (×2): 5 mg via INTRAVENOUS

## 2021-02-26 MED ORDER — DIPHENHYDRAMINE HCL 12.5 MG/5ML PO ELIX: 12.5000 mg | ORAL_SOLUTION | ORAL | Status: DC | PRN

## 2021-02-26 MED ORDER — CEFAZOLIN SODIUM-DEXTROSE 2-4 GM/100ML-% IV SOLN
2.0000 g | Freq: Four times a day (QID) | INTRAVENOUS | Status: AC
  Administered 2021-02-26 – 2021-02-27 (×2): 2 g via INTRAVENOUS
  Filled 2021-02-26 (×2): qty 100

## 2021-02-26 MED ORDER — TRANEXAMIC ACID-NACL 1000-0.7 MG/100ML-% IV SOLN
1000.0000 mg | INTRAVENOUS | Status: AC
  Administered 2021-02-26: 13:00:00 1000 mg via INTRAVENOUS
  Filled 2021-02-26: qty 100

## 2021-02-26 MED ORDER — CEFAZOLIN SODIUM-DEXTROSE 2-4 GM/100ML-% IV SOLN
2.0000 g | INTRAVENOUS | Status: AC
  Administered 2021-02-26: 13:00:00 2 g via INTRAVENOUS
  Filled 2021-02-26: qty 100

## 2021-02-26 MED ORDER — SODIUM CHLORIDE (PF) 0.9 % IJ SOLN
INTRAMUSCULAR | Status: DC | PRN
  Administered 2021-02-26: 30 mL

## 2021-02-26 MED ORDER — ACETAMINOPHEN 325 MG PO TABS: 325.0000 mg | ORAL_TABLET | Freq: Four times a day (QID) | ORAL | Status: DC | PRN

## 2021-02-26 MED ORDER — ORAL CARE MOUTH RINSE: 15.0000 mL | Freq: Once | OROMUCOSAL | Status: DC

## 2021-02-26 MED ORDER — MENTHOL 3 MG MT LOZG: 1.0000 | LOZENGE | OROMUCOSAL | Status: DC | PRN

## 2021-02-26 MED ORDER — FENTANYL CITRATE (PF) 100 MCG/2ML IJ SOLN
INTRAMUSCULAR | Status: DC | PRN
  Administered 2021-02-26 (×2): 25 ug via INTRAVENOUS

## 2021-02-26 MED ORDER — HYDROCODONE-ACETAMINOPHEN 7.5-325 MG PO TABS
1.0000 | ORAL_TABLET | ORAL | Status: DC | PRN
  Administered 2021-02-26: 22:00:00 2 via ORAL
  Administered 2021-02-26: 17:00:00 1 via ORAL
  Administered 2021-02-27 (×2): 2 via ORAL
  Filled 2021-02-26 (×3): qty 2
  Filled 2021-02-26: qty 1
  Filled 2021-02-26: qty 2

## 2021-02-26 MED ORDER — ASPIRIN EC 81 MG PO TBEC
81.0000 mg | DELAYED_RELEASE_TABLET | Freq: Every day | ORAL | Status: DC
  Administered 2021-02-27: 10:00:00 81 mg via ORAL
  Filled 2021-02-26: qty 1

## 2021-02-26 MED ORDER — SODIUM CHLORIDE 0.9 % IR SOLN
Status: DC | PRN
  Administered 2021-02-26: 1000 mL

## 2021-02-26 MED ORDER — DEXAMETHASONE SODIUM PHOSPHATE 10 MG/ML IJ SOLN
10.0000 mg | Freq: Once | INTRAMUSCULAR | Status: AC
  Administered 2021-02-27: 10:00:00 10 mg via INTRAVENOUS
  Filled 2021-02-26: qty 1

## 2021-02-26 MED ORDER — METOPROLOL TARTRATE 50 MG PO TABS
50.0000 mg | ORAL_TABLET | Freq: Two times a day (BID) | ORAL | Status: DC
  Administered 2021-02-26 – 2021-02-27 (×2): 50 mg via ORAL
  Filled 2021-02-26: qty 1

## 2021-02-26 MED ORDER — HYDROCODONE-ACETAMINOPHEN 5-325 MG PO TABS: 1.0000 | ORAL_TABLET | ORAL | Status: DC | PRN

## 2021-02-26 MED ORDER — ISOSORBIDE MONONITRATE ER 30 MG PO TB24: 30.0000 mg | ORAL_TABLET | Freq: Every day | ORAL | Status: DC

## 2021-02-26 MED ORDER — FENTANYL CITRATE PF 50 MCG/ML IJ SOSY: 25.0000 ug | PREFILLED_SYRINGE | INTRAMUSCULAR | Status: DC | PRN

## 2021-02-26 MED ORDER — LIDOCAINE 2% (20 MG/ML) 5 ML SYRINGE
INTRAMUSCULAR | Status: DC | PRN
  Administered 2021-02-26: 40 mg via INTRAVENOUS

## 2021-02-26 MED ORDER — BUPIVACAINE-EPINEPHRINE (PF) 0.25% -1:200000 IJ SOLN
INTRAMUSCULAR | Status: DC | PRN
  Administered 2021-02-26: 30 mL

## 2021-02-26 MED ORDER — KETOROLAC TROMETHAMINE 30 MG/ML IJ SOLN
INTRAMUSCULAR | Status: DC | PRN
  Administered 2021-02-26: 30 mg

## 2021-02-26 MED ORDER — POLYETHYLENE GLYCOL 3350 17 G PO PACK: 17.0000 g | PACK | Freq: Every day | ORAL | Status: DC | PRN

## 2021-02-26 MED ORDER — BUPIVACAINE-EPINEPHRINE (PF) 0.5% -1:200000 IJ SOLN
INTRAMUSCULAR | Status: AC
  Filled 2021-02-26: qty 30

## 2021-02-26 MED ORDER — DEXAMETHASONE SODIUM PHOSPHATE 10 MG/ML IJ SOLN
INTRAMUSCULAR | Status: AC
  Filled 2021-02-26: qty 1

## 2021-02-26 MED ORDER — FENTANYL CITRATE (PF) 100 MCG/2ML IJ SOLN
INTRAMUSCULAR | Status: AC
  Filled 2021-02-26: qty 2

## 2021-02-26 MED ORDER — ONDANSETRON HCL 4 MG/2ML IJ SOLN: 4.0000 mg | Freq: Four times a day (QID) | INTRAMUSCULAR | Status: DC | PRN

## 2021-02-26 MED ORDER — OXYCODONE HCL 5 MG/5ML PO SOLN: 5.0000 mg | Freq: Once | ORAL | Status: DC | PRN

## 2021-02-26 MED ORDER — RANOLAZINE ER 500 MG PO TB12: 1000.0000 mg | ORAL_TABLET | Freq: Two times a day (BID) | ORAL | Status: DC

## 2021-02-26 MED ORDER — SODIUM CHLORIDE (PF) 0.9 % IJ SOLN
INTRAMUSCULAR | Status: AC
  Filled 2021-02-26: qty 10

## 2021-02-26 MED ORDER — PHENOL 1.4 % MT LIQD: 1.0000 | OROMUCOSAL | Status: DC | PRN

## 2021-02-26 MED ORDER — FENTANYL CITRATE PF 50 MCG/ML IJ SOSY: 50.0000 ug | PREFILLED_SYRINGE | INTRAMUSCULAR | Status: DC

## 2021-02-26 MED ORDER — FENTANYL CITRATE PF 50 MCG/ML IJ SOSY
PREFILLED_SYRINGE | INTRAMUSCULAR | Status: AC
  Administered 2021-02-26: 12:00:00 50 ug via INTRAVENOUS
  Filled 2021-02-26: qty 2

## 2021-02-26 MED ORDER — LIDOCAINE HCL (PF) 2 % IJ SOLN
INTRAMUSCULAR | Status: AC
  Filled 2021-02-26: qty 5

## 2021-02-26 MED ORDER — PROPOFOL 500 MG/50ML IV EMUL
INTRAVENOUS | Status: DC | PRN
  Administered 2021-02-26: 40 ug/kg/min via INTRAVENOUS

## 2021-02-26 MED ORDER — BISACODYL 10 MG RE SUPP: 10.0000 mg | Freq: Every day | RECTAL | Status: DC | PRN

## 2021-02-26 MED ORDER — METOCLOPRAMIDE HCL 5 MG PO TABS: 5.0000 mg | ORAL_TABLET | Freq: Three times a day (TID) | ORAL | Status: DC | PRN

## 2021-02-26 MED ORDER — CHLORHEXIDINE GLUCONATE 0.12 % MT SOLN: 15.0000 mL | Freq: Once | OROMUCOSAL | Status: DC

## 2021-02-26 MED ORDER — CILOSTAZOL 100 MG PO TABS
50.0000 mg | ORAL_TABLET | Freq: Two times a day (BID) | ORAL | Status: DC
  Administered 2021-02-26 – 2021-02-27 (×2): 50 mg via ORAL
  Filled 2021-02-26 (×2): qty 0.5
  Filled 2021-02-26: qty 1

## 2021-02-26 MED ORDER — ONDANSETRON HCL 4 MG/2ML IJ SOLN
INTRAMUSCULAR | Status: AC
  Filled 2021-02-26: qty 2

## 2021-02-26 MED ORDER — ONDANSETRON HCL 4 MG/2ML IJ SOLN
INTRAMUSCULAR | Status: DC | PRN
  Administered 2021-02-26: 4 mg via INTRAVENOUS

## 2021-02-26 MED ORDER — LORATADINE 10 MG PO TABS
10.0000 mg | ORAL_TABLET | Freq: Every day | ORAL | Status: DC
  Administered 2021-02-26 – 2021-02-27 (×2): 10 mg via ORAL
  Filled 2021-02-26: qty 1

## 2021-02-26 MED ORDER — POVIDONE-IODINE 10 % EX SWAB
2.0000 "application " | Freq: Once | CUTANEOUS | Status: AC
  Administered 2021-02-26: 2 via TOPICAL

## 2021-02-26 MED ORDER — ROPIVACAINE HCL 5 MG/ML IJ SOLN
INTRAMUSCULAR | Status: DC | PRN
  Administered 2021-02-26: 30 mL via PERINEURAL

## 2021-02-26 MED ORDER — DEXAMETHASONE SODIUM PHOSPHATE 10 MG/ML IJ SOLN
8.0000 mg | Freq: Once | INTRAMUSCULAR | Status: AC
  Administered 2021-02-26: 13:00:00 4 mg via INTRAVENOUS

## 2021-02-26 MED ORDER — RANOLAZINE ER 500 MG PO TB12
1000.0000 mg | ORAL_TABLET | Freq: Two times a day (BID) | ORAL | Status: DC
  Administered 2021-02-26 – 2021-02-27 (×2): 1000 mg via ORAL
  Filled 2021-02-26 (×2): qty 2

## 2021-02-26 MED ORDER — CYCLOBENZAPRINE HCL 10 MG PO TABS
10.0000 mg | ORAL_TABLET | Freq: Three times a day (TID) | ORAL | Status: DC | PRN
  Filled 2021-02-26: qty 1

## 2021-02-26 MED ORDER — SODIUM CHLORIDE 0.9 % IV SOLN: INTRAVENOUS | Status: DC

## 2021-02-26 MED ORDER — TRANEXAMIC ACID-NACL 1000-0.7 MG/100ML-% IV SOLN
1000.0000 mg | Freq: Once | INTRAVENOUS | Status: AC
  Administered 2021-02-26: 17:00:00 1000 mg via INTRAVENOUS
  Filled 2021-02-26: qty 100

## 2021-02-26 MED ORDER — STERILE WATER FOR IRRIGATION IR SOLN
Status: DC | PRN
  Administered 2021-02-26: 2000 mL

## 2021-02-26 MED ORDER — TICAGRELOR 60 MG PO TABS
60.0000 mg | ORAL_TABLET | Freq: Two times a day (BID) | ORAL | Status: DC
  Administered 2021-02-26 – 2021-02-27 (×2): 60 mg via ORAL
  Filled 2021-02-26 (×2): qty 1

## 2021-02-26 MED ORDER — PANTOPRAZOLE SODIUM 40 MG PO TBEC: 40.0000 mg | DELAYED_RELEASE_TABLET | Freq: Every day | ORAL | Status: DC | PRN

## 2021-02-26 MED ORDER — ONDANSETRON HCL 4 MG PO TABS: 4.0000 mg | ORAL_TABLET | Freq: Four times a day (QID) | ORAL | Status: DC | PRN

## 2021-02-26 MED ORDER — MIDAZOLAM HCL 2 MG/2ML IJ SOLN
INTRAMUSCULAR | Status: AC
  Administered 2021-02-26: 12:00:00 2 mg via INTRAVENOUS
  Filled 2021-02-26: qty 2

## 2021-02-26 MED ORDER — PROPOFOL 10 MG/ML IV BOLUS
INTRAVENOUS | Status: AC
  Filled 2021-02-26: qty 20

## 2021-02-26 MED ORDER — DICYCLOMINE HCL 10 MG PO CAPS
10.0000 mg | ORAL_CAPSULE | Freq: Four times a day (QID) | ORAL | Status: DC | PRN
  Filled 2021-02-26: qty 1

## 2021-02-26 MED ORDER — 0.9 % SODIUM CHLORIDE (POUR BTL) OPTIME
TOPICAL | Status: DC | PRN
  Administered 2021-02-26: 13:00:00 1000 mL

## 2021-02-26 MED ORDER — FERROUS SULFATE 325 (65 FE) MG PO TABS
325.0000 mg | ORAL_TABLET | Freq: Three times a day (TID) | ORAL | Status: DC
  Administered 2021-02-26 – 2021-02-27 (×2): 325 mg via ORAL
  Filled 2021-02-26 (×2): qty 1

## 2021-02-26 MED ORDER — KETOROLAC TROMETHAMINE 30 MG/ML IJ SOLN
INTRAMUSCULAR | Status: AC
  Filled 2021-02-26: qty 1

## 2021-02-26 MED ORDER — ONDANSETRON HCL 4 MG/2ML IJ SOLN: 4.0000 mg | Freq: Once | INTRAMUSCULAR | Status: DC | PRN

## 2021-02-26 SURGICAL SUPPLY — 55 items
ADH SKN CLS APL DERMABOND .7 (GAUZE/BANDAGES/DRESSINGS) ×1
ATTUNE MED ANAT PAT 32 KNEE (Knees) ×1 IMPLANT
ATTUNE MED ANAT PAT 32MM KNEE (Knees) ×1 IMPLANT
ATTUNE PSFEM LTSZ4 NARCEM KNEE (Femur) ×2 IMPLANT
ATTUNE PSRP INSR SZ4 7 KNEE (Insert) ×1 IMPLANT
ATTUNE PSRP INSR SZ4 7MM KNEE (Insert) ×1 IMPLANT
BAG COUNTER SPONGE SURGICOUNT (BAG) IMPLANT
BAG SPEC THK2 15X12 ZIP CLS (MISCELLANEOUS)
BAG SPNG CNTER NS LX DISP (BAG)
BAG SURGICOUNT SPONGE COUNTING (BAG)
BAG ZIPLOCK 12X15 (MISCELLANEOUS) IMPLANT
BASEPLATE TIBIAL ROTATING SZ 4 (Knees) ×2 IMPLANT
BLADE SAW SGTL 11.0X1.19X90.0M (BLADE) IMPLANT
BLADE SAW SGTL 13.0X1.19X90.0M (BLADE) ×3 IMPLANT
BLADE SURG SZ10 CARB STEEL (BLADE) ×6 IMPLANT
BNDG ELASTIC 6X5.8 VLCR STR LF (GAUZE/BANDAGES/DRESSINGS) ×3 IMPLANT
BOWL SMART MIX CTS (DISPOSABLE) ×3 IMPLANT
BSPLAT TIB 4 CMNT ROT PLAT STR (Knees) ×1 IMPLANT
CEMENT HV SMART SET (Cement) ×4 IMPLANT
CUFF TOURN SGL QUICK 34 (TOURNIQUET CUFF) ×3
CUFF TRNQT CYL 34X4.125X (TOURNIQUET CUFF) ×1 IMPLANT
DECANTER SPIKE VIAL GLASS SM (MISCELLANEOUS) ×6 IMPLANT
DERMABOND ADVANCED (GAUZE/BANDAGES/DRESSINGS) ×2
DERMABOND ADVANCED .7 DNX12 (GAUZE/BANDAGES/DRESSINGS) ×1 IMPLANT
DRAPE INCISE IOBAN 66X45 STRL (DRAPES) ×3 IMPLANT
DRAPE U-SHAPE 47X51 STRL (DRAPES) ×3 IMPLANT
DRESSING AQUACEL AG SP 3.5X10 (GAUZE/BANDAGES/DRESSINGS) ×1 IMPLANT
DRSG AQUACEL AG SP 3.5X10 (GAUZE/BANDAGES/DRESSINGS) ×3
DURAPREP 26ML APPLICATOR (WOUND CARE) ×6 IMPLANT
ELECT REM PT RETURN 15FT ADLT (MISCELLANEOUS) ×3 IMPLANT
GLOVE SURG ENC MOIS LTX SZ6 (GLOVE) ×3 IMPLANT
GLOVE SURG ENC MOIS LTX SZ7 (GLOVE) ×3 IMPLANT
GLOVE SURG UNDER POLY LF SZ7.5 (GLOVE) ×3 IMPLANT
GOWN STRL REUS W/TWL LRG LVL3 (GOWN DISPOSABLE) ×3 IMPLANT
HANDPIECE INTERPULSE COAX TIP (DISPOSABLE) ×3
HOLDER FOLEY CATH W/STRAP (MISCELLANEOUS) IMPLANT
KIT TURNOVER KIT A (KITS) IMPLANT
MANIFOLD NEPTUNE II (INSTRUMENTS) ×3 IMPLANT
NDL SAFETY ECLIPSE 18X1.5 (NEEDLE) IMPLANT
NEEDLE HYPO 18GX1.5 SHARP (NEEDLE)
NS IRRIG 1000ML POUR BTL (IV SOLUTION) ×3 IMPLANT
PACK TOTAL KNEE CUSTOM (KITS) ×3 IMPLANT
PROTECTOR NERVE ULNAR (MISCELLANEOUS) ×3 IMPLANT
SET HNDPC FAN SPRY TIP SCT (DISPOSABLE) ×1 IMPLANT
SET PAD KNEE POSITIONER (MISCELLANEOUS) ×3 IMPLANT
SUT MNCRL AB 4-0 PS2 18 (SUTURE) ×3 IMPLANT
SUT STRATAFIX PDS+ 0 24IN (SUTURE) ×3 IMPLANT
SUT VIC AB 1 CT1 36 (SUTURE) ×3 IMPLANT
SUT VIC AB 2-0 CT1 27 (SUTURE) ×9
SUT VIC AB 2-0 CT1 TAPERPNT 27 (SUTURE) ×3 IMPLANT
SYR 3ML LL SCALE MARK (SYRINGE) ×3 IMPLANT
TRAY FOLEY MTR SLVR 16FR STAT (SET/KITS/TRAYS/PACK) ×3 IMPLANT
TUBE SUCTION HIGH CAP CLEAR NV (SUCTIONS) ×3 IMPLANT
WATER STERILE IRR 1000ML POUR (IV SOLUTION) ×6 IMPLANT
WRAP KNEE MAXI GEL POST OP (GAUZE/BANDAGES/DRESSINGS) ×3 IMPLANT

## 2021-02-26 NOTE — Anesthesia Procedure Notes (Signed)
Procedure Name: MAC Date/Time: 02/26/2021 12:12 PM Performed by: Eben Burow, CRNA Pre-anesthesia Checklist: Patient identified, Emergency Drugs available, Suction available, Patient being monitored and Timeout performed Oxygen Delivery Method: Simple face mask Placement Confirmation: positive ETCO2

## 2021-02-26 NOTE — Anesthesia Procedure Notes (Signed)
Anesthesia Regional Block: Adductor canal block   Pre-Anesthetic Checklist: , timeout performed,  Correct Patient, Correct Site, Correct Laterality,  Correct Procedure, Correct Position, site marked,  Risks and benefits discussed,  Surgical consent,  Pre-op evaluation,  At surgeon's request and post-op pain management  Laterality: Left  Prep: chloraprep       Needles:  Injection technique: Single-shot  Needle Type: Echogenic Stimulator Needle     Needle Length: 10cm  Needle Gauge: 20     Additional Needles:   Procedures:,,,, ultrasound used (permanent image in chart),,    Narrative:  Start time: 02/26/2021 11:40 AM End time: 02/26/2021 11:44 AM Injection made incrementally with aspirations every 5 mL.  Performed by: Personally  Anesthesiologist: Lucretia Kern, MD  Additional Notes: Standard monitors applied. Skin prepped. Good needle visualization with ultrasound. Injection made in 5cc increments with no resistance to injection. Patient tolerated the procedure well.

## 2021-02-26 NOTE — Evaluation (Signed)
Physical Therapy Evaluation Patient Details Name: Tamara Nunez MRN: [id number] DOB: 04-Mar-1956 Today's Date: 02/26/2021  History of Present Illness  Patient is 65 y.o. female s/p Lt TKA on 02/26/21 with PMH significant for OA, CAD s/p CABGx2, back pain, fibromyalgia, HLD, HTN, DMII, gastric bypass.    Clinical Impression  Tamara Nunez is a 65 y.o. female POD 0 s/p Lt TKA. Patient reports independence with mobility at baseline. Patient is now limited by functional impairments (see PT problem list below). Patient limited this date by prolonged weakness secondary to spinal block and required min-mod assist for bed mobility and mod assist for transfers with RW. Patient unable to advance to gait training and returned to bed. Patient will benefit from continued skilled PT interventions to address impairments and progress towards PLOF. Acute PT will follow to progress mobility and stair training in preparation for safe discharge home.        Recommendations for follow up therapy are one component of a multi-disciplinary discharge planning process, led by the attending physician.  Recommendations may be updated based on patient status, additional functional criteria and insurance authorization.  Follow Up Recommendations Follow physician's recommendations for discharge plan and follow up therapies    Assistance Recommended at Discharge Frequent or constant Supervision/Assistance  Functional Status Assessment Patient has had a recent decline in their functional status and demonstrates the ability to make significant improvements in function in a reasonable and predictable amount of time.  Equipment Recommendations       Recommendations for Other Services       Precautions / Restrictions Precautions Precautions: Fall Restrictions Weight Bearing Restrictions: No Other Position/Activity Restrictions: WBAT      Mobility  Bed Mobility Overal bed mobility: Needs Assistance Bed Mobility:  Supine to Sit;Sit to Supine     Supine to sit: Min assist;HOB elevated Sit to supine: Mod assist   General bed mobility comments: cues to use bed rail to pivot/raie trunk to EOB, assist to bring Lt LE off edge. Mod assist to raise bil LE's onto bed and return to supine.    Transfers Overall transfer level: Needs assistance Equipment used: Rolling walker (2 wheels) Transfers: Sit to/from Stand Sit to Stand: Mod assist           General transfer comment: Mod assist to initaite and complete power up, pt unable to fully extend at hips due to weakness from spinal block. Assist for patient to return to sitting  EOB.    Ambulation/Gait                  Stairs            Wheelchair Mobility    Modified Rankin (Stroke Patients Only)       Balance Overall balance assessment: Needs assistance Sitting-balance support: Feet supported Sitting balance-Leahy Scale: Good     Standing balance support: Reliant on assistive device for balance;During functional activity;Bilateral upper extremity supported Standing balance-Leahy Scale: Zero                               Pertinent Vitals/Pain Pain Assessment: 0-10 Pain Score: 3  Pain Location: Lt knee Pain Descriptors / Indicators: Aching;Discomfort Pain Intervention(s): Limited activity within patient's tolerance;Monitored during session;Repositioned    Home Living Family/patient expects to be discharged to:: Private residence Living Arrangements: Spouse/significant other Available Help at Discharge: Family Type of Home: House Home Access: Stairs to enter Entrance Stairs-Rails: None  Entrance Stairs-Number of Steps: 3   Home Layout: One level Home Equipment: Shower seat;Grab bars - toilet      Prior Function Prior Level of Function : Independent/Modified Independent                     Hand Dominance   Dominant Hand: Right    Extremity/Trunk Assessment   Upper Extremity  Assessment Upper Extremity Assessment: Overall WFL for tasks assessed    Lower Extremity Assessment Lower Extremity Assessment: Generalized weakness;LLE deficits/detail LLE Deficits / Details: good quad activation, limited by pain and weakness secondary to spinal LLE Sensation: decreased light touch LLE Coordination: decreased gross motor    Cervical / Trunk Assessment Cervical / Trunk Assessment: Normal  Communication   Communication: No difficulties  Cognition Arousal/Alertness: Awake/alert Behavior During Therapy: WFL for tasks assessed/performed Overall Cognitive Status: Within Functional Limits for tasks assessed                                          General Comments      Exercises     Assessment/Plan    PT Assessment Patient needs continued PT services  PT Problem List Decreased strength;Decreased range of motion;Decreased activity tolerance;Decreased balance;Decreased mobility;Decreased knowledge of precautions;Decreased knowledge of use of DME       PT Treatment Interventions DME instruction;Gait training;Stair training;Functional mobility training;Therapeutic activities;Therapeutic exercise;Balance training;Neuromuscular re-education;Patient/family education    PT Goals (Current goals can be found in the Care Plan section)  Acute Rehab PT Goals Patient Stated Goal: get recovered PT Goal Formulation: With patient Time For Goal Achievement: 03/05/21 Potential to Achieve Goals: Good    Frequency 7X/week   Barriers to discharge        Co-evaluation               AM-PAC PT "6 Clicks" Mobility  Outcome Measure Help needed turning from your back to your side while in a flat bed without using bedrails?: A Little Help needed moving from lying on your back to sitting on the side of a flat bed without using bedrails?: A Little Help needed moving to and from a bed to a chair (including a wheelchair)?: Total Help needed standing up from a  chair using your arms (e.g., wheelchair or bedside chair)?: A Lot Help needed to walk in hospital room?: Total Help needed climbing 3-5 steps with a railing? : Total 6 Click Score: 11    End of Session Equipment Utilized During Treatment: Gait belt Activity Tolerance: Patient tolerated treatment well Patient left: in bed;with call bell/phone within reach;with bed alarm set;with family/visitor present Nurse Communication: Mobility status PT Visit Diagnosis: Muscle weakness (generalized) (M62.81);Difficulty in walking, not elsewhere classified (R26.2)    Time: 8502-7741 PT Time Calculation (min) (ACUTE ONLY): 26 min   Charges:   PT Evaluation $PT Eval Low Complexity: 1 Low PT Treatments $Therapeutic Activity: 8-22 mins        [name], DPT Acute Rehabilitation Services Office [phone] Pager [phone]   [name] 02/26/2021, 7:07 PM

## 2021-02-26 NOTE — Interval H&P Note (Signed)
History and Physical Interval Note:  02/26/2021 10:54 AM  Tamara Nunez  has presented today for surgery, with the diagnosis of Left knee osteoarthritis.  The various methods of treatment have been discussed with the patient and family. After consideration of risks, benefits and other options for treatment, the patient has consented to  Procedure(s): TOTAL KNEE ARTHROPLASTY (Left) as a surgical intervention.  The patient's history has been reviewed, patient examined, no change in status, stable for surgery.  I have reviewed the patient's chart and labs.  Questions were answered to the patient's satisfaction.     Shelda Pal

## 2021-02-26 NOTE — Plan of Care (Signed)
Discussed with patient and husband about plan of care for post-op day 0. ° ° °Will continue to monitor patient.  ° ° °SWhittemore, RN ° °

## 2021-02-26 NOTE — Plan of Care (Signed)
  Problem: Education: Goal: Knowledge of the prescribed therapeutic regimen will improve Outcome: Progressing   

## 2021-02-26 NOTE — Transfer of Care (Signed)
Immediate Anesthesia Transfer of Care Note  Patient: Tamara Nunez  Procedure(s) Performed: TOTAL KNEE ARTHROPLASTY (Left: Knee)  Patient Location: PACU  Anesthesia Type:Spinal  Level of Consciousness: awake, alert  and patient cooperative  Airway & Oxygen Therapy: Patient Spontanous Breathing and Patient connected to face mask oxygen  Post-op Assessment: Report given to RN and Post -op Vital signs reviewed and stable  Post vital signs: Reviewed and stable  Last Vitals:  Vitals Value Taken Time  BP 144/67 02/26/21 1404  Temp    Pulse 64 02/26/21 1406  Resp 14 02/26/21 1408  SpO2 94 % 02/26/21 1406  Vitals shown include unvalidated device data.  Last Pain:  Vitals:   02/26/21 1024  TempSrc:   PainSc: 7       Patients Stated Pain Goal: 4 (02/26/21 1024)  Complications: No notable events documented.

## 2021-02-26 NOTE — Progress Notes (Signed)
AssistedDr. Carolyn Witman with left, ultrasound guided, adductor canal block. Side rails up, monitors on throughout procedure. See vital signs in flow sheet. Tolerated Procedure well.  

## 2021-02-26 NOTE — Anesthesia Procedure Notes (Signed)
Spinal  Patient location during procedure: OR Reason for block: surgical anesthesia Staffing Performed: anesthesiologist  Anesthesiologist: Samyuktha Brau E, MD Preanesthetic Checklist Completed: patient identified, IV checked, risks and benefits discussed, surgical consent, monitors and equipment checked, pre-op evaluation and timeout performed Spinal Block Patient position: sitting Prep: DuraPrep and site prepped and draped Patient monitoring: continuous pulse ox, blood pressure and heart rate Approach: midline Location: L3-4 Injection technique: single-shot Needle Needle type: Quincke  Needle gauge: 22 G Needle length: 10 cm Assessment Events: CSF return Additional Notes Functioning IV was confirmed and monitors were applied. Sterile prep and drape, including hand hygiene and sterile gloves were used. The patient was positioned and the spine was prepped. The skin was anesthetized with lidocaine.  Free flow of clear CSF was obtained prior to injecting local anesthetic into the CSF. The needle was carefully withdrawn. The patient tolerated the procedure well.     

## 2021-02-26 NOTE — Anesthesia Postprocedure Evaluation (Signed)
Anesthesia Post Note  Patient: NAHIARA KRETZSCHMAR  Procedure(s) Performed: TOTAL KNEE ARTHROPLASTY (Left: Knee)     Patient location during evaluation: PACU Anesthesia Type: Spinal Level of consciousness: oriented and awake and alert Pain management: pain level controlled Vital Signs Assessment: post-procedure vital signs reviewed and stable Respiratory status: spontaneous breathing, respiratory function stable and nonlabored ventilation Cardiovascular status: blood pressure returned to baseline and stable Postop Assessment: no headache, no backache, no apparent nausea or vomiting and spinal receding Anesthetic complications: no   No notable events documented.  Last Vitals:  Vitals:   02/26/21 1500 02/26/21 1515  BP: (!) 151/72 (!) 149/70  Pulse: (!) 54 (!) 53  Resp: 14 13  Temp: (!) 36.4 C   SpO2: 98% 98%    Last Pain:  Vitals:   02/26/21 1515  TempSrc:   PainSc: 0-No pain                 Lucretia Kern

## 2021-02-26 NOTE — Op Note (Signed)
NAME:  Tamara Nunez                      MEDICAL RECORD NO.:  [id number]                             FACILITY:  [facility]      PHYSICIAN:  Madlyn Frankel. Charlann Boxer, M.D.  DATE OF BIRTH:  [birth year]      DATE OF PROCEDURE:  02/26/2021                                     OPERATIVE REPORT         PREOPERATIVE DIAGNOSIS:  Left knee osteoarthritis.      POSTOPERATIVE DIAGNOSIS:  Left knee osteoarthritis.      FINDINGS:  The patient was noted to have complete loss of cartilage and   bone-on-bone arthritis with associated osteophytes in the medial and patellofemoral compartments of   the knee with a significant synovitis and associated effusion.  The patient had failed months of conservative treatment including medications, injection therapy, activity modification.     PROCEDURE:  Left total knee replacement.      COMPONENTS USED:  DePuy Attune rotating platform posterior stabilized knee   system, a size 4N femur, 4 tibia, size 7 mm PS AOX insert, and 32 anatomic patellar   button.      SURGEON:  Madlyn Frankel. Charlann Boxer, M.D.      ASSISTANT:  Rosalene Billings, PA-C.      ANESTHESIA:  Regional and Spinal.      SPECIMENS:  None.      COMPLICATION:  None.      DRAINS:  None.  EBL: <100 cc      TOURNIQUET TIME:   Total Tourniquet Time Documented: Thigh (Left) - 30 minutes Total: Thigh (Left) - 30 minutes  .      The patient was stable to the recovery room.      INDICATION FOR PROCEDURE:  Tamara Nunez is a 65 y.o. female patient of   mine.  The patient had been seen, evaluated, and treated for months conservatively in the   office with medication, activity modification, and injections.  The patient had   radiographic changes of bone-on-bone arthritis with endplate sclerosis and osteophytes noted.  Based on the radiographic changes and failed conservative measures, the patient   decided to proceed with definitive treatment, total knee replacement.  Risks of infection, DVT, component failure,  need for revision surgery, neurovascular injury were reviewed in the office setting.  The postop course was reviewed stressing the efforts to maximize post-operative satisfaction and function.  Consent was obtained for benefit of pain   relief.      PROCEDURE IN DETAIL:  The patient was brought to the operative theater.   Once adequate anesthesia, preoperative antibiotics, 2 gm of Ancef,1 gm of Tranexamic Acid, and 10 mg of Decadron administered, the patient was positioned supine with a left thigh tourniquet placed.  The  left lower extremity was prepped and draped in sterile fashion.  A time-   out was performed identifying the patient, planned procedure, and the appropriate extremity.      The left lower extremity was placed in the [facility] leg holder.  The leg was   exsanguinated, tourniquet elevated to 250 mmHg.  A midline incision was  made followed by median parapatellar arthrotomy.  Following initial   exposure, attention was first directed to the patella.  Precut   measurement was noted to be 22 mm.  I resected down to 13 mm and used a   32 anatomic patellar button to restore patellar height as well as cover the cut surface.      The lug holes were drilled and a metal shim was placed to protect the   patella from retractors and saw blade during the procedure.      At this point, attention was now directed to the femur.  The femoral   canal was opened with a drill, irrigated to try to prevent fat emboli.  An   intramedullary rod was passed at 3 degrees valgus, 9 mm of bone was   resected off the distal femur.  Following this resection, the tibia was   subluxated anteriorly.  Using the extramedullary guide, 2 mm of bone was resected off   the proximal medial tibia.  We confirmed the gap would be   stable medially and laterally with a size 6 spacer block as well as confirmed that the tibial cut was perpendicular in the coronal plane, checking with an alignment rod.      Once this was  done, I sized the femur to be a size 4 in the anterior-   posterior dimension, chose a narrow component based on medial and   lateral dimension.  The size 4 rotation block was then pinned in   position anterior referenced using the C-clamp to set rotation.  The   anterior, posterior, and  chamfer cuts were made without difficulty nor   notching making certain that I was along the anterior cortex to help   with flexion gap stability.      The final box cut was made off the lateral aspect of distal femur.      At this point, the tibia was sized to be a size 4.  The size 4 tray was   then pinned in position through the medial third of the tubercle,   drilled, and keel punched.  Trial reduction was now carried with a 4 femur,  4 tibia, a size 7 mm PS insert, and the 32 anatomic patella botton.  The knee was brought to full extension with good flexion stability with the patella   tracking through the trochlea without application of pressure.  Given   all these findings the trial components removed.  Final components were   opened and cement was mixed.  The knee was irrigated with normal saline solution and pulse lavage.  The synovial lining was   then injected with 30 cc of 0.25% Marcaine with epinephrine, 1 cc of Toradol and 30 cc of NS for a total of 61 cc.     Final implants were then cemented onto cleaned and dried cut surfaces of bone with the knee brought to extension with a size 7 mm PS trial insert.      Once the cement had fully cured, excess cement was removed   throughout the knee.  I confirmed that I was satisfied with the range of   motion and stability, and the final size 7 mm PS AOX insert was chosen.  It was   placed into the knee.      The tourniquet had been let down at 30 minutes.  No significant   hemostasis was required.  The extensor mechanism was then reapproximated using #1 Vicryl  and #1 Stratafix sutures with the knee   in flexion.  The   remaining wound was closed  with 2-0 Vicryl and running 4-0 Monocryl.   The knee was cleaned, dried, dressed sterilely using Dermabond and   Aquacel dressing.  The patient was then   brought to recovery room in stable condition, tolerating the procedure   well.   Please note that Physician Assistant, Rosalene Billings, PA-C was present for the entirety of the case, and was utilized for pre-operative positioning, peri-operative retractor management, general facilitation of the procedure and for primary wound closure at the end of the case.              Madlyn Frankel Charlann Boxer, M.D.    02/26/2021 1:44 PM

## 2021-02-26 NOTE — Discharge Instructions (Signed)

## 2021-02-27 ENCOUNTER — Encounter (HOSPITAL_COMMUNITY): Payer: Self-pay | Admitting: Orthopedic Surgery

## 2021-02-27 DIAGNOSIS — M1712 Unilateral primary osteoarthritis, left knee: Secondary | ICD-10-CM | POA: Diagnosis not present

## 2021-02-27 LAB — BASIC METABOLIC PANEL
Anion gap: 6 (ref 5–15)
BUN: 15 mg/dL (ref 8–23)
CO2: 26 mmol/L (ref 22–32)
Calcium: 8.5 mg/dL — ABNORMAL LOW (ref 8.9–10.3)
Chloride: 106 mmol/L (ref 98–111)
Creatinine, Ser: 0.82 mg/dL (ref 0.44–1.00)
GFR, Estimated: >60 mL/min (ref >60)
Glucose, Bld: 103 mg/dL — ABNORMAL HIGH (ref 70–99)
Potassium: 4.4 mmol/L (ref 3.5–5.1)
Sodium: 138 mmol/L (ref 135–145)

## 2021-02-27 LAB — CBC
HCT: 28.7 % — ABNORMAL LOW (ref 36.0–46.0)
Hemoglobin: 9 g/dL — ABNORMAL LOW (ref 12.0–15.0)
MCH: 26.1 pg (ref 26.0–34.0)
MCHC: 31.4 g/dL (ref 30.0–36.0)
MCV: 83.2 fL (ref 80.0–100.0)
Platelets: 252 10*3/uL (ref 150–400)
RBC: 3.45 MIL/uL — ABNORMAL LOW (ref 3.87–5.11)
RDW: 17.2 % — ABNORMAL HIGH (ref 11.5–15.5)
WBC: 11 10*3/uL — ABNORMAL HIGH (ref 4.0–10.5)
nRBC: 0 % (ref 0.0–0.2)

## 2021-02-27 MED ORDER — POLYETHYLENE GLYCOL 3350 17 G PO PACK: 17.0000 g | PACK | Freq: Every day | ORAL | 0 refills | Status: AC | PRN

## 2021-02-27 MED ORDER — HYDROCODONE-ACETAMINOPHEN 7.5-325 MG PO TABS: 1.0000 | ORAL_TABLET | Freq: Four times a day (QID) | ORAL | 0 refills | Status: DC | PRN

## 2021-02-27 NOTE — Plan of Care (Signed)
°  Problem: Education: Goal: Knowledge of the prescribed therapeutic regimen will improve Outcome: Adequate for Discharge Goal: Individualized Educational Video(s) Outcome: Adequate for Discharge   Problem: Activity: Goal: Ability to avoid complications of mobility impairment will improve Outcome: Adequate for Discharge Goal: Range of joint motion will improve Outcome: Adequate for Discharge   Problem: Clinical Measurements: Goal: Postoperative complications will be avoided or minimized Outcome: Adequate for Discharge   Problem: Pain Management: Goal: Pain level will decrease with appropriate interventions Outcome: Adequate for Discharge   Problem: Skin Integrity: Goal: Will show signs of wound healing Outcome: Adequate for Discharge   Problem: Acute Rehab PT Goals(only PT should resolve) Goal: Pt Will Go Supine/Side To Sit Outcome: Adequate for Discharge Goal: Patient Will Transfer Sit To/From Stand Outcome: Adequate for Discharge Goal: Pt Will Ambulate Outcome: Adequate for Discharge Goal: Pt Will Go Up/Down Stairs Outcome: Adequate for Discharge

## 2021-02-27 NOTE — Progress Notes (Signed)
Physical Therapy Treatment Patient Details Name: Tamara Nunez MRN: [id number] DOB: 1955/05/20 Today's Date: 02/27/2021   History of Present Illness Patient is 65 y.o. female s/p Lt TKA on 02/26/21 with PMH significant for OA, CAD s/p CABGx2, back pain, fibromyalgia, HLD, HTN, DMII, gastric bypass.    PT Comments    Patient progressing well with mobility and able to advance to transfers and gait training this session. Pt required cues for technique and min guard for safety. Ambulated ~100' with min guard and no buckling/LOB noted. Educated on HEP for ROM and circulation. Acute PT will follow up with pt for additional gait/stair training.    Recommendations for follow up therapy are one component of a multi-disciplinary discharge planning process, led by the attending physician.  Recommendations may be updated based on patient status, additional functional criteria and insurance authorization.  Follow Up Recommendations  Follow physician's recommendations for discharge plan and follow up therapies     Assistance Recommended at Discharge Frequent or constant Supervision/Assistance  Equipment Recommendations       Recommendations for Other Services       Precautions / Restrictions Precautions Precautions: Fall Restrictions Weight Bearing Restrictions: No Other Position/Activity Restrictions: WBAT     Mobility  Bed Mobility Overal bed mobility: Needs Assistance Bed Mobility: Supine to Sit     Supine to sit: HOB elevated;Min guard     General bed mobility comments: guarding for safety, no assist needed for LE's and pt using bed rail to pivot. pt taking increased time.    Transfers Overall transfer level: Needs assistance Equipment used: Rolling walker (2 wheels) Transfers: Sit to/from Stand Sit to Stand: Min guard           General transfer comment: guarding for safety with power up, pt using bil UE's for rise and steady once standing.     Ambulation/Gait Ambulation/Gait assistance: Min assist;Min guard Gait Distance (Feet): 100 Feet Assistive device: Rolling walker (2 wheels) Gait Pattern/deviations: Step-to pattern;Decreased stride length;Decreased weight shift to left Gait velocity: decr     General Gait Details: cues for step to pattern and proiximity to RW, no buckling at Lt knee and no LOB noted.   Stairs             Wheelchair Mobility    Modified Rankin (Stroke Patients Only)       Balance Overall balance assessment: Needs assistance Sitting-balance support: Feet supported Sitting balance-Leahy Scale: Good     Standing balance support: Reliant on assistive device for balance;During functional activity;Bilateral upper extremity supported Standing balance-Leahy Scale: Zero                              Cognition Arousal/Alertness: Awake/alert Behavior During Therapy: WFL for tasks assessed/performed Overall Cognitive Status: Within Functional Limits for tasks assessed                                          Exercises Total Joint Exercises Ankle Circles/Pumps: AROM;Both;15 reps;Seated Quad Sets: AROM;Left;5 reps;Seated Heel Slides: AROM;Left;5 reps;Seated    General Comments        Pertinent Vitals/Pain Pain Assessment: 0-10 Pain Score: 7  Pain Location: Lt knee Pain Descriptors / Indicators: Aching;Discomfort    Home Living  Prior Function            PT Goals (current goals can now be found in the care plan section) Acute Rehab PT Goals Patient Stated Goal: get recovered PT Goal Formulation: With patient Time For Goal Achievement: 03/05/21 Potential to Achieve Goals: Good Progress towards PT goals: Progressing toward goals    Frequency    7X/week      PT Plan Current plan remains appropriate    Co-evaluation              AM-PAC PT "6 Clicks" Mobility   Outcome Measure  Help needed  turning from your back to your side while in a flat bed without using bedrails?: A Little Help needed moving from lying on your back to sitting on the side of a flat bed without using bedrails?: A Little Help needed moving to and from a bed to a chair (including a wheelchair)?: Total Help needed standing up from a chair using your arms (e.g., wheelchair or bedside chair)?: A Lot Help needed to walk in hospital room?: Total Help needed climbing 3-5 steps with a railing? : Total 6 Click Score: 11    End of Session Equipment Utilized During Treatment: Gait belt Activity Tolerance: Patient tolerated treatment well Patient left: in bed;with call bell/phone within reach;with bed alarm set;with family/visitor present Nurse Communication: Mobility status PT Visit Diagnosis: Muscle weakness (generalized) (M62.81);Difficulty in walking, not elsewhere classified (R26.2)     Time: 3212-2482 PT Time Calculation (min) (ACUTE ONLY): 28 min  Charges:  $Gait Training: 8-22 mins $Therapeutic Exercise: 8-22 mins                     Tamara Nunez, DPT Acute Rehabilitation Services Office [phone] Pager [phone]    Tamara Nunez 02/27/2021, 1:28 PM

## 2021-02-27 NOTE — TOC Transition Note (Signed)
Transition of Care (TOC) - CM/SW Discharge Note ° ° °Patient Details  °Name: Tamara Nunez °MRN: [id number] °Date of Birth: 10/25/1955 ° °Transition of Care (TOC) CM/SW Contact:  °HOYLE, LUCY, LCSW °Phone Number: °02/27/2021, 10:43 AM ° ° °Clinical Narrative:    °Met with pt and confirming need for rw and bsc - ordered already placed with [employer] for delivery to room.  Plan for OPPT at [facility] No further TOC needs. ° °Final next level of care: OP Rehab °Barriers to Discharge: No Barriers Identified ° ° °Patient Goals and CMS Choice °Patient states their goals for this hospitalization and ongoing recovery are:: return home °  °  ° °Discharge Placement °  °           °  °  °  °  ° °Discharge Plan and Services °  °  °           °DME Arranged: 3-N-1, Walker rolling °DME Agency: [employer] °Date DME Agency Contacted:  (prearranged via MD office) °  °  °  °  °  °  °  ° °Social Determinants of Health (SDOH) Interventions °  ° ° °Readmission Risk Interventions °Readmission Risk Prevention Plan 01/31/2019  °Post Dischage Appt Complete  °Medication Screening Complete  °Transportation Screening Complete  °Some recent data might be hidden  ° ° ° ° ° °

## 2021-02-27 NOTE — Progress Notes (Signed)
Subjective: 1 Day Post-Op Procedure(s) (LRB): TOTAL KNEE ARTHROPLASTY (Left) Patient reports pain as mild.   Patient seen in rounds by Dr. Charlann Boxer. Patient is well, and has had no acute complaints or problems. No acute events overnight. Foley catheter removed. She reports she is doing well.  We will start therapy today.   Objective: Vital signs in last 24 hours: Temp:  [97.3 F (36.3 C)-98.1 F (36.7 C)] 98 F (36.7 C) (12/21 0541) Pulse Rate:  [53-82] 70 (12/21 0541) Resp:  [11-18] 16 (12/21 0541) BP: (116-151)/(55-81) 127/63 (12/21 0541) SpO2:  [98 %-100 %] 99 % (12/21 0541) Weight:  [78 kg] 78 kg (12/20 1024)  Intake/Output from previous day:  Intake/Output Summary (Last 24 hours) at 02/27/2021 0723 Last data filed at 02/27/2021 4259 Gross per 24 hour  Intake 2372.37 ml  Output 2375 ml  Net -2.63 ml     Intake/Output this shift: No intake/output data recorded.  Labs: Recent Labs    02/27/21 0333  HGB 9.0*   Recent Labs    02/27/21 0333  WBC 11.0*  RBC 3.45*  HCT 28.7*  PLT 252   Recent Labs    02/27/21 0333  NA 138  K 4.4  CL 106  CO2 26  BUN 15  CREATININE 0.82  GLUCOSE 103*  CALCIUM 8.5*   No results for input(s): LABPT, INR in the last 72 hours.  Exam: General - Patient is Alert and Oriented Extremity - Neurologically intact Sensation intact distally Intact pulses distally Dorsiflexion/Plantar flexion intact Dressing - dressing C/D/I Motor Function - intact, moving foot and toes well on exam.   Past Medical History:  Diagnosis Date   Arthritis    "in my back" (05/06/2017)   CAD (coronary artery disease)    CABG 2006, 09/2016 DES RCA, 04/2017 DES Circ, 06/2018 DES 1st diag   Chronic back pain    Coronary artery disease    a. s/p CABG in 2006 with LIMA-LAD, SVG-dLAD. b. cath in 2009 showing patent grafts. c. 09/2016: cath with occluded LAD but patent SVG-dLAD. LIMA was atretic. pRCA with 80% stenosis --> treated with DES   Fibromyalgia     GERD (gastroesophageal reflux disease)    Hyperlipidemia    Hypertension    Type II diabetes mellitus (HCC)     Assessment/Plan: 1 Day Post-Op Procedure(s) (LRB): TOTAL KNEE ARTHROPLASTY (Left) Principal Problem:   S/P total knee arthroplasty, left  Estimated body mass index is 29.52 kg/m as calculated from the following:   Height as of this encounter: 5\' 4"  (1.626 m).   Weight as of this encounter: 78 kg. Advance diet Up with therapy D/C IV fluids   Patient's anticipated LOS is less than 2 midnights, meeting these requirements: - Younger than 16 - Lives within 1 hour of care - Has a competent adult at home to recover with post-op recover - NO history of  - Chronic pain requiring opiods  - Diabetes  - Coronary Artery Disease  - Heart failure  - Heart attack  - Stroke  - DVT/VTE  - Cardiac arrhythmia  - Respiratory Failure/COPD  - Renal failure  - Anemia  - Advanced Liver disease     DVT Prophylaxis - Aspirin and brilinta Weight bearing as tolerated.  Hgb stable at 9.0.  Plan is to go Home after hospital stay. Plan for discharge today following 1-2 sessions of PT as long as they are meeting their goals. Patient is scheduled for OPPT. Follow up in the office in  2 weeks.   Dennie Bible, PA-C Orthopedic Surgery [phone] 02/27/2021, 7:23 AM

## 2021-02-27 NOTE — Progress Notes (Signed)
Physical Therapy Treatment Patient Details Name: Tamara Nunez MRN: [id number] DOB: 12-31-1955 Today's Date: 02/27/2021   History of Present Illness Patient is 64 y.o. female s/p Lt TKA on 02/26/21 with PMH significant for OA, CAD s/p CABGx2, back pain, fibromyalgia, HLD, HTN, DMII, gastric bypass.    PT Comments    Patient continues to progress well with mobility. Pt demonstrated good recall for safe walker management for transfers and gait. Pt completed stair mobility with RW and cues provided for forward/backward sequencing. Pt more stable with reverse step up pattern, cues for husband to guard during stair mobility. Reviewed remainder of HEP for ROM and circulation, and addressed all questions for discharge home. Patient is at safe level to discharge with assist from family. Acute PT will continue to progress as able.    Recommendations for follow up therapy are one component of a multi-disciplinary discharge planning process, led by the attending physician.  Recommendations may be updated based on patient status, additional functional criteria and insurance authorization.  Follow Up Recommendations  Follow physician's recommendations for discharge plan and follow up therapies     Assistance Recommended at Discharge Frequent or constant Supervision/Assistance  Equipment Recommendations       Recommendations for Other Services       Precautions / Restrictions Precautions Precautions: Fall Restrictions Weight Bearing Restrictions: No Other Position/Activity Restrictions: WBAT     Mobility  Bed Mobility               General bed mobility comments: pt OOB in recliner    Transfers Overall transfer level: Needs assistance Equipment used: Rolling walker (2 wheels) Transfers: Sit to/from Stand Sit to Stand: Min guard;Supervision           General transfer comment: pt with good recall for safe technique with power up from recliner and toilet.     Ambulation/Gait Ambulation/Gait assistance: Min guard;Supervision Gait Distance (Feet): 40 Feet Assistive device: Rolling walker (2 wheels) Gait Pattern/deviations: Step-to pattern;Decreased stride length;Decreased weight shift to left Gait velocity: decr     General Gait Details: pt demonstrated safe proximity to RW, no overt LOB noted and pt's spouse provided safe guarding throughout with cues from therapist.   Stairs Stairs: Yes Stairs assistance: Supervision;Min guard Stair Management: No rails;Step to pattern;Forwards;Backwards;With walker Number of Stairs: 6 General stair comments: cues for sequencing with RW, forwards and backwards pattern. No LOB noted and pt's husband provided safe guarding throughout. pt completed with noe LOB or buckling, improved confidence with reverse step up pattern.   Wheelchair Mobility    Modified Rankin (Stroke Patients Only)       Balance Overall balance assessment: Needs assistance Sitting-balance support: Feet supported Sitting balance-Leahy Scale: Good     Standing balance support: Reliant on assistive device for balance;During functional activity;Bilateral upper extremity supported Standing balance-Leahy Scale: Zero                              Cognition Arousal/Alertness: Awake/alert Behavior During Therapy: WFL for tasks assessed/performed Overall Cognitive Status: Within Functional Limits for tasks assessed                                          Exercises Total Joint Exercises Ankle Circles/Pumps: AROM;Both;15 reps;Seated Quad Sets: AROM;Left;5 reps;Seated Short Arc Quad: AROM;Left;5 reps;Seated Heel Slides: AROM;Left;5 reps;Seated Hip ABduction/ADduction: AROM;Left;5  reps;Seated Straight Leg Raises: AROM;Left;5 reps;Seated    General Comments        Pertinent Vitals/Pain Pain Assessment: 0-10 Pain Score: 6  Pain Location: Lt knee Pain Descriptors / Indicators:  Aching;Discomfort Pain Intervention(s): Limited activity within patient's tolerance;Monitored during session;Repositioned;Ice applied    Home Living                          Prior Function            PT Goals (current goals can now be found in the care plan section) Acute Rehab PT Goals Patient Stated Goal: get recovered PT Goal Formulation: With patient Time For Goal Achievement: 03/05/21 Potential to Achieve Goals: Good Progress towards PT goals: Progressing toward goals    Frequency    7X/week      PT Plan Current plan remains appropriate    Co-evaluation              AM-PAC PT "6 Clicks" Mobility   Outcome Measure  Help needed turning from your back to your side while in a flat bed without using bedrails?: A Little Help needed moving from lying on your back to sitting on the side of a flat bed without using bedrails?: A Little Help needed moving to and from a bed to a chair (including a wheelchair)?: A Little Help needed standing up from a chair using your arms (e.g., wheelchair or bedside chair)?: A Little Help needed to walk in hospital room?: A Little Help needed climbing 3-5 steps with a railing? : A Little 6 Click Score: 18    End of Session Equipment Utilized During Treatment: Gait belt Activity Tolerance: Patient tolerated treatment well Patient left: in bed;with call bell/phone within reach;with bed alarm set;with family/visitor present Nurse Communication: Mobility status PT Visit Diagnosis: Muscle weakness (generalized) (M62.81);Difficulty in walking, not elsewhere classified (R26.2)     Time: 8850-2774 PT Time Calculation (min) (ACUTE ONLY): 30 min  Charges:  $Gait Training: 8-22 mins $Therapeutic Exercise: 8-22 mins                     [name], DPT Acute Rehabilitation Services Office [phone] Pager [phone]    [name] 02/27/2021, 1:42 PM

## 2021-03-01 NOTE — Discharge Summary (Addendum)
Patient ID: Tamara Nunez MRN: [id number] DOB/AGE: [birth year] 65 y.o.  Admit date: 02/26/2021 Discharge date: 02/27/21  Admission Diagnoses:  Left knee osteoarthritis.    Discharge Diagnoses:  Same  Past Medical History:  Diagnosis Date   Arthritis    "in my back" (05/06/2017)   CAD (coronary artery disease)    CABG 2006, 09/2016 DES RCA, 04/2017 DES Circ, 06/2018 DES 1st diag   Chronic back pain    Coronary artery disease    a. s/p CABG in 2006 with LIMA-LAD, SVG-dLAD. b. cath in 2009 showing patent grafts. c. 09/2016: cath with occluded LAD but patent SVG-dLAD. LIMA was atretic. pRCA with 80% stenosis --> treated with DES   Fibromyalgia    GERD (gastroesophageal reflux disease)    Hyperlipidemia    Hypertension    Type II diabetes mellitus (HCC)     Surgeries: Procedure(s): TOTAL KNEE ARTHROPLASTY on 02/26/2021   Consultants:   Discharged Condition: Improved  Hospital Course: Tamara Nunez is an 65 y.o. female who was admitted 02/26/2021 for operative treatment ofS/P total knee arthroplasty, left. Patient has severe unremitting pain that affects sleep, daily activities, and work/hobbies. After pre-op clearance the patient was taken to the operating room on 02/26/2021 and underwent  Procedure(s): TOTAL KNEE ARTHROPLASTY.    Patient was given perioperative antibiotics:  Anti-infectives (From admission, onward)    Start     Dose/Rate Route Frequency Ordered Stop   02/26/21 1900  ceFAZolin (ANCEF) IVPB 2g/100 mL premix        2 g 200 mL/hr over 30 Minutes Intravenous Every 6 hours 02/26/21 1546 02/27/21 0147   02/26/21 1015  ceFAZolin (ANCEF) IVPB 2g/100 mL premix        2 g 200 mL/hr over 30 Minutes Intravenous On call to O.R. 02/26/21 1003 02/26/21 1255        Patient was given sequential compression devices, early ambulation, and chemoprophylaxis to prevent DVT. Patient worked with PT and was meeting their goals regarding safe ambulation and transfers.  Patient  benefited maximally from hospital stay and there were no complications.    Recent vital signs: No data found.   Recent laboratory studies:  Recent Labs    02/27/21 0333  WBC 11.0*  HGB 9.0*  HCT 28.7*  PLT 252  NA 138  K 4.4  CL 106  CO2 26  BUN 15  CREATININE 0.82  GLUCOSE 103*  CALCIUM 8.5*     Discharge Medications:   Allergies as of 02/27/2021       Reactions   Codeine Hives, Shortness Of Breath      Other Anaphylaxis   Nuts   Ciprofloxacin Itching   Valsartan Rash   Adhesive [tape] Itching   "clear tape" used after surgery."took all her skin off".  Paper take is ok   Sulfa Antibiotics Nausea Only        Medication List     STOP taking these medications    acetaminophen 500 MG tablet Commonly known as: TYLENOL       TAKE these medications    amLODipine 2.5 MG tablet Commonly known as: NORVASC Take 1 tablet (2.5 mg total) by mouth daily.   aspirin EC 81 MG tablet Take 81 mg by mouth daily.   atorvastatin 40 MG tablet Commonly known as: LIPITOR Take 1 tablet (40 mg total) by mouth daily.   cetirizine 10 MG tablet Commonly known as: ZYRTEC Take 10 mg by mouth daily.   Cholecalciferol 50 MCG (2000 UT)  Caps Take 1 capsule (2,000 Units total) by mouth daily with breakfast.   cilostazol 50 MG tablet Commonly known as: PLETAL Take 1 tablet (50 mg total) by mouth 2 (two) times daily.   Co Q-10 200 MG Caps Take 200 mg by mouth daily.   cyclobenzaprine 10 MG tablet Commonly known as: FLEXERIL Take 10 mg by mouth 3 (three) times daily as needed for muscle spasms.   diclofenac sodium 1 % Gel Commonly known as: VOLTAREN Apply 2 g topically in the morning and at bedtime.   dicyclomine 10 MG capsule Commonly known as: BENTYL Take 10 mg by mouth every 6 (six) hours as needed for spasms.   Fingerstix Lancets Misc Inject into the skin.   fluticasone 50 MCG/ACT nasal spray Commonly known as: FLONASE Place 1 spray into both nostrils 2  (two) times daily.   furosemide 40 MG tablet Commonly known as: LASIX Take 40 mg by mouth daily as needed for fluid or edema.   gabapentin 600 MG tablet Commonly known as: NEURONTIN Take 600 mg by mouth 3 (three) times daily.   HYDROcodone-acetaminophen 7.5-325 MG tablet Commonly known as: NORCO Take 1-2 tablets by mouth every 6 (six) hours as needed for severe pain (pain score 7-10).   isosorbide mononitrate 30 MG 24 hr tablet Commonly known as: IMDUR Take 1 tablet (30 mg total) by mouth daily.   metoprolol tartrate 50 MG tablet Commonly known as: LOPRESSOR Take 1 tablet (50 mg total) by mouth 2 (two) times daily.   multivitamin with minerals Tabs tablet Take 1 tablet by mouth daily.   nitroGLYCERIN 0.4 MG SL tablet Commonly known as: NITROSTAT Place 1 tablet (0.4 mg total) under the tongue every 5 (five) minutes as needed for chest pain (up to 3 doses).   OneTouch Ultra test strip Generic drug: glucose blood Test BG qid   pantoprazole 40 MG tablet Commonly known as: PROTONIX Take 40 mg by mouth daily as needed (acid reflux).   polyethylene glycol 17 g packet Commonly known as: MIRALAX / GLYCOLAX Take 17 g by mouth daily as needed for mild constipation.   ranolazine 1000 MG SR tablet Commonly known as: Ranexa Take 1 tablet (1,000 mg total) by mouth 2 (two) times daily.   STOOL SOFTENER PO Take 100 mg by mouth daily.   ticagrelor 60 MG Tabs tablet Commonly known as: BRILINTA Take 1 tablet (60 mg total) by mouth 2 (two) times daily.   vitamin B-12 1000 MCG tablet Commonly known as: CYANOCOBALAMIN Take 1,000 mcg by mouth daily.   vitamin E 180 MG (400 UNITS) capsule Take 400 Units by mouth daily.   VITRON-C PO Take 1 tablet by mouth daily.               Discharge Care Instructions  (From admission, onward)           Start     Ordered   02/27/21 0000  Change dressing       Comments: Maintain surgical dressing until follow up in the clinic.  If the edges start to pull up, may reinforce with tape. If the dressing is no longer working, may remove and cover with gauze and tape, but must keep the area dry and clean.  Call with any questions or concerns.   02/27/21 0729            Diagnostic Studies: No results found.  Disposition: Discharge disposition: 01-Home or Self Care       Discharge Instructions  Call MD / Call 911   Complete by: As directed    If you experience chest pain or shortness of breath, CALL 911 and be transported to the hospital emergency room.  If you develope a fever above 101 F, pus (white drainage) or increased drainage or redness at the wound, or calf pain, call your surgeon's office.   Change dressing   Complete by: As directed    Maintain surgical dressing until follow up in the clinic. If the edges start to pull up, may reinforce with tape. If the dressing is no longer working, may remove and cover with gauze and tape, but must keep the area dry and clean.  Call with any questions or concerns.   Constipation Prevention   Complete by: As directed    Drink plenty of fluids.  Prune juice may be helpful.  You may use a stool softener, such as Colace (over the counter) 100 mg twice a day.  Use MiraLax (over the counter) for constipation as needed.   Diet - low sodium heart healthy   Complete by: As directed    Increase activity slowly as tolerated   Complete by: As directed    Weight bearing as tolerated with assist device (walker, cane, etc) as directed, use it as long as suggested by your surgeon or therapist, typically at least 4-6 weeks.   Post-operative opioid taper instructions:   Complete by: As directed    POST-OPERATIVE OPIOID TAPER INSTRUCTIONS: It is important to wean off of your opioid medication as soon as possible. If you do not need pain medication after your surgery it is ok to stop day one. Opioids include: Codeine, Hydrocodone(Norco, Vicodin), Oxycodone(Percocet, oxycontin) and  hydromorphone amongst others.  Long term and even short term use of opiods can cause: Increased pain response Dependence Constipation Depression Respiratory depression And more.  Withdrawal symptoms can include Flu like symptoms Nausea, vomiting And more Techniques to manage these symptoms Hydrate well Eat regular healthy meals Stay active Use relaxation techniques(deep breathing, meditating, yoga) Do Not substitute Alcohol to help with tapering If you have been on opioids for less than two weeks and do not have pain than it is ok to stop all together.  Plan to wean off of opioids This plan should start within one week post op of your joint replacement. Maintain the same interval or time between taking each dose and first decrease the dose.  Cut the total daily intake of opioids by one tablet each day Next start to increase the time between doses. The last dose that should be eliminated is the evening dose.      TED hose   Complete by: As directed    Use stockings (TED hose) for 2 weeks on both leg(s).  You may remove them at night for sleeping.        Follow-up Information     Durene Romans, MD. Schedule an appointment as soon as possible for a visit in 2 week(s).   Specialty: Orthopedic Surgery Contact information: [address] [place] [address] [place] [address] [phone]                  Signed: Cassandria Anger 03/01/2021, 9:42 AM

## 2021-03-04 ENCOUNTER — Emergency Department (HOSPITAL_COMMUNITY): Payer: Medicare PPO

## 2021-03-04 ENCOUNTER — Emergency Department (HOSPITAL_COMMUNITY)
Admission: EM | Admit: 2021-03-04 | Discharge: 2021-03-04 | Disposition: A | Discharge status: Home / Self Care | Payer: Medicare PPO | Attending: Emergency Medicine | Admitting: Emergency Medicine

## 2021-03-04 ENCOUNTER — Emergency Department (HOSPITAL_BASED_OUTPATIENT_CLINIC_OR_DEPARTMENT_OTHER)
Admission: EM | Admit: 2021-03-04 | Discharge: 2021-03-04 | Disposition: A | Discharge status: Home / Self Care | Payer: Medicare PPO | Source: Home / Self Care | Attending: Emergency Medicine | Admitting: Emergency Medicine

## 2021-03-04 ENCOUNTER — Encounter (HOSPITAL_COMMUNITY): Payer: Self-pay

## 2021-03-04 ENCOUNTER — Other Ambulatory Visit: Payer: Self-pay

## 2021-03-04 DIAGNOSIS — M79662 Pain in left lower leg: Secondary | ICD-10-CM

## 2021-03-04 DIAGNOSIS — Z79899 Other long term (current) drug therapy: Secondary | ICD-10-CM | POA: Insufficient documentation

## 2021-03-04 DIAGNOSIS — G8918 Other acute postprocedural pain: Secondary | ICD-10-CM | POA: Insufficient documentation

## 2021-03-04 DIAGNOSIS — E119 Type 2 diabetes mellitus without complications: Secondary | ICD-10-CM | POA: Insufficient documentation

## 2021-03-04 DIAGNOSIS — Z96652 Presence of left artificial knee joint: Secondary | ICD-10-CM | POA: Insufficient documentation

## 2021-03-04 DIAGNOSIS — M25462 Effusion, left knee: Secondary | ICD-10-CM | POA: Diagnosis not present

## 2021-03-04 DIAGNOSIS — M25562 Pain in left knee: Secondary | ICD-10-CM | POA: Insufficient documentation

## 2021-03-04 DIAGNOSIS — I10 Essential (primary) hypertension: Secondary | ICD-10-CM | POA: Diagnosis not present

## 2021-03-04 DIAGNOSIS — I2510 Atherosclerotic heart disease of native coronary artery without angina pectoris: Secondary | ICD-10-CM | POA: Diagnosis not present

## 2021-03-04 DIAGNOSIS — Z951 Presence of aortocoronary bypass graft: Secondary | ICD-10-CM | POA: Insufficient documentation

## 2021-03-04 DIAGNOSIS — M7989 Other specified soft tissue disorders: Secondary | ICD-10-CM

## 2021-03-04 DIAGNOSIS — Z7982 Long term (current) use of aspirin: Secondary | ICD-10-CM | POA: Diagnosis not present

## 2021-03-04 LAB — CBC WITH DIFFERENTIAL/PLATELET
Abs Immature Granulocytes: 0.13 10*3/uL — ABNORMAL HIGH (ref 0.00–0.07)
Basophils Absolute: 0.1 10*3/uL (ref 0.0–0.1)
Basophils Relative: 1 %
Eosinophils Absolute: 0.5 10*3/uL (ref 0.0–0.5)
Eosinophils Relative: 5 %
HCT: 26.6 % — ABNORMAL LOW (ref 36.0–46.0)
Hemoglobin: 8.2 g/dL — ABNORMAL LOW (ref 12.0–15.0)
Immature Granulocytes: 1 %
Lymphocytes Relative: 19 %
Lymphs Abs: 1.7 10*3/uL (ref 0.7–4.0)
MCH: 26.2 pg (ref 26.0–34.0)
MCHC: 30.8 g/dL (ref 30.0–36.0)
MCV: 85 fL (ref 80.0–100.0)
Monocytes Absolute: 1.1 10*3/uL — ABNORMAL HIGH (ref 0.1–1.0)
Monocytes Relative: 12 %
Neutro Abs: 5.7 10*3/uL (ref 1.7–7.7)
Neutrophils Relative %: 62 %
Platelets: 374 10*3/uL (ref 150–400)
RBC: 3.13 MIL/uL — ABNORMAL LOW (ref 3.87–5.11)
RDW: 18.2 % — ABNORMAL HIGH (ref 11.5–15.5)
WBC: 9.2 10*3/uL (ref 4.0–10.5)
nRBC: 0.5 % — ABNORMAL HIGH (ref 0.0–0.2)

## 2021-03-04 LAB — BASIC METABOLIC PANEL
Anion gap: 6 (ref 5–15)
BUN: 14 mg/dL (ref 8–23)
CO2: 24 mmol/L (ref 22–32)
Calcium: 8.5 mg/dL — ABNORMAL LOW (ref 8.9–10.3)
Chloride: 107 mmol/L (ref 98–111)
Creatinine, Ser: 0.8 mg/dL (ref 0.44–1.00)
GFR, Estimated: >60 mL/min (ref >60)
Glucose, Bld: 87 mg/dL (ref 70–99)
Potassium: 3.8 mmol/L (ref 3.5–5.1)
Sodium: 137 mmol/L (ref 135–145)

## 2021-03-04 LAB — C-REACTIVE PROTEIN: CRP: 12.1 mg/dL — ABNORMAL HIGH (ref <1.0)

## 2021-03-04 LAB — SEDIMENTATION RATE: Sed Rate: 106 mm/hr — ABNORMAL HIGH (ref 0–22)

## 2021-03-04 MED ORDER — SODIUM CHLORIDE 0.9 % IV BOLUS
500.0000 mL | Freq: Once | INTRAVENOUS | Status: AC
  Administered 2021-03-04: 15:00:00 500 mL via INTRAVENOUS

## 2021-03-04 MED ORDER — FENTANYL CITRATE PF 50 MCG/ML IJ SOSY
100.0000 ug | PREFILLED_SYRINGE | Freq: Once | INTRAMUSCULAR | Status: AC
  Administered 2021-03-04: 17:00:00 100 ug via INTRAVENOUS
  Filled 2021-03-04: qty 2

## 2021-03-04 MED ORDER — IOHEXOL 350 MG/ML SOLN
80.0000 mL | Freq: Once | INTRAVENOUS | Status: AC | PRN
  Administered 2021-03-04: 17:00:00 80 mL via INTRAVENOUS

## 2021-03-04 MED ORDER — LIDOCAINE 5 % EX PTCH: 1.0000 | MEDICATED_PATCH | Freq: Every day | CUTANEOUS | 0 refills | Status: AC | PRN

## 2021-03-04 MED ORDER — IBUPROFEN 800 MG PO TABS: 800.0000 mg | ORAL_TABLET | Freq: Four times a day (QID) | ORAL | 0 refills | Status: DC | PRN

## 2021-03-04 MED ORDER — FENTANYL CITRATE PF 50 MCG/ML IJ SOSY
50.0000 ug | PREFILLED_SYRINGE | Freq: Once | INTRAMUSCULAR | Status: AC
  Administered 2021-03-04: 15:00:00 50 ug via INTRAVENOUS
  Filled 2021-03-04: qty 1

## 2021-03-04 NOTE — ED Provider Notes (Signed)
[facility] DEPT Provider Note   CSN: [id number] Arrival date & time: 03/04/21  2841     History Chief Complaint  Patient presents with   Post-op Problem    Tamara Nunez is a 65 y.o. female.  HPI  65 year old female presents emergency department with concern for left knee pain and swelling.  She is status post left total knee surgery with Dr. Alvan Dame on 02/26/2021.  She states she is been undergoing physical therapy and taking pain medicine as prescribed with a left knee/lower extremity and calf have become more swollen, painful, at times red.  Yesterday she endorsed subjective fever and chills with no documented fever.  She states that she feels fatigued and generally unwell.  No foot discoloration/numbness.  No reinjury to the leg.  No drainage from the wound.  Past Medical History:  Diagnosis Date   Arthritis    "in my back" (05/06/2017)   CAD (coronary artery disease)    CABG 2006, 09/2016 DES RCA, 04/2017 DES Circ, 06/2018 DES 1st diag   Chronic back pain    Coronary artery disease    a. s/p CABG in 2006 with LIMA-LAD, SVG-dLAD. b. cath in 2009 showing patent grafts. c. 09/2016: cath with occluded LAD but patent SVG-dLAD. LIMA was atretic. pRCA with 80% stenosis --> treated with DES   Fibromyalgia    GERD (gastroesophageal reflux disease)    Hyperlipidemia    Hypertension    Type II diabetes mellitus ([place])     Patient Active Problem List   Diagnosis Date Noted   S/P total knee arthroplasty, left 02/26/2021   Peripheral arterial disease ([place]) 02/24/2020   Chronic anemia 08/17/2019   Type 4a myocardial infarction ([place]) 08/17/2019   CAD (coronary artery disease) 08/12/2019   Degenerative lumbar spinal stenosis 01/28/2019   Vitamin D deficiency 11/01/2018   Unstable angina ([place]) 06/25/2018   Bilateral lower extremity edema 11/02/2017   CAD S/P percutaneous coronary angioplasty  05/07/2017   Unstable angina pectoris ([place])    Preoperative  cardiovascular examination 03/11/2017   Essential hypertension, benign    Mixed hyperlipidemia    GERD (gastroesophageal reflux disease)    Diabetes mellitus without complication ([place])    Coronary artery disease    Coronary artery disease involving coronary bypass graft of native heart with unstable angina pectoris ([place]) 10/27/2016   Other fatigue 10/27/2016   DOE (dyspnea on exertion) 10/27/2016   Progressive angina (HCC)    Atherosclerosis of native coronary artery of native heart without angina pectoris 11/29/2012   S/P CABG x 2 11/29/2012   Stable angina ([place]) 11/29/2012   HTN (hypertension) 11/29/2012   Dyslipidemia 11/29/2012   DM type 2 causing vascular disease ([place]) 11/29/2012    Past Surgical History:  Procedure Laterality Date   ABDOMINAL HERNIA REPAIR  2018   BACK SURGERY     CARDIAC CATHETERIZATION  01/13/2008   COLONOSCOPY     CORONARY ANGIOPLASTY WITH STENT PLACEMENT  09/26/2016   Prox RCA lesion, 80 %stenosed. DES    CORONARY ARTERY BYPASS GRAFT  12/23/2004   2 vessel - LIMA to LAD (atretic), SVG to distal LAD   CORONARY ATHERECTOMY N/A 08/16/2019   Procedure: CORONARY ATHERECTOMY;  Surgeon: Martinique, Peter M, MD;  Location: [facility];  Service: Cardiovascular;  Laterality: N/A;   CORONARY STENT INTERVENTION N/A 09/26/2016   Procedure: Coronary Stent Intervention;  Surgeon: Leonie Man, MD;  Location: [facility];  Service: Cardiovascular;  Laterality: N/A;  CORONARY STENT INTERVENTION N/A 05/06/2017   Procedure: CORONARY STENT INTERVENTION;  Surgeon: Burnell Blanks, MD;  Location: [facility];  Service: Cardiovascular;  Laterality: N/A;   CORONARY STENT INTERVENTION N/A 06/25/2018   Procedure: CORONARY STENT INTERVENTION;  Surgeon: Martinique, Peter M, MD;  Location: [facility];  Service: Cardiovascular;  Laterality: N/A;   CORONARY STENT INTERVENTION N/A 08/12/2019   Procedure: CORONARY STENT INTERVENTION;  Surgeon: Belva Crome, MD;  Location: [facility];  Service: Cardiovascular;  Laterality: N/A;   CORONARY STENT INTERVENTION N/A 08/16/2019   Procedure: CORONARY STENT INTERVENTION;  Surgeon: Martinique, Peter M, MD;  Location: [facility];  Service: Cardiovascular;  Laterality: N/A;   DOPPLER ECHOCARDIOGRAPHY  04/12/2008   EF 60%   GASTRIC BYPASS  2009   HERNIA REPAIR     LEFT HEART CATH AND CORONARY ANGIOGRAPHY N/A 05/06/2017   Procedure: LEFT HEART CATH AND CORONARY ANGIOGRAPHY;  Surgeon: Burnell Blanks, MD;  Location: [facility];  Service: Cardiovascular;  Laterality: N/A;   LEFT HEART CATH AND CORS/GRAFTS ANGIOGRAPHY N/A 09/26/2016   Procedure: Left Heart Cath and Cors/Grafts Angiography;  Surgeon: Leonie Man, MD;  Location: [facility];  Service: Cardiovascular;  Laterality: N/A;   LEFT HEART CATH AND CORS/GRAFTS ANGIOGRAPHY N/A 06/25/2018   Procedure: LEFT HEART CATH AND CORS/GRAFTS ANGIOGRAPHY;  Surgeon: Martinique, Peter M, MD;  Location: [facility];  Service: Cardiovascular;  Laterality: N/A;   LEFT HEART CATH AND CORS/GRAFTS ANGIOGRAPHY N/A 08/12/2019   Procedure: LEFT HEART CATH AND CORS/GRAFTS ANGIOGRAPHY;  Surgeon: Belva Crome, MD;  Location: [facility];  Service: Cardiovascular;  Laterality: N/A;   LEFT HEART CATH AND CORS/GRAFTS ANGIOGRAPHY N/A 03/08/2020   Procedure: LEFT HEART CATH AND CORS/GRAFTS ANGIOGRAPHY;  Surgeon: Martinique, Peter M, MD;  Location: [facility];  Service: Cardiovascular;  Laterality: N/A;   NM MYOVIEW LTD  06/03/2012   EF 71%   POSTERIOR FUSION LUMBAR SPINE  1990s   TOTAL KNEE ARTHROPLASTY Left 02/26/2021   Procedure: TOTAL KNEE ARTHROPLASTY;  Surgeon: Paralee Cancel, MD;  Location: [facility];  Service: Orthopedics;  Laterality: Left;   TUBAL LIGATION     VAGINAL HYSTERECTOMY     "partial"     OB History   No obstetric history on file.     Family History  Problem Relation Age of Onset   Heart disease Mother    Cancer  - Lung Father     Social History   Tobacco Use   Smoking status: Never   Smokeless tobacco: Never  Vaping Use   Vaping Use: Never used  Substance Use Topics   Alcohol use: No   Drug use: No    Home Medications Prior to Admission medications   Medication Sig Start Date End Date Taking? Authorizing Provider  amLODipine (NORVASC) 2.5 MG tablet Take 1 tablet (2.5 mg total) by mouth daily. 08/26/19 02/26/21  Almyra Deforest, PA  aspirin EC 81 MG tablet Take 81 mg by mouth daily.    [provider]  atorvastatin (LIPITOR) 40 MG tablet Take 1 tablet (40 mg total) by mouth daily. 08/17/19   Dunn, Nedra Hai, PA-C  cetirizine (ZYRTEC) 10 MG tablet Take 10 mg by mouth daily.    [provider]  Cholecalciferol 50 MCG (2000 UT) CAPS Take 1 capsule (2,000 Units total) by mouth daily with breakfast. Patient not taking: Reported on 02/08/2021 01/17/21   Nida,  Marella Chimes, MD  cilostazol (PLETAL) 50 MG tablet Take 1 tablet (50 mg total) by mouth 2 (two) times daily. 07/04/20   Lorretta Harp, MD  Coenzyme Q10 (CO Q-10) 200 MG CAPS Take 200 mg by mouth daily.    [provider]  cyclobenzaprine (FLEXERIL) 10 MG tablet Take 10 mg by mouth 3 (three) times daily as needed for muscle spasms. 06/28/20   [provider]  diclofenac sodium (VOLTAREN) 1 % GEL Apply 2 g topically in the morning and at bedtime. 10/31/18   [provider]  dicyclomine (BENTYL) 10 MG capsule Take 10 mg by mouth every 6 (six) hours as needed for spasms. 01/06/20   [provider]  Docusate Calcium (STOOL SOFTENER PO) Take 100 mg by mouth daily.    [provider]  Fingerstix Lancets MISC Inject into the skin. 03/23/19   [provider]  fluticasone (FLONASE) 50 MCG/ACT nasal spray Place 1 spray into both nostrils 2 (two) times daily.    [provider]  furosemide (LASIX) 40 MG tablet Take 40 mg by mouth daily as needed for fluid or edema.    [provider]  gabapentin (NEURONTIN) 600 MG tablet Take 600 mg by mouth 3 (three) times daily.  09/07/17   [provider]  HYDROcodone-acetaminophen (NORCO) 7.5-325 MG tablet Take 1-2 tablets by mouth every 6 (six) hours as needed for severe pain (pain score 7-10). 02/27/21   Irving Copas, PA-C  Iron-Vitamin C (VITRON-C PO) Take 1 tablet by mouth daily.    [provider]  isosorbide mononitrate (IMDUR) 30 MG 24 hr tablet Take 1 tablet (30 mg total) by mouth daily. 12/04/20   Almyra Deforest, PA  metoprolol tartrate (LOPRESSOR) 50 MG tablet Take 1 tablet (50 mg total) by mouth 2 (two) times daily. 06/13/20   Almyra Deforest, PA  Multiple Vitamin (MULTIVITAMIN WITH MINERALS) TABS tablet Take 1 tablet by mouth daily.    [provider]  nitroGLYCERIN (NITROSTAT) 0.4 MG SL tablet Place 1 tablet (0.4 mg total) under the tongue every 5 (five) minutes as needed for chest pain (up to 3 doses). 08/17/19   Dunn, Nedra Hai, PA-C  ONETOUCH ULTRA test strip Test BG qid 12/29/18   Cassandria Anger, MD  pantoprazole (PROTONIX) 40 MG tablet Take 40 mg by mouth daily as needed (acid reflux).     [provider]  polyethylene glycol (MIRALAX / GLYCOLAX) 17 g packet Take 17 g by mouth daily as needed for mild constipation. 02/27/21   Irving Copas, PA-C  ranolazine (RANEXA) 1000 MG SR tablet Take 1 tablet (1,000 mg total) by mouth 2 (two) times daily. 06/16/13   Hilty, Nadean Corwin, MD  ticagrelor (BRILINTA) 60 MG TABS tablet Take 1 tablet (60 mg total) by mouth 2 (two) times daily. 02/07/21   Hilty, Nadean Corwin, MD  vitamin B-12 (CYANOCOBALAMIN) 1000 MCG tablet Take 1,000 mcg by mouth daily.    [provider]  vitamin E 400 UNIT capsule Take 400 Units by mouth daily.    [provider]    Allergies    Codeine, Other, Ciprofloxacin, Valsartan, Adhesive [tape], and Sulfa antibiotics  Review of Systems   Review of Systems  Constitutional:  Positive for chills, fatigue and  fever.  HENT:  Negative for congestion.   Eyes:  Negative for visual disturbance.  Respiratory:  Negative for shortness of breath.   Cardiovascular:  Negative for chest pain.  Gastrointestinal:  Negative for  abdominal pain, diarrhea and vomiting.  Genitourinary:  Negative for dysuria.  Musculoskeletal:        Left knee and lower extremity swelling, pain, redness  Skin:  Negative for rash.  Neurological:  Negative for headaches.   Physical Exam Updated Vital Signs BP 132/67 (BP Location: Right Arm)    Pulse 80    Temp 97.6 F (36.4 C) (Oral)    Resp 18    Ht 5' 4"  (1.626 m)    Wt 78.9 kg    SpO2 100%    BMI 29.87 kg/m   Physical Exam Vitals and nursing note reviewed.  Constitutional:      Appearance: Normal appearance.  HENT:     Head: Normocephalic.     Mouth/Throat:     Mouth: Mucous membranes are moist.  Cardiovascular:     Rate and Rhythm: Normal rate.  Pulmonary:     Effort: Pulmonary effort is normal. No respiratory distress.  Abdominal:     Palpations: Abdomen is soft.     Tenderness: There is no abdominal tenderness.  Musculoskeletal:     Comments: Left knee to the ankle is edematous, tender to palpation, swelling calf, palpable peripheral pulses, no significant redness/warmth, no discoloration or signs of cyanosis, left knee incision is well-healing, clean, no discharge  Skin:    General: Skin is warm.  Neurological:     Mental Status: She is alert and oriented to person, place, and time. Mental status is at baseline.  Psychiatric:        Mood and Affect: Mood normal.    ED Results / Procedures / Treatments   Labs (all labs ordered are listed, but only abnormal results are displayed) Labs Reviewed  CBC WITH DIFFERENTIAL/PLATELET  BASIC METABOLIC PANEL  SEDIMENTATION RATE  C-REACTIVE PROTEIN    EKG None  Radiology No results found.  Procedures Procedures   Medications Ordered in ED Medications  sodium chloride 0.9 % bolus 500 mL (has no  administration in time range)  fentaNYL (SUBLIMAZE) injection 50 mcg (has no administration in time range)    ED Course  I have reviewed the triage vital signs and the nursing notes.  Pertinent labs & imaging results that were available during my care of the patient were reviewed by me and considered in my medical decision making (see chart for details).    MDM Rules/Calculators/A&P                          65 year old female presents emergency department with left knee pain.  Status post surgery on 02/26/2021, total knee with Dr. Ihor Gully.  The left lower extremity is swollen, erythematous, warm.  She is afebrile.  Normal white count on blood work.  ESR is elevated over 100.  XR shows no acute findings.  DVT study is negative.  Plan for CT of the knee for further evaluation, orthopedic consultation.  Patient signed out to Dr. Pearline Cables pending CT.     Final Clinical Impression(s) / ED Diagnoses Final diagnoses:  None    Rx / DC Orders ED Discharge Orders     None        Lorelle Gibbs, DO 03/04/21 1736

## 2021-03-04 NOTE — ED Provider Notes (Signed)
Provider Note MRN:  [id number]  Arrival date & time: 03/05/21    ED Course and Medical Decision Making  Assumed care from Dr Dina Rich at shift change.  See not from prior team for complete details, in brief: prior left total knee w/ Dr. Alvan Dame on 02/26/2021, pain/swelling post op; gradually worsening. Subjective fever yestd. No ongoing fever. No drainage from wound. -DVT, -XR. Afebrile in the ED. ESR elevated. CT knee pending. D/w ortho after CT results.   CT knee w/ large joint effusion, gas. Mild fat stranding. No discrete abscess on imaging.  Will d/w [facility] team on call. Discussed with PA, given labs and stable vitals and time frame of presentation, doubt infection/septic joint. Recommends pt call Dr Alvan Dame office tomorrow morning to arrange for follow up and to discuss further pain control.  This was a difficult patient encounter.  The patient's expectations as to management were not able to be met given the guidelines from the state, hospital guidelines, and the patient's personal care guidelines. Patient requesting stronger narcotics and antibiotics. I discussed at length with the patient my concerns and offered treatment options that were in keeping with our policies and procedures. My suspicion for infection is very low currently, [facility] on call PA also agrees. Pt is afebrile, no leukocytosis, wound itself does not appear to be acutely infected. Unfortunately this did not alleviate the patient's concerns. Patient became belligerent when I attempted to discuss pain control modalities at home and threatening posturing towards myself. At all times, myself and the staff were respectful of the patient, attempted to solve their issues within our constraints, and treated the patient with all due courtesy.  Unfortunately the patient left disappointed as to their expectations.  The patient received medically appropriate treatment for the objective findings.  I encouraged them to follow up with  their primary care provider for further treatment and to return to the ED if they had any other symptoms or concerns.  Procedures         Final Clinical Impressions(s) / ED Diagnoses     ICD-10-CM   1. Post-operative pain  G89.18       ED Discharge Orders          Ordered    ibuprofen (ADVIL) 800 MG tablet  Every 6 hours PRN,   Status:  Discontinued        03/04/21 1846    lidocaine (LIDODERM) 5 %  Daily PRN        03/04/21 1846              Discharge Instructions      Please call Dr Alvan Dame tomorrow morning in regards to your knee pain, follow up care. Continue to take your home pain medications as prescribed. Return to the Emergency Department for any worsening or worrisome symptoms.           Jeanell Sparrow, DO 03/05/21 [phone]

## 2021-03-04 NOTE — ED Notes (Signed)
Vascular US at bedside.

## 2021-03-04 NOTE — Discharge Instructions (Addendum)
Please call Dr Charlann Boxer tomorrow morning in regards to your knee pain, follow up care. Continue to take your home pain medications as prescribed. Return to the Emergency Department for any worsening or worrisome symptoms.

## 2021-03-04 NOTE — Progress Notes (Signed)
Left lower extremity venous duplex completed. Refer to "CV Proc" under chart review to view preliminary results.  03/04/2021 10:49 AM Eula Fried., MHA, RVT, RDCS, RDMS

## 2021-03-04 NOTE — ED Triage Notes (Signed)
Patient reports that she had left knee surgery on 02/26/21. Patient states she has increased pain and swelling to the left knee.

## 2021-03-04 NOTE — ED Notes (Signed)
Refused Covid swab.

## 2021-03-27 ENCOUNTER — Telehealth: Payer: Self-pay | Admitting: Internal Medicine

## 2021-03-27 NOTE — Telephone Encounter (Signed)
Patient calling the office for samples of medication:   1.  What medication and dosage are you requesting samples for?  ticagrelor (BRILINTA) 60 MG TABS tablet Take 1 tablet (60 mg total) by mouth 2 (two) times daily.    2.  Are you currently out of this medication?   Pt is almost

## 2021-03-27 NOTE — Telephone Encounter (Signed)
Returned call to pt she states that she needs samples she is having insurance issues and needs samples. Informed pt that sample inventory is very low due to COVID. Pt notified. Samples at the front desk along with Pt assistance forms

## 2021-03-29 ENCOUNTER — Ambulatory Visit: Payer: Medicare PPO | Admitting: Internal Medicine

## 2021-04-08 ENCOUNTER — Other Ambulatory Visit: Payer: Self-pay

## 2021-04-08 MED ORDER — TICAGRELOR 60 MG PO TABS: 60.0000 mg | ORAL_TABLET | Freq: Two times a day (BID) | ORAL | 3 refills | Status: DC

## 2021-04-18 ENCOUNTER — Telehealth: Payer: Self-pay | Admitting: Internal Medicine

## 2021-04-18 NOTE — Telephone Encounter (Signed)
Patient assistance application for Brilinta faxed to [employer] to [phone]

## 2021-04-23 ENCOUNTER — Encounter: Payer: Self-pay | Admitting: "Endocrinology

## 2021-04-23 ENCOUNTER — Ambulatory Visit (INDEPENDENT_AMBULATORY_CARE_PROVIDER_SITE_OTHER): Payer: Medicare Other | Admitting: "Endocrinology

## 2021-04-23 ENCOUNTER — Other Ambulatory Visit: Payer: Self-pay

## 2021-04-23 VITALS — BP 112/76 | HR 72 | Ht 64.0 in | Wt 173.2 lb

## 2021-04-23 DIAGNOSIS — I10 Essential (primary) hypertension: Secondary | ICD-10-CM

## 2021-04-23 DIAGNOSIS — E782 Mixed hyperlipidemia: Secondary | ICD-10-CM

## 2021-04-23 DIAGNOSIS — E1159 Type 2 diabetes mellitus with other circulatory complications: Secondary | ICD-10-CM

## 2021-04-23 MED ORDER — ACCU-CHEK GUIDE VI STRP: ORAL_STRIP | 2 refills | Status: DC

## 2021-04-23 MED ORDER — ACCU-CHEK GUIDE ME W/DEVICE KIT: 1.0000 | PACK | 0 refills | Status: DC

## 2021-04-23 NOTE — Patient Instructions (Signed)

## 2021-04-23 NOTE — Progress Notes (Signed)
04/23/2021, 12:31 PM            Endocrinology follow-up note  Subjective:    Patient ID: Tamara Nunez, female    DOB: [birth year].  Tamara Nunez is being seen in follow-up for type 2 diabetes which is currently controlled on diet and exercise with A1c of 6.3%.    PMD:   Tamara Manila, MD.   Past Medical History:  Diagnosis Date   Arthritis    "in my back" (05/06/2017)   CAD (coronary artery disease)    CABG 2006, 09/2016 DES RCA, 04/2017 DES Circ, 06/2018 DES 1st diag   Chronic back pain    Coronary artery disease    a. s/p CABG in 2006 with LIMA-LAD, SVG-dLAD. b. cath in 2009 showing patent grafts. c. 09/2016: cath with occluded LAD but patent SVG-dLAD. LIMA was atretic. pRCA with 80% stenosis --> treated with DES   Fibromyalgia    GERD (gastroesophageal reflux disease)    Hyperlipidemia    Hypertension    Type II diabetes mellitus ([place])     Past Surgical History:  Procedure Laterality Date   ABDOMINAL HERNIA REPAIR  2018   BACK SURGERY     CARDIAC CATHETERIZATION  01/13/2008   COLONOSCOPY     CORONARY ANGIOPLASTY WITH STENT PLACEMENT  09/26/2016   Prox RCA lesion, 80 %stenosed. DES    CORONARY ARTERY BYPASS GRAFT  12/23/2004   2 vessel - LIMA to LAD (atretic), SVG to distal LAD   CORONARY ATHERECTOMY N/A 08/16/2019   Procedure: CORONARY ATHERECTOMY;  Surgeon: Martinique, Peter M, MD;  Location: [facility];  Service: Cardiovascular;  Laterality: N/A;   CORONARY STENT INTERVENTION N/A 09/26/2016   Procedure: Coronary Stent Intervention;  Surgeon: Leonie Man, MD;  Location: [facility];  Service: Cardiovascular;  Laterality: N/A;   CORONARY STENT INTERVENTION N/A 05/06/2017   Procedure: CORONARY STENT INTERVENTION;  Surgeon: Burnell Blanks, MD;  Location: [facility];  Service: Cardiovascular;  Laterality: N/A;   CORONARY STENT INTERVENTION N/A  06/25/2018   Procedure: CORONARY STENT INTERVENTION;  Surgeon: Martinique, Peter M, MD;  Location: [facility];  Service: Cardiovascular;  Laterality: N/A;   CORONARY STENT INTERVENTION N/A 08/12/2019   Procedure: CORONARY STENT INTERVENTION;  Surgeon: Belva Crome, MD;  Location: [facility];  Service: Cardiovascular;  Laterality: N/A;   CORONARY STENT INTERVENTION N/A 08/16/2019   Procedure: CORONARY STENT INTERVENTION;  Surgeon: Martinique, Peter M, MD;  Location: [facility];  Service: Cardiovascular;  Laterality: N/A;   DOPPLER ECHOCARDIOGRAPHY  04/12/2008   EF 60%   GASTRIC BYPASS  2009   HERNIA REPAIR     LEFT HEART CATH AND CORONARY ANGIOGRAPHY N/A 05/06/2017   Procedure: LEFT HEART CATH AND CORONARY ANGIOGRAPHY;  Surgeon: Burnell Blanks, MD;  Location: [facility];  Service: Cardiovascular;  Laterality: N/A;   LEFT HEART CATH AND CORS/GRAFTS ANGIOGRAPHY N/A 09/26/2016   Procedure: Left Heart Cath and Cors/Grafts Angiography;  Surgeon: Leonie Man, MD;  Location: [facility];  Service: Cardiovascular;  Laterality: N/A;   LEFT HEART CATH AND CORS/GRAFTS ANGIOGRAPHY N/A 06/25/2018   Procedure: LEFT HEART CATH AND CORS/GRAFTS ANGIOGRAPHY;  Surgeon: Martinique, Peter M, MD;  Location: [facility];  Service: Cardiovascular;  Laterality: N/A;   LEFT HEART CATH AND CORS/GRAFTS ANGIOGRAPHY N/A 08/12/2019   Procedure: LEFT HEART CATH AND CORS/GRAFTS ANGIOGRAPHY;  Surgeon: Belva Crome, MD;  Location: [facility];  Service: Cardiovascular;  Laterality: N/A;   LEFT HEART CATH AND CORS/GRAFTS ANGIOGRAPHY N/A 03/08/2020   Procedure: LEFT HEART CATH AND CORS/GRAFTS ANGIOGRAPHY;  Surgeon: Martinique, Peter M, MD;  Location: [place];  Service: Cardiovascular;  Laterality: N/A;   NM MYOVIEW LTD  06/03/2012   EF 71%   POSTERIOR FUSION LUMBAR SPINE  1990s   TOTAL KNEE ARTHROPLASTY Left 02/26/2021   Procedure: TOTAL KNEE ARTHROPLASTY;  Surgeon: Paralee Cancel, MD;  Location: [facility];  Service: Orthopedics;  Laterality: Left;   TUBAL LIGATION     VAGINAL HYSTERECTOMY     "partial"    Social History   Socioeconomic History   Marital status: Married    Spouse name: Not on file   Number of children: Not on file   Years of education: Not on file   Highest education level: Not on file  Occupational History   Not on file  Tobacco Use   Smoking status: Never   Smokeless tobacco: Never  Vaping Use   Vaping Use: Never used  Substance and Sexual Activity   Alcohol use: No   Drug use: No   Sexual activity: Not on file  Other Topics Concern   Not on file  Social History Narrative   Not on file   Social Determinants of Health   Financial Resource Strain: Not on file  Food Insecurity: Not on file  Transportation Needs: Not on file  Physical Activity: Not on file  Stress: Not on file  Social Connections: Not on file    Family History  Problem Relation Age of Onset   Heart disease Mother    Cancer - Lung Father     Outpatient Encounter Medications as of 04/23/2021  Medication Sig   Blood Glucose Monitoring Suppl (ACCU-CHEK GUIDE ME) w/Device KIT 1 Piece by Does not apply route as directed.   Cholecalciferol 50 MCG (2000 UT) CAPS Take 1 capsule (2,000 Units total) by mouth daily with breakfast.   glucose blood (ACCU-CHEK GUIDE) test strip Use as instructed   amLODipine (NORVASC) 2.5 MG tablet Take 1 tablet (2.5 mg total) by mouth daily.   aspirin EC 81 MG tablet Take 81 mg by mouth daily.   atorvastatin (LIPITOR) 40 MG tablet Take 1 tablet (40 mg total) by mouth daily.   cetirizine (ZYRTEC) 10 MG tablet Take 10 mg by mouth daily as needed for allergies.   cilostazol (PLETAL) 50 MG tablet Take 1 tablet (50 mg total) by mouth 2 (two) times daily. (Patient not taking: Reported on 03/04/2021)   Coenzyme Q10 (CO Q-10) 200 MG CAPS Take 200 mg by mouth daily.   cyclobenzaprine (FLEXERIL) 10 MG tablet Take 10 mg by mouth 3 (three)  times daily as needed for muscle spasms.   diclofenac sodium (VOLTAREN) 1 % GEL Apply 2 g topically in the morning and at bedtime.   dicyclomine (BENTYL) 10 MG capsule Take 10 mg by mouth every 6 (six) hours as needed for spasms.   Docusate Calcium (STOOL SOFTENER PO) Take 100 mg by mouth daily.  ferrous sulfate 325 (65 FE) MG tablet Take 325 mg by mouth daily.   Fingerstix Lancets MISC Inject into the skin.   fluocinonide cream (LIDEX) 7.41 % 1 application 3 (three) times daily.   fluticasone (FLONASE) 50 MCG/ACT nasal spray Place 1 spray into both nostrils 2 (two) times daily.   furosemide (LASIX) 40 MG tablet Take 40 mg by mouth daily as needed for fluid or edema.   gabapentin (NEURONTIN) 600 MG tablet Take 600 mg by mouth in the morning, at noon, in the evening, and at bedtime.   HYDROcodone-acetaminophen (NORCO) 7.5-325 MG tablet Take 1-2 tablets by mouth every 6 (six) hours as needed for severe pain (pain score 7-10).   Iron-Vitamin C (VITRON-C PO) Take 1 tablet by mouth daily.   isosorbide mononitrate (IMDUR) 30 MG 24 hr tablet Take 1 tablet (30 mg total) by mouth daily.   lidocaine (LIDODERM) 5 % Place 1 patch onto the skin daily as needed. Remove & Discard patch within 12 hours or as directed by MD   metoprolol tartrate (LOPRESSOR) 50 MG tablet Take 1 tablet (50 mg total) by mouth 2 (two) times daily.   Multiple Vitamin (MULTIVITAMIN WITH MINERALS) TABS tablet Take 1 tablet by mouth daily.   nitroGLYCERIN (NITROSTAT) 0.4 MG SL tablet Place 1 tablet (0.4 mg total) under the tongue every 5 (five) minutes as needed for chest pain (up to 3 doses). (Patient taking differently: Place 0.4 mg under the tongue every 5 (five) minutes as needed for chest pain.)   pantoprazole (PROTONIX) 40 MG tablet Take 40 mg by mouth daily as needed (acid reflux).    polyethylene glycol (MIRALAX / GLYCOLAX) 17 g packet Take 17 g by mouth daily as needed for mild constipation. (Patient not taking: Reported on  03/04/2021)   ranolazine (RANEXA) 1000 MG SR tablet Take 1 tablet (1,000 mg total) by mouth 2 (two) times daily.   ticagrelor (BRILINTA) 60 MG TABS tablet Take 1 tablet (60 mg total) by mouth 2 (two) times daily.   vitamin B-12 (CYANOCOBALAMIN) 1000 MCG tablet Take 1,000 mcg by mouth daily.   vitamin E 400 UNIT capsule Take 400 Units by mouth daily.   [DISCONTINUED] ONETOUCH ULTRA test strip Test BG qid   Facility-Administered Encounter Medications as of 04/23/2021  Medication   sodium chloride flush (NS) 0.9 % injection 3 mL    ALLERGIES: Allergies  Allergen Reactions   Codeine Hives and Shortness Of Breath        Other Anaphylaxis    Nuts   Ciprofloxacin Itching   Valsartan Rash   Adhesive [Tape] Itching    "clear tape" used after surgery."took all her skin off".  Paper take is ok   Sulfa Antibiotics Nausea Only    VACCINATION STATUS:  There is no immunization history on file for this patient.  Diabetes She presents for her follow-up diabetic visit. She has type 2 diabetes mellitus. Onset time: She was diagnosed at approximate age of 74 years. Her disease course has been stable. There are no hypoglycemic associated symptoms. Pertinent negatives for hypoglycemia include no confusion, headaches, pallor or seizures. Pertinent negatives for diabetes include no chest pain, no fatigue, no polydipsia, no polyphagia and no polyuria. There are no hypoglycemic complications. Symptoms are stable. Diabetic complications include heart disease. Risk factors for coronary artery disease include dyslipidemia, diabetes mellitus, hypertension, sedentary lifestyle, post-menopausal, obesity and family history. When asked about current treatments, none were reported. Her weight is fluctuating minimally. She is following a generally  unhealthy diet. When asked about meal planning, she reported none. She has not had a previous visit with a dietitian. She never participates in exercise. Her home blood glucose  trend is fluctuating minimally. Her breakfast blood glucose range is generally 130-140 mg/dl. Her overall blood glucose range is 130-140 mg/dl. (She presents with controlled glycemic profile.  Her recent A1c was 6.1%.  She is not on any antihypertensive medication at this time.  She was taken off of her Starlix last visit due to random, mild hypoglycemia.   ) An ACE inhibitor/angiotensin II receptor blocker is being taken. Eye exam is current.  Hyperlipidemia This is a chronic problem. The current episode started more than 1 year ago. The problem is controlled. Exacerbating diseases include diabetes. Pertinent negatives include no chest pain, myalgias or shortness of breath. Risk factors for coronary artery disease include dyslipidemia, diabetes mellitus, hypertension, a sedentary lifestyle and post-menopausal.  Hypertension This is a chronic problem. The problem is controlled. Pertinent negatives include no chest pain, headaches, palpitations or shortness of breath. Risk factors for coronary artery disease include dyslipidemia, diabetes mellitus, sedentary lifestyle, family history and post-menopausal state. Past treatments include ACE inhibitors. Hypertensive end-organ damage includes CAD/MI.   Review of systems: Limited as above.  Objective:    BP 112/76    Pulse 72    Ht _0  (1.626 m)    Wt 173 lb 3.2 oz (78.6 kg)    BMI 29.73 kg/m   Wt Readings from Last 3 Encounters:  04/23/21 173 lb 3.2 oz (78.6 kg)  03/04/21 174 lb (78.9 kg)  02/26/21 172 lb (78 kg)     Physical Exam- Limited  Constitutional:  Body mass index is 29.73 kg/m. , not in acute distress, normal state of mind   CMP     Component Value Date/Time   NA 137 03/04/2021 1445   NA 143 01/14/2021 0920   K 3.8 03/04/2021 1445   CL 107 03/04/2021 1445   CO2 24 03/04/2021 1445   GLUCOSE 87 03/04/2021 1445   BUN 14 03/04/2021 1445   BUN 8 01/14/2021 0920   CREATININE 0.80 03/04/2021 1445   CREATININE 0.94 10/18/2018 1000    CALCIUM 8.5 (L) 03/04/2021 1445   PROT 7.2 02/13/2021 0957   PROT 7.0 01/14/2021 0920   ALBUMIN 3.8 02/13/2021 0957   ALBUMIN 4.3 01/14/2021 0920   AST 34 02/13/2021 0957   ALT 31 02/13/2021 0957   ALKPHOS 95 02/13/2021 0957   BILITOT 0.4 02/13/2021 0957   BILITOT 0.4 01/14/2021 0920   GFRNONAA >60 03/04/2021 1445   GFRNONAA 64 10/18/2018 1000   GFRAA 82 03/05/2020 1207   GFRAA 74 10/18/2018 1000    Diabetic Labs (most recent): Lab Results  Component Value Date   HGBA1C 6.1 (H) 02/13/2021   HGBA1C 5.8 07/17/2020   HGBA1C 6.1 01/11/2020     Lipid Panel ( most recent) Lipid Panel     Component Value Date/Time   CHOL 136 01/11/2020 0000   CHOL 131 07/05/2018 1152   TRIG 61 01/11/2020 0000   HDL 83 (A) 01/11/2020 0000   HDL 83 07/05/2018 1152   CHOLHDL 1.6 07/05/2018 1152   CHOLHDL 1.8 04/15/2010 0400   VLDL 3 04/15/2010 0400   LDLCALC 41 01/11/2020 0000   LDLCALC 38 07/05/2018 1152      Lab Results  Component Value Date   TSH 1.440 01/14/2021   TSH 0.80 01/11/2020   TSH 1.16 04/27/2019   TSH 1.47 05/12/2018  TSH 1.540 01/01/2018   TSH 1.268 04/14/2010   FREET4 1.15 01/14/2021      Assessment & Plan:   1. DM type 2 causing vascular disease ([place])   - LEVETTE PAULICK has currently controlled  type 2 DM since  66 years of age.   She presents with controlled glycemic profile.  Her recent A1c was 6.1%.  She is not on any antihypertensive medication at this time.  She was taken off of her Starlix last visit due to random, mild hypoglycemia.    -her diabetes is complicated by coronary artery disease, sedentary life, and she remains at a high risk for more acute and chronic complications which include CAD, CVA, CKD, retinopathy, and neuropathy. These are all discussed in detail with her.  - I have counseled her on diet management and weight loss, by adopting a carbohydrate restricted/protein rich diet.  - she acknowledges that there is a room for improvement  in her food and drink choices. - Suggestion is made for her to avoid simple carbohydrates  from her diet including Cakes, Sweet Desserts, Ice Cream, Soda (diet and regular), Sweet Tea, Candies, Chips, Cookies, Store Bought Juices, Alcohol , Artificial Sweeteners,  Coffee Creamer, and "Sugar-free" Products, Lemonade. This will help patient to have more stable blood glucose profile and potentially avoid unintended weight gain.  The following Lifestyle Medicine recommendations according to [place]  [facility]) were discussed and and offered to patient and she  agrees to start the journey:  A. Whole Foods, Plant-Based Nutrition comprising of fruits and vegetables, plant-based proteins, whole-grain carbohydrates was discussed in detail with the patient.   A list for source of those nutrients were also provided to the patient.  Patient will use only water or unsweetened tea for hydration. B.  The need to stay away from risky substances including alcohol, smoking; obtaining 7 to 9 hours of restorative sleep, at least 150 minutes of moderate intensity exercise weekly, the importance of healthy social connections,  and stress management techniques were discussed.  - she is advised to stick to a routine mealtimes to eat 3 meals  a day and avoid unnecessary snacks ( to snack only to correct hypoglycemia).  - I have approached her with the following individualized plan to manage  her diabetes and patient agrees:   - in light of her presentation with tightly controlled glycemic profile and target A1c of 6.1%, she will not need antidiabetic medications at this time.   She is willing to continue to monitor blood glucose at fasting, and will report if fasting blood glucose readings are greater than 200 mg/dl.  She was previously treated with Starlix, if she needs intervention, she will be considered for low dose metformin. - Patient specific target  A1c;  LDL, HDL, Triglycerides, were  discussed in detail.  2) Blood Pressure /Hypertension:  -Her blood pressure is controlled to target. -   she is advised to continue her current medications including  Ramipril 5 mg p.o. daily with breakfast .  3) Lipids/Hyperlipidemia:   Review of her recent lipid panel showed controlled LDL of 41.  In light of her cardiac history,   she  is advised to continue simvastatin 20 mg p.o. daily at bedtime.   Side effects and precautions discussed with her.     4)  Weight/Diet: Her BMI is 29.7-she is a candidate for modest weight loss.   I discussed with her the fact that loss of 5 - 10% of  her  current body weight will have the most impact on her diabetes management.  CDE Consult will be initiated . Exercise, and detailed carbohydrates information provided  -  detailed on discharge instructions.  5) vitamin D deficiency: -Her 25-hydroxy vitamin D has improved to 31.1 .  She is advised to finish her current supplies of vitamin D2 50,000 units weekly and maintain with vitamin D3 1000 units daily.    6) Chronic Care/Health Maintenance:  -she  is on ACEI/ARB and Statin medications and  is encouraged to initiate and continue to follow up with Ophthalmology, Dentist,  Podiatrist at least yearly or according to recommendations, and advised to   stay away from smoking. I have recommended yearly flu vaccine and pneumonia vaccine at least every 5 years; moderate intensity exercise for up to 150 minutes weekly; and  sleep for at least 7 hours a day.  - she is  advised to maintain close follow up with Tamara Manila, MD for primary care needs, as well as her other providersfor optimal and coordinated care.   I spent 40 minutes in the care of the patient today including review of labs from [facility], Lipids, Thyroid Function, Hematology (current and previous including abstractions from other facilities); face-to-face time discussing  her blood glucose readings/logs, discussing hypoglycemia and hyperglycemia  episodes and symptoms, medications doses, her options of short and long term treatment based on the latest standards of care / guidelines;  discussion about incorporating lifestyle medicine;  and documenting the encounter.    Please refer to Patient Instructions for Blood Glucose Monitoring and Insulin/Medications Dosing Guide"  in media tab for additional information. Please  also refer to " Patient Self Inventory" in the Media  tab for reviewed elements of pertinent patient history.  Juel Burrow participated in the discussions, expressed understanding, and voiced agreement with the above plans.  All questions were answered to her satisfaction. she is encouraged to contact clinic should she have any questions or concerns prior to her return visit.    Follow up plan: - Return in about 6 months (around 10/21/2021) for A1c -[place].  Glade Lloyd, MD [facility] [address] [place], [place] [address] Phone: [phone]  Fax: [phone]    04/23/2021, 12:31 PM  This note was partially dictated with voice recognition software. Similar sounding words can be transcribed inadequately or may not  be corrected upon review.

## 2021-04-25 ENCOUNTER — Other Ambulatory Visit: Payer: Self-pay

## 2021-04-25 MED ORDER — LANCETS MISC: 1 refills | Status: DC

## 2021-04-30 ENCOUNTER — Telehealth: Payer: Self-pay

## 2021-04-30 NOTE — Telephone Encounter (Addendum)
Patient assistance application for Brilinta, approved until 03/09/2022 by Zigmund Daniel C @ [phone] 04/30/2021.

## 2021-05-27 ENCOUNTER — Other Ambulatory Visit: Payer: Self-pay

## 2021-05-27 ENCOUNTER — Encounter: Payer: Self-pay | Admitting: Physician Assistant

## 2021-05-27 ENCOUNTER — Ambulatory Visit: Payer: Medicare Other | Admitting: Physician Assistant

## 2021-05-27 VITALS — BP 130/62 | HR 78 | Ht 64.0 in | Wt 168.0 lb

## 2021-05-27 DIAGNOSIS — I10 Essential (primary) hypertension: Secondary | ICD-10-CM

## 2021-05-27 DIAGNOSIS — E785 Hyperlipidemia, unspecified: Secondary | ICD-10-CM

## 2021-05-27 DIAGNOSIS — Z01810 Encounter for preprocedural cardiovascular examination: Secondary | ICD-10-CM | POA: Diagnosis not present

## 2021-05-27 DIAGNOSIS — I25810 Atherosclerosis of coronary artery bypass graft(s) without angina pectoris: Secondary | ICD-10-CM | POA: Diagnosis not present

## 2021-05-27 NOTE — Patient Instructions (Signed)
Medication Instructions:  ?Your physician recommends that you continue on your current medications as directed. Please refer to the Current Medication list given to you today. ? ?*If you need a refill on your cardiac medications before your next appointment, please call your pharmacy* ? ?Lab Work: ?NONE ordered at this time of appointment  ? ?If you have labs (blood work) drawn today and your tests are completely normal, you will receive your results only by: ?MyChart Message (if you have MyChart) OR ?A paper copy in the mail ?If you have any lab test that is abnormal or we need to change your treatment, we will call you to review the results. ? ?Testing/Procedures: ?NONE ordered at this time of appointment  ? ?Follow-Up: ?At [facility], you and your health needs are our priority.  As part of our continuing mission to provide you with exceptional heart care, we have created designated Provider Care Teams.  These Care Teams include your primary Cardiologist (physician) and Advanced Practice Providers (APPs -  Physician Assistants and Nurse Practitioners) who all work together to provide you with the care you need, when you need it. ? ?Your next appointment:   ?6 month(s) ? ?The format for your next appointment:   ?In Person ? ?Provider:   ?Pixie Casino, MD   ? ?Other Instructions ? ? ?

## 2021-05-27 NOTE — Progress Notes (Signed)
?Cardiology Office Note:   ? ?Date:  05/29/2021  ? ?ID:  Tamara Nunez, DOB [birth year], MRN [id number] ? ?PCP:  Galen Manila, MD ?  ?[facility] Providers ?Cardiologist:  Pixie Casino, MD    ? ?Referring MD: Galen Manila, MD  ? ?Chief Complaint  ?Patient presents with  ? Follow-up  ? Shortness of Breath  ? Fatigue  ? ? ?History of Present Illness:   ? ?Tamara Nunez is a 66 y.o. female with a hx of CAD s/p CABG 2006 with LIMA to LAD, SVG to distal LAD, hypertension, hyperlipidemia, DM II, GERD, and fibromyalgia.  Cardiac catheterization in 2009 showed widely patent grafts.  Myoview in March 2014 was low risk. Patient underwent repeat cardiac catheterization in July 2018 that demonstrated 80% proximal RCA lesion that was treated with DES, she also had atretic LIMA to LAD with 100% occlusion of mid LAD, patent SVG to distal LAD, EF 55 to 60%.  Unfortunately despite intervention, she continued to have intermittent chest discomfort.  Last cardiac catheterization performed in April 2020 demonstrated a single obstructive vessel with chronic total occlusion of mid LAD that was bypassed by SVG graft.  New 90% proximal D1 that was felt to be culprit lesion, this was treated with a drug-eluting stent.  She again underwent cardiac catheterization by Dr. Martinique in June 2021 that demonstrated 75% distal RCA lesion that was treated with drug-eluting stent.   ?  ?Her chest pain initially resolved after previous PCI in June 2021, however it has recurred.  We eventually recommended relook cath on 03/08/2020 which showed patent stents, atretic LIMA to LAD, patent SVG to LAD, no new stenosis to explain her overall symptom.  Medical therapy was recommended.  Lower extremity Doppler performed in December 2021 revealed noncompressible ABI bilaterally with tibial vessel disease but no large vessel disease. It is recommended she continue dual antiplatelet therapy lifelong.  She was placed on Pletal for lower extremity  claudication symptoms.  I last saw the patient in September 2022 at which time she had intermittent chest discomfort after her Kary Kos was switched to Plavix, she wished to go back to [place] again.  I also added low-dose Imdur to her medical therapy.  She was supposed to follow-up in 6 to 8 weeks, this never happened. ? ?Patient presents today for a delayed follow-up.  In the past 6 months, she has not had any chest comfort or worsening dyspnea.  She is doing very well on the current therapy.  Her cholesterol lab work obtained in February 2023 at PCPs office showed total cholesterol 121, HDL 66, LDL 44, triglyceride 49.  Her cholesterol seems to be very well controlled.  Hemoglobin borderline low at 10.7, however stable when compared to the previous lab work.  Normal kidney function and electrolyte.  Overall, she is doing quite well from the cardiac perspective and can follow-up in 6 months.  She previously underwent left knee surgery and is due for right knee surgery in June.  Since she is stable from the cardiac perspective, she may proceed with knee surgery without further work-up.  Previous recommendation of holding aspirin for 7 days and the Brilinta for 5 days still apply in this case. ? ?Past Medical History:  ?Diagnosis Date  ? Arthritis   ? "in my back" (05/06/2017)  ? CAD (coronary artery disease)   ? CABG 2006, 09/2016 DES RCA, 04/2017 DES Circ, 06/2018 DES 1st diag  ? Chronic back pain   ? Coronary  artery disease   ? a. s/p CABG in 2006 with LIMA-LAD, SVG-dLAD. b. cath in 2009 showing patent grafts. c. 09/2016: cath with occluded LAD but patent SVG-dLAD. LIMA was atretic. pRCA with 80% stenosis --> treated with DES  ? Fibromyalgia   ? GERD (gastroesophageal reflux disease)   ? Hyperlipidemia   ? Hypertension   ? Type II diabetes mellitus ([place])   ? ? ?Past Surgical History:  ?Procedure Laterality Date  ? ABDOMINAL HERNIA REPAIR  2018  ? BACK SURGERY    ? CARDIAC CATHETERIZATION  01/13/2008  ? COLONOSCOPY     ? CORONARY ANGIOPLASTY WITH STENT PLACEMENT  09/26/2016  ? ?Prox RCA lesion, 80 %stenosed. DES   ? CORONARY ARTERY BYPASS GRAFT  12/23/2004  ? 2 vessel - LIMA to LAD (atretic), SVG to distal LAD  ? CORONARY ATHERECTOMY N/A 08/16/2019  ? Procedure: CORONARY ATHERECTOMY;  Surgeon: Martinique, Peter M, MD;  Location: [facility];  Service: Cardiovascular;  Laterality: N/A;  ? CORONARY STENT INTERVENTION N/A 09/26/2016  ? Procedure: Coronary Stent Intervention;  Surgeon: Leonie Man, MD;  Location: [facility];  Service: Cardiovascular;  Laterality: N/A;  ? CORONARY STENT INTERVENTION N/A 05/06/2017  ? Procedure: CORONARY STENT INTERVENTION;  Surgeon: Burnell Blanks, MD;  Location: [facility];  Service: Cardiovascular;  Laterality: N/A;  ? CORONARY STENT INTERVENTION N/A 06/25/2018  ? Procedure: CORONARY STENT INTERVENTION;  Surgeon: Martinique, Peter M, MD;  Location: [facility];  Service: Cardiovascular;  Laterality: N/A;  ? CORONARY STENT INTERVENTION N/A 08/12/2019  ? Procedure: CORONARY STENT INTERVENTION;  Surgeon: Belva Crome, MD;  Location: [facility];  Service: Cardiovascular;  Laterality: N/A;  ? CORONARY STENT INTERVENTION N/A 08/16/2019  ? Procedure: CORONARY STENT INTERVENTION;  Surgeon: Martinique, Peter M, MD;  Location: [facility];  Service: Cardiovascular;  Laterality: N/A;  ? DOPPLER ECHOCARDIOGRAPHY  04/12/2008  ? EF 60%  ? GASTRIC BYPASS  2009  ? HERNIA REPAIR    ? LEFT HEART CATH AND CORONARY ANGIOGRAPHY N/A 05/06/2017  ? Procedure: LEFT HEART CATH AND CORONARY ANGIOGRAPHY;  Surgeon: Burnell Blanks, MD;  Location: [facility];  Service: Cardiovascular;  Laterality: N/A;  ? LEFT HEART CATH AND CORS/GRAFTS ANGIOGRAPHY N/A 09/26/2016  ? Procedure: Left Heart Cath and Cors/Grafts Angiography;  Surgeon: Leonie Man, MD;  Location: [facility];  Service: Cardiovascular;  Laterality: N/A;  ? LEFT HEART CATH AND CORS/GRAFTS ANGIOGRAPHY N/A  06/25/2018  ? Procedure: LEFT HEART CATH AND CORS/GRAFTS ANGIOGRAPHY;  Surgeon: Martinique, Peter M, MD;  Location: [facility];  Service: Cardiovascular;  Laterality: N/A;  ? LEFT HEART CATH AND CORS/GRAFTS ANGIOGRAPHY N/A 08/12/2019  ? Procedure: LEFT HEART CATH AND CORS/GRAFTS ANGIOGRAPHY;  Surgeon: Belva Crome, MD;  Location: [place] [facility];  Service: Cardiovascular;  Laterality: N/A;  ? LEFT HEART CATH AND CORS/GRAFTS ANGIOGRAPHY N/A 03/08/2020  ? Procedure: LEFT HEART CATH AND CORS/GRAFTS ANGIOGRAPHY;  Surgeon: Martinique, Peter M, MD;  Location: [facility];  Service: Cardiovascular;  Laterality: N/A;  ? NM MYOVIEW LTD  06/03/2012  ? EF 71%  ? POSTERIOR FUSION LUMBAR SPINE  1990s  ? TOTAL KNEE ARTHROPLASTY Left 02/26/2021  ? Procedure: TOTAL KNEE ARTHROPLASTY;  Surgeon: Paralee Cancel, MD;  Location: [facility];  Service: Orthopedics;  Laterality: Left;  ? TUBAL LIGATION    ? VAGINAL HYSTERECTOMY    ? "partial"  ? ? ?Current Medications: ?  Current Meds  ?Medication Sig  ? amLODipine (NORVASC) 2.5 MG tablet Take 1 tablet (2.5 mg total) by mouth daily.  ? aspirin EC 81 MG tablet Take 81 mg by mouth daily.  ? atorvastatin (LIPITOR) 40 MG tablet Take 1 tablet (40 mg total) by mouth daily.  ? Blood Glucose Monitoring Suppl (ACCU-CHEK GUIDE ME) w/Device KIT 1 Piece by Does not apply route as directed.  ? cetirizine (ZYRTEC) 10 MG tablet Take 10 mg by mouth daily as needed for allergies.  ? Cholecalciferol 50 MCG (2000 UT) CAPS Take 1 capsule (2,000 Units total) by mouth daily with breakfast.  ? cilostazol (PLETAL) 50 MG tablet Take 1 tablet (50 mg total) by mouth 2 (two) times daily.  ? Coenzyme Q10 (CO Q-10) 200 MG CAPS Take 200 mg by mouth daily.  ? cyclobenzaprine (FLEXERIL) 10 MG tablet Take 10 mg by mouth 3 (three) times daily as needed for muscle spasms.  ? diclofenac sodium (VOLTAREN) 1 % GEL Apply 2 g topically in the morning and at bedtime.  ? dicyclomine (BENTYL) 10 MG capsule Take 10 mg by mouth every  6 (six) hours as needed for spasms.  ? Docusate Calcium (STOOL SOFTENER PO) Take 100 mg by mouth daily.  ? ferrous sulfate 325 (65 FE) MG tablet Take 325 mg by mouth daily.  ? Fingerstix Lancets MISC [place]

## 2021-06-18 ENCOUNTER — Other Ambulatory Visit: Payer: Self-pay | Admitting: "Endocrinology

## 2021-08-01 NOTE — Progress Notes (Addendum)
COVID Vaccine Completed:yes x5  Date of COVID positive in last 90 days: no  PCP - Early Chars, MD Cardiologist - Zoila Shutter, MD  Cardiac clearance by [facility] 05/27/21 Epic Medical clearance 07/15/21 by Early Chars 07/15/21 Epic  Chest x-ray - n/a EKG - 12/04/20 Epic Stress Test - 01/15/18 Epic ECHO - 2015 Cardiac Cath - 03/08/20 Epic Pacemaker/ICD device last checked: n/a Spinal Cord Stimulator: n/a  Bowel Prep - no  Sleep Study - n/a CPAP -   Fasting Blood Sugar - 90-100 Checks Blood Sugar 2 times a day  Blood Thinner Instructions:  Brilinta, hold 5 days Aspirin Instructions:ASA 81, hold  days Last Dose: 08/07/21 2000  Activity level: Can go up a flight of stairs and perform activities of daily living without stopping and without symptoms of chest pain or shortness of breath.     Anesthesia review: CABG x2, DM2, HTN, CAD  Patient denies shortness of breath, fever, cough and chest pain at PAT appointment   Patient verbalized understanding of instructions that were given to them at the PAT appointment. Patient was also instructed that they will need to review over the PAT instructions again at home before surgery.

## 2021-08-01 NOTE — Patient Instructions (Addendum)
DUE TO COVID-19 ONLY TWO VISITORS  (aged 67 and older)  ARE ALLOWED TO COME WITH YOU AND STAY IN THE WAITING ROOM ONLY DURING PRE OP AND PROCEDURE.   **NO VISITORS ARE ALLOWED IN THE SHORT STAY AREA OR RECOVERY ROOM!!**  IF YOU WILL BE ADMITTED INTO THE HOSPITAL YOU ARE ALLOWED ONLY FOUR SUPPORT PEOPLE DURING VISITATION HOURS ONLY (7 AM -8PM)   The support person(s) must pass our screening, gel in and out, and wear a mask at all times, including in the patient's room. Patients must also wear a mask when staff or their support person are in the room. Visitors GUEST BADGE MUST BE WORN VISIBLY  One adult visitor may remain with you overnight and MUST be in the room by 8 P.M.     Your procedure is scheduled on: 08/13/21   Report to [facility] Main Entrance    Report to admitting at 5:15 AM   Call this number if you have problems the morning of surgery [phone]   Do not eat food :After Midnight.   After Midnight you may have the following liquids until 4:15 AM DAY OF SURGERY  Water Black Coffee (sugar ok, NO MILK/CREAM OR CREAMERS)  Tea (sugar ok, NO MILK/CREAM OR CREAMERS) regular and decaf                             Plain Jell-O (NO RED)                                           Fruit ices (not with fruit pulp, NO RED)                                     Popsicles (NO RED)                                                                  Juice: apple, WHITE grape, WHITE cranberry Sports drinks like Gatorade (NO RED) Clear broth(vegetable,chicken,beef)   The day of surgery:  Drink ONE (1) Pre-Surgery G2 at 4:15 AM the morning of surgery. Drink in one sitting. Do not sip.  This drink was given to you during your hospital  pre-op appointment visit. Nothing else to drink after completing the  Pre-Surgery G2.          If you have questions, please contact your surgeon's office.   FOLLOW BOWEL PREP AND ANY ADDITIONAL PRE OP INSTRUCTIONS YOU RECEIVED FROM YOUR SURGEON'S  OFFICE!!!     Oral Hygiene is also important to reduce your risk of infection.                                    Remember - BRUSH YOUR TEETH THE MORNING OF SURGERY WITH YOUR REGULAR TOOTHPASTE   Take these medicines the morning of surgery with A SIP OF WATER: Amlodipine, Atorvastatin, Zyrtec, Gabapentin, Isosorbide, Metoprolol, Pantoprazole, Ranexa   DO NOT TAKE ANY ORAL DIABETIC MEDICATIONS  DAY OF YOUR SURGERY  How to Manage Your Diabetes Before and After Surgery  Why is it important to control my blood sugar before and after surgery? Improving blood sugar levels before and after surgery helps healing and can limit problems. A way of improving blood sugar control is eating a healthy diet by:  Eating less sugar and carbohydrates  Increasing activity/exercise  Talking with your doctor about reaching your blood sugar goals High blood sugars (greater than 180 mg/dL) can raise your risk of infections and slow your recovery, so you will need to focus on controlling your diabetes during the weeks before surgery. Make sure that the doctor who takes care of your diabetes knows about your planned surgery including the date and location.  How do I manage my blood sugar before surgery? Check your blood sugar at least 4 times a day, starting 2 days before surgery, to make sure that the level is not too high or low. Check your blood sugar the morning of your surgery when you wake up and every 2 hours until you get to the Short Stay unit. If your blood sugar is less than 70 mg/dL, you will need to treat for low blood sugar: Do not take insulin. Treat a low blood sugar (less than 70 mg/dL) with  cup of clear juice (cranberry or apple), 4 glucose tablets, OR glucose gel. Recheck blood sugar in 15 minutes after treatment (to make sure it is greater than 70 mg/dL). If your blood sugar is not greater than 70 mg/dL on recheck, call [phone] for further instructions. Report your blood sugar to the  short stay nurse when you get to Short Stay.  If you are admitted to the hospital after surgery: Your blood sugar will be checked by the staff and you will probably be given insulin after surgery (instead of oral diabetes medicines) to make sure you have good blood sugar levels. The goal for blood sugar control after surgery is 80-180 mg/dL.  Reviewed and Endorsed by [facility] Patient Education Committee, August 2015                               You may not have any metal on your body including hair pins, jewelry, and body piercing             Do not wear make-up, lotions, powders, perfumes, or deodorant  Do not wear nail polish including gel and S&S, artificial/acrylic nails, or any other type of covering on natural nails including finger and toenails. If you have artificial nails, gel coating, etc. that needs to be removed by a nail salon please have this removed prior to surgery or surgery may need to be canceled/ delayed if the surgeon/ anesthesia feels like they are unable to be safely monitored.   Do not shave  48 hours prior to surgery.    Do not bring valuables to the hospital. [facility] IS NOT             RESPONSIBLE   FOR VALUABLES.   Contacts, dentures or bridgework may not be worn into surgery.   Bring small overnight bag day of surgery.               Please read over the following fact sheets you were given: IF YOU HAVE QUESTIONS ABOUT YOUR PRE-OP INSTRUCTIONS PLEASE CALL [phone]- [facility] - Preparing for Surgery Before surgery,  you can play an important role.  Because skin is not sterile, your skin needs to be as free of germs as possible.  You can reduce the number of germs on your skin by washing with CHG (chlorahexidine gluconate) soap before surgery.  CHG is an antiseptic cleaner which kills germs and bonds with the skin to continue killing germs even after washing. Please DO NOT use if you have an allergy to CHG or antibacterial soaps.  If your  skin becomes reddened/irritated stop using the CHG and inform your nurse when you arrive at Short Stay. Do not shave (including legs and underarms) for at least 48 hours prior to the first CHG shower.  You may shave your face/neck.  Please follow these instructions carefully:  1.  Shower with CHG Soap the night before surgery and the  morning of surgery.  2.  If you choose to wash your hair, wash your hair first as usual with your normal  shampoo.  3.  After you shampoo, rinse your hair and body thoroughly to remove the shampoo.                             4.  Use CHG as you would any other liquid soap.  You can apply chg directly to the skin and wash.  Gently with a scrungie or clean washcloth.  5.  Apply the CHG Soap to your body ONLY FROM THE NECK DOWN.   Do   not use on face/ open                           Wound or open sores. Avoid contact with eyes, ears mouth and   genitals (private parts).                       Wash face,  Genitals (private parts) with your normal soap.             6.  Wash thoroughly, paying special attention to the area where your    surgery  will be performed.  7.  Thoroughly rinse your body with warm water from the neck down.  8.  DO NOT shower/wash with your normal soap after using and rinsing off the CHG Soap.                9.  Pat yourself dry with a clean towel.            10.  Wear clean pajamas.            11.  Place clean sheets on your bed the night of your first shower and do not  sleep with pets. Day of Surgery : Do not apply any lotions/deodorants the morning of surgery.  Please wear clean clothes to the hospital/surgery center.  FAILURE TO FOLLOW THESE INSTRUCTIONS MAY RESULT IN THE CANCELLATION OF YOUR SURGERY  PATIENT SIGNATURE_________________________________  NURSE SIGNATURE__________________________________  ________________________________________________________________________   Rogelia Mire  An incentive spirometer is a tool  that can help keep your lungs clear and active. This tool measures how well you are filling your lungs with each breath. Taking long deep breaths may help reverse or decrease the chance of developing breathing (pulmonary) problems (especially infection) following: A long period of time when you are unable to move or be active. BEFORE THE PROCEDURE  If the spirometer includes an indicator to show your  best effort, your nurse or respiratory therapist will set it to a desired goal. If possible, sit up straight or lean slightly forward. Try not to slouch. Hold the incentive spirometer in an upright position. INSTRUCTIONS FOR USE  Sit on the edge of your bed if possible, or sit up as far as you can in bed or on a chair. Hold the incentive spirometer in an upright position. Breathe out normally. Place the mouthpiece in your mouth and seal your lips tightly around it. Breathe in slowly and as deeply as possible, raising the piston or the ball toward the top of the column. Hold your breath for 3-5 seconds or for as long as possible. Allow the piston or ball to fall to the bottom of the column. Remove the mouthpiece from your mouth and breathe out normally. Rest for a few seconds and repeat Steps 1 through 7 at least 10 times every 1-2 hours when you are awake. Take your time and take a few normal breaths between deep breaths. The spirometer may include an indicator to show your best effort. Use the indicator as a goal to work toward during each repetition. After each set of 10 deep breaths, practice coughing to be sure your lungs are clear. If you have an incision (the cut made at the time of surgery), support your incision when coughing by placing a pillow or rolled up towels firmly against it. Once you are able to get out of bed, walk around indoors and cough well. You may stop using the incentive spirometer when instructed by your caregiver.  RISKS AND COMPLICATIONS Take your time so you do not get  dizzy or light-headed. If you are in pain, you may need to take or ask for pain medication before doing incentive spirometry. It is harder to take a deep breath if you are having pain. AFTER USE Rest and breathe slowly and easily. It can be helpful to keep track of a log of your progress. Your caregiver can provide you with a simple table to help with this. If you are using the spirometer at home, follow these instructions: SEEK MEDICAL CARE IF:  You are having difficultly using the spirometer. You have trouble using the spirometer as often as instructed. Your pain medication is not giving enough relief while using the spirometer. You develop fever of 100.5 F (38.1 C) or higher. SEEK IMMEDIATE MEDICAL CARE IF:  You cough up bloody sputum that had not been present before. You develop fever of 102 F (38.9 C) or greater. You develop worsening pain at or near the incision site. MAKE SURE YOU:  Understand these instructions. Will watch your condition. Will get help right away if you are not doing well or get worse. Document Released: 07/07/2006 Document Revised: 05/19/2011 Document Reviewed: 09/07/2006 ExitCare Patient Information 2014 ExitCare, [place].   ________________________________________________________________________  WHAT IS A BLOOD TRANSFUSION? Blood Transfusion Information  A transfusion is the replacement of blood or some of its parts. Blood is made up of multiple cells which provide different functions. Red blood cells carry oxygen and are used for blood loss replacement. White blood cells fight against infection. Platelets control bleeding. Plasma helps clot blood. Other blood products are available for specialized needs, such as hemophilia or other clotting disorders. BEFORE THE TRANSFUSION  Who gives blood for transfusions?  Healthy volunteers who are fully evaluated to make sure their blood is safe. This is blood bank blood. Transfusion therapy is the safest it has  ever been in the  practice of medicine. Before blood is taken from a donor, a complete history is taken to make sure that person has no history of diseases nor engages in risky social behavior (examples are intravenous drug use or sexual activity with multiple partners). The donor's travel history is screened to minimize risk of transmitting infections, such as malaria. The donated blood is tested for signs of infectious diseases, such as HIV and hepatitis. The blood is then tested to be sure it is compatible with you in order to minimize the chance of a transfusion reaction. If you or a relative donates blood, this is often done in anticipation of surgery and is not appropriate for emergency situations. It takes many days to process the donated blood. RISKS AND COMPLICATIONS Although transfusion therapy is very safe and saves many lives, the main dangers of transfusion include:  Getting an infectious disease. Developing a transfusion reaction. This is an allergic reaction to something in the blood you were given. Every precaution is taken to prevent this. The decision to have a blood transfusion has been considered carefully by your caregiver before blood is given. Blood is not given unless the benefits outweigh the risks. AFTER THE TRANSFUSION Right after receiving a blood transfusion, you will usually feel much better and more energetic. This is especially true if your red blood cells have gotten low (anemic). The transfusion raises the level of the red blood cells which carry oxygen, and this usually causes an energy increase. The nurse administering the transfusion will monitor you carefully for complications. HOME CARE INSTRUCTIONS  No special instructions are needed after a transfusion. You may find your energy is better. Speak with your caregiver about any limitations on activity for underlying diseases you may have. SEEK MEDICAL CARE IF:  Your condition is not improving after your  transfusion. You develop redness or irritation at the intravenous (IV) site. SEEK IMMEDIATE MEDICAL CARE IF:  Any of the following symptoms occur over the next 12 hours: Shaking chills. You have a temperature by mouth above 102 F (38.9 C), not controlled by medicine. Chest, back, or muscle pain. People around you feel you are not acting correctly or are confused. Shortness of breath or difficulty breathing. Dizziness and fainting. You get a rash or develop hives. You have a decrease in urine output. Your urine turns a dark color or changes to pink, red, or brown. Any of the following symptoms occur over the next 10 days: You have a temperature by mouth above 102 F (38.9 C), not controlled by medicine. Shortness of breath. Weakness after normal activity. The white part of the eye turns yellow (jaundice). You have a decrease in the amount of urine or are urinating less often. Your urine turns a dark color or changes to pink, red, or brown. Document Released: 02/22/2000 Document Revised: 05/19/2011 Document Reviewed: 10/11/2007 [facility] Patient Information 2014 [place], [place].  _______________________________________________________________________

## 2021-08-02 ENCOUNTER — Encounter (HOSPITAL_COMMUNITY): Payer: Self-pay

## 2021-08-02 ENCOUNTER — Encounter (HOSPITAL_COMMUNITY)
Admission: RE | Admit: 2021-08-02 | Discharge: 2021-08-02 | Disposition: A | Discharge status: Home / Self Care | Payer: Medicare Other | Source: Ambulatory Visit | Attending: Orthopedic Surgery | Admitting: Orthopedic Surgery

## 2021-08-02 VITALS — BP 127/72 | HR 67 | Temp 98.6°F | Resp 12 | Ht 64.0 in | Wt 168.0 lb

## 2021-08-02 DIAGNOSIS — Z951 Presence of aortocoronary bypass graft: Secondary | ICD-10-CM | POA: Diagnosis not present

## 2021-08-02 DIAGNOSIS — Z01812 Encounter for preprocedural laboratory examination: Secondary | ICD-10-CM | POA: Diagnosis present

## 2021-08-02 DIAGNOSIS — I10 Essential (primary) hypertension: Secondary | ICD-10-CM | POA: Diagnosis not present

## 2021-08-02 DIAGNOSIS — Z01818 Encounter for other preprocedural examination: Secondary | ICD-10-CM

## 2021-08-02 DIAGNOSIS — E119 Type 2 diabetes mellitus without complications: Secondary | ICD-10-CM | POA: Diagnosis not present

## 2021-08-02 DIAGNOSIS — M1711 Unilateral primary osteoarthritis, right knee: Secondary | ICD-10-CM | POA: Diagnosis not present

## 2021-08-02 DIAGNOSIS — I2510 Atherosclerotic heart disease of native coronary artery without angina pectoris: Secondary | ICD-10-CM | POA: Diagnosis not present

## 2021-08-02 LAB — SURGICAL PCR SCREEN
MRSA, PCR: NEGATIVE
Staphylococcus aureus: NEGATIVE

## 2021-08-02 LAB — TYPE AND SCREEN
ABO/RH(D): O POS
Antibody Screen: NEGATIVE

## 2021-08-02 LAB — GLUCOSE, CAPILLARY: Glucose-Capillary: 110 mg/dL — ABNORMAL HIGH (ref 70–99)

## 2021-08-03 ENCOUNTER — Other Ambulatory Visit: Payer: Self-pay | Admitting: "Endocrinology

## 2021-08-06 ENCOUNTER — Other Ambulatory Visit (HOSPITAL_COMMUNITY): Payer: Medicare Other

## 2021-08-06 NOTE — Anesthesia Preprocedure Evaluation (Addendum)
Anesthesia Evaluation  Patient identified by MRN, date of birth, ID band Patient awake    Reviewed: Allergy & Precautions, NPO status , Patient's Chart, lab work & pertinent test results  Airway Mallampati: II  TM Distance: >3 FB Neck ROM: Full    Dental no notable dental hx.    Pulmonary neg pulmonary ROS,    Pulmonary exam normal        Cardiovascular hypertension, Pt. on medications and Pt. on home beta blockers + angina + CAD, + Past MI, + CABG (2006), + Peripheral Vascular Disease and + DOE   Rhythm:Regular Rate:Normal     Neuro/Psych negative neurological ROS  negative psych ROS   GI/Hepatic Neg liver ROS, GERD  Medicated,  Endo/Other  diabetes, Type 2  Renal/GU negative Renal ROS     Musculoskeletal  (+) Arthritis , Fibromyalgia -, narcotic dependent  Abdominal Normal abdominal exam  (+)   Peds  Hematology  (+) Blood dyscrasia, anemia ,   Anesthesia Other Findings   Reproductive/Obstetrics                           Anesthesia Physical Anesthesia Plan  ASA: 3  Anesthesia Plan: MAC, Regional and Spinal   Post-op Pain Management: Regional block*   Induction: Intravenous  PONV Risk Score and Plan: 2 and Ondansetron, Dexamethasone, Propofol infusion, Midazolam and Treatment may vary due to age or medical condition  Airway Management Planned: Simple Face Mask, Natural Airway and Nasal Cannula  Additional Equipment: None  Intra-op Plan:   Post-operative Plan:   Informed Consent: I have reviewed the patients History and Physical, chart, labs and discussed the procedure including the risks, benefits and alternatives for the proposed anesthesia with the patient or authorized representative who has indicated his/her understanding and acceptance.     Dental advisory given  Plan Discussed with: CRNA  Anesthesia Plan Comments: (See PAT note 08/02/2021  Lab Results       Component                Value               Date                      WBC                      9.2                 03/04/2021                HGB                      8.2 (L)             03/04/2021                HCT                      26.6 (L)            03/04/2021                MCV                      85.0                03/04/2021  PLT                      374                 03/04/2021           Lab Results      Component                Value               Date                      NA                       137                 03/04/2021                K                        3.8                 03/04/2021                CO2                      24                  03/04/2021                GLUCOSE                  87                  03/04/2021                BUN                      14                  03/04/2021                CREATININE               0.80                03/04/2021                CALCIUM                  8.5 (L)             03/04/2021                EGFR                     77                  01/14/2021                GFRNONAA                 >60                 03/04/2021          )      Anesthesia Quick Evaluation

## 2021-08-06 NOTE — Progress Notes (Signed)
Anesthesia Chart Review   Case: [id number] Date/Time: 08/13/21 0700   Procedure: TOTAL KNEE ARTHROPLASTY (Right: Knee)   Anesthesia type: Spinal   Pre-op diagnosis: Right knee osteoarthritis   Location: [facility] ROOM 09 / [facility]   Surgeons: Paralee Cancel, MD       DISCUSSION:66 y.o. never smoker with h/o HTN, DM II, CAD (CABG 2006 with LIMA to LAD, SVG to distal LAD), right knee OA scheduled for above procedure 08/13/2021 with Dr. Paralee Cancel.   Pt last seen by cardiology 05/27/2021. Per OV note, "Preoperative clearance: Pending upcoming knee surgery in June.  Given lack of chest discomfort and the stability of coronary artery disease on the last cath in December 2021, she may proceed without further work-up.  She will need to hold aspirin for 7 days and Brilinta for 5 days prior to the procedure and restart both as soon as possible afterward at the surgeon's discretion."  Anticipate pt can proceed with planned procedure barring acute status change.   VS: BP 127/72   Pulse 67   Temp 37 C (Oral)   Resp 12   Ht _0  (1.626 m)   Wt 76.2 kg   SpO2 100%   BMI 28.84 kg/m   PROVIDERS: Galen Manila, MD is PCP   Cardiologist - Lyman Bishop, MD LABS: Labs reviewed: Acceptable for surgery. (all labs ordered are listed, but only abnormal results are displayed)  Labs Reviewed  GLUCOSE, CAPILLARY - Abnormal; Notable for the following components:      Result Value   Glucose-Capillary 110 (*)    All other components within normal limits  SURGICAL PCR SCREEN  TYPE AND SCREEN     IMAGES:   EKG: 12/04/2020 Rate 64 bpm  Sinus rhythm with marked sinus arrhythmia RBBB  CV: Cardiac Cath 03/08/2020 Non-stenotic Ost 1st Diag-2 lesion was previously treated. Non-stenotic Ost 1st Diag-1 lesion was previously treated. Prox LAD to Mid LAD lesion is 100% stenosed. Prox LAD-2 lesion is 100% stenosed. Non-stenotic Prox LAD-1 lesion was previously treated. Ost 2nd Mrg lesion is 70%  stenosed. Prox Cx lesion is 40% stenosed. Mid Cx lesion is 20% stenosed. Non-stenotic Ost Cx lesion was previously treated. Prox RCA lesion is 10% stenosed. Prox RCA to Mid RCA lesion is 30% stenosed. LIMA. Origin to Mid Graft lesion is 99% stenosed. SVG and is normal in caliber. Mid RCA to Dist RCA lesion is 35% stenosed. The left ventricular systolic function is normal. LV end diastolic pressure is normal. The left ventricular ejection fraction is 55-65% by visual estimate.   1. CAD with continued patency of prior stents. No new stenoses to explain her recent symptoms. 2. Patent SVG to LAD 3. Atretic LIMA to the LAD which is supplied by the SVG 4. Normal LV function 5. Normal LVEDP   Plan: continue medical therapy.  Stress Test 01/15/2018 Nuclear stress EF: 69%. There was no ST segment deviation noted during stress. The study is normal. This is a low risk study. The left ventricular ejection fraction is hyperdynamic (>65%).   Normal pharmacologic nuclear stress test with no evidence for prior infarct or ischemia. Normal LVEF. Past Medical History:  Diagnosis Date   Arthritis    "in my back" (05/06/2017)   CAD (coronary artery disease)    CABG 2006, 09/2016 DES RCA, 04/2017 DES Circ, 06/2018 DES 1st diag   Chronic back pain    Coronary artery disease    a. s/p CABG in 2006 with LIMA-LAD, SVG-dLAD. b. cath in  2009 showing patent grafts. c. 09/2016: cath with occluded LAD but patent SVG-dLAD. LIMA was atretic. pRCA with 80% stenosis --> treated with DES   Fibromyalgia    GERD (gastroesophageal reflux disease)    Hyperlipidemia    Hypertension    Type II diabetes mellitus ([place])     Past Surgical History:  Procedure Laterality Date   ABDOMINAL HERNIA REPAIR  2018   BACK SURGERY     CARDIAC CATHETERIZATION  01/13/2008   COLONOSCOPY     CORONARY ANGIOPLASTY WITH STENT PLACEMENT  09/26/2016   Prox RCA lesion, 80 %stenosed. DES    CORONARY ARTERY BYPASS GRAFT  12/23/2004    2 vessel - LIMA to LAD (atretic), SVG to distal LAD   CORONARY ATHERECTOMY N/A 08/16/2019   Procedure: CORONARY ATHERECTOMY;  Surgeon: Martinique, Peter M, MD;  Location: [place] [facility];  Service: Cardiovascular;  Laterality: N/A;   CORONARY STENT INTERVENTION N/A 09/26/2016   Procedure: Coronary Stent Intervention;  Surgeon: Leonie Man, MD;  Location: [place] [facility];  Service: Cardiovascular;  Laterality: N/A;   CORONARY STENT INTERVENTION N/A 05/06/2017   Procedure: CORONARY STENT INTERVENTION;  Surgeon: Burnell Blanks, MD;  Location: [place] [facility];  Service: Cardiovascular;  Laterality: N/A;   CORONARY STENT INTERVENTION N/A 06/25/2018   Procedure: CORONARY STENT INTERVENTION;  Surgeon: Martinique, Peter M, MD;  Location: [place] [facility];  Service: Cardiovascular;  Laterality: N/A;   CORONARY STENT INTERVENTION N/A 08/12/2019   Procedure: CORONARY STENT INTERVENTION;  Surgeon: Belva Crome, MD;  Location: [place] [facility];  Service: Cardiovascular;  Laterality: N/A;   CORONARY STENT INTERVENTION N/A 08/16/2019   Procedure: CORONARY STENT INTERVENTION;  Surgeon: Martinique, Peter M, MD;  Location: [place] [facility];  Service: Cardiovascular;  Laterality: N/A;   DOPPLER ECHOCARDIOGRAPHY  04/12/2008   EF 60%   GASTRIC BYPASS  2009   HERNIA REPAIR     LEFT HEART CATH AND CORONARY ANGIOGRAPHY N/A 05/06/2017   Procedure: LEFT HEART CATH AND CORONARY ANGIOGRAPHY;  Surgeon: Burnell Blanks, MD;  Location: [place] [facility];  Service: Cardiovascular;  Laterality: N/A;   LEFT HEART CATH AND CORS/GRAFTS ANGIOGRAPHY N/A 09/26/2016   Procedure: Left Heart Cath and Cors/Grafts Angiography;  Surgeon: Leonie Man, MD;  Location: [place] [facility];  Service: Cardiovascular;  Laterality: N/A;   LEFT HEART CATH AND CORS/GRAFTS ANGIOGRAPHY N/A 06/25/2018   Procedure: LEFT HEART CATH AND CORS/GRAFTS ANGIOGRAPHY;  Surgeon: Martinique, Peter M, MD;  Location: [place] [facility];  Service:  Cardiovascular;  Laterality: N/A;   LEFT HEART CATH AND CORS/GRAFTS ANGIOGRAPHY N/A 08/12/2019   Procedure: LEFT HEART CATH AND CORS/GRAFTS ANGIOGRAPHY;  Surgeon: Belva Crome, MD;  Location: [place] [facility];  Service: Cardiovascular;  Laterality: N/A;   LEFT HEART CATH AND CORS/GRAFTS ANGIOGRAPHY N/A 03/08/2020   Procedure: LEFT HEART CATH AND CORS/GRAFTS ANGIOGRAPHY;  Surgeon: Martinique, Peter M, MD;  Location: [place] [facility];  Service: Cardiovascular;  Laterality: N/A;   NM MYOVIEW LTD  06/03/2012   EF 71%   POSTERIOR FUSION LUMBAR SPINE  1990s   TOTAL KNEE ARTHROPLASTY Left 02/26/2021   Procedure: TOTAL KNEE ARTHROPLASTY;  Surgeon: Paralee Cancel, MD;  [facility];  Service: Orthopedics;  Laterality: Left;   TUBAL LIGATION     VAGINAL HYSTERECTOMY     "partial"    MEDICATIONS:  Acetaminophen (TYLENOL ARTHRITIS PAIN PO)   amLODipine (NORVASC) 2.5 MG tablet   aspirin EC  81 MG tablet   atorvastatin (LIPITOR) 40 MG tablet   cetirizine (ZYRTEC) 10 MG tablet   Cholecalciferol 50 MCG (2000 UT) CAPS   Coenzyme Q10 (CO Q-10) 200 MG CAPS   Cyanocobalamin (VITAMIN B-12 PO)   cyclobenzaprine (FLEXERIL) 10 MG tablet   diclofenac (VOLTAREN) 75 MG EC tablet   diclofenac sodium (VOLTAREN) 1 % GEL   Docusate Calcium (STOOL SOFTENER PO)   EPINEPHrine 0.3 mg/0.3 mL IJ SOAJ injection   fluticasone (FLONASE) 50 MCG/ACT nasal spray   furosemide (LASIX) 40 MG tablet   gabapentin (NEURONTIN) 600 MG tablet   Iron-Vitamin C (VITRON-C PO)   isosorbide mononitrate (IMDUR) 30 MG 24 hr tablet   lidocaine (LIDODERM) 5 %   metoprolol tartrate (LOPRESSOR) 50 MG tablet   Multiple Vitamin (MULTIVITAMIN WITH MINERALS) TABS tablet   nitroGLYCERIN (NITROSTAT) 0.4 MG SL tablet   ranolazine (RANEXA) 1000 MG SR tablet   ticagrelor (BRILINTA) 60 MG TABS tablet   tiZANidine (ZANAFLEX) 2 MG tablet   vitamin E 400 UNIT capsule   Blood Glucose Monitoring Suppl (ACCU-CHEK GUIDE ME) w/Device KIT   cilostazol  (PLETAL) 50 MG tablet   dicyclomine (BENTYL) 10 MG capsule   ferrous sulfate 325 (65 FE) MG tablet   Fingerstix Lancets MISC   fluocinonide cream (LIDEX) 0.05 %   glucose blood (ACCU-CHEK GUIDE) test strip   HYDROcodone-acetaminophen (NORCO) 7.5-325 MG tablet   Lancets MISC   pantoprazole (PROTONIX) 40 MG tablet   polyethylene glycol (MIRALAX / GLYCOLAX) 17 g packet    sodium chloride flush (NS) 0.9 % injection 3 mL    Sarena Jezek Ward, PA-C [facility] [phone]

## 2021-08-12 ENCOUNTER — Encounter (HOSPITAL_COMMUNITY): Payer: Self-pay | Admitting: Orthopedic Surgery

## 2021-08-12 NOTE — H&P (Signed)
TOTAL KNEE ADMISSION H&P  Patient is being admitted for right total knee arthroplasty.  Subjective:  Chief Complaint:right knee pain.  HPI: Tamara Nunez, 66 y.o. female, has a history of pain and functional disability in the right knee due to arthritis and has failed non-surgical conservative treatments for greater than 12 weeks to includeNSAID's and/or analgesics, corticosteriod injections, and activity modification.  Onset of symptoms was gradual, starting 2 years ago with gradually worsening course since that time. The patient noted no past surgery on the right knee(s).  Patient currently rates pain in the right knee(s) at 8 out of 10 with activity. Patient has worsening of pain with activity and weight bearing, pain that interferes with activities of daily living, and pain with passive range of motion.  Patient has evidence of joint space narrowing by imaging studies. There is no active infection.  Patient Active Problem List   Diagnosis Date Noted   S/P total knee arthroplasty, left 02/26/2021   Peripheral arterial disease ([place]) 02/24/2020   Chronic anemia 08/17/2019   Type 4a myocardial infarction ([place]) 08/17/2019   CAD (coronary artery disease) 08/12/2019   Degenerative lumbar spinal stenosis 01/28/2019   Vitamin D deficiency 11/01/2018   Unstable angina ([place]) 06/25/2018   Bilateral lower extremity edema 11/02/2017   CAD S/P percutaneous coronary angioplasty  05/07/2017   Unstable angina pectoris [facility])    Preoperative cardiovascular examination 03/11/2017   Essential hypertension, benign    Mixed hyperlipidemia    GERD (gastroesophageal reflux disease)    Diabetes mellitus without complication ([place])    Coronary artery disease    Coronary artery disease involving coronary bypass graft of native heart with unstable angina pectoris ([place]) 10/27/2016   Other fatigue 10/27/2016   DOE (dyspnea on exertion) 10/27/2016   Progressive angina ([facility])    Atherosclerosis of native coronary  artery of native heart without angina pectoris 11/29/2012   S/P CABG x 2 11/29/2012   Stable angina ([place]) 11/29/2012   HTN (hypertension) 11/29/2012   Dyslipidemia 11/29/2012   DM type 2 causing vascular disease ([place]) 11/29/2012   Past Medical History:  Diagnosis Date   Arthritis    "in my back" (05/06/2017)   CAD (coronary artery disease)    CABG 2006, 09/2016 DES RCA, 04/2017 DES Circ, 06/2018 DES 1st diag   Chronic back pain    Coronary artery disease    a. s/p CABG in 2006 with LIMA-LAD, SVG-dLAD. b. cath in 2009 showing patent grafts. c. 09/2016: cath with occluded LAD but patent SVG-dLAD. LIMA was atretic. pRCA with 80% stenosis --> treated with DES   Fibromyalgia    GERD (gastroesophageal reflux disease)    Hyperlipidemia    Hypertension    Type II diabetes mellitus ([place])     Past Surgical History:  Procedure Laterality Date   ABDOMINAL HERNIA REPAIR  2018   BACK SURGERY     CARDIAC CATHETERIZATION  01/13/2008   COLONOSCOPY     CORONARY ANGIOPLASTY WITH STENT PLACEMENT  09/26/2016   Prox RCA lesion, 80 %stenosed. DES    CORONARY ARTERY BYPASS GRAFT  12/23/2004   2 vessel - LIMA to LAD (atretic), SVG to distal LAD   CORONARY ATHERECTOMY N/A 08/16/2019   Procedure: CORONARY ATHERECTOMY;  Surgeon: Martinique, Peter M, MD;  Location: [place] [facility];  Service: Cardiovascular;  Laterality: N/A;   CORONARY STENT INTERVENTION N/A 09/26/2016   Procedure: Coronary Stent Intervention;  Surgeon: Leonie Man, MD;  Location: [place] [facility];  Service:  Cardiovascular;  Laterality: N/A;   CORONARY STENT INTERVENTION N/A 05/06/2017   Procedure: CORONARY STENT INTERVENTION;  Surgeon: Burnell Blanks, MD;  Location: [place] [facility];  Service: Cardiovascular;  Laterality: N/A;   CORONARY STENT INTERVENTION N/A 06/25/2018   Procedure: CORONARY STENT INTERVENTION;  Surgeon: Martinique, Peter M, MD;  Location: [place] [facility];  Service: Cardiovascular;  Laterality: N/A;   CORONARY  STENT INTERVENTION N/A 08/12/2019   Procedure: CORONARY STENT INTERVENTION;  Surgeon: Belva Crome, MD;  Location: [place] [facility];  Service: Cardiovascular;  Laterality: N/A;   CORONARY STENT INTERVENTION N/A 08/16/2019   Procedure: CORONARY STENT INTERVENTION;  Surgeon: Martinique, Peter M, MD;  Location: [place] [facility];  Service: Cardiovascular;  Laterality: N/A;   DOPPLER ECHOCARDIOGRAPHY  04/12/2008   EF 60%   GASTRIC BYPASS  2009   HERNIA REPAIR     LEFT HEART CATH AND CORONARY ANGIOGRAPHY N/A 05/06/2017   Procedure: LEFT HEART CATH AND CORONARY ANGIOGRAPHY;  Surgeon: Burnell Blanks, MD;  Location: [place] [facility];  Service: Cardiovascular;  Laterality: N/A;   LEFT HEART CATH AND CORS/GRAFTS ANGIOGRAPHY N/A 09/26/2016   Procedure: Left Heart Cath and Cors/Grafts Angiography;  Surgeon: Leonie Man, MD;  Location: [place] [facility];  Service: Cardiovascular;  Laterality: N/A;   LEFT HEART CATH AND CORS/GRAFTS ANGIOGRAPHY N/A 06/25/2018   Procedure: LEFT HEART CATH AND CORS/GRAFTS ANGIOGRAPHY;  Surgeon: Martinique, Peter M, MD;  Location: [place] [facility];  Service: Cardiovascular;  Laterality: N/A;   LEFT HEART CATH AND CORS/GRAFTS ANGIOGRAPHY N/A 08/12/2019   Procedure: LEFT HEART CATH AND CORS/GRAFTS ANGIOGRAPHY;  Surgeon: Belva Crome, MD;  Location: [place] [facility];  Service: Cardiovascular;  Laterality: N/A;   LEFT HEART CATH AND CORS/GRAFTS ANGIOGRAPHY N/A 03/08/2020   Procedure: LEFT HEART CATH AND CORS/GRAFTS ANGIOGRAPHY;  Surgeon: Martinique, Peter M, MD;  Location: [place] [facility];  Service: Cardiovascular;  Laterality: N/A;   NM MYOVIEW LTD  06/03/2012   EF 71%   POSTERIOR FUSION LUMBAR SPINE  1990s   TOTAL KNEE ARTHROPLASTY Left 02/26/2021   Procedure: TOTAL KNEE ARTHROPLASTY;  Surgeon: Paralee Cancel, MD;  [facility];  Service: Orthopedics;  Laterality: Left;   TUBAL LIGATION     VAGINAL HYSTERECTOMY     "partial"    Current Facility-Administered  Medications  Medication Dose Route Frequency Provider Last Rate Last Admin   sodium chloride flush (NS) 0.9 % injection 3 mL  3 mL Intravenous Q12H Almyra Deforest, [place]       Current Outpatient Medications  Medication Sig Dispense Refill Last Dose   Acetaminophen (TYLENOL ARTHRITIS PAIN PO) Take 2 tablets by mouth daily as needed (arthritis pain).      amLODipine (NORVASC) 2.5 MG tablet Take 1 tablet (2.5 mg total) by mouth daily. 90 tablet 3    aspirin EC 81 MG tablet Take 81 mg by mouth daily.      atorvastatin (LIPITOR) 40 MG tablet Take 1 tablet (40 mg total) by mouth daily. 30 tablet 6    Blood Glucose Monitoring Suppl (ACCU-CHEK GUIDE ME) w/Device KIT Use to test BG qid 1 kit 0    cetirizine (ZYRTEC) 10 MG tablet Take 10 mg by mouth daily.      Cholecalciferol 50 MCG (2000 UT) CAPS Take 1 capsule (2,000 Units total) by mouth daily with breakfast. 90 capsule 1    cilostazol (PLETAL) 50 MG tablet Take 1 tablet (50 mg total) by mouth  2 (two) times daily. (Patient not taking: Reported on 08/02/2021) 180 tablet 3    Coenzyme Q10 (CO Q-10) 200 MG CAPS Take 200 mg by mouth daily.      Cyanocobalamin (VITAMIN B-12 PO) Take 3 tablets by mouth daily.      cyclobenzaprine (FLEXERIL) 10 MG tablet Take 10 mg by mouth 3 (three) times daily as needed for muscle spasms.      diclofenac (VOLTAREN) 75 MG EC tablet Take 75 mg by mouth 2 (two) times daily.      diclofenac sodium (VOLTAREN) 1 % GEL Apply 1 application. topically in the morning and at bedtime.      dicyclomine (BENTYL) 10 MG capsule Take 10 mg by mouth every 6 (six) hours as needed for spasms. (Patient not taking: Reported on 08/02/2021)      Docusate Calcium (STOOL SOFTENER PO) Take 1 capsule by mouth daily.      EPINEPHrine 0.3 mg/0.3 mL IJ SOAJ injection Inject 0.3 mg into the muscle as needed for anaphylaxis.      ferrous sulfate 325 (65 FE) MG tablet Take 325 mg by mouth daily.      Fingerstix Lancets MISC Inject into the skin.      fluocinonide  cream (LIDEX) 6.75 % 1 application 3 (three) times daily. (Patient not taking: Reported on 08/02/2021)      fluticasone (FLONASE) 50 MCG/ACT nasal spray Place 1 spray into both nostrils 2 (two) times daily.      furosemide (LASIX) 40 MG tablet Take 40 mg by mouth daily as needed for fluid or edema.      gabapentin (NEURONTIN) 600 MG tablet Take 600 mg by mouth in the morning, at noon, in the evening, and at bedtime.      glucose blood (ACCU-CHEK GUIDE) test strip USE TO CHECK BLOOD GLUCOSE DAILY AS DIRECTED 100 strip 2    HYDROcodone-acetaminophen (NORCO) 7.5-325 MG tablet Take 1-2 tablets by mouth every 6 (six) hours as needed for severe pain (pain score 7-10). (Patient not taking: Reported on 08/02/2021) 42 tablet 0    Iron-Vitamin C (VITRON-C PO) Take 1 tablet by mouth daily.      isosorbide mononitrate (IMDUR) 30 MG 24 hr tablet Take 1 tablet (30 mg total) by mouth daily. 90 tablet 1    Lancets MISC Lancet Softclix. Use to test BG bid. E11.65 200 each 1    lidocaine (LIDODERM) 5 % Place 1 patch onto the skin daily as needed. Remove & Discard patch within 12 hours or as directed by MD (Patient taking differently: Place 2 patches onto the skin See admin instructions. Put 2 patches on back 1-2 times a day as needed for pain) 15 patch 0    metoprolol tartrate (LOPRESSOR) 50 MG tablet Take 1 tablet (50 mg total) by mouth 2 (two) times daily. 180 tablet 2    Multiple Vitamin (MULTIVITAMIN WITH MINERALS) TABS tablet Take 2 tablets by mouth daily.      nitroGLYCERIN (NITROSTAT) 0.4 MG SL tablet Place 1 tablet (0.4 mg total) under the tongue every 5 (five) minutes as needed for chest pain (up to 3 doses). (Patient taking differently: Place 0.4 mg under the tongue every 5 (five) minutes as needed for chest pain.) 25 tablet 3    pantoprazole (PROTONIX) 40 MG tablet Take 40 mg by mouth 2 (two) times daily as needed (acid reflux).      polyethylene glycol (MIRALAX / GLYCOLAX) 17 g packet Take 17 g by mouth daily  as  needed for mild constipation. (Patient not taking: Reported on 08/02/2021) 14 each 0    ranolazine (RANEXA) 1000 MG SR tablet Take 1 tablet (1,000 mg total) by mouth 2 (two) times daily. 56 tablet 0    ticagrelor (BRILINTA) 60 MG TABS tablet Take 1 tablet (60 mg total) by mouth 2 (two) times daily. 180 tablet 3    tiZANidine (ZANAFLEX) 2 MG tablet Take 2 mg by mouth 2 (two) times daily as needed for muscle spasms.      vitamin E 400 UNIT capsule Take 400 Units by mouth daily.      Allergies  Allergen Reactions   Codeine Hives and Shortness Of Breath        Other Anaphylaxis    Nuts   Ciprofloxacin Itching   Valsartan Rash   Adhesive [Tape] Itching    "clear tape" used after surgery."took all her skin off".  Paper take is ok   Sulfa Antibiotics Nausea Only    Social History   Tobacco Use   Smoking status: Never   Smokeless tobacco: Never  Substance Use Topics   Alcohol use: No    Family History  Problem Relation Age of Onset   Heart disease Mother    Cancer - Lung Father      Review of Systems  Constitutional:  Negative for chills and fever.  Respiratory:  Negative for cough and shortness of breath.   Cardiovascular:  Negative for chest pain.  Gastrointestinal:  Negative for nausea and vomiting.  Musculoskeletal:  Positive for arthralgias.    Objective:  Physical Exam Well nourished and well developed. General: Alert and oriented x3, cooperative and pleasant, no acute distress. Head: normocephalic, atraumatic, neck supple. Eyes: EOMI.  Musculoskeletal: Right knee exam: No palpable effusion, warmth erythema Slight genu varum associated with a slight flexion contracture Tenderness medially and anteriorly She flexes to 120 degrees with tightness and mild crepitation  Calves soft and nontender. Motor function intact in LE. Strength 5/5 LE bilaterally. Neuro: Distal pulses 2+. Sensation to light touch intact in LE.  Vital signs in last 24 hours:     Labs:   Estimated body mass index is 28.84 kg/m as calculated from the following:   Height as of 08/02/21: _0  (1.626 m).   Weight as of 08/02/21: 76.2 kg.   Imaging Review Plain radiographs demonstrate severe degenerative joint disease of the right knee(s). The overall alignment isneutral. The bone quality appears to be adequate for age and reported activity level.      Assessment/Plan:  End stage arthritis, right knee   The patient history, physical examination, clinical judgment of the provider and imaging studies are consistent with end stage degenerative joint disease of the right knee(s) and total knee arthroplasty is deemed medically necessary. The treatment options including medical management, injection therapy arthroscopy and arthroplasty were discussed at length. The risks and benefits of total knee arthroplasty were presented and reviewed. The risks due to aseptic loosening, infection, stiffness, patella tracking problems, thromboembolic complications and other imponderables were discussed. The patient acknowledged the explanation, agreed to proceed with the plan and consent was signed. Patient is being admitted for inpatient treatment for surgery, pain control, PT, OT, prophylactic antibiotics, VTE prophylaxis, progressive ambulation and ADL's and discharge planning. The patient is planning to be discharged  home.  Therapy Plans: outpatient therapy at [facility] Disposition: Home with husband Planned DVT Prophylaxis: aspirin and brillinta DME needed: none PCP: Dr. Bobby Rumpf, clearance received Cardiologist: Dr. Debara Pickett, clearance received TXA: IV  Allergies: codeine - states anaphylaxis - was given Norco in 2020 by Dr. Vertell Limber and says this did okay, peanut - anaphylaxis, cipro - itching Anesthesia Concerns: none BMI: 31.1 Last HgbA1c: ~5, says she is diabetic but lifestyle controlled   Other: - oxycodone, cyclobenzaprine, tylenol, celebrex - 2400 mg Gabapentin daily - 5  stents - Had issues with them not covering her brillinta and another heart med    Patient's anticipated LOS is less than 2 midnights, meeting these requirements: - Younger than 40 - Lives within 1 hour of care - Has a competent adult at home to recover with post-op recover - NO history of  - Chronic pain requiring opiods  - Diabetes  - Coronary Artery Disease  - Heart failure  - Heart attack  - Stroke  - DVT/VTE  - Cardiac arrhythmia  - Respiratory Failure/COPD  - Renal failure  - Anemia  - Advanced Liver disease  Costella Hatcher, PA-C Orthopedic Surgery [facility] [place] [phone]

## 2021-08-13 ENCOUNTER — Encounter (HOSPITAL_COMMUNITY): Payer: Self-pay | Admitting: Orthopedic Surgery

## 2021-08-13 ENCOUNTER — Encounter (HOSPITAL_COMMUNITY)
Admission: RE | Disposition: A | Discharge status: Home / Self Care | Payer: Self-pay | Source: Home / Self Care | Attending: Orthopedic Surgery

## 2021-08-13 ENCOUNTER — Other Ambulatory Visit: Payer: Self-pay

## 2021-08-13 ENCOUNTER — Observation Stay (HOSPITAL_COMMUNITY)
Admission: RE | Admit: 2021-08-13 | Discharge: 2021-08-14 | Disposition: A | Discharge status: Home / Self Care | Payer: Medicare Other | Attending: Orthopedic Surgery | Admitting: Orthopedic Surgery

## 2021-08-13 ENCOUNTER — Ambulatory Visit (HOSPITAL_BASED_OUTPATIENT_CLINIC_OR_DEPARTMENT_OTHER): Payer: Medicare Other | Admitting: Anesthesiology

## 2021-08-13 ENCOUNTER — Ambulatory Visit (HOSPITAL_COMMUNITY): Payer: Medicare Other | Admitting: Physician Assistant

## 2021-08-13 DIAGNOSIS — Z8249 Family history of ischemic heart disease and other diseases of the circulatory system: Secondary | ICD-10-CM | POA: Insufficient documentation

## 2021-08-13 DIAGNOSIS — I25110 Atherosclerotic heart disease of native coronary artery with unstable angina pectoris: Secondary | ICD-10-CM | POA: Insufficient documentation

## 2021-08-13 DIAGNOSIS — I252 Old myocardial infarction: Secondary | ICD-10-CM | POA: Insufficient documentation

## 2021-08-13 DIAGNOSIS — Z955 Presence of coronary angioplasty implant and graft: Secondary | ICD-10-CM | POA: Insufficient documentation

## 2021-08-13 DIAGNOSIS — R0609 Other forms of dyspnea: Secondary | ICD-10-CM | POA: Insufficient documentation

## 2021-08-13 DIAGNOSIS — M797 Fibromyalgia: Secondary | ICD-10-CM | POA: Diagnosis not present

## 2021-08-13 DIAGNOSIS — I10 Essential (primary) hypertension: Secondary | ICD-10-CM | POA: Diagnosis not present

## 2021-08-13 DIAGNOSIS — D759 Disease of blood and blood-forming organs, unspecified: Secondary | ICD-10-CM | POA: Diagnosis not present

## 2021-08-13 DIAGNOSIS — I25119 Atherosclerotic heart disease of native coronary artery with unspecified angina pectoris: Secondary | ICD-10-CM | POA: Diagnosis not present

## 2021-08-13 DIAGNOSIS — Z79899 Other long term (current) drug therapy: Secondary | ICD-10-CM | POA: Insufficient documentation

## 2021-08-13 DIAGNOSIS — E119 Type 2 diabetes mellitus without complications: Secondary | ICD-10-CM | POA: Insufficient documentation

## 2021-08-13 DIAGNOSIS — Z7982 Long term (current) use of aspirin: Secondary | ICD-10-CM | POA: Diagnosis not present

## 2021-08-13 DIAGNOSIS — G8929 Other chronic pain: Secondary | ICD-10-CM | POA: Diagnosis not present

## 2021-08-13 DIAGNOSIS — M549 Dorsalgia, unspecified: Secondary | ICD-10-CM | POA: Insufficient documentation

## 2021-08-13 DIAGNOSIS — M1711 Unilateral primary osteoarthritis, right knee: Secondary | ICD-10-CM | POA: Diagnosis not present

## 2021-08-13 DIAGNOSIS — E1151 Type 2 diabetes mellitus with diabetic peripheral angiopathy without gangrene: Secondary | ICD-10-CM | POA: Diagnosis not present

## 2021-08-13 DIAGNOSIS — M25561 Pain in right knee: Secondary | ICD-10-CM | POA: Diagnosis not present

## 2021-08-13 DIAGNOSIS — E785 Hyperlipidemia, unspecified: Secondary | ICD-10-CM | POA: Insufficient documentation

## 2021-08-13 DIAGNOSIS — Z79891 Long term (current) use of opiate analgesic: Secondary | ICD-10-CM | POA: Diagnosis not present

## 2021-08-13 DIAGNOSIS — Z951 Presence of aortocoronary bypass graft: Secondary | ICD-10-CM | POA: Insufficient documentation

## 2021-08-13 DIAGNOSIS — Z9884 Bariatric surgery status: Secondary | ICD-10-CM | POA: Insufficient documentation

## 2021-08-13 DIAGNOSIS — R262 Difficulty in walking, not elsewhere classified: Secondary | ICD-10-CM | POA: Insufficient documentation

## 2021-08-13 DIAGNOSIS — I2510 Atherosclerotic heart disease of native coronary artery without angina pectoris: Secondary | ICD-10-CM | POA: Diagnosis not present

## 2021-08-13 DIAGNOSIS — D6489 Other specified anemias: Secondary | ICD-10-CM | POA: Insufficient documentation

## 2021-08-13 DIAGNOSIS — D62 Acute posthemorrhagic anemia: Secondary | ICD-10-CM | POA: Diagnosis not present

## 2021-08-13 DIAGNOSIS — Z96652 Presence of left artificial knee joint: Secondary | ICD-10-CM | POA: Diagnosis not present

## 2021-08-13 DIAGNOSIS — K219 Gastro-esophageal reflux disease without esophagitis: Secondary | ICD-10-CM | POA: Diagnosis not present

## 2021-08-13 DIAGNOSIS — M25761 Osteophyte, right knee: Secondary | ICD-10-CM | POA: Insufficient documentation

## 2021-08-13 DIAGNOSIS — Z7984 Long term (current) use of oral hypoglycemic drugs: Secondary | ICD-10-CM | POA: Insufficient documentation

## 2021-08-13 DIAGNOSIS — Z96651 Presence of right artificial knee joint: Secondary | ICD-10-CM

## 2021-08-13 HISTORY — PX: TOTAL KNEE ARTHROPLASTY: SHX125

## 2021-08-13 LAB — GLUCOSE, CAPILLARY
Glucose-Capillary: 97 mg/dL (ref 70–99)
Glucose-Capillary: 108 mg/dL — ABNORMAL HIGH (ref 70–99)

## 2021-08-13 SURGERY — ARTHROPLASTY, KNEE, TOTAL
Anesthesia: Monitor Anesthesia Care | Site: Knee | Laterality: Right

## 2021-08-13 MED ORDER — TRANEXAMIC ACID-NACL 1000-0.7 MG/100ML-% IV SOLN
1000.0000 mg | INTRAVENOUS | Status: AC
  Administered 2021-08-13: 1000 mg via INTRAVENOUS
  Filled 2021-08-13: qty 100

## 2021-08-13 MED ORDER — DICYCLOMINE HCL 10 MG PO CAPS: 10.0000 mg | ORAL_CAPSULE | Freq: Four times a day (QID) | ORAL | Status: DC | PRN

## 2021-08-13 MED ORDER — AMLODIPINE BESYLATE 5 MG PO TABS
2.5000 mg | ORAL_TABLET | Freq: Every day | ORAL | Status: DC
  Administered 2021-08-14: 2.5 mg via ORAL
  Filled 2021-08-13: qty 1

## 2021-08-13 MED ORDER — KETOROLAC TROMETHAMINE 30 MG/ML IJ SOLN
INTRAMUSCULAR | Status: AC
  Administered 2021-08-13: 30 mg
  Filled 2021-08-13: qty 1

## 2021-08-13 MED ORDER — MENTHOL 3 MG MT LOZG: 1.0000 | LOZENGE | OROMUCOSAL | Status: DC | PRN

## 2021-08-13 MED ORDER — FENTANYL CITRATE PF 50 MCG/ML IJ SOSY: 25.0000 ug | PREFILLED_SYRINGE | INTRAMUSCULAR | Status: DC | PRN

## 2021-08-13 MED ORDER — PANTOPRAZOLE SODIUM 40 MG PO TBEC: 40.0000 mg | DELAYED_RELEASE_TABLET | Freq: Two times a day (BID) | ORAL | Status: DC | PRN

## 2021-08-13 MED ORDER — GABAPENTIN 300 MG PO CAPS
600.0000 mg | ORAL_CAPSULE | Freq: Three times a day (TID) | ORAL | Status: DC
  Administered 2021-08-13 – 2021-08-14 (×3): 600 mg via ORAL
  Filled 2021-08-13 (×3): qty 2

## 2021-08-13 MED ORDER — DEXAMETHASONE SODIUM PHOSPHATE 10 MG/ML IJ SOLN
INTRAMUSCULAR | Status: DC | PRN
  Administered 2021-08-13: 5 mg

## 2021-08-13 MED ORDER — 0.9 % SODIUM CHLORIDE (POUR BTL) OPTIME
TOPICAL | Status: DC | PRN
  Administered 2021-08-13: 1000 mL

## 2021-08-13 MED ORDER — PROPOFOL 10 MG/ML IV BOLUS
INTRAVENOUS | Status: AC
  Filled 2021-08-13: qty 20

## 2021-08-13 MED ORDER — SODIUM CHLORIDE (PF) 0.9 % IJ SOLN
INTRAMUSCULAR | Status: AC
  Filled 2021-08-13: qty 10

## 2021-08-13 MED ORDER — LACTATED RINGERS IV SOLN: INTRAVENOUS | Status: DC

## 2021-08-13 MED ORDER — MEPERIDINE HCL 50 MG/ML IJ SOLN
INTRAMUSCULAR | Status: AC
  Filled 2021-08-13: qty 1

## 2021-08-13 MED ORDER — POLYETHYLENE GLYCOL 3350 17 G PO PACK: 17.0000 g | PACK | Freq: Every day | ORAL | Status: DC | PRN

## 2021-08-13 MED ORDER — PROPOFOL 1000 MG/100ML IV EMUL
INTRAVENOUS | Status: AC
  Filled 2021-08-13: qty 100

## 2021-08-13 MED ORDER — PROPOFOL 500 MG/50ML IV EMUL: INTRAVENOUS | Status: DC | PRN

## 2021-08-13 MED ORDER — ISOSORBIDE MONONITRATE ER 30 MG PO TB24
30.0000 mg | ORAL_TABLET | Freq: Every day | ORAL | Status: DC
  Administered 2021-08-13 – 2021-08-14 (×2): 30 mg via ORAL
  Filled 2021-08-13 (×2): qty 1

## 2021-08-13 MED ORDER — STERILE WATER FOR IRRIGATION IR SOLN
Status: DC | PRN
  Administered 2021-08-13: 2000 mL

## 2021-08-13 MED ORDER — TICAGRELOR 60 MG PO TABS
60.0000 mg | ORAL_TABLET | Freq: Two times a day (BID) | ORAL | Status: DC
  Filled 2021-08-13: qty 1

## 2021-08-13 MED ORDER — CHLORHEXIDINE GLUCONATE 0.12 % MT SOLN
15.0000 mL | Freq: Once | OROMUCOSAL | Status: AC
  Administered 2021-08-13: 15 mL via OROMUCOSAL

## 2021-08-13 MED ORDER — GABAPENTIN 600 MG PO TABS
600.0000 mg | ORAL_TABLET | Freq: Three times a day (TID) | ORAL | Status: DC
Start: 2021-08-13 — End: 2021-08-13
  Filled 2021-08-13 (×2): qty 1

## 2021-08-13 MED ORDER — FLUTICASONE PROPIONATE 50 MCG/ACT NA SUSP
1.0000 | Freq: Two times a day (BID) | NASAL | Status: DC
  Administered 2021-08-13 – 2021-08-14 (×2): 1 via NASAL
  Filled 2021-08-13: qty 16

## 2021-08-13 MED ORDER — FENTANYL CITRATE (PF) 100 MCG/2ML IJ SOLN
INTRAMUSCULAR | Status: DC | PRN
  Administered 2021-08-13 (×2): 50 ug via INTRAVENOUS

## 2021-08-13 MED ORDER — OXYCODONE HCL 5 MG PO TABS
10.0000 mg | ORAL_TABLET | ORAL | Status: DC | PRN
  Administered 2021-08-14: 10 mg via ORAL
  Filled 2021-08-13: qty 2

## 2021-08-13 MED ORDER — SODIUM CHLORIDE (PF) 0.9 % IJ SOLN
INTRAMUSCULAR | Status: DC | PRN
  Administered 2021-08-13: 30 mL

## 2021-08-13 MED ORDER — BUPIVACAINE IN DEXTROSE 0.75-8.25 % IT SOLN
INTRATHECAL | Status: DC | PRN
  Administered 2021-08-13: 1.6 mL via INTRATHECAL

## 2021-08-13 MED ORDER — METOCLOPRAMIDE HCL 5 MG PO TABS
5.0000 mg | ORAL_TABLET | Freq: Three times a day (TID) | ORAL | Status: DC | PRN
  Filled 2021-08-13: qty 2

## 2021-08-13 MED ORDER — FERROUS SULFATE 325 (65 FE) MG PO TABS
325.0000 mg | ORAL_TABLET | Freq: Three times a day (TID) | ORAL | Status: DC
  Administered 2021-08-13 – 2021-08-14 (×2): 325 mg via ORAL
  Filled 2021-08-13 (×2): qty 1

## 2021-08-13 MED ORDER — DEXAMETHASONE SODIUM PHOSPHATE 10 MG/ML IJ SOLN
8.0000 mg | Freq: Once | INTRAMUSCULAR | Status: AC
  Administered 2021-08-13: 10 mg via INTRAVENOUS

## 2021-08-13 MED ORDER — OXYCODONE HCL 5 MG PO TABS
5.0000 mg | ORAL_TABLET | ORAL | Status: DC | PRN
  Administered 2021-08-13: 10 mg via ORAL
  Administered 2021-08-13: 5 mg via ORAL
  Filled 2021-08-13: qty 2

## 2021-08-13 MED ORDER — ONDANSETRON HCL 4 MG/2ML IJ SOLN: 4.0000 mg | Freq: Four times a day (QID) | INTRAMUSCULAR | Status: DC | PRN

## 2021-08-13 MED ORDER — METOPROLOL TARTRATE 50 MG PO TABS
50.0000 mg | ORAL_TABLET | Freq: Two times a day (BID) | ORAL | Status: DC
  Administered 2021-08-13 – 2021-08-14 (×2): 50 mg via ORAL
  Filled 2021-08-13 (×2): qty 1

## 2021-08-13 MED ORDER — MEPERIDINE HCL 50 MG/ML IJ SOLN
6.2500 mg | INTRAMUSCULAR | Status: DC | PRN
  Administered 2021-08-13: 12.5 mg via INTRAVENOUS

## 2021-08-13 MED ORDER — SODIUM CHLORIDE 0.9 % IR SOLN
Status: DC | PRN
  Administered 2021-08-13: 1000 mL

## 2021-08-13 MED ORDER — RANOLAZINE ER 500 MG PO TB12
1000.0000 mg | ORAL_TABLET | Freq: Two times a day (BID) | ORAL | Status: DC
  Administered 2021-08-13 – 2021-08-14 (×2): 1000 mg via ORAL
  Filled 2021-08-13 (×2): qty 2

## 2021-08-13 MED ORDER — KETOROLAC TROMETHAMINE 30 MG/ML IJ SOLN
INTRAMUSCULAR | Status: DC | PRN
  Administered 2021-08-13: 30 mg

## 2021-08-13 MED ORDER — HYDRALAZINE HCL 20 MG/ML IJ SOLN
INTRAMUSCULAR | Status: AC
  Filled 2021-08-13: qty 1

## 2021-08-13 MED ORDER — FUROSEMIDE 40 MG PO TABS
40.0000 mg | ORAL_TABLET | Freq: Every day | ORAL | Status: DC | PRN
Start: 2021-08-14 — End: 2021-08-14

## 2021-08-13 MED ORDER — PROPOFOL 500 MG/50ML IV EMUL
INTRAVENOUS | Status: DC | PRN
  Administered 2021-08-13 (×2): 30 mg via INTRAVENOUS
  Administered 2021-08-13: 80 ug/kg/min via INTRAVENOUS

## 2021-08-13 MED ORDER — SODIUM CHLORIDE 0.9 % IV SOLN: INTRAVENOUS | Status: DC

## 2021-08-13 MED ORDER — DOCUSATE SODIUM 100 MG PO CAPS
100.0000 mg | ORAL_CAPSULE | Freq: Two times a day (BID) | ORAL | Status: DC
Start: 2021-08-13 — End: 2021-08-14
  Administered 2021-08-13 – 2021-08-14 (×2): 100 mg via ORAL
  Filled 2021-08-13 (×2): qty 1

## 2021-08-13 MED ORDER — MIDAZOLAM HCL 5 MG/5ML IJ SOLN
INTRAMUSCULAR | Status: DC | PRN
  Administered 2021-08-13: 2 mg via INTRAVENOUS

## 2021-08-13 MED ORDER — MIDAZOLAM HCL 2 MG/2ML IJ SOLN
INTRAMUSCULAR | Status: AC
  Filled 2021-08-13: qty 2

## 2021-08-13 MED ORDER — HYDROMORPHONE HCL 1 MG/ML IJ SOLN
INTRAMUSCULAR | Status: AC
  Administered 2021-08-13: 1 mg via INTRAVENOUS
  Filled 2021-08-13: qty 1

## 2021-08-13 MED ORDER — POVIDONE-IODINE 10 % EX SWAB
2.0000 "application " | Freq: Once | CUTANEOUS | Status: AC
  Administered 2021-08-13: 2 via TOPICAL

## 2021-08-13 MED ORDER — BUPIVACAINE-EPINEPHRINE (PF) 0.25% -1:200000 IJ SOLN
INTRAMUSCULAR | Status: DC | PRN
  Administered 2021-08-13: 30 mL

## 2021-08-13 MED ORDER — PHENOL 1.4 % MT LIQD: 1.0000 | OROMUCOSAL | Status: DC | PRN

## 2021-08-13 MED ORDER — BISACODYL 10 MG RE SUPP: 10.0000 mg | Freq: Every day | RECTAL | Status: DC | PRN

## 2021-08-13 MED ORDER — BUPIVACAINE-EPINEPHRINE (PF) 0.25% -1:200000 IJ SOLN
INTRAMUSCULAR | Status: AC
  Filled 2021-08-13: qty 30

## 2021-08-13 MED ORDER — ATORVASTATIN CALCIUM 40 MG PO TABS
40.0000 mg | ORAL_TABLET | Freq: Every day | ORAL | Status: DC
  Filled 2021-08-13: qty 1

## 2021-08-13 MED ORDER — HYDRALAZINE HCL 20 MG/ML IJ SOLN
INTRAMUSCULAR | Status: DC | PRN
  Administered 2021-08-13: 5 mg via INTRAVENOUS

## 2021-08-13 MED ORDER — ASPIRIN 81 MG PO TBEC
81.0000 mg | DELAYED_RELEASE_TABLET | Freq: Every day | ORAL | Status: DC
  Administered 2021-08-14: 81 mg via ORAL
  Filled 2021-08-13: qty 1

## 2021-08-13 MED ORDER — METOCLOPRAMIDE HCL 5 MG/ML IJ SOLN: 5.0000 mg | Freq: Three times a day (TID) | INTRAMUSCULAR | Status: DC | PRN

## 2021-08-13 MED ORDER — TRANEXAMIC ACID-NACL 1000-0.7 MG/100ML-% IV SOLN
1000.0000 mg | Freq: Once | INTRAVENOUS | Status: AC
  Administered 2021-08-13: 1000 mg via INTRAVENOUS
  Filled 2021-08-13: qty 100

## 2021-08-13 MED ORDER — KETOROLAC TROMETHAMINE 30 MG/ML IJ SOLN
INTRAMUSCULAR | Status: AC
  Filled 2021-08-13: qty 1

## 2021-08-13 MED ORDER — LORATADINE 10 MG PO TABS
10.0000 mg | ORAL_TABLET | Freq: Every day | ORAL | Status: DC
  Administered 2021-08-13 – 2021-08-14 (×2): 10 mg via ORAL
  Filled 2021-08-13 (×2): qty 1

## 2021-08-13 MED ORDER — DEXAMETHASONE SODIUM PHOSPHATE 10 MG/ML IJ SOLN
10.0000 mg | Freq: Once | INTRAMUSCULAR | Status: AC
  Administered 2021-08-14: 10 mg via INTRAVENOUS
  Filled 2021-08-13: qty 1

## 2021-08-13 MED ORDER — HYDROMORPHONE HCL 1 MG/ML IJ SOLN
0.5000 mg | INTRAMUSCULAR | Status: DC | PRN
  Administered 2021-08-13: 1 mg via INTRAVENOUS
  Filled 2021-08-13: qty 1

## 2021-08-13 MED ORDER — ONDANSETRON HCL 4 MG PO TABS
4.0000 mg | ORAL_TABLET | Freq: Four times a day (QID) | ORAL | Status: DC | PRN
  Filled 2021-08-13: qty 1

## 2021-08-13 MED ORDER — FENTANYL CITRATE (PF) 100 MCG/2ML IJ SOLN
INTRAMUSCULAR | Status: AC
  Filled 2021-08-13: qty 2

## 2021-08-13 MED ORDER — CYCLOBENZAPRINE HCL 10 MG PO TABS: 10.0000 mg | ORAL_TABLET | Freq: Three times a day (TID) | ORAL | Status: DC | PRN

## 2021-08-13 MED ORDER — ROPIVACAINE HCL 7.5 MG/ML IJ SOLN
INTRAMUSCULAR | Status: DC | PRN
  Administered 2021-08-13: 20 mL via PERINEURAL

## 2021-08-13 MED ORDER — CEFAZOLIN SODIUM-DEXTROSE 2-4 GM/100ML-% IV SOLN
2.0000 g | INTRAVENOUS | Status: AC
  Administered 2021-08-13: 2 g via INTRAVENOUS
  Filled 2021-08-13: qty 100

## 2021-08-13 MED ORDER — ACETAMINOPHEN 500 MG PO TABS
1000.0000 mg | ORAL_TABLET | Freq: Four times a day (QID) | ORAL | Status: DC
  Administered 2021-08-13 – 2021-08-14 (×4): 1000 mg via ORAL
  Filled 2021-08-13 (×4): qty 2

## 2021-08-13 MED ORDER — ORAL CARE MOUTH RINSE: 15.0000 mL | Freq: Once | OROMUCOSAL | Status: AC

## 2021-08-13 MED ORDER — ACETAMINOPHEN 10 MG/ML IV SOLN: 1000.0000 mg | Freq: Once | INTRAVENOUS | Status: DC | PRN

## 2021-08-13 MED ORDER — OXYCODONE HCL 5 MG PO TABS
ORAL_TABLET | ORAL | Status: AC
  Filled 2021-08-13: qty 1

## 2021-08-13 MED ORDER — CEFAZOLIN SODIUM-DEXTROSE 2-4 GM/100ML-% IV SOLN
2.0000 g | Freq: Four times a day (QID) | INTRAVENOUS | Status: AC
  Administered 2021-08-13: 2 g via INTRAVENOUS
  Filled 2021-08-13: qty 100

## 2021-08-13 MED ORDER — SODIUM CHLORIDE (PF) 0.9 % IJ SOLN
INTRAMUSCULAR | Status: AC
  Filled 2021-08-13: qty 30

## 2021-08-13 MED ORDER — DIPHENHYDRAMINE HCL 12.5 MG/5ML PO ELIX: 12.5000 mg | ORAL_SOLUTION | ORAL | Status: DC | PRN

## 2021-08-13 SURGICAL SUPPLY — 61 items
ADH SKN CLS APL DERMABOND .7 (GAUZE/BANDAGES/DRESSINGS) ×1
ATTUNE MED ANAT PAT 35 KNEE (Knees) ×1 IMPLANT
ATTUNE PS FEM RT SZ 4 CEM KNEE (Femur) ×1 IMPLANT
ATTUNE PSRP INSR SZ4 6 KNEE (Insert) ×1 IMPLANT
BAG COUNTER SPONGE SURGICOUNT (BAG) ×1 IMPLANT
BAG SPEC THK2 15X12 ZIP CLS (MISCELLANEOUS) ×1
BAG SPNG CNTER NS LX DISP (BAG) ×1
BAG ZIPLOCK 12X15 (MISCELLANEOUS) ×1 IMPLANT
BASEPLATE TIBIAL ROTATING SZ 4 (Knees) ×1 IMPLANT
BLADE SAW SGTL 11.0X1.19X90.0M (BLADE) ×1 IMPLANT
BLADE SAW SGTL 13.0X1.19X90.0M (BLADE) ×2 IMPLANT
BNDG ELASTIC 6X5.8 VLCR STR LF (GAUZE/BANDAGES/DRESSINGS) ×2 IMPLANT
BOWL SMART MIX CTS (DISPOSABLE) ×2 IMPLANT
BSPLAT TIB 4 CMNT ROT PLAT STR (Knees) ×1 IMPLANT
CEMENT HV SMART SET (Cement) ×2 IMPLANT
CLSR STERI-STRIP ANTIMIC 1/2X4 (GAUZE/BANDAGES/DRESSINGS) ×1 IMPLANT
CUFF TOURN SGL QUICK 34 (TOURNIQUET CUFF) ×2
CUFF TRNQT CYL 34X4.125X (TOURNIQUET CUFF) ×1 IMPLANT
DERMABOND ADVANCED (GAUZE/BANDAGES/DRESSINGS) ×1
DERMABOND ADVANCED .7 DNX12 (GAUZE/BANDAGES/DRESSINGS) ×1 IMPLANT
DRAPE SHEET LG 3/4 BI-LAMINATE (DRAPES) ×2 IMPLANT
DRAPE U-SHAPE 47X51 STRL (DRAPES) ×2 IMPLANT
DRESSING AQUACEL AG SP 3.5X10 (GAUZE/BANDAGES/DRESSINGS) ×1 IMPLANT
DRSG AQUACEL AG ADV 3.5X10 (GAUZE/BANDAGES/DRESSINGS) ×1 IMPLANT
DRSG AQUACEL AG SP 3.5X10 (GAUZE/BANDAGES/DRESSINGS) ×2
DURAPREP 26ML APPLICATOR (WOUND CARE) ×4 IMPLANT
ELECT REM PT RETURN 15FT ADLT (MISCELLANEOUS) ×2 IMPLANT
FACESHIELD WRAPAROUND (MASK) ×2 IMPLANT
FACESHIELD WRAPAROUND OR TEAM (MASK) ×1 IMPLANT
GLOVE BIO SURGEON STRL SZ 6 (GLOVE) ×3 IMPLANT
GLOVE BIOGEL PI IND STRL 6.5 (GLOVE) ×1 IMPLANT
GLOVE BIOGEL PI IND STRL 7.5 (GLOVE) ×1 IMPLANT
GLOVE BIOGEL PI INDICATOR 6.5 (GLOVE) ×4
GLOVE BIOGEL PI INDICATOR 7.5 (GLOVE) ×3
GLOVE ORTHO TXT STRL SZ7.5 (GLOVE) ×4 IMPLANT
GOWN STRL REUS W/ TWL LRG LVL3 (GOWN DISPOSABLE) ×3 IMPLANT
GOWN STRL REUS W/TWL LRG LVL3 (GOWN DISPOSABLE) ×6
HANDPIECE INTERPULSE COAX TIP (DISPOSABLE) ×2
HOLDER FOLEY CATH W/STRAP (MISCELLANEOUS) IMPLANT
KIT TURNOVER KIT A (KITS) ×1 IMPLANT
MANIFOLD NEPTUNE II (INSTRUMENTS) ×2 IMPLANT
NDL SAFETY ECLIPSE 18X1.5 (NEEDLE) IMPLANT
NEEDLE HYPO 18GX1.5 SHARP (NEEDLE)
NS IRRIG 1000ML POUR BTL (IV SOLUTION) ×2 IMPLANT
PACK TOTAL KNEE CUSTOM (KITS) ×2 IMPLANT
PROTECTOR NERVE ULNAR (MISCELLANEOUS) ×2 IMPLANT
SET HNDPC FAN SPRY TIP SCT (DISPOSABLE) ×1 IMPLANT
SET PAD KNEE POSITIONER (MISCELLANEOUS) ×2 IMPLANT
SPIKE FLUID TRANSFER (MISCELLANEOUS) ×4 IMPLANT
SUT MNCRL AB 4-0 PS2 18 (SUTURE) ×2 IMPLANT
SUT STRATAFIX PDS+ 0 24IN (SUTURE) ×2 IMPLANT
SUT VIC AB 1 CT1 36 (SUTURE) ×2 IMPLANT
SUT VIC AB 2-0 CT1 27 (SUTURE) ×4
SUT VIC AB 2-0 CT1 TAPERPNT 27 (SUTURE) ×2 IMPLANT
SYR 3ML LL SCALE MARK (SYRINGE) ×2 IMPLANT
TOWEL GREEN STERILE FF (TOWEL DISPOSABLE) ×2 IMPLANT
TRAY CATH INTERMITTENT SS 16FR (CATHETERS) ×2 IMPLANT
TRAY FOLEY MTR SLVR 16FR STAT (SET/KITS/TRAYS/PACK) ×2 IMPLANT
TUBE SUCTION HIGH CAP CLEAR NV (SUCTIONS) ×2 IMPLANT
WATER STERILE IRR 1000ML POUR (IV SOLUTION) ×4 IMPLANT
WRAP KNEE MAXI GEL POST OP (GAUZE/BANDAGES/DRESSINGS) ×2 IMPLANT

## 2021-08-13 NOTE — Anesthesia Procedure Notes (Signed)
Spinal

## 2021-08-13 NOTE — Transfer of Care (Signed)
Immediate Anesthesia Transfer of Care Note  Patient: Tamara Nunez  Procedure(s) Performed: TOTAL KNEE ARTHROPLASTY (Right: Knee)  Patient Location: PACU  Anesthesia Type:Spinal  Level of Consciousness: drowsy  Airway & Oxygen Therapy: Patient Spontanous Breathing  Post-op Assessment: Report given to RN  Post vital signs: stable  Last Vitals:  Vitals Value Taken Time  BP 168/89 08/13/21 0901  Temp    Pulse 94 08/13/21 0906  Resp 25 08/13/21 0906  SpO2 97 % 08/13/21 0906  Vitals shown include unvalidated device data.  Last Pain:  Vitals:   08/13/21 0609  TempSrc:   PainSc: 6          Complications: No notable events documented.

## 2021-08-13 NOTE — Evaluation (Signed)
Physical Therapy Evaluation Patient Details Name: Tamara Nunez MRN: [id number] DOB: [birth year] Today's Date: 08/13/2021  History of Present Illness  Pt is a 66 year old female s/p Right TKA on 08/13/21.  PMHx significant for L TKA 02/26/21, CAD s/p CABGx2, back pain, fibromyalgia, HLD, HTN, DMII, gastric bypass  Clinical Impression  Pt is s/p TKA resulting in the deficits listed below (see PT Problem List). Pt will benefit from skilled PT to increase their independence and safety with mobility to allow discharge to the venue listed below.  Pt hesitant to mobilize however confidence improved with mobility.  Pt ambulated 60 feet with RW POD #0 and plans to d/c home with spouse likely tomorrow.      Recommendations for follow up therapy are one component of a multi-disciplinary discharge planning process, led by the attending physician.  Recommendations may be updated based on patient status, additional functional criteria and insurance authorization.  Follow Up Recommendations Follow physician's recommendations for discharge plan and follow up therapies    Assistance Recommended at Discharge    Patient can return home with the following       Equipment Recommendations None recommended by PT  Recommendations for Other Services       Functional Status Assessment Patient has had a recent decline in their functional status and demonstrates the ability to make significant improvements in function in a reasonable and predictable amount of time.     Precautions / Restrictions Precautions Precautions: Fall;Knee Restrictions Weight Bearing Restrictions: No Other Position/Activity Restrictions: WBAT      Mobility  Bed Mobility Overal bed mobility: Needs Assistance Bed Mobility: Supine to Sit     Supine to sit: Min guard, HOB elevated     General bed mobility comments: cues for technique    Transfers Overall transfer level: Needs assistance Equipment used: Rolling walker (2  wheels) Transfers: Sit to/from Stand Sit to Stand: Min assist           General transfer comment: verbal cues for UE and LE positioning, assist to rise    Ambulation/Gait Ambulation/Gait assistance: Min guard Gait Distance (Feet): 60 Feet Assistive device: Rolling walker (2 wheels) Gait Pattern/deviations: Step-to pattern, Decreased stance time - right       General Gait Details: verbal cues for sequence, posture, RW positioning, distance to tolerance  Stairs            Wheelchair Mobility    Modified Rankin (Stroke Patients Only)       Balance                                             Pertinent Vitals/Pain Pain Assessment Pain Assessment: 0-10 Pain Score: 8  Pain Location: right knee Pain Descriptors / Indicators: Sore, Aching Pain Intervention(s): RN gave pain meds during session, Repositioned, Monitored during session (RN gave IV pain meds at beginning of session)    Home Living Family/patient expects to be discharged to:: Private residence Living Arrangements: Spouse/significant other Available Help at Discharge: Family Type of Home: House Home Access: Stairs to enter Entrance Stairs-Rails: None Entrance Stairs-Number of Steps: 2   Home Layout: One level Home Equipment: Agricultural consultant (2 wheels)      Prior Function Prior Level of Function : Independent/Modified Independent  Hand Dominance        Extremity/Trunk Assessment        Lower Extremity Assessment Lower Extremity Assessment: RLE deficits/detail RLE Deficits / Details: unable to perform SLR, good quad contraction, able to perform ankle pumps       Communication   Communication: No difficulties  Cognition Arousal/Alertness: Awake/alert Behavior During Therapy: WFL for tasks assessed/performed Overall Cognitive Status: Within Functional Limits for tasks assessed                                           General Comments      Exercises     Assessment/Plan    PT Assessment Patient needs continued PT services  PT Problem List Decreased range of motion;Decreased strength;Decreased mobility;Decreased knowledge of precautions;Pain       PT Treatment Interventions Stair training;Gait training;DME instruction;Therapeutic exercise;Balance training;Functional mobility training;Therapeutic activities;Patient/family education    PT Goals (Current goals can be found in the Care Plan section)  Acute Rehab PT Goals PT Goal Formulation: With patient Time For Goal Achievement: 08/17/21 Potential to Achieve Goals: Good    Frequency 7X/week     Co-evaluation               AM-PAC PT "6 Clicks" Mobility  Outcome Measure Help needed turning from your back to your side while in a flat bed without using bedrails?: A Little Help needed moving from lying on your back to sitting on the side of a flat bed without using bedrails?: A Little Help needed moving to and from a bed to a chair (including a wheelchair)?: A Little Help needed standing up from a chair using your arms (e.g., wheelchair or bedside chair)?: A Little Help needed to walk in hospital room?: A Little Help needed climbing 3-5 steps with a railing? : A Little 6 Click Score: 18    End of Session Equipment Utilized During Treatment: Gait belt Activity Tolerance: Patient tolerated treatment well Patient left: in chair;with call bell/phone within reach;with chair alarm set Nurse Communication: Mobility status PT Visit Diagnosis: Difficulty in walking, not elsewhere classified (R26.2)    Time: 7026-3785 PT Time Calculation (min) (ACUTE ONLY): 18 min   Charges:   PT Evaluation $PT Eval Low Complexity: 1 Low         Kati PT, DPT Acute Rehabilitation Services Pager: [phone] Office: [phone]   Tamara Nunez 08/13/2021, 4:46 PM

## 2021-08-13 NOTE — Discharge Instructions (Signed)

## 2021-08-13 NOTE — Anesthesia Procedure Notes (Signed)
Anesthesia Regional Block: Adductor canal block   Pre-Anesthetic Checklist: , timeout performed,  Correct Patient, Correct Site, Correct Laterality,  Correct Procedure, Correct Position, site marked,  Risks and benefits discussed,  Surgical consent,  Pre-op evaluation,  At surgeon's request and post-op pain management  Laterality: Right  Prep: Dura Prep       Needles:  Injection technique: Single-shot  Needle Type: Echogenic Stimulator Needle     Needle Length: 10cm  Needle Gauge: 20     Additional Needles:   Procedures:,,,, ultrasound used (permanent image in chart),,    Narrative:  Start time: 08/13/2021 6:53 AM End time: 08/13/2021 6:57 AM Injection made incrementally with aspirations every 5 mL.  Performed by: Personally  Anesthesiologist: Atilano Median, DO  Additional Notes: Patient identified. Risks/Benefits/Options discussed with patient including but not limited to bleeding, infection, nerve damage, failed block, incomplete pain control. Patient expressed understanding and wished to proceed. All questions were answered. Sterile technique was used throughout the entire procedure. Please see nursing notes for vital signs. Aspirated in 5cc intervals with injection for negative confirmation. Patient was given instructions on fall risk and not to get out of bed. All questions and concerns addressed with instructions to call with any issues or inadequate analgesia.

## 2021-08-13 NOTE — Op Note (Signed)
NAME:  Tamara Nunez                      MEDICAL RECORD NO.:  [id number]                             FACILITY:  [facility]      PHYSICIAN:  Madlyn Frankel. Charlann Boxer, M.D.  DATE OF BIRTH:  1955-06-06      DATE OF PROCEDURE:  08/13/2021                                     OPERATIVE REPORT         PREOPERATIVE DIAGNOSIS:  Right knee osteoarthritis.      POSTOPERATIVE DIAGNOSIS:  Right knee osteoarthritis.      FINDINGS:  The patient was noted to have complete loss of cartilage and   bone-on-bone arthritis with associated osteophytes in the medial and patellofemoral compartments of   the knee with a significant synovitis and associated effusion.  The patient had failed months of conservative treatment including medications, injection therapy, activity modification.     PROCEDURE:  Right total knee replacement.      COMPONENTS USED:  DePuy Attune rotating platform posterior stabilized knee   system, a size 4 femur, 4 tibia, size 6 mm PS AOX insert, and 35 anatomic patellar   button.      SURGEON:  Madlyn Frankel. Charlann Boxer, M.D.      ASSISTANT:  Rosalene Billings, PA-C.      ANESTHESIA:  Regional and Spinal.      SPECIMENS:  None.      COMPLICATION:  None.      DRAINS:  None.  EBL: 300cc      TOURNIQUET TIME:   Total Tourniquet Time Documented: Thigh (Right) - 4 minutes Thigh (Right) - 6 minutes Total: Thigh (Right) - 10 minutes  .      The patient was stable to the recovery room.      INDICATION FOR PROCEDURE:  Tamara Nunez is a 66 y.o. female patient of   mine.  The patient had been seen, evaluated, and treated for months conservatively in the   office with medication, activity modification, and injections.  The patient had   radiographic changes of bone-on-bone arthritis with endplate sclerosis and osteophytes noted.  Based on the radiographic changes and failed conservative measures, the patient   decided to proceed with definitive treatment, total knee replacement.  Risks of infection,  DVT, component failure, need for revision surgery, neurovascular injury were reviewed in the office setting.  The postop course was reviewed stressing the efforts to maximize post-operative satisfaction and function.  Consent was obtained for benefit of pain   relief.      PROCEDURE IN DETAIL:  The patient was brought to the operative theater.   Once adequate anesthesia, preoperative antibiotics, 2 gm of Ancef,1 gm of Tranexamic Acid, and 10 mg of Decadron administered, the patient was positioned supine with a right thigh tourniquet placed.  The  right lower extremity was prepped and draped in sterile fashion.  A time-   out was performed identifying the patient, planned procedure, and the appropriate extremity.      The right lower extremity was placed in the [facility] leg holder.  The leg was   exsanguinated, tourniquet elevated to 250 mmHg.  A midline  incision was   made followed by median parapatellar arthrotomy.  Following initial   exposure, attention was first directed to the patella.  Precut   measurement was noted to be 24 mm.  I resected down to 14 mm and used a   35 anatomic patellar button to restore patellar height as well as cover the cut surface.      The lug holes were drilled and a metal shim was placed to protect the   patella from retractors and saw blade during the procedure.      At this point, attention was now directed to the femur.  The femoral   canal was opened with a drill, irrigated to try to prevent fat emboli.  An   intramedullary rod was passed at 3 degrees valgus, 9 mm of bone was   resected off the distal femur.  Following this resection, the tibia was   subluxated anteriorly.  Using the extramedullary guide, 2 mm of bone was resected off   the proximal medial tibia.  We confirmed the gap would be   stable medially and laterally with a size 5 spacer block as well as confirmed that the tibial cut was perpendicular in the coronal plane, checking with an alignment  rod.      Once this was done, I sized the femur to be a size 4 in the anterior-   posterior dimension, chose a standard component based on medial and   lateral dimension.  The size 4 rotation block was then pinned in   position anterior referenced using the C-clamp to set rotation.  The   anterior, posterior, and  chamfer cuts were made without difficulty nor   notching making certain that I was along the anterior cortex to help   with flexion gap stability.      The final box cut was made off the lateral aspect of distal femur.      At this point, the tibia was sized to be a size 4.  The size 4 tray was   then pinned in position through the medial third of the tubercle,   drilled, and keel punched.  Trial reduction was now carried with a 4 femur,  4 tibia, a size 6 mm PS insert, and the 35 anatomic patella botton.  The knee was brought to full extension with good flexion stability with the patella   tracking through the trochlea without application of pressure.  Given   all these findings the trial components removed.  Final components were   opened and cement was mixed.  The knee was irrigated with normal saline solution and pulse lavage.  The synovial lining was   then injected with 30 cc of 0.25% Marcaine with epinephrine, 1 cc of Toradol and 30 cc of NS for a total of 61 cc.     Final implants were then cemented onto cleaned and dried cut surfaces of bone with the knee brought to extension with a size 6 mm PS trial insert.      Once the cement had fully cured, excess cement was removed   throughout the knee.  I confirmed that I was satisfied with the range of   motion and stability, and the final size 6 mm PS AOX insert was chosen.  It was   placed into the knee.      The tourniquet had been let down after a total of 10 minutes.  The tourniquet was used twice and found to not be  very adequate either related to placement or intra-operative hypertension.  No significant   hemostasis  was required when tourniquet finally let down.  The extensor mechanism was then reapproximated using #1 Vicryl and #1 Stratafix sutures with the knee   in flexion.  The   remaining wound was closed with 2-0 Vicryl and running 4-0 Monocryl.   The knee was cleaned, dried, dressed sterilely using Dermabond and   Aquacel dressing.  The patient was then   brought to recovery room in stable condition, tolerating the procedure   well.   Please note that Physician Assistant, Rosalene BillingsAshley Donovan, PA-C was present for the entirety of the case, and was utilized for pre-operative positioning, peri-operative retractor management, general facilitation of the procedure and for primary wound closure at the end of the case.              Madlyn FrankelMatthew D. Charlann Boxerlin, M.D.    08/13/2021 8:37 AM

## 2021-08-13 NOTE — Plan of Care (Signed)

## 2021-08-13 NOTE — Anesthesia Procedure Notes (Signed)
Spinal  Patient location during procedure: OR Start time: 08/13/2021 7:21 AM End time: 08/13/2021 7:23 AM Staffing Performed: anesthesiologist  Anesthesiologist: Darral Dash, DO Preanesthetic Checklist Completed: patient identified, IV checked, site marked, risks and benefits discussed, surgical consent, monitors and equipment checked, pre-op evaluation and timeout performed Spinal Block Patient position: sitting Prep: DuraPrep Patient monitoring: heart rate, cardiac monitor, continuous pulse ox and blood pressure Approach: midline Location: L5-S1 Injection technique: single-shot Needle Needle type: Pencan  Needle gauge: 24 G Needle length: 10 cm Assessment Events: CSF return Additional Notes Patient identified. Risks/Benefits/Options discussed with patient including but not limited to bleeding, infection, nerve damage, paralysis, failed block, incomplete pain control, headache, blood pressure changes, nausea, vomiting, reactions to medications, itching and postpartum back pain. Confirmed with bedside nurse the patient's most recent platelet count. Confirmed with patient that they are not currently taking any anticoagulation, have any bleeding history or any family history of bleeding disorders. Patient expressed understanding and wished to proceed. All questions were answered. Sterile technique was used throughout the entire procedure. Please see nursing notes for vital signs. Warning signs of high block given to the patient including shortness of breath, tingling/numbness in hands, complete motor block, or any concerning symptoms with instructions to call for help. Patient was given instructions on fall risk and not to get out of bed. All questions and concerns addressed with instructions to call with any issues or inadequate analgesia.

## 2021-08-13 NOTE — Anesthesia Postprocedure Evaluation (Signed)
Anesthesia Post Note  Patient: Tamara Nunez  Procedure(s) Performed: TOTAL KNEE ARTHROPLASTY (Right: Knee)     Patient location during evaluation: PACU Anesthesia Type: Regional and MAC Level of consciousness: awake and alert Pain management: pain level controlled Vital Signs Assessment: post-procedure vital signs reviewed and stable Respiratory status: spontaneous breathing, nonlabored ventilation, respiratory function stable and patient connected to nasal cannula oxygen Cardiovascular status: stable and blood pressure returned to baseline Postop Assessment: no apparent nausea or vomiting Anesthetic complications: no   No notable events documented.  Last Vitals:  Vitals:   08/13/21 1415 08/13/21 1500  BP: 107/64 (!) 151/85  Pulse: 91 91  Resp: 15 16  Temp: 36.7 C 36.7 C  SpO2: 99% 96%    Last Pain:  Vitals:   08/13/21 1541  TempSrc: Oral  PainSc:                  March Rummage Sapir Lavey

## 2021-08-14 DIAGNOSIS — M1711 Unilateral primary osteoarthritis, right knee: Secondary | ICD-10-CM | POA: Diagnosis not present

## 2021-08-14 LAB — BASIC METABOLIC PANEL
Anion gap: 5 (ref 5–15)
BUN: 15 mg/dL (ref 8–23)
CO2: 24 mmol/L (ref 22–32)
Calcium: 8.8 mg/dL — ABNORMAL LOW (ref 8.9–10.3)
Chloride: 112 mmol/L — ABNORMAL HIGH (ref 98–111)
Creatinine, Ser: 0.88 mg/dL (ref 0.44–1.00)
GFR, Estimated: >60 mL/min (ref >60)
Glucose, Bld: 96 mg/dL (ref 70–99)
Potassium: 3.9 mmol/L (ref 3.5–5.1)
Sodium: 141 mmol/L (ref 135–145)

## 2021-08-14 LAB — CBC
HCT: 24.1 % — ABNORMAL LOW (ref 36.0–46.0)
Hemoglobin: 7.5 g/dL — ABNORMAL LOW (ref 12.0–15.0)
MCH: 26.5 pg (ref 26.0–34.0)
MCHC: 31.1 g/dL (ref 30.0–36.0)
MCV: 85.2 fL (ref 80.0–100.0)
Platelets: 242 10*3/uL (ref 150–400)
RBC: 2.83 MIL/uL — ABNORMAL LOW (ref 3.87–5.11)
RDW: 19.2 % — ABNORMAL HIGH (ref 11.5–15.5)
WBC: 9.5 10*3/uL (ref 4.0–10.5)
nRBC: 0.2 % (ref 0.0–0.2)

## 2021-08-14 MED ORDER — OXYCODONE HCL 5 MG PO TABS: 5.0000 mg | ORAL_TABLET | ORAL | 0 refills | Status: DC | PRN

## 2021-08-14 MED ORDER — CYCLOBENZAPRINE HCL 10 MG PO TABS: 10.0000 mg | ORAL_TABLET | Freq: Three times a day (TID) | ORAL | 0 refills | Status: AC | PRN

## 2021-08-14 MED ORDER — FERROUS SULFATE 325 (65 FE) MG PO TABS: 325.0000 mg | ORAL_TABLET | Freq: Three times a day (TID) | ORAL | 0 refills | Status: DC

## 2021-08-14 MED ORDER — ACETAMINOPHEN 500 MG PO TABS: 1000.0000 mg | ORAL_TABLET | Freq: Four times a day (QID) | ORAL | 0 refills | Status: AC

## 2021-08-14 NOTE — TOC Transition Note (Signed)
Transition of Care [facility]) - CM/SW Discharge Note   Patient Details  Name: Tamara Nunez MRN: [id number] Date of Birth: 1955/12/21  [facility] CM/SW Contact:  Lennart Pall, LCSW Phone Number: 08/14/2021, 10:57 AM   Clinical Narrative:    Met briefly with pt and confirming she has all needed DME at home.  OPPT already arranged with [facility] in [place].  No TOC needs.   Final next level of care: OP Rehab Barriers to Discharge: No Barriers Identified   Patient Goals and CMS Choice Patient states their goals for this hospitalization and ongoing recovery are:: return home      Discharge Placement                       Discharge Plan and Services                DME Arranged: N/A DME Agency: NA                  Social Determinants of Health (SDOH) Interventions     Readmission Risk Interventions    01/31/2019   12:48 PM  Readmission Risk Prevention Plan  Post Dischage Appt Complete  Medication Screening Complete  Transportation Screening Complete

## 2021-08-14 NOTE — Plan of Care (Signed)
  Problem: Pain Management: Goal: Pain level will decrease with appropriate interventions Outcome: Progressing   Problem: Education: Goal: Knowledge of General Education information will improve Description: Including pain rating scale, medication(s)/side effects and non-pharmacologic comfort measures Outcome: Progressing   

## 2021-08-14 NOTE — Progress Notes (Signed)
Physical Therapy Treatment Patient Details Name: Tamara Nunez MRN: [id number] DOB: Dec 13, 1955 Today's Date: 08/14/2021   History of Present Illness Pt is a 66 year old female s/p Right TKA on 08/13/21.  PMHx significant for L TKA 02/26/21, CAD s/p CABGx2, back pain, fibromyalgia, HLD, HTN, DMII, gastric bypass    PT Comments    Pt assisted with ambulating in hallway, practiced safe stair technique and performed exercises.  Pt reports understanding and had no further questions.  Stair and HEP handouts provided.  Pt feels ready for d/c home today.    Recommendations for follow up therapy are one component of a multi-disciplinary discharge planning process, led by the attending physician.  Recommendations may be updated based on patient status, additional functional criteria and insurance authorization.  Follow Up Recommendations  Follow physician's recommendations for discharge plan and follow up therapies     Assistance Recommended at Discharge    Patient can return home with the following     Equipment Recommendations  None recommended by PT    Recommendations for Other Services       Precautions / Restrictions Precautions Precautions: Fall;Knee Restrictions Weight Bearing Restrictions: No Other Position/Activity Restrictions: WBAT     Mobility  Bed Mobility               General bed mobility comments: pt in recliner    Transfers Overall transfer level: Needs assistance Equipment used: Rolling walker (2 wheels) Transfers: Sit to/from Stand Sit to Stand: Min guard           General transfer comment: verbal cues for UE and LE positioning    Ambulation/Gait Ambulation/Gait assistance: Min guard Gait Distance (Feet): 200 Feet Assistive device: Rolling walker (2 wheels) Gait Pattern/deviations: Step-to pattern, Decreased stance time - right Gait velocity: decr     General Gait Details: verbal cues for sequence, posture, RW positioning, distance to  tolerance   Stairs Stairs: Yes Stairs assistance: Min guard Stair Management: Step to pattern, Backwards, With walker Number of Stairs: 3 General stair comments: verbal cues for sequence and RW positioning; pt reports understanding, handout provided; verbal instructions with handout reviewed with spouse   Wheelchair Mobility    Modified Rankin (Stroke Patients Only)       Balance                                            Cognition Arousal/Alertness: Awake/alert Behavior During Therapy: WFL for tasks assessed/performed Overall Cognitive Status: Within Functional Limits for tasks assessed                                          Exercises Total Joint Exercises Ankle Circles/Pumps: AROM, Both, 10 reps Quad Sets: AROM, Both, 10 reps Short Arc Quad: AROM, Right, 10 reps Heel Slides: AAROM, Right, 10 reps Hip ABduction/ADduction: Right, 10 reps, AROM Straight Leg Raises: AAROM, Right, 10 reps    General Comments        Pertinent Vitals/Pain Pain Assessment Pain Assessment: 0-10 Pain Score: 8  Pain Location: right knee Pain Descriptors / Indicators: Sore, Aching Pain Intervention(s): Repositioned, Premedicated before session, Monitored during session    Home Living  Prior Function            PT Goals (current goals can now be found in the care plan section) Progress towards PT goals: Progressing toward goals    Frequency    7X/week      PT Plan Current plan remains appropriate    Co-evaluation              AM-PAC PT "6 Clicks" Mobility   Outcome Measure  Help needed turning from your back to your side while in a flat bed without using bedrails?: A Little Help needed moving from lying on your back to sitting on the side of a flat bed without using bedrails?: A Little Help needed moving to and from a bed to a chair (including a wheelchair)?: A Little Help needed standing up  from a chair using your arms (e.g., wheelchair or bedside chair)?: A Little Help needed to walk in hospital room?: A Little Help needed climbing 3-5 steps with a railing? : A Little 6 Click Score: 18    End of Session Equipment Utilized During Treatment: Gait belt Activity Tolerance: Patient tolerated treatment well Patient left: in chair;with call bell/phone within reach;with chair alarm set Nurse Communication: Mobility status PT Visit Diagnosis: Difficulty in walking, not elsewhere classified (R26.2)     Time: 6578-4696 PT Time Calculation (min) (ACUTE ONLY): 25 min  Charges:  $Gait Training: 8-22 mins $Therapeutic Exercise: 8-22 mins                    Thomasene Mohair PT, DPT Acute Rehabilitation Services Pager: [phone] Office: [phone]    Tamara Nunez 08/14/2021, 1:30 PM

## 2021-08-14 NOTE — Progress Notes (Signed)
Subjective: 1 Day Post-Op Procedure(s) (LRB): TOTAL KNEE ARTHROPLASTY (Right) Patient reports pain as mild.   Patient seen in rounds with Dr. Alvan Dame. Patient is well, and has had no acute complaints or problems. No acute events overnight. Foley catheter removed. Patient ambulated 60 feet with PT. She requests to take her own Brillinta or wait until she gets home today so that she will not be charged for it. We will continue therapy today.   Objective: Vital signs in last 24 hours: Temp:  [98 F (36.7 C)-98.8 F (37.1 C)] 98.2 F (36.8 C) (06/07 0538) Pulse Rate:  [62-96] 75 (06/07 0538) Resp:  [10-22] 18 (06/07 0538) BP: (107-168)/(55-116) 132/61 (06/07 0538) SpO2:  [96 %-100 %] 97 % (06/07 0538) Weight:  [76.2 kg] 76.2 kg (06/06 1541)  Intake/Output from previous day:  Intake/Output Summary (Last 24 hours) at 08/14/2021 0806 Last data filed at 08/14/2021 0800 Gross per 24 hour  Intake 4085.05 ml  Output 1625 ml  Net 2460.05 ml     Intake/Output this shift: Total I/O In: 240 [P.O.:240] Out: 200 [Urine:200]  Labs: Recent Labs    08/14/21 0315  HGB 7.5*   Recent Labs    08/14/21 0315  WBC 9.5  RBC 2.83*  HCT 24.1*  PLT 242   Recent Labs    08/14/21 0315  NA 141  K 3.9  CL 112*  CO2 24  BUN 15  CREATININE 0.88  GLUCOSE 96  CALCIUM 8.8*   No results for input(s): LABPT, INR in the last 72 hours.  Exam: General - Patient is Alert and Oriented Extremity - Neurologically intact Sensation intact distally Intact pulses distally Dorsiflexion/Plantar flexion intact Dressing - dressing C/D/I Motor Function - intact, moving foot and toes well on exam.   Past Medical History:  Diagnosis Date   Arthritis    "in my back" (05/06/2017)   CAD (coronary artery disease)    CABG 2006, 09/2016 DES RCA, 04/2017 DES Circ, 06/2018 DES 1st diag   Chronic back pain    Coronary artery disease    a. s/p CABG in 2006 with LIMA-LAD, SVG-dLAD. b. cath in 2009 showing patent  grafts. c. 09/2016: cath with occluded LAD but patent SVG-dLAD. LIMA was atretic. pRCA with 80% stenosis --> treated with DES   Fibromyalgia    GERD (gastroesophageal reflux disease)    Hyperlipidemia    Hypertension    Type II diabetes mellitus (HCC)     Assessment/Plan: 1 Day Post-Op Procedure(s) (LRB): TOTAL KNEE ARTHROPLASTY (Right) Principal Problem:   S/P total knee arthroplasty, right  Estimated body mass index is 28.84 kg/m as calculated from the following:   Height as of this encounter: 5\' 4"  (1.626 m).   Weight as of this encounter: 76.2 kg. Advance diet Up with therapy D/C IV fluids   Patient's anticipated LOS is less than 2 midnights, meeting these requirements: - Younger than 20 - Lives within 1 hour of care - Has a competent adult at home to recover with post-op recover - NO history of  - Chronic pain requiring opiods  - Diabetes  - Coronary Artery Disease  - Heart failure  - Heart attack  - Stroke  - DVT/VTE  - Cardiac arrhythmia  - Respiratory Failure/COPD  - Renal failure  - Anemia  - Advanced Liver disease     DVT Prophylaxis - Aspirin & Brillinta Weight bearing as tolerated.  ABLA on chronic anemia - Hgb ~10 at baseline, 7.5 this AM.  Plan is  to go Home after hospital stay. Plan for discharge today following 1-2 sessions of PT as long as they are meeting their goals. Patient is scheduled for OPPT. Follow up in the office in 2 weeks.   Griffith Citron, PA-C Orthopedic Surgery [phone] 08/14/2021, 8:06 AM

## 2021-08-14 NOTE — Progress Notes (Signed)
The patient is alert and oriented and has been seen by her physician. The orders for discharge were written. IV has been removed. Went over discharge instructions with patient and family. She is being discharged via wheelchair with all of her belongings.  

## 2021-08-14 NOTE — Plan of Care (Signed)
  Problem: Pain Management: Goal: Pain level will decrease with appropriate interventions Outcome: Progressing   

## 2021-08-15 ENCOUNTER — Encounter (HOSPITAL_COMMUNITY): Payer: Self-pay | Admitting: Orthopedic Surgery

## 2021-08-29 NOTE — Discharge Summary (Cosign Needed)
Patient ID: Tamara Nunez MRN: [id number] DOB/AGE: 67-Oct-1957 67 y.o.  Admit date: 08/13/2021 Discharge date: 08/14/2021  Admission Diagnoses:  Right knee osteoarthritis  Discharge Diagnoses:  Principal Problem:   S/P total knee arthroplasty, right   Past Medical History:  Diagnosis Date   Arthritis    "in my back" (05/06/2017)   CAD (coronary artery disease)    CABG 2006, 09/2016 DES RCA, 04/2017 DES Circ, 06/2018 DES 1st diag   Chronic back pain    Coronary artery disease    a. s/p CABG in 2006 with LIMA-LAD, SVG-dLAD. b. cath in 2009 showing patent grafts. c. 09/2016: cath with occluded LAD but patent SVG-dLAD. LIMA was atretic. pRCA with 80% stenosis --> treated with DES   Fibromyalgia    GERD (gastroesophageal reflux disease)    Hyperlipidemia    Hypertension    Type II diabetes mellitus ([place])     Surgeries: Procedure(s): TOTAL KNEE ARTHROPLASTY on 08/13/2021   Consultants:   Discharged Condition: Improved  Hospital Course: AMABEL STMARIE is an 66 y.o. female who was admitted 08/13/2021 for operative treatment ofS/P total knee arthroplasty, right. Patient has severe unremitting pain that affects sleep, daily activities, and work/hobbies. After pre-op clearance the patient was taken to the operating room on 08/13/2021 and underwent  Procedure(s): TOTAL KNEE ARTHROPLASTY.    Patient was given perioperative antibiotics:  Anti-infectives (From admission, onward)    Start     Dose/Rate Route Frequency Ordered Stop   08/13/21 1330  ceFAZolin (ANCEF) IVPB 2g/100 mL premix        2 g 200 mL/hr over 30 Minutes Intravenous Every 6 hours 08/13/21 0858 08/14/21 0129   08/13/21 0600  ceFAZolin (ANCEF) IVPB 2g/100 mL premix        2 g 200 mL/hr over 30 Minutes Intravenous On call to O.R. 08/13/21 0518 08/13/21 0720        Patient was given sequential compression devices, early ambulation, and chemoprophylaxis to prevent DVT. Patient worked with PT and was meeting their goals  regarding safe ambulation and transfers.  Patient benefited maximally from hospital stay and there were no complications.    Recent vital signs: No data found.   Recent laboratory studies: No results for input(s): "WBC", "HGB", "HCT", "PLT", "NA", "K", "CL", "CO2", "BUN", "CREATININE", "GLUCOSE", "INR", "CALCIUM" in the last 72 hours.  Invalid input(s): "PT", "2"   Discharge Medications:   Allergies as of 08/14/2021       Reactions   Codeine Hives, Shortness Of Breath      Other Anaphylaxis   Nuts   Ciprofloxacin Itching   Valsartan Rash   Adhesive [tape] Itching   "clear tape" used after surgery."took all her skin off".  Paper take is ok   Sulfa Antibiotics Nausea Only        Medication List     STOP taking these medications    diclofenac 75 MG EC tablet Commonly known as: VOLTAREN   HYDROcodone-acetaminophen 7.5-325 MG tablet Commonly known as: NORCO   tiZANidine 2 MG tablet Commonly known as: ZANAFLEX   TYLENOL ARTHRITIS PAIN PO Replaced by: acetaminophen 500 MG tablet       TAKE these medications    Accu-Chek Guide Me w/Device Kit Use to test BG qid   Accu-Chek Guide test strip Generic drug: glucose blood USE TO CHECK BLOOD GLUCOSE DAILY AS DIRECTED   acetaminophen 500 MG tablet Commonly known as: TYLENOL Take 2 tablets (1,000 mg total) by mouth every 6 (six) hours. Replaces:  TYLENOL ARTHRITIS PAIN PO   amLODipine 2.5 MG tablet Commonly known as: NORVASC Take 1 tablet (2.5 mg total) by mouth daily.   aspirin EC 81 MG tablet Take 81 mg by mouth daily.   atorvastatin 40 MG tablet Commonly known as: LIPITOR Take 1 tablet (40 mg total) by mouth daily.   cetirizine 10 MG tablet Commonly known as: ZYRTEC Take 10 mg by mouth daily.   Cholecalciferol 50 MCG (2000 UT) Caps Take 1 capsule (2,000 Units total) by mouth daily with breakfast.   cilostazol 50 MG tablet Commonly known as: PLETAL Take 1 tablet (50 mg total) by mouth 2 (two) times  daily.   Co Q-10 200 MG Caps Take 200 mg by mouth daily.   cyclobenzaprine 10 MG tablet Commonly known as: FLEXERIL Take 1 tablet (10 mg total) by mouth 3 (three) times daily as needed for muscle spasms.   diclofenac sodium 1 % Gel Commonly known as: VOLTAREN Apply 1 application. topically in the morning and at bedtime.   dicyclomine 10 MG capsule Commonly known as: BENTYL Take 10 mg by mouth every 6 (six) hours as needed for spasms.   EPINEPHrine 0.3 mg/0.3 mL Soaj injection Commonly known as: EPI-PEN Inject 0.3 mg into the muscle as needed for anaphylaxis.   ferrous sulfate 325 (65 FE) MG tablet Take 1 tablet (325 mg total) by mouth 3 (three) times daily after meals for 14 days. What changed: when to take this   Fingerstix Lancets Misc Inject into the skin.   Lancets Misc Lancet Softclix. Use to test BG bid. E11.65   fluticasone 50 MCG/ACT nasal spray Commonly known as: FLONASE Place 1 spray into both nostrils 2 (two) times daily.   furosemide 40 MG tablet Commonly known as: LASIX Take 40 mg by mouth daily as needed for fluid or edema.   gabapentin 600 MG tablet Commonly known as: NEURONTIN Take 600 mg by mouth in the morning, at noon, in the evening, and at bedtime.   isosorbide mononitrate 30 MG 24 hr tablet Commonly known as: IMDUR Take 1 tablet (30 mg total) by mouth daily.   lidocaine 5 % Commonly known as: Lidoderm Place 1 patch onto the skin daily as needed. Remove & Discard patch within 12 hours or as directed by MD What changed:  how much to take when to take this additional instructions   metoprolol tartrate 50 MG tablet Commonly known as: LOPRESSOR Take 1 tablet (50 mg total) by mouth 2 (two) times daily.   multivitamin with minerals Tabs tablet Take 2 tablets by mouth daily.   nitroGLYCERIN 0.4 MG SL tablet Commonly known as: NITROSTAT Place 1 tablet (0.4 mg total) under the tongue every 5 (five) minutes as needed for chest pain (up to 3  doses). What changed: reasons to take this   oxyCODONE 5 MG immediate release tablet Commonly known as: Oxy IR/ROXICODONE Take 1-2 tablets (5-10 mg total) by mouth every 4 (four) hours as needed for moderate pain or severe pain. Start with 1 tablet every 4 hours as needed for pain. Take 2 tablets only for severe pain.   pantoprazole 40 MG tablet Commonly known as: PROTONIX Take 40 mg by mouth 2 (two) times daily as needed (acid reflux).   polyethylene glycol 17 g packet Commonly known as: MIRALAX / GLYCOLAX Take 17 g by mouth daily as needed for mild constipation.   ranolazine 1000 MG SR tablet Commonly known as: Ranexa Take 1 tablet (1,000 mg total) by mouth 2 (  two) times daily.   STOOL SOFTENER PO Take 1 capsule by mouth daily.   ticagrelor 60 MG Tabs tablet Commonly known as: BRILINTA Take 1 tablet (60 mg total) by mouth 2 (two) times daily.   VITAMIN B-12 PO Take 3 tablets by mouth daily.   vitamin E 180 MG (400 UNITS) capsule Take 400 Units by mouth daily.   VITRON-C PO Take 1 tablet by mouth daily.               Discharge Care Instructions  (From admission, onward)           Start     Ordered   08/14/21 0000  Change dressing       Comments: Maintain surgical dressing until follow up in the clinic. If the edges start to pull up, may reinforce with tape. If the dressing is no longer working, may remove and cover with gauze and tape, but must keep the area dry and clean.  Call with any questions or concerns.   08/14/21 0811            Diagnostic Studies: No results found.  Disposition: Discharge disposition: 01-Home or Self Care       Discharge Instructions     Call MD / Call 911   Complete by: As directed    If you experience chest pain or shortness of breath, CALL 911 and be transported to the hospital emergency room.  If you develope a fever above 101 F, pus (white drainage) or increased drainage or redness at the wound, or calf pain,  call your surgeon's office.   Change dressing   Complete by: As directed    Maintain surgical dressing until follow up in the clinic. If the edges start to pull up, may reinforce with tape. If the dressing is no longer working, may remove and cover with gauze and tape, but must keep the area dry and clean.  Call with any questions or concerns.   Constipation Prevention   Complete by: As directed    Drink plenty of fluids.  Prune juice may be helpful.  You may use a stool softener, such as Colace (over the counter) 100 mg twice a day.  Use MiraLax (over the counter) for constipation as needed.   Diet - low sodium heart healthy   Complete by: As directed    Increase activity slowly as tolerated   Complete by: As directed    Weight bearing as tolerated with assist device (walker, cane, etc) as directed, use it as long as suggested by your surgeon or therapist, typically at least 4-6 weeks.   Post-operative opioid taper instructions:   Complete by: As directed    POST-OPERATIVE OPIOID TAPER INSTRUCTIONS: It is important to wean off of your opioid medication as soon as possible. If you do not need pain medication after your surgery it is ok to stop day one. Opioids include: Codeine, Hydrocodone(Norco, Vicodin), Oxycodone(Percocet, oxycontin) and hydromorphone amongst others.  Long term and even short term use of opiods can cause: Increased pain response Dependence Constipation Depression Respiratory depression And more.  Withdrawal symptoms can include Flu like symptoms Nausea, vomiting And more Techniques to manage these symptoms Hydrate well Eat regular healthy meals Stay active Use relaxation techniques(deep breathing, meditating, yoga) Do Not substitute Alcohol to help with tapering If you have been on opioids for less than two weeks and do not have pain than it is ok to stop all together.  Plan to wean off of  opioids This plan should start within one week post op of your joint  replacement. Maintain the same interval or time between taking each dose and first decrease the dose.  Cut the total daily intake of opioids by one tablet each day Next start to increase the time between doses. The last dose that should be eliminated is the evening dose.      TED hose   Complete by: As directed    Use stockings (TED hose) for 2 weeks on both leg(s).  You may remove them at night for sleeping.        Follow-up Information     Paralee Cancel, MD. Schedule an appointment as soon as possible for a visit in 2 week(s).   Specialty: Orthopedic Surgery Contact information: [address] [place] [address] [phone]                  Signed: Irving Copas 08/29/2021, 10:46 AM

## 2021-09-06 ENCOUNTER — Other Ambulatory Visit: Payer: Self-pay | Admitting: "Endocrinology

## 2021-10-20 ENCOUNTER — Encounter (HOSPITAL_COMMUNITY): Payer: Self-pay | Admitting: Emergency Medicine

## 2021-10-20 ENCOUNTER — Emergency Department (HOSPITAL_COMMUNITY)
Admission: EM | Admit: 2021-10-20 | Discharge: 2021-10-21 | Disposition: A | Discharge status: Home / Self Care | Payer: Medicare Other | Attending: Emergency Medicine | Admitting: Emergency Medicine

## 2021-10-20 DIAGNOSIS — M48061 Spinal stenosis, lumbar region without neurogenic claudication: Secondary | ICD-10-CM | POA: Diagnosis not present

## 2021-10-20 DIAGNOSIS — I2510 Atherosclerotic heart disease of native coronary artery without angina pectoris: Secondary | ICD-10-CM | POA: Diagnosis not present

## 2021-10-20 DIAGNOSIS — M5116 Intervertebral disc disorders with radiculopathy, lumbar region: Secondary | ICD-10-CM | POA: Diagnosis not present

## 2021-10-20 DIAGNOSIS — Z79899 Other long term (current) drug therapy: Secondary | ICD-10-CM | POA: Insufficient documentation

## 2021-10-20 DIAGNOSIS — Z7982 Long term (current) use of aspirin: Secondary | ICD-10-CM | POA: Insufficient documentation

## 2021-10-20 DIAGNOSIS — I10 Essential (primary) hypertension: Secondary | ICD-10-CM | POA: Insufficient documentation

## 2021-10-20 DIAGNOSIS — E119 Type 2 diabetes mellitus without complications: Secondary | ICD-10-CM | POA: Diagnosis not present

## 2021-10-20 DIAGNOSIS — M5441 Lumbago with sciatica, right side: Secondary | ICD-10-CM

## 2021-10-20 DIAGNOSIS — Z96653 Presence of artificial knee joint, bilateral: Secondary | ICD-10-CM | POA: Diagnosis not present

## 2021-10-20 DIAGNOSIS — M545 Low back pain, unspecified: Secondary | ICD-10-CM | POA: Diagnosis present

## 2021-10-20 MED ORDER — PREDNISONE 20 MG PO TABS
60.0000 mg | ORAL_TABLET | Freq: Once | ORAL | Status: AC
  Administered 2021-10-21: 60 mg via ORAL
  Filled 2021-10-20: qty 3

## 2021-10-20 MED ORDER — OXYCODONE-ACETAMINOPHEN 5-325 MG PO TABS
1.0000 | ORAL_TABLET | Freq: Once | ORAL | Status: AC
  Administered 2021-10-21: 1 via ORAL
  Filled 2021-10-20: qty 1

## 2021-10-20 MED ORDER — METHOCARBAMOL 500 MG PO TABS
500.0000 mg | ORAL_TABLET | Freq: Once | ORAL | Status: AC
  Administered 2021-10-21: 500 mg via ORAL
  Filled 2021-10-20: qty 1

## 2021-10-20 NOTE — ED Provider Notes (Signed)
[facility] EMERGENCY DEPARTMENT Provider Note   CSN: [id number] Arrival date & time: 10/20/21  1905     History  Chief Complaint  Patient presents with   Back Pain    Tamara Nunez is a 66 y.o. female.  Patient with history of CAD status post stents, hypertension, diabetes, chronic back pain and fibromyalgia here with ongoing right-sided low back pain for the past 4 weeks.  She denies any fall or trauma.  Reports having surgery remotely by Dr. Vertell Limber denies any new trauma or fall.  The pain is in her right low back and radiates down her right leg but does not go past her knee.  She was seen for this pain 2 days ago at [facility] and had a CT scan that showed no acute pathology.  She was given a prescription for hydrocodone and naproxen but she is taken these without relief.  Comes in today because the pain is serious and ongoing and she is unable to tolerate it at home.  Reports the pain starts in her right low back and rates down her buttock to her right leg but does not go past the knee.  No increased numbness or tingling.  Does feel somewhat weak in the leg.  No fever, chills, nausea or vomiting.  Has noticed increased frequency of urination but no pain or dysuria.  Denies any bowel or bladder incontinence, contrary to triage note.  Denies any fever.  Denies any vomiting.  No chest pain or shortness of breath.  Reports she does not usually have back pain like this.  Comes in tonight because the pain is intractable.  The history is provided by the patient.  Back Pain      Home Medications Prior to Admission medications   Medication Sig Start Date End Date Taking? Authorizing Provider  Accu-Chek Softclix Lancets lancets USE TO TEST BLOOD GLUCOSE TWICE  DAILY 09/06/21   Cassandria Anger, MD  acetaminophen (TYLENOL) 500 MG tablet Take 2 tablets (1,000 mg total) by mouth every 6 (six) hours. 08/14/21   Irving Copas, PA-C  amLODipine (NORVASC) 2.5 MG tablet  Take 1 tablet (2.5 mg total) by mouth daily. 08/26/19 08/13/21  Almyra Deforest, PA  aspirin EC 81 MG tablet Take 81 mg by mouth daily.    [provider]  atorvastatin (LIPITOR) 40 MG tablet Take 1 tablet (40 mg total) by mouth daily. 08/17/19   Dunn, Nedra Hai, PA-C  Blood Glucose Monitoring Suppl (ACCU-CHEK GUIDE ME) w/Device KIT Use to test BG qid 08/06/21   Cassandria Anger, MD  cetirizine (ZYRTEC) 10 MG tablet Take 10 mg by mouth daily.    [provider]  Cholecalciferol 50 MCG (2000 UT) CAPS Take 1 capsule (2,000 Units total) by mouth daily with breakfast. 01/17/21   Nida, Marella Chimes, MD  cilostazol (PLETAL) 50 MG tablet Take 1 tablet (50 mg total) by mouth 2 (two) times daily. Patient not taking: Reported on 08/02/2021 07/04/20   Lorretta Harp, MD  Coenzyme Q10 (CO Q-10) 200 MG CAPS Take 200 mg by mouth daily.    [provider]  Cyanocobalamin (VITAMIN B-12 PO) Take 3 tablets by mouth daily.    [provider]  cyclobenzaprine (FLEXERIL) 10 MG tablet Take 1 tablet (10 mg total) by mouth 3 (three) times daily as needed for muscle spasms. 08/14/21   Irving Copas, PA-C  diclofenac sodium (VOLTAREN) 1 % GEL Apply 1 application. topically in the morning  and at bedtime. 10/31/18   [provider]  dicyclomine (BENTYL) 10 MG capsule Take 10 mg by mouth every 6 (six) hours as needed for spasms. 01/06/20   [provider]  Docusate Calcium (STOOL SOFTENER PO) Take 1 capsule by mouth daily.    [provider]  EPINEPHrine 0.3 mg/0.3 mL IJ SOAJ injection Inject 0.3 mg into the muscle as needed for anaphylaxis. 05/20/21   [provider]  ferrous sulfate 325 (65 FE) MG tablet Take 1 tablet (325 mg total) by mouth 3 (three) times daily after meals for 14 days. 08/14/21 08/28/21  Irving Copas, PA-C  Fingerstix Lancets MISC Inject into the skin. 03/23/19   [provider]  fluticasone (FLONASE) 50 MCG/ACT nasal spray Place 1  spray into both nostrils 2 (two) times daily.    [provider]  furosemide (LASIX) 40 MG tablet Take 40 mg by mouth daily as needed for fluid or edema.    [provider]  gabapentin (NEURONTIN) 600 MG tablet Take 600 mg by mouth in the morning, at noon, in the evening, and at bedtime. 09/07/17   [provider]  glucose blood (ACCU-CHEK GUIDE) test strip USE TO CHECK BLOOD GLUCOSE DAILY AS DIRECTED 06/18/21   Nida, Marella Chimes, MD  Iron-Vitamin C (VITRON-C PO) Take 1 tablet by mouth daily.    [provider]  isosorbide mononitrate (IMDUR) 30 MG 24 hr tablet Take 1 tablet (30 mg total) by mouth daily. 12/04/20   Almyra Deforest, PA  lidocaine (LIDODERM) 5 % Place 1 patch onto the skin daily as needed. Remove & Discard patch within 12 hours or as directed by MD Patient taking differently: Place 2 patches onto the skin See admin instructions. Put 2 patches on back 1-2 times a day as needed for pain 03/04/21   Wynona Dove A, DO  metoprolol tartrate (LOPRESSOR) 50 MG tablet Take 1 tablet (50 mg total) by mouth 2 (two) times daily. 06/13/20   Almyra Deforest, PA  Multiple Vitamin (MULTIVITAMIN WITH MINERALS) TABS tablet Take 2 tablets by mouth daily.    [provider]  nitroGLYCERIN (NITROSTAT) 0.4 MG SL tablet Place 1 tablet (0.4 mg total) under the tongue every 5 (five) minutes as needed for chest pain (up to 3 doses). Patient taking differently: Place 0.4 mg under the tongue every 5 (five) minutes as needed for chest pain. 08/17/19   Dunn, Nedra Hai, PA-C  oxyCODONE (OXY IR/ROXICODONE) 5 MG immediate release tablet Take 1-2 tablets (5-10 mg total) by mouth every 4 (four) hours as needed for moderate pain or severe pain. Start with 1 tablet every 4 hours as needed for pain. Take 2 tablets only for severe pain. 08/14/21   Irving Copas, PA-C  pantoprazole (PROTONIX) 40 MG tablet Take 40 mg by mouth 2 (two) times daily as needed (acid reflux).    [provider]   polyethylene glycol (MIRALAX / GLYCOLAX) 17 g packet Take 17 g by mouth daily as needed for mild constipation. 02/27/21   Irving Copas, PA-C  ranolazine (RANEXA) 1000 MG SR tablet Take 1 tablet (1,000 mg total) by mouth 2 (two) times daily. 06/16/13   Hilty, Nadean Corwin, MD  ticagrelor (BRILINTA) 60 MG TABS tablet Take 1 tablet (60 mg total) by mouth 2 (two) times daily. 04/08/21   Hilty, Nadean Corwin, MD  vitamin E 400 UNIT capsule Take 400 Units by mouth daily.    [provider]  Allergies    Codeine, Other, Ciprofloxacin, Valsartan, Adhesive [tape], and Sulfa antibiotics    Review of Systems   Review of Systems  Constitutional:  Negative for activity change and appetite change.  Respiratory:  Negative for chest tightness.   Musculoskeletal:  Positive for back pain.   all other systems are negative except as noted in the HPI and PMH.    Physical Exam Updated Vital Signs BP (!) 127/58 (BP Location: Right Arm)   Pulse 74   Temp 98.5 F (36.9 C) (Oral)   Resp 19   SpO2 100%  Physical Exam Vitals and nursing note reviewed.  Constitutional:      General: She is not in acute distress.    Appearance: She is well-developed.  HENT:     Head: Normocephalic and atraumatic.     Mouth/Throat:     Pharynx: No oropharyngeal exudate.  Eyes:     Conjunctiva/sclera: Conjunctivae normal.     Pupils: Pupils are equal, round, and reactive to light.  Neck:     Comments: No meningismus. Cardiovascular:     Rate and Rhythm: Normal rate and regular rhythm.     Heart sounds: Normal heart sounds. No murmur heard. Pulmonary:     Effort: Pulmonary effort is normal. No respiratory distress.     Breath sounds: Normal breath sounds.  Chest:     Chest wall: No tenderness.  Abdominal:     Palpations: Abdomen is soft.     Tenderness: There is no abdominal tenderness. There is no guarding or rebound.  Musculoskeletal:        General: Tenderness present. Normal range of motion.      Cervical back: Normal range of motion and neck supple.     Comments: Right paraspinal lumbar tenderness, no midline tenderness  5/5 strength in bilateral lower extremities. Ankle plantar and dorsiflexion intact. Great toe extension intact bilaterally. +2 DP and PT pulses.  Unable to elicit patellar reflexes bilaterally.  Status post bilateral knee replacements.  Skin:    General: Skin is warm.  Neurological:     Mental Status: She is alert and oriented to person, place, and time.     Cranial Nerves: No cranial nerve deficit.     Motor: No abnormal muscle tone.     Coordination: Coordination normal.     Comments:  5/5 strength throughout. CN 2-12 intact.Equal grip strength.   Psychiatric:        Behavior: Behavior normal.     ED Results / Procedures / Treatments   Labs (all labs ordered are listed, but only abnormal results are displayed) Labs Reviewed  URINALYSIS, ROUTINE W REFLEX MICROSCOPIC - Abnormal; Notable for the following components:      Result Value   APPearance CLOUDY (*)    Leukocytes,Ua LARGE (*)    WBC, UA >50 (*)    All other components within normal limits  CBC WITH DIFFERENTIAL/PLATELET - Abnormal; Notable for the following components:   Hemoglobin 9.9 (*)    HCT 33.1 (*)    MCH 24.4 (*)    MCHC 29.9 (*)    RDW 17.0 (*)    All other components within normal limits  COMPREHENSIVE METABOLIC PANEL - Abnormal; Notable for the following components:   Calcium 8.8 (*)    Albumin 3.4 (*)    All other components within normal limits  URINE CULTURE    EKG None  Radiology MR LUMBAR SPINE WO CONTRAST  Result Date: 10/21/2021 CLINICAL DATA:  Initial evaluation for  right-sided lower back pain. EXAM: MRI LUMBAR SPINE WITHOUT CONTRAST TECHNIQUE: Multiplanar, multisequence MR imaging of the lumbar spine was performed. No intravenous contrast was administered. COMPARISON:  Prior MRI from 11/24/2018. FINDINGS: Segmentation: Standard. Lowest well-formed disc space labeled  the L5-S1 level. Alignment: 3 mm anterolisthesis of L2 on L3, with 4 mm anterolisthesis of L3 on L4, similar to prior. Trace 3 mm retrolisthesis of T11 on T12, also similar. Vertebrae: Postoperative changes from prior posterior fusion at L2 through L5. Vertebral body height maintained without acute or chronic fracture. Bone marrow signal intensity diffusely heterogeneous without worrisome osseous lesion. Discogenic reactive endplate change present about the T11-12 interspace. Conus medullaris and cauda equina: Conus extends to the T12-L1 level. Conus and cauda equina appear normal. Paraspinal and other soft tissues: Extensive postoperative changes present within the posterior paraspinous soft tissues. Small benign postoperative collection noted at the laminectomy defect at L2-3. Paraspinous soft tissues demonstrate no other acute finding. Disc levels: T11-12: Seen only on sagittal projection. Trace retrolisthesis with degenerative intervertebral disc space narrowing. Diffuse disc bulge with reactive endplate change. Superimposed small left subarticular disc extrusion with inferior migration. Bilateral facet hypertrophy. No significant spinal stenosis. Moderate bilateral foraminal narrowing. T12-L1: Negative interspace. Mild to moderate facet hypertrophy. No significant stenosis. L1-2: Disc desiccation with mild disc bulge. Moderate facet and ligament flavum hypertrophy. Resultant mild spinal stenosis. Foramina remain patent. L2-3: Prior PLIF. No residual spinal stenosis. Foramina appear patent. L3-4: Degenerative intervertebral disc space narrowing with diffuse disc bulge, asymmetric to the left. Prior posterior fusion with posterior decompression. Broad-based left foraminal disc protrusion with annular fissure closely approximates the exiting left L3 nerve root (series 12, image 24). No significant spinal stenosis. Foramina remain patent. L4-5: Prior PLIF. No residual spinal stenosis. Foramina remain patent. L5-S1:  Advanced degenerative intervertebral disc space narrowing with ankylosis. Associated mild reactive endplate spurring. Prior posterior decompression. Residual moderate facet hypertrophy. No significant spinal stenosis. Mild bilateral L5 foraminal narrowing. IMPRESSION: 1. No acute abnormality within the lumbar spine. 2. Postoperative changes from prior PLIF at L2 through L5 without residual or recurrent spinal stenosis. 3. Adjacent segment disease at L1-2 with resultant mild spinal stenosis. 4. Broad-based left foraminal disc protrusion at L3-4, closely approximating and potentially irritating the exiting left L3 nerve root. 5. Multifactorial degenerative changes at T11-12 with resultant moderate bilateral foraminal stenosis. Electronically Signed   By: Jeannine Boga M.D.   On: 10/21/2021 02:25    Procedures Procedures    Medications Ordered in ED Medications  oxyCODONE-acetaminophen (PERCOCET/ROXICET) 5-325 MG per tablet 1 tablet (has no administration in time range)  predniSONE (DELTASONE) tablet 60 mg (has no administration in time range)  methocarbamol (ROBAXIN) tablet 500 mg (has no administration in time range)    ED Course/ Medical Decision Making/ A&P                           Medical Decision Making Amount and/or Complexity of Data Reviewed Labs: ordered. Decision-making details documented in ED Course. Radiology: ordered and independent interpretation performed. Decision-making details documented in ED Course. ECG/medicine tests: ordered and independent interpretation performed. Decision-making details documented in ED Course.  Risk Prescription drug management.  Acute on chronic right-sided low back pain with increased weakness and numbness to her right leg.  She has intact strength on exam with intact distal pulses.  Contrary to triage note she denies any incontinence. Recent ED visit reviewed for [facility].  She did have a CT  scan which showed no acute  pathology.  MRI shows no acute pathology.  Stable postsurgical hardware.  Mild spinal stenosis at L1/L2 Disc protrusion on the left at L3/L4.  She denies any left radicular symptoms states all her symptoms are on the right.  No evidence of cord compression or cauda equina.  Patient able to ambulate without assistance. Does have possible UTI as well which may be contributing to her pain. Urine culture sent.   We will treat with steroids and muscle relaxers and have her follow-up with her spinal surgeon.  Return to the ED with worsening pain, fever, vomiting, unilateral weakness, numbness, tingling, bowel or bladder incontinence or other concerns.        Final Clinical Impression(s) / ED Diagnoses Final diagnoses:  Acute right-sided low back pain with right-sided sciatica    Rx / DC Orders ED Discharge Orders     None         Rhylynn Perdomo, Annie Main, MD 10/21/21 [phone]

## 2021-10-20 NOTE — ED Provider Triage Note (Signed)
Emergency Medicine Provider Triage Evaluation Note  Tamara Nunez , a 66 y.o. female  was evaluated in triage.  Pt complains of continued right-sided lower back pain over the past few weeks.  Was seen in the ED 2 days ago, had CT abdomen pelvis imaging, work-up was negative.  Patient states she still does not know what is wrong.  Pain radiating from the right lower back into her right thigh.  Denies urinary and bowel incontinence.  States increased urinary frequency and sensation to urinate.  Denies saddle anesthesia.  No recent fevers or hemoptysis.  Hx of lumbar fusion.  Review of Systems  Positive: See above Negative:   Physical Exam  BP (!) 125/54   Pulse 72   Temp 98.7 F (37.1 C) (Oral)   Resp 16   SpO2 99%  Gen:   Awake, no distress   Resp:  Normal effort  MSK:   Moves extremities with some difficulty Other:  TTP over right lower lumbar.  Lower extremities appear neurovascularly intact.  No midline lumbar tenderness.  No saddle anesthesia.  Medical Decision Making  Medically screening exam initiated at 7:22 PM.  Appropriate orders placed.  JOVANNI ECKHART was informed that the remainder of the evaluation will be completed by another provider, this initial triage assessment does not replace that evaluation, and the importance of remaining in the ED until their evaluation is complete.     Cecil Cobbs, PA-C 10/20/21 1930

## 2021-10-20 NOTE — ED Triage Notes (Signed)
Patient complains of right lower back pain radiating into her right leg that started four weeks ago. Patient states she has been seen multiple times for same with no resolution. Patient reports new urinary incontinence.

## 2021-10-21 ENCOUNTER — Encounter (HOSPITAL_COMMUNITY): Payer: Self-pay | Admitting: Emergency Medicine

## 2021-10-21 ENCOUNTER — Ambulatory Visit: Payer: Medicare Other | Admitting: "Endocrinology

## 2021-10-21 ENCOUNTER — Encounter: Payer: Self-pay | Admitting: "Endocrinology

## 2021-10-21 ENCOUNTER — Emergency Department (HOSPITAL_COMMUNITY): Payer: Medicare Other

## 2021-10-21 ENCOUNTER — Other Ambulatory Visit: Payer: Self-pay

## 2021-10-21 VITALS — BP 132/78 | HR 84 | Ht 64.0 in | Wt 172.8 lb

## 2021-10-21 DIAGNOSIS — I10 Essential (primary) hypertension: Secondary | ICD-10-CM | POA: Diagnosis not present

## 2021-10-21 DIAGNOSIS — E559 Vitamin D deficiency, unspecified: Secondary | ICD-10-CM

## 2021-10-21 DIAGNOSIS — E782 Mixed hyperlipidemia: Secondary | ICD-10-CM | POA: Diagnosis not present

## 2021-10-21 DIAGNOSIS — E1159 Type 2 diabetes mellitus with other circulatory complications: Secondary | ICD-10-CM | POA: Diagnosis not present

## 2021-10-21 LAB — CBC WITH DIFFERENTIAL/PLATELET
Abs Immature Granulocytes: 0.01 10*3/uL (ref 0.00–0.07)
Basophils Absolute: 0 10*3/uL (ref 0.0–0.1)
Basophils Relative: 0 %
Eosinophils Absolute: 0.5 10*3/uL (ref 0.0–0.5)
Eosinophils Relative: 11 %
HCT: 33.1 % — ABNORMAL LOW (ref 36.0–46.0)
Hemoglobin: 9.9 g/dL — ABNORMAL LOW (ref 12.0–15.0)
Immature Granulocytes: 0 %
Lymphocytes Relative: 33 %
Lymphs Abs: 1.5 10*3/uL (ref 0.7–4.0)
MCH: 24.4 pg — ABNORMAL LOW (ref 26.0–34.0)
MCHC: 29.9 g/dL — ABNORMAL LOW (ref 30.0–36.0)
MCV: 81.5 fL (ref 80.0–100.0)
Monocytes Absolute: 0.7 10*3/uL (ref 0.1–1.0)
Monocytes Relative: 15 %
Neutro Abs: 1.8 10*3/uL (ref 1.7–7.7)
Neutrophils Relative %: 41 %
Platelets: 350 10*3/uL (ref 150–400)
RBC: 4.06 MIL/uL (ref 3.87–5.11)
RDW: 17 % — ABNORMAL HIGH (ref 11.5–15.5)
WBC: 4.5 10*3/uL (ref 4.0–10.5)
nRBC: 0 % (ref 0.0–0.2)

## 2021-10-21 LAB — URINALYSIS, ROUTINE W REFLEX MICROSCOPIC
Bacteria, UA: NONE SEEN
Bilirubin Urine: NEGATIVE
Glucose, UA: NEGATIVE mg/dL
Hgb urine dipstick: NEGATIVE
Ketones, ur: NEGATIVE mg/dL
Nitrite: NEGATIVE
Protein, ur: NEGATIVE mg/dL
Specific Gravity, Urine: 1.018 (ref 1.005–1.030)
WBC, UA: >50 WBC/hpf — ABNORMAL HIGH (ref 0–5)
pH: 5 (ref 5.0–8.0)

## 2021-10-21 LAB — COMPREHENSIVE METABOLIC PANEL
ALT: 27 U/L (ref 0–44)
AST: 31 U/L (ref 15–41)
Albumin: 3.4 g/dL — ABNORMAL LOW (ref 3.5–5.0)
Alkaline Phosphatase: 112 U/L (ref 38–126)
Anion gap: 6 (ref 5–15)
BUN: 12 mg/dL (ref 8–23)
CO2: 25 mmol/L (ref 22–32)
Calcium: 8.8 mg/dL — ABNORMAL LOW (ref 8.9–10.3)
Chloride: 110 mmol/L (ref 98–111)
Creatinine, Ser: 0.91 mg/dL (ref 0.44–1.00)
GFR, Estimated: >60 mL/min (ref >60)
Glucose, Bld: 73 mg/dL (ref 70–99)
Potassium: 3.9 mmol/L (ref 3.5–5.1)
Sodium: 141 mmol/L (ref 135–145)
Total Bilirubin: 0.4 mg/dL (ref 0.3–1.2)
Total Protein: 6.7 g/dL (ref 6.5–8.1)

## 2021-10-21 LAB — POCT GLYCOSYLATED HEMOGLOBIN (HGB A1C): HbA1c, POC (controlled diabetic range): 5.5 % (ref 0.0–7.0)

## 2021-10-21 MED ORDER — CHOLECALCIFEROL 50 MCG (2000 UT) PO CAPS
1.0000 | ORAL_CAPSULE | Freq: Every day | ORAL | 1 refills | Status: DC
Start: 2021-10-21 — End: 2023-10-13

## 2021-10-21 MED ORDER — CEPHALEXIN 500 MG PO CAPS: 500.0000 mg | ORAL_CAPSULE | Freq: Three times a day (TID) | ORAL | 0 refills | Status: DC

## 2021-10-21 MED ORDER — METHOCARBAMOL 500 MG PO TABS: 500.0000 mg | ORAL_TABLET | Freq: Three times a day (TID) | ORAL | 0 refills | Status: DC | PRN

## 2021-10-21 MED ORDER — METHYLPREDNISOLONE 4 MG PO TBPK: ORAL_TABLET | ORAL | 0 refills | Status: DC

## 2021-10-21 NOTE — Progress Notes (Signed)
10/21/2021, 2:50 PM            Endocrinology follow-up note  Subjective:    Patient ID: Tamara Nunez, female    DOB: [birth year].  Tamara Nunez is being seen in follow-up for type 2 diabetes which is currently controlled on diet and exercise with A1c of 5.5% improving from 6.3%.    PMD:   Galen Manila, MD.   Past Medical History:  Diagnosis Date   Arthritis    "in my back" (05/06/2017)   CAD (coronary artery disease)    CABG 2006, 09/2016 DES RCA, 04/2017 DES Circ, 06/2018 DES 1st diag   Chronic back pain    Coronary artery disease    a. s/p CABG in 2006 with LIMA-LAD, SVG-dLAD. b. cath in 2009 showing patent grafts. c. 09/2016: cath with occluded LAD but patent SVG-dLAD. LIMA was atretic. pRCA with 80% stenosis --> treated with DES   Fibromyalgia    GERD (gastroesophageal reflux disease)    Hyperlipidemia    Hypertension    Type II diabetes mellitus ([place])     Past Surgical History:  Procedure Laterality Date   ABDOMINAL HERNIA REPAIR  2018   BACK SURGERY     CARDIAC CATHETERIZATION  01/13/2008   COLONOSCOPY     CORONARY ANGIOPLASTY WITH STENT PLACEMENT  09/26/2016   Prox RCA lesion, 80 %stenosed. DES    CORONARY ARTERY BYPASS GRAFT  12/23/2004   2 vessel - LIMA to LAD (atretic), SVG to distal LAD   CORONARY ATHERECTOMY N/A 08/16/2019   Procedure: CORONARY ATHERECTOMY;  Surgeon: Martinique, Peter M, MD;  Location: [facility];  Service: Cardiovascular;  Laterality: N/A;   CORONARY STENT INTERVENTION N/A 09/26/2016   Procedure: Coronary Stent Intervention;  Surgeon: Leonie Man, MD;  Location: [facility];  Service: Cardiovascular;  Laterality: N/A;   CORONARY STENT INTERVENTION N/A 05/06/2017   Procedure: CORONARY STENT INTERVENTION;  Surgeon: Burnell Blanks, MD;  Location: [facility];  Service: Cardiovascular;  Laterality: N/A;   CORONARY STENT  INTERVENTION N/A 06/25/2018   Procedure: CORONARY STENT INTERVENTION;  Surgeon: Martinique, Peter M, MD;  Location: [facility];  Service: Cardiovascular;  Laterality: N/A;   CORONARY STENT INTERVENTION N/A 08/12/2019   Procedure: CORONARY STENT INTERVENTION;  Surgeon: Belva Crome, MD;  Location: [facility];  Service: Cardiovascular;  Laterality: N/A;   CORONARY STENT INTERVENTION N/A 08/16/2019   Procedure: CORONARY STENT INTERVENTION;  Surgeon: Martinique, Peter M, MD;  Location: [facility];  Service: Cardiovascular;  Laterality: N/A;   DOPPLER ECHOCARDIOGRAPHY  04/12/2008   EF 60%   GASTRIC BYPASS  2009   HERNIA REPAIR     LEFT HEART CATH AND CORONARY ANGIOGRAPHY N/A 05/06/2017   Procedure: LEFT HEART CATH AND CORONARY ANGIOGRAPHY;  Surgeon: Burnell Blanks, MD;  Location: [facility];  Service: Cardiovascular;  Laterality: N/A;   LEFT HEART CATH AND CORS/GRAFTS ANGIOGRAPHY N/A 09/26/2016   Procedure: Left Heart Cath and Cors/Grafts Angiography;  Surgeon: Leonie Man, MD;  Location: [facility];  Service: Cardiovascular;  Laterality: N/A;   LEFT HEART CATH AND CORS/GRAFTS ANGIOGRAPHY N/A 06/25/2018   Procedure: LEFT HEART CATH AND CORS/GRAFTS ANGIOGRAPHY;  Surgeon: Martinique, Peter M, MD;  Location: [facility];  Service: Cardiovascular;  Laterality: N/A;   LEFT HEART CATH AND CORS/GRAFTS ANGIOGRAPHY N/A 08/12/2019   Procedure: LEFT HEART CATH AND CORS/GRAFTS ANGIOGRAPHY;  Surgeon: Belva Crome, MD;  Location: [facility];  Service: Cardiovascular;  Laterality: N/A;   LEFT HEART CATH AND CORS/GRAFTS ANGIOGRAPHY N/A 03/08/2020   Procedure: LEFT HEART CATH AND CORS/GRAFTS ANGIOGRAPHY;  Surgeon: Martinique, Peter M, MD;  Location: [facility];  Service: Cardiovascular;  Laterality: N/A;   NM MYOVIEW LTD  06/03/2012   EF 71%   POSTERIOR FUSION LUMBAR SPINE  1990s   TOTAL KNEE ARTHROPLASTY Left 02/26/2021   Procedure: TOTAL KNEE ARTHROPLASTY;   Surgeon: Paralee Cancel, MD;  Location: [facility];  Service: Orthopedics;  Laterality: Left;   TOTAL KNEE ARTHROPLASTY Right 08/13/2021   Procedure: TOTAL KNEE ARTHROPLASTY;  Surgeon: Paralee Cancel, MD;  Location: [facility];  Service: Orthopedics;  Laterality: Right;   TUBAL LIGATION     VAGINAL HYSTERECTOMY     "partial"    Social History   Socioeconomic History   Marital status: Married    Spouse name: Not on file   Number of children: Not on file   Years of education: Not on file   Highest education level: Not on file  Occupational History   Not on file  Tobacco Use   Smoking status: Never   Smokeless tobacco: Never  Vaping Use   Vaping Use: Never used  Substance and Sexual Activity   Alcohol use: No   Drug use: No   Sexual activity: Not on file  Other Topics Concern   Not on file  Social History Narrative   Not on file   Social Determinants of Health   Financial Resource Strain: Not on file  Food Insecurity: Not on file  Transportation Needs: Not on file  Physical Activity: Not on file  Stress: Not on file  Social Connections: Not on file    Family History  Problem Relation Age of Onset   Heart disease Mother    Cancer - Lung Father     Outpatient Encounter Medications as of 10/21/2021  Medication Sig   Accu-Chek Softclix Lancets lancets USE TO TEST BLOOD GLUCOSE TWICE  DAILY   acetaminophen (TYLENOL) 500 MG tablet Take 2 tablets (1,000 mg total) by mouth every 6 (six) hours.   amLODipine (NORVASC) 2.5 MG tablet Take 1 tablet (2.5 mg total) by mouth daily.   aspirin EC 81 MG tablet Take 81 mg by mouth daily.   atorvastatin (LIPITOR) 40 MG tablet Take 1 tablet (40 mg total) by mouth daily.   Blood Glucose Monitoring Suppl (ACCU-CHEK GUIDE ME) w/Device KIT Use to test BG qid   cephALEXin (KEFLEX) 500 MG capsule Take 1 capsule (500 mg total) by mouth 3 (three) times daily.   cetirizine (ZYRTEC) 10 MG tablet Take 10 mg by mouth daily.   Cholecalciferol 50 MCG (2000 UT)  CAPS Take 1 capsule (2,000 Units total) by mouth daily with breakfast.   cilostazol (PLETAL) 50 MG tablet Take 1 tablet (50 mg total) by mouth 2 (two) times daily. (Patient not taking: Reported on 08/02/2021)   Coenzyme Q10 (CO Q-10) 200 MG CAPS Take 200 mg by mouth daily.   Cyanocobalamin (VITAMIN B-12 PO) Take 3 tablets  by mouth daily.   cyclobenzaprine (FLEXERIL) 10 MG tablet Take 1 tablet (10 mg total) by mouth 3 (three) times daily as needed for muscle spasms.   diclofenac sodium (VOLTAREN) 1 % GEL Apply 1 application. topically in the morning and at bedtime.   dicyclomine (BENTYL) 10 MG capsule Take 10 mg by mouth every 6 (six) hours as needed for spasms.   Docusate Calcium (STOOL SOFTENER PO) Take 1 capsule by mouth daily.   EPINEPHrine 0.3 mg/0.3 mL IJ SOAJ injection Inject 0.3 mg into the muscle as needed for anaphylaxis.   ferrous sulfate 325 (65 FE) MG tablet Take 1 tablet (325 mg total) by mouth 3 (three) times daily after meals for 14 days.   Fingerstix Lancets MISC Inject into the skin.   fluticasone (FLONASE) 50 MCG/ACT nasal spray Place 1 spray into both nostrils 2 (two) times daily.   furosemide (LASIX) 40 MG tablet Take 40 mg by mouth daily as needed for fluid or edema.   gabapentin (NEURONTIN) 600 MG tablet Take 600 mg by mouth in the morning, at noon, in the evening, and at bedtime.   glucose blood (ACCU-CHEK GUIDE) test strip USE TO CHECK BLOOD GLUCOSE DAILY AS DIRECTED   Iron-Vitamin C (VITRON-C PO) Take 1 tablet by mouth daily.   isosorbide mononitrate (IMDUR) 30 MG 24 hr tablet Take 1 tablet (30 mg total) by mouth daily.   lidocaine (LIDODERM) 5 % Place 1 patch onto the skin daily as needed. Remove & Discard patch within 12 hours or as directed by MD (Patient taking differently: Place 2 patches onto the skin See admin instructions. Put 2 patches on back 1-2 times a day as needed for pain)   methocarbamol (ROBAXIN) 500 MG tablet Take 1 tablet (500 mg total) by mouth every 8  (eight) hours as needed for muscle spasms.   methylPREDNISolone (MEDROL DOSEPAK) 4 MG TBPK tablet As directed   metoprolol tartrate (LOPRESSOR) 50 MG tablet Take 1 tablet (50 mg total) by mouth 2 (two) times daily.   Multiple Vitamin (MULTIVITAMIN WITH MINERALS) TABS tablet Take 2 tablets by mouth daily.   nitroGLYCERIN (NITROSTAT) 0.4 MG SL tablet Place 1 tablet (0.4 mg total) under the tongue every 5 (five) minutes as needed for chest pain (up to 3 doses). (Patient taking differently: Place 0.4 mg under the tongue every 5 (five) minutes as needed for chest pain.)   oxyCODONE (OXY IR/ROXICODONE) 5 MG immediate release tablet Take 1-2 tablets (5-10 mg total) by mouth every 4 (four) hours as needed for moderate pain or severe pain. Start with 1 tablet every 4 hours as needed for pain. Take 2 tablets only for severe pain. (Patient not taking: Reported on 10/21/2021)   pantoprazole (PROTONIX) 40 MG tablet Take 40 mg by mouth 2 (two) times daily as needed (acid reflux).   polyethylene glycol (MIRALAX / GLYCOLAX) 17 g packet Take 17 g by mouth daily as needed for mild constipation.   ranolazine (RANEXA) 1000 MG SR tablet Take 1 tablet (1,000 mg total) by mouth 2 (two) times daily.   ticagrelor (BRILINTA) 60 MG TABS tablet Take 1 tablet (60 mg total) by mouth 2 (two) times daily.   vitamin E 400 UNIT capsule Take 400 Units by mouth daily.   [DISCONTINUED] Cholecalciferol 50 MCG (2000 UT) CAPS Take 1 capsule (2,000 Units total) by mouth daily with breakfast.   Facility-Administered Encounter Medications as of 10/21/2021  Medication   sodium chloride flush (NS) 0.9 % injection 3 mL  ALLERGIES: Allergies  Allergen Reactions   Codeine Hives and Shortness Of Breath        Other Anaphylaxis    Nuts   Ciprofloxacin Itching   Valsartan Rash   Adhesive [Tape] Itching    "clear tape" used after surgery."took all her skin off".  Paper take is ok   Sulfa Antibiotics Nausea Only    VACCINATION  STATUS:  There is no immunization history on file for this patient.  Diabetes She presents for her follow-up diabetic visit. She has type 2 diabetes mellitus. Onset time: She was diagnosed at approximate age of 70 years. Her disease course has been stable. There are no hypoglycemic associated symptoms. Pertinent negatives for hypoglycemia include no confusion, headaches, pallor or seizures. Pertinent negatives for diabetes include no chest pain, no fatigue, no polydipsia, no polyphagia and no polyuria. There are no hypoglycemic complications. Symptoms are stable. Diabetic complications include heart disease. Risk factors for coronary artery disease include dyslipidemia, diabetes mellitus, hypertension, sedentary lifestyle, post-menopausal, obesity and family history. When asked about current treatments, none were reported. Her weight is increasing steadily. She is following a generally unhealthy diet. When asked about meal planning, she reported none. She has not had a previous visit with a dietitian. She never participates in exercise. Her home blood glucose trend is fluctuating minimally. Her breakfast blood glucose range is generally 130-140 mg/dl. Her overall blood glucose range is 130-140 mg/dl. (She presents with controlled glycemic profile.  Her meter shows average blood glucose of 130 for the last 30 days.  Her point-of-care A1c is 5.5% progressively improving.  She is not on medications for treatment of diabetes at this time.   During the previous visit, she was taken off of her Starlix last visit due to random, mild hypoglycemia.   ) An ACE inhibitor/angiotensin II receptor blocker is being taken. Eye exam is current.  Hyperlipidemia This is a chronic problem. The current episode started more than 1 year ago. The problem is controlled. Exacerbating diseases include diabetes. Pertinent negatives include no chest pain, myalgias or shortness of breath. Risk factors for coronary artery disease include  dyslipidemia, diabetes mellitus, hypertension, a sedentary lifestyle and post-menopausal.  Hypertension This is a chronic problem. The problem is controlled. Pertinent negatives include no chest pain, headaches, palpitations or shortness of breath. Risk factors for coronary artery disease include dyslipidemia, diabetes mellitus, sedentary lifestyle, family history and post-menopausal state. Past treatments include ACE inhibitors. Hypertensive end-organ damage includes CAD/MI.    Review of systems: Limited as above.  Objective:    BP 132/78   Pulse 84   Ht _0  (1.626 m)   Wt 172 lb 12.8 oz (78.4 kg)   BMI 29.66 kg/m   Wt Readings from Last 3 Encounters:  10/21/21 172 lb 12.8 oz (78.4 kg)  10/21/21 167 lb 15.9 oz (76.2 kg)  08/13/21 167 lb 15.9 oz (76.2 kg)     Physical Exam- Limited  Constitutional:  Body mass index is 29.66 kg/m. , not in acute distress, normal state of mind   CMP     Component Value Date/Time   NA 141 10/21/2021 0043   NA 143 01/14/2021 0920   K 3.9 10/21/2021 0043   CL 110 10/21/2021 0043   CO2 25 10/21/2021 0043   GLUCOSE 73 10/21/2021 0043   BUN 12 10/21/2021 0043   BUN 8 01/14/2021 0920   CREATININE 0.91 10/21/2021 0043   CREATININE 0.94 10/18/2018 1000   CALCIUM 8.8 (L) 10/21/2021 2751  PROT 6.7 10/21/2021 0043   PROT 7.0 01/14/2021 0920   ALBUMIN 3.4 (L) 10/21/2021 0043   ALBUMIN 4.3 01/14/2021 0920   AST 31 10/21/2021 0043   ALT 27 10/21/2021 0043   ALKPHOS 112 10/21/2021 0043   BILITOT 0.4 10/21/2021 0043   BILITOT 0.4 01/14/2021 0920   GFRNONAA >60 10/21/2021 0043   GFRNONAA 64 10/18/2018 1000   GFRAA 82 03/05/2020 1207   GFRAA 74 10/18/2018 1000    Diabetic Labs (most recent): Lab Results  Component Value Date   HGBA1C 5.5 10/21/2021   HGBA1C 6.1 (H) 02/13/2021   HGBA1C 5.8 07/17/2020   MICROALBUR 1.04 04/27/2019     Lipid Panel ( most recent) Lipid Panel     Component Value Date/Time   CHOL 136 01/11/2020 0000    CHOL 131 07/05/2018 1152   TRIG 61 01/11/2020 0000   HDL 83 (A) 01/11/2020 0000   HDL 83 07/05/2018 1152   CHOLHDL 1.6 07/05/2018 1152   CHOLHDL 1.8 04/15/2010 0400   VLDL 3 04/15/2010 0400   LDLCALC 41 01/11/2020 0000   LDLCALC 38 07/05/2018 1152      Lab Results  Component Value Date   TSH 1.440 01/14/2021   TSH 0.80 01/11/2020   TSH 1.16 04/27/2019   TSH 1.47 05/12/2018   TSH 1.540 01/01/2018   TSH 1.268 04/14/2010   FREET4 1.15 01/14/2021      Assessment & Plan:   1. DM type 2 causing vascular disease ([place])   - SHINIKA ESTELLE has currently controlled  type 2 DM since  66 years of age.  She presents with controlled glycemic profile.  Her meter shows average blood glucose of 130 for the last 30 days.  Her point-of-care A1c is 5.5% progressively improving.  She is not on medications for treatment of diabetes at this time.   During the previous visit, she was taken off of her Starlix last visit due to random, mild hypoglycemia.   -her diabetes is complicated by coronary artery disease, sedentary life, and she remains at a high risk for more acute and chronic complications which include CAD, CVA, CKD, retinopathy, and neuropathy. These are all discussed in detail with her.  - I have counseled her on diet management and weight loss, by adopting a carbohydrate restricted/protein rich diet.  - she acknowledges that there is a room for improvement in her food and drink choices.  - Suggestion is made for her to avoid simple carbohydrates  from her diet including Cakes, Sweet Desserts, Ice Cream, Soda (diet and regular), Sweet Tea, Candies, Chips, Cookies, Store Bought Juices, Alcohol , Artificial Sweeteners,  Coffee Creamer, and "Sugar-free" Products, Lemonade. This will help patient to have more stable blood glucose profile and potentially avoid unintended weight gain.  The following Lifestyle Medicine recommendations according to [facility])  were discussed and and offered to patient and she  agrees to start the journey:  A. Whole Foods, Plant-Based Nutrition comprising of fruits and vegetables, plant-based proteins, whole-grain carbohydrates was discussed in detail with the patient.   A list for source of those nutrients were also provided to the patient.  Patient will use only water or unsweetened tea for hydration. B.  The need to stay away from risky substances including alcohol, smoking; obtaining 7 to 9 hours of restorative sleep, at least 150 minutes of moderate intensity exercise weekly, the importance of healthy social connections,  and stress management techniques were discussed. - she is  advised to stick to a routine mealtimes to eat 3 meals  a day and avoid unnecessary snacks ( to snack only to correct hypoglycemia).  - I have approached her with the following individualized plan to manage  her diabetes and patient agrees:   - in light of her presentation with tightly controlled glycemic profile and target A1c of 5.5% , she will not need antidiabetic medications at this time.   She is willing to continue to monitor blood glucose at fasting, and will report if fasting blood glucose readings are greater than 200 mg/dl.  She was previously treated with Starlix, if she needs intervention, she will be considered for low dose metformin. - Patient specific target  A1c;  LDL, HDL, Triglycerides, were discussed in detail.  2) Blood Pressure /Hypertension: Her blood pressure is controlled to target. -   she is advised to continue her current medications including  Ramipril 5 mg p.o. daily with breakfast .  3) Lipids/Hyperlipidemia:   Review of her recent lipid panel showed controlled LDL of 41.  In light of her cardiac history,   she  is advised to continue Lipitor 20 mg p.o. daily at bedtime.  Side effects and precautions discussed with her.     4)  Weight/Diet: Her BMI is 29.7-she is a candidate for modest weight loss.   I  discussed with her the fact that loss of 5 - 10% of her  current body weight will have the most impact on her diabetes management.  CDE Consult will be initiated . Exercise, and detailed carbohydrates information provided  -  detailed on discharge instructions.  5) vitamin D deficiency: -Her 25-hydroxy vitamin D has dropped to 27.9 from 31.1.  GGT  She has stopped her vitamin D supplements.  She is advised to resume vitamin D3 2000 units daily.    6) Chronic Care/Health Maintenance:  -she  is on ACEI/ARB and Statin medications and  is encouraged to initiate and continue to follow up with Ophthalmology, Dentist,  Podiatrist at least yearly or according to recommendations, and advised to   stay away from smoking. I have recommended yearly flu vaccine and pneumonia vaccine at least every 5 years; moderate intensity exercise for up to 150 minutes weekly; and  sleep for at least 7 hours a day.  - she is  advised to maintain close follow up with Galen Manila, MD for primary care needs, as well as her other providersfor optimal and coordinated care.    I spent 35 minutes in the care of the patient today including review of labs from [place], Lipids, Thyroid Function, Hematology (current and previous including abstractions from other facilities); face-to-face time discussing  her blood glucose readings/logs, discussing hypoglycemia and hyperglycemia episodes and symptoms, medications doses, her options of short and long term treatment based on the latest standards of care / guidelines;  discussion about incorporating lifestyle medicine;  and documenting the encounter. Risk reduction counseling performed per USPSTF guidelines to reduce obesity and cardiovascular risk factors.     Please refer to Patient Instructions for Blood Glucose Monitoring and Insulin/Medications Dosing Guide"  in media tab for additional information. Please  also refer to " Patient Self Inventory" in the Media  tab for reviewed  elements of pertinent patient history.  Juel Burrow participated in the discussions, expressed understanding, and voiced agreement with the above plans.  All questions were answered to her satisfaction. she is encouraged to contact clinic should she have any questions or concerns  prior to her return visit.    Follow up plan: - Return in about 6 months (around 04/23/2022) for F/U with Pre-visit Labs, A1c -NV.  Glade Lloyd, MD [facility] [address] [place], [place] [address] Phone: [phone]  Fax: [phone]    10/21/2021, 2:50 PM  This note was partially dictated with voice recognition software. Similar sounding words can be transcribed inadequately or may not  be corrected upon review.

## 2021-10-21 NOTE — ED Notes (Signed)
Patient verbalizes understanding of discharge instructions. Opportunity for questioning and answers were provided. Armband removed by staff, pt discharged from ED. Pt taken to ED waiting room via wheel chair.  

## 2021-10-21 NOTE — ED Notes (Signed)
Pt ambulated to bathroom with staff supervision for safety.  ?

## 2021-10-21 NOTE — Patient Instructions (Signed)

## 2021-10-21 NOTE — Discharge Instructions (Addendum)
Your MRI shows stable postsurgical findings but you do have a bulging disc on the left side.  No evidence of acute spinal cord emergency today.  Take the steroids and muscle relaxers as prescribed as well as antibiotics for possible urinary tract infection.  Follow-up with Dr. Venetia Maxon.  Return to the ED with worsening pain, weakness, numbness, fever, bowel or bladder incontinence or other concerns

## 2021-10-22 LAB — URINE CULTURE: Culture: 50000 — AB

## 2021-10-24 ENCOUNTER — Telehealth: Payer: Self-pay | Admitting: Internal Medicine

## 2021-10-24 NOTE — Telephone Encounter (Signed)
Faxed MD portion/Rx for Brilinta to [facility] at [phone]

## 2021-12-02 ENCOUNTER — Encounter: Payer: Self-pay | Admitting: Internal Medicine

## 2021-12-02 ENCOUNTER — Ambulatory Visit: Payer: Medicare Other | Attending: Internal Medicine | Admitting: Internal Medicine

## 2021-12-02 VITALS — BP 130/74 | HR 64 | Ht 64.0 in | Wt 169.8 lb

## 2021-12-02 DIAGNOSIS — I10 Essential (primary) hypertension: Secondary | ICD-10-CM | POA: Diagnosis not present

## 2021-12-02 DIAGNOSIS — I25810 Atherosclerosis of coronary artery bypass graft(s) without angina pectoris: Secondary | ICD-10-CM | POA: Diagnosis not present

## 2021-12-02 DIAGNOSIS — Z951 Presence of aortocoronary bypass graft: Secondary | ICD-10-CM | POA: Diagnosis not present

## 2021-12-02 DIAGNOSIS — E785 Hyperlipidemia, unspecified: Secondary | ICD-10-CM

## 2021-12-02 NOTE — Patient Instructions (Signed)
Medication Instructions:  Your physician recommends that you continue on your current medications as directed. Please refer to the Current Medication list given to you today.  *If you need a refill on your cardiac medications before your next appointment, please call your pharmacy*   Follow-Up: At [facility], you and your health needs are our priority.  As part of our continuing mission to provide you with exceptional heart care, we have created designated Provider Care Teams.  These Care Teams include your primary Cardiologist (physician) and Advanced Practice Providers (APPs -  Physician Assistants and Nurse Practitioners) who all work together to provide you with the care you need, when you need it.  We recommend signing up for the patient portal called "MyChart".  Sign up information is provided on this After Visit Summary.  MyChart is used to connect with patients for Virtual Visits (Telemedicine).  Patients are able to view lab/test results, encounter notes, upcoming appointments, etc.  Non-urgent messages can be sent to your provider as well.   To learn more about what you can do with MyChart, go to [email or web address].    Your next appointment:   12 month(s)  The format for your next appointment:   In Person  Provider:   Kenneth C Hilty, MD    
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
## 2021-12-04 ENCOUNTER — Other Ambulatory Visit: Payer: Self-pay | Admitting: Student

## 2021-12-04 DIAGNOSIS — M461 Sacroiliitis, not elsewhere classified: Secondary | ICD-10-CM

## 2021-12-26 ENCOUNTER — Other Ambulatory Visit: Payer: Medicare Other

## 2022-02-02 ENCOUNTER — Other Ambulatory Visit: Payer: Self-pay | Admitting: "Endocrinology

## 2022-03-06 ENCOUNTER — Telehealth: Payer: Self-pay

## 2022-03-06 NOTE — Telephone Encounter (Signed)
   Patient Name: Tamara Nunez  DOB: [birth year] MRN: [id number]  Primary Cardiologist: Chrystie Nose, MD  Chart reviewed as part of pre-operative protocol coverage. Given past medical history and time since last visit, based on ACC/AHA guidelines, Tamara Nunez is at acceptable risk for the planned procedure without further cardiovascular testing.   The patient was advised that if she develops new symptoms prior to surgery to contact our office to arrange for a follow-up visit, and she verbalized understanding.  Per office protocol, she may hold Brilinta 5 days prior to procedure. Please resume as soon as possible postprocedure, at the discretion of the surgeon.    I will route this recommendation to the requesting party via Epic fax function and remove from pre-op pool.  Please call with questions.  Flossie Dibble, NP 03/06/2022, 12:07 PM

## 2022-03-06 NOTE — Telephone Encounter (Signed)
   Pre-operative Risk Assessment    Patient Name: Tamara Nunez  DOB: [birth year] MRN: [id number]      Request for Surgical Clearance    Procedure:   LUMBAR TRANSFORAMINAL LEFT L1-L2  Date of Surgery:  Clearance TBD                                 Surgeon:  DR Aileen Fass  Surgeon's Group or Practice Name:  [facility]  Phone number:  [phone] Fax number:  [phone]    Type of Clearance Requested:   - Pharmacy:  Hold Ticagrelor (Brilinta) 5 DAYS BEFORE   Type of Anesthesia:   NONE LISTED   Additional requests/questions:    Lucita Ferrara   03/06/2022, 11:20 AM

## 2022-03-12 ENCOUNTER — Telehealth: Payer: Self-pay | Admitting: Internal Medicine

## 2022-03-12 MED ORDER — ISOSORBIDE MONONITRATE ER 30 MG PO TB24: 30.0000 mg | ORAL_TABLET | Freq: Every day | ORAL | 1 refills | Status: DC

## 2022-03-12 NOTE — Telephone Encounter (Signed)
*  STAT* If patient is at the pharmacy, call can be transferred to refill team.   1. Which medications need to be refilled? (please list name of each medication and dose if known)   isosorbide mononitrate (IMDUR) 30 MG 24 hr tablet   2. Which pharmacy/location (including street and city if local pharmacy) is medication to be sent to? [place], [place]   3. Do they need a 30 day or 90 day supply? 90 day   Patient has enough for this week

## 2022-03-21 ENCOUNTER — Other Ambulatory Visit: Payer: Self-pay | Admitting: "Endocrinology

## 2022-04-09 ENCOUNTER — Telehealth: Payer: Self-pay | Admitting: Internal Medicine

## 2022-04-09 NOTE — Telephone Encounter (Signed)
Patient called, she states that she has been having on and off chest pains, she has had to take nitroglycerin. She states she is having to take it about twice a week. It does improve the chest pains. She notices the chest pains more when she lays down, and at times has to have multiple pillows in her bed. She is not currently having chest pains at this time. She does have issues with heartburn and acid reflux.   She states she does not have any other symptoms to note. I did try to get her in for Friday this week to review, but patient did not want to see a NP, she requested to push it until the 20th with a PA. We did discuss ED precautions. Patient verbalized understanding.   Will route to PA seeing patient on 02/20.  Thanks!

## 2022-04-09 NOTE — Telephone Encounter (Signed)
Patient returning call. She says to call her home number. Phone: [phone]

## 2022-04-09 NOTE — Telephone Encounter (Signed)
Pt c/o of Chest Pain: STAT if CP now or developed within 24 hours  1. Are you having CP right now? no  2. Are you experiencing any other symptoms (ex. SOB, nausea, vomiting, sweating)? Tired, sweating  3. How long have you been experiencing CP? A couple days  4. Is your CP continuous or coming and going? Goes away after nitroglycerin   5. Have you taken Nitroglycerin? yes ?  Patient states she has been having chest pains for the last couple days.   Pt c/o medication issue:  1. Name of Medication: ticagrelor (BRILINTA) 60 MG TABS tablet   2. How are you currently taking this medication (dosage and times per day)? 1 tablet twice a day  3. Are you having a reaction (difficulty breathing--STAT)? no  4. What is your medication issue? Patient states the medication has been causing bruising on her body and also has been giving knots her legs

## 2022-04-09 NOTE — Telephone Encounter (Signed)
Left a message for the pt to call back. Will forward to Dr [facility] nurse for further review.

## 2022-04-11 ENCOUNTER — Other Ambulatory Visit: Payer: Self-pay | Admitting: "Endocrinology

## 2022-04-11 LAB — COMPREHENSIVE METABOLIC PANEL
ALT: 53 IU/L — ABNORMAL HIGH (ref 0–32)
AST: 46 IU/L — ABNORMAL HIGH (ref 0–40)
Albumin/Globulin Ratio: 1.5 (ref 1.2–2.2)
Albumin: 4.6 g/dL (ref 3.9–4.9)
Alkaline Phosphatase: 143 IU/L — ABNORMAL HIGH (ref 44–121)
BUN/Creatinine Ratio: 15 (ref 12–28)
BUN: 13 mg/dL (ref 8–27)
Bilirubin Total: 0.6 mg/dL (ref 0.0–1.2)
CO2: 17 mmol/L — ABNORMAL LOW (ref 20–29)
Calcium: 9.5 mg/dL (ref 8.7–10.3)
Chloride: 105 mmol/L (ref 96–106)
Creatinine, Ser: 0.88 mg/dL (ref 0.57–1.00)
Globulin, Total: 3 g/dL (ref 1.5–4.5)
Glucose: 79 mg/dL (ref 70–99)
Potassium: 4.2 mmol/L (ref 3.5–5.2)
Sodium: 144 mmol/L (ref 134–144)
Total Protein: 7.6 g/dL (ref 6.0–8.5)
eGFR: 72 mL/min/{1.73_m2} (ref >59)

## 2022-04-11 LAB — LIPID PANEL
Chol/HDL Ratio: 1.7 ratio (ref 0.0–4.4)
Cholesterol, Total: 149 mg/dL (ref 100–199)
HDL: 87 mg/dL (ref >39)
LDL Chol Calc (NIH): 51 mg/dL (ref 0–99)
Triglycerides: 50 mg/dL (ref 0–149)
VLDL Cholesterol Cal: 11 mg/dL (ref 5–40)

## 2022-04-11 LAB — T4, FREE: Free T4: 1.33 ng/dL (ref 0.82–1.77)

## 2022-04-11 LAB — VITAMIN D 25 HYDROXY (VIT D DEFICIENCY, FRACTURES): Vit D, 25-Hydroxy: 35 ng/mL (ref 30.0–100.0)

## 2022-04-11 LAB — TSH: TSH: 0.698 u[IU]/mL (ref 0.450–4.500)

## 2022-04-18 ENCOUNTER — Ambulatory Visit: Payer: Medicare Other | Admitting: General Practice

## 2022-04-21 ENCOUNTER — Ambulatory Visit: Payer: Medicare Other | Admitting: "Endocrinology

## 2022-04-21 NOTE — Progress Notes (Deleted)
Cardiology Office Note:    Date:  04/21/2022   ID:  Tamara Nunez, DOB 24-Mar-1955, MRN [id number]  PCP:  Galen Manila, MD  Cardiologist:  Pixie Casino, MD  Electrophysiologist:  None   Referring MD: Galen Manila, MD   Chief Complaint: chest pain  History of Present Illness:    Tamara Nunez is a 67 y.o. female with a history of CAD s/p CABG x2 (LIMA-LAD and SVG-distal LAD) in 12/2004 with multiple subsequent PCI (DES to proximal RCA in 09/2016, DES to ostial LCX in 04/2017, DES to 1st Diag in 06/2018, repeat DES to in-stent restenosis of ostial to proximal 1st Diag in 6/202 with overlap of prior stent, and orbital atherectomy and DES to mid to distal RCA a few days later in 08/2019), hypertension, hyperlipidemia, type 2 diabetes, GERD, fibromyalgia, and chronic back pain who is followed by Dr. Debara Pickett and presents today for further evaluation of chest pain.  Patient has a long history of CAD with two vessel CABG in 12/2004. She was subsequently found to have an atretic LIMA to LAD and has undergone multiple PCIs with DES to the RCA, LCX, and 1st Diag as described above. Last PCI was in 08/2019. At that time, she was found to have focal in-stent restenosis of a the prior 1st Diag sent and diffuse heavily calcified disease throughout the mid to distal RCA. She underwent difficult but successful angioplasty with re-stenting of the ostial to proximal 1st Diag with DES that overlapped the proximal 1/4 of the previously placed stent on 08/12/2019 and then was taken back to the cath lab on 08/16/2019 and underwent orbital atherectomy and DES of the mid to distal RCA. Last cardiac catheterization in 02/2020 showed known atretic LIMA to LAD with patent SVG to LAD and patent stents of the RCA, LCX, and 1st Diag. Continued medical therapy was recommended at that time.  Patient was last seen by Dr. Debara Pickett in 11/2021 at which time she was doing well with no recurrent chest pain.   Patient called our  office on 04/09/2022 with reports of on and off chest pain and this visit was scheduled for further evaluation.   Chest Pain CAD s/p CABG Patient has a long history of CAD s/p CABG x2 (LIMA to LAD and SVG to distal LAD) in 2006 and multiple sequent PCI with DES to RCA, LCX, and 1st Diag. Last PCI was in 08/2019. Last LHC in 02/2020 showed known atretic LIMA to LAD but patent SVG to distal and patent stents.  - *** - Current antianginals: Amlodipine 2.51m daily, Imdur 331mdaily, Lopressor 5021mwice daily, and Ranexa 1,000 twice daily. *** - Continue DAPT with Aspirin 11m1mily and Brilinta 60mg70mce daily.  - Continue high-intensity statin.   Hypertension BP *** - Continue Amlodipine, Imdur, and Lopressor as above.  Hyperlipidemia Recent lipid panel on 04/10/2022: Total Cholesterol 149, Triglycerides 50, HDL 87, LDL 51. LDL goal <55. - Continue Lipitor 40mg 79my.   Type 2 Diabetes Mellitus Hemoglobin A1c 6.5% in 11/2021.  - Management per PCP.  Past Medical History:  Diagnosis Date   Arthritis    "in my back" (05/06/2017)   CAD (coronary artery disease)    CABG 2006, 09/2016 DES RCA, 04/2017 DES Circ, 06/2018 DES 1st diag   Chronic back pain    Coronary artery disease    a. s/p CABG in 2006 with LIMA-LAD, SVG-dLAD. b. cath in 2009 showing patent grafts. c. 09/2016: cath with occluded  LAD but patent SVG-dLAD. LIMA was atretic. pRCA with 80% stenosis --> treated with DES   Fibromyalgia    GERD (gastroesophageal reflux disease)    Hyperlipidemia    Hypertension    Type II diabetes mellitus ([place])     Past Surgical History:  Procedure Laterality Date   ABDOMINAL HERNIA REPAIR  2018   BACK SURGERY     CARDIAC CATHETERIZATION  01/13/2008   COLONOSCOPY     CORONARY ANGIOPLASTY WITH STENT PLACEMENT  09/26/2016   Prox RCA lesion, 80 %stenosed. DES    CORONARY ARTERY BYPASS GRAFT  12/23/2004   2 vessel - LIMA to LAD (atretic), SVG to distal LAD   CORONARY ATHERECTOMY N/A 08/16/2019    Procedure: CORONARY ATHERECTOMY;  Surgeon: Martinique, Peter M, MD;  Location: [place] [facility];  Service: Cardiovascular;  Laterality: N/A;   CORONARY STENT INTERVENTION N/A 09/26/2016   Procedure: Coronary Stent Intervention;  Surgeon: Leonie Man, MD;  Location: [place] [facility];  Service: Cardiovascular;  Laterality: N/A;   CORONARY STENT INTERVENTION N/A 05/06/2017   Procedure: CORONARY STENT INTERVENTION;  Surgeon: Burnell Blanks, MD;  Location: [place] [facility];  Service: Cardiovascular;  Laterality: N/A;   CORONARY STENT INTERVENTION N/A 06/25/2018   Procedure: CORONARY STENT INTERVENTION;  Surgeon: Martinique, Peter M, MD;  Location: [place] [facility];  Service: Cardiovascular;  Laterality: N/A;   CORONARY STENT INTERVENTION N/A 08/12/2019   Procedure: CORONARY STENT INTERVENTION;  Surgeon: Belva Crome, MD;  Location: [place] [facility];  Service: Cardiovascular;  Laterality: N/A;   CORONARY STENT INTERVENTION N/A 08/16/2019   Procedure: CORONARY STENT INTERVENTION;  Surgeon: Martinique, Peter M, MD;  Location: [place] [facility];  Service: Cardiovascular;  Laterality: N/A;   DOPPLER ECHOCARDIOGRAPHY  04/12/2008   EF 60%   GASTRIC BYPASS  2009   HERNIA REPAIR     LEFT HEART CATH AND CORONARY ANGIOGRAPHY N/A 05/06/2017   Procedure: LEFT HEART CATH AND CORONARY ANGIOGRAPHY;  Surgeon: Burnell Blanks, MD;  Location: [place] [facility];  Service: Cardiovascular;  Laterality: N/A;   LEFT HEART CATH AND CORS/GRAFTS ANGIOGRAPHY N/A 09/26/2016   Procedure: Left Heart Cath and Cors/Grafts Angiography;  Surgeon: Leonie Man, MD;  Location: [place] [facility];  Service: Cardiovascular;  Laterality: N/A;   LEFT HEART CATH AND CORS/GRAFTS ANGIOGRAPHY N/A 06/25/2018   Procedure: LEFT HEART CATH AND CORS/GRAFTS ANGIOGRAPHY;  Surgeon: Martinique, Peter M, MD;  Location: [place] [facility];  Service: Cardiovascular;  Laterality: N/A;   LEFT HEART CATH AND CORS/GRAFTS ANGIOGRAPHY N/A 08/12/2019    Procedure: LEFT HEART CATH AND CORS/GRAFTS ANGIOGRAPHY;  Surgeon: Belva Crome, MD;  Location: [place] [facility];  Service: Cardiovascular;  Laterality: N/A;   LEFT HEART CATH AND CORS/GRAFTS ANGIOGRAPHY N/A 03/08/2020   Procedure: LEFT HEART CATH AND CORS/GRAFTS ANGIOGRAPHY;  Surgeon: Martinique, Peter M, MD;  Location: [place] [facility];  Service: Cardiovascular;  Laterality: N/A;   NM MYOVIEW LTD  06/03/2012   EF 71%   POSTERIOR FUSION LUMBAR SPINE  1990s   TOTAL KNEE ARTHROPLASTY Left 02/26/2021   Procedure: TOTAL KNEE ARTHROPLASTY;  Surgeon: Paralee Cancel, MD;  Location: [facility];  Service: Orthopedics;  Laterality: Left;   TOTAL KNEE ARTHROPLASTY Right 08/13/2021   Procedure: TOTAL KNEE ARTHROPLASTY;  Surgeon: Paralee Cancel, MD;  Location: [facility];  Service: Orthopedics;  Laterality: Right;   TUBAL LIGATION     VAGINAL HYSTERECTOMY     "partial"  Current Medications: No outpatient medications have been marked as taking for the 04/29/22 encounter (Appointment) with Darreld Mclean, PA-C.   Current Facility-Administered Medications for the 04/29/22 encounter (Appointment) with Darreld Mclean, PA-C  Medication   sodium chloride flush (NS) 0.9 % injection 3 mL     Allergies:   Codeine, Other, Ciprofloxacin, Valsartan, Adhesive [tape], and Sulfa antibiotics   Social History   Socioeconomic History   Marital status: Married    Spouse name: Not on file   Number of children: Not on file   Years of education: Not on file   Highest education level: Not on file  Occupational History   Not on file  Tobacco Use   Smoking status: Never   Smokeless tobacco: Never  Vaping Use   Vaping Use: Never used  Substance and Sexual Activity   Alcohol use: No   Drug use: No   Sexual activity: Not on file  Other Topics Concern   Not on file  Social History Narrative   Not on file   Social Determinants of Health   Financial Resource Strain: Not on file  Food Insecurity: Not on file   Transportation Needs: Not on file  Physical Activity: Not on file  Stress: Not on file  Social Connections: Not on file     Family History: The patient's family history includes Cancer - Lung in her father; Heart disease in her mother.  ROS:   Please see the history of present illness.     EKGs/Labs/Other Studies Reviewed:    The following studies were reviewed:  Cardiac Catheterization 03/08/2020: Non-stenotic Ost 1st Diag-2 lesion was previously treated. Non-stenotic Ost 1st Diag-1 lesion was previously treated. Prox LAD to Mid LAD lesion is 100% stenosed. Prox LAD-2 lesion is 100% stenosed. Non-stenotic Prox LAD-1 lesion was previously treated. Ost 2nd Mrg lesion is 70% stenosed. Prox Cx lesion is 40% stenosed. Mid Cx lesion is 20% stenosed. Non-stenotic Ost Cx lesion was previously treated. Prox RCA lesion is 10% stenosed. Prox RCA to Mid RCA lesion is 30% stenosed. LIMA. Origin to Mid Graft lesion is 99% stenosed. SVG and is normal in caliber. Mid RCA to Dist RCA lesion is 35% stenosed. The left ventricular systolic function is normal. LV end diastolic pressure is normal. The left ventricular ejection fraction is 55-65% by visual estimate.   1. CAD with continued patency of prior stents. No new stenoses to explain her recent symptoms. 2. Patent SVG to LAD 3. Atretic LIMA to the LAD which is supplied by the SVG 4. Normal LV function 5. Normal LVEDP   Plan: continue medical therapy.  Diagnostic Dominance: Right     EKG:  EKG ordered today. EKG personally reviewed and demonstrates ***.  Recent Labs: 10/21/2021: Hemoglobin 9.9; Platelets 350 04/10/2022: ALT 53; BUN 13; Creatinine, Ser 0.88; Potassium 4.2; Sodium 144; TSH 0.698  Recent Lipid Panel    Component Value Date/Time   CHOL 149 04/10/2022 1209   TRIG 50 04/10/2022 1209   HDL 87 04/10/2022 1209   CHOLHDL 1.7 04/10/2022 1209   CHOLHDL 1.8 04/15/2010 0400   VLDL 3 04/15/2010 0400   LDLCALC 51  04/10/2022 1209    Physical Exam:    Vital Signs: There were no vitals taken for this visit.    Wt Readings from Last 3 Encounters:  12/02/21 169 lb 12.8 oz (77 kg)  10/21/21 172 lb 12.8 oz (78.4 kg)  10/21/21 167 lb 15.9 oz (76.2 kg)     General: 66  y.o. female in no acute distress. HEENT: Normocephalic and atraumatic. Sclera clear. EOMs intact. Neck: Supple. No carotid bruits. No JVD. Heart: *** RRR. Distinct S1 and S2. No murmurs, gallops, or rubs. Radial and distal pedal pulses 2+ and equal bilaterally. Lungs: No increased work of breathing. Clear to ausculation bilaterally. No wheezes, rhonchi, or rales.  Abdomen: Soft, non-distended, and non-tender to palpation. Bowel sounds present in all 4 quadrants.  MSK: Normal strength and tone for age. *** Extremities: No lower extremity edema.    Skin: Warm and dry. Neuro: Alert and oriented x3. No focal deficits. Psych: Normal affect. Responds appropriately.   Assessment:    No diagnosis found.  Plan:     Disposition: Follow up in ***   Medication Adjustments/Labs and Tests Ordered: Current medicines are reviewed at length with the patient today.  Concerns regarding medicines are outlined above.  No orders of the defined types were placed in this encounter.  No orders of the defined types were placed in this encounter.   There are no Patient Instructions on file for this visit.   Signed, Darreld Mclean, PA-C  04/21/2022 3:19 PM    [facility]

## 2022-04-22 ENCOUNTER — Ambulatory Visit: Payer: 59 | Admitting: "Endocrinology

## 2022-04-22 ENCOUNTER — Encounter: Payer: Self-pay | Admitting: "Endocrinology

## 2022-04-22 VITALS — BP 136/78 | HR 72 | Ht 64.0 in | Wt 163.0 lb

## 2022-04-22 DIAGNOSIS — E1159 Type 2 diabetes mellitus with other circulatory complications: Secondary | ICD-10-CM

## 2022-04-22 DIAGNOSIS — E559 Vitamin D deficiency, unspecified: Secondary | ICD-10-CM

## 2022-04-22 DIAGNOSIS — E782 Mixed hyperlipidemia: Secondary | ICD-10-CM | POA: Diagnosis not present

## 2022-04-22 DIAGNOSIS — I10 Essential (primary) hypertension: Secondary | ICD-10-CM

## 2022-04-22 LAB — POCT GLYCOSYLATED HEMOGLOBIN (HGB A1C): HbA1c, POC (controlled diabetic range): 5.7 % (ref 0.0–7.0)

## 2022-04-22 NOTE — Patient Instructions (Signed)

## 2022-04-22 NOTE — Progress Notes (Signed)
04/22/2022, 3:00 PM            Endocrinology follow-up note  Subjective:    Patient ID: Tamara Nunez, female    DOB: 09-Jul-1955.  Tamara Nunez is being seen in follow-up for type 2 diabetes which is currently controlled on diet and exercise with A1c of 5.5% improving from 6.3%.    PMD:   Galen Manila, MD.   Past Medical History:  Diagnosis Date  . Arthritis    "in my back" (05/06/2017)  . CAD (coronary artery disease)    CABG 2006, 09/2016 DES RCA, 04/2017 DES Circ, 06/2018 DES 1st diag  . Chronic back pain   . Coronary artery disease    a. s/p CABG in 2006 with LIMA-LAD, SVG-dLAD. b. cath in 2009 showing patent grafts. c. 09/2016: cath with occluded LAD but patent SVG-dLAD. LIMA was atretic. pRCA with 80% stenosis --> treated with DES  . Fibromyalgia   . GERD (gastroesophageal reflux disease)   . Hyperlipidemia   . Hypertension   . Type II diabetes mellitus ([place])     Past Surgical History:  Procedure Laterality Date  . ABDOMINAL HERNIA REPAIR  2018  . BACK SURGERY    . CARDIAC CATHETERIZATION  01/13/2008  . COLONOSCOPY    . CORONARY ANGIOPLASTY WITH STENT PLACEMENT  09/26/2016   Prox RCA lesion, 80 %stenosed. DES   . CORONARY ARTERY BYPASS GRAFT  12/23/2004   2 vessel - LIMA to LAD (atretic), SVG to distal LAD  . CORONARY ATHERECTOMY N/A 08/16/2019   Procedure: CORONARY ATHERECTOMY;  Surgeon: Martinique, Peter M, MD;  Location: [facility];  Service: Cardiovascular;  Laterality: N/A;  . CORONARY STENT INTERVENTION N/A 09/26/2016   Procedure: Coronary Stent Intervention;  Surgeon: Leonie Man, MD;  Location: [facility];  Service: Cardiovascular;  Laterality: N/A;  . CORONARY STENT INTERVENTION N/A 05/06/2017   Procedure: CORONARY STENT INTERVENTION;  Surgeon: Burnell Blanks, MD;  Location: [facility];  Service: Cardiovascular;  Laterality: N/A;  .  CORONARY STENT INTERVENTION N/A 06/25/2018   Procedure: CORONARY STENT INTERVENTION;  Surgeon: Martinique, Peter M, MD;  Location: [facility];  Service: Cardiovascular;  Laterality: N/A;  . CORONARY STENT INTERVENTION N/A 08/12/2019   Procedure: CORONARY STENT INTERVENTION;  Surgeon: Belva Crome, MD;  Location: [facility];  Service: Cardiovascular;  Laterality: N/A;  . CORONARY STENT INTERVENTION N/A 08/16/2019   Procedure: CORONARY STENT INTERVENTION;  Surgeon: Martinique, Peter M, MD;  Location: [facility];  Service: Cardiovascular;  Laterality: N/A;  . DOPPLER ECHOCARDIOGRAPHY  04/12/2008   EF 60%  . GASTRIC BYPASS  2009  . HERNIA REPAIR    . LEFT HEART CATH AND CORONARY ANGIOGRAPHY N/A 05/06/2017   Procedure: LEFT HEART CATH AND CORONARY ANGIOGRAPHY;  Surgeon: Burnell Blanks, MD;  Location: [facility];  Service: Cardiovascular;  Laterality: N/A;  . LEFT HEART CATH AND CORS/GRAFTS ANGIOGRAPHY N/A 09/26/2016   Procedure: Left Heart Cath and Cors/Grafts Angiography;  Surgeon: Leonie Man, MD;  Location: [facility];  Service: Cardiovascular;  Laterality: N/A;  . LEFT HEART CATH AND CORS/GRAFTS ANGIOGRAPHY N/A 06/25/2018   Procedure: LEFT HEART CATH AND CORS/GRAFTS ANGIOGRAPHY;  Surgeon: Martinique, Peter M, MD;  Location: [facility];  Service: Cardiovascular;  Laterality: N/A;  . LEFT HEART CATH AND CORS/GRAFTS ANGIOGRAPHY N/A 08/12/2019   Procedure: LEFT HEART CATH AND CORS/GRAFTS ANGIOGRAPHY;  Surgeon: Belva Crome, MD;  Location: [facility];  Service: Cardiovascular;  Laterality: N/A;  . LEFT HEART CATH AND CORS/GRAFTS ANGIOGRAPHY N/A 03/08/2020   Procedure: LEFT HEART CATH AND CORS/GRAFTS ANGIOGRAPHY;  Surgeon: Martinique, Peter M, MD;  Location: [facility];  Service: Cardiovascular;  Laterality: N/A;  . NM MYOVIEW LTD  06/03/2012   EF 71%  . POSTERIOR FUSION LUMBAR SPINE  1990s  . TOTAL KNEE ARTHROPLASTY Left 02/26/2021   Procedure:  TOTAL KNEE ARTHROPLASTY;  Surgeon: Paralee Cancel, MD;  Location: [facility];  Service: Orthopedics;  Laterality: Left;  . TOTAL KNEE ARTHROPLASTY Right 08/13/2021   Procedure: TOTAL KNEE ARTHROPLASTY;  Surgeon: Paralee Cancel, MD;  Location: [facility];  Service: Orthopedics;  Laterality: Right;  . TUBAL LIGATION    . VAGINAL HYSTERECTOMY     "partial"    Social History   Socioeconomic History  . Marital status: Married    Spouse name: Not on file  . Number of children: Not on file  . Years of education: Not on file  . Highest education level: Not on file  Occupational History  . Not on file  Tobacco Use  . Smoking status: Never  . Smokeless tobacco: Never  Vaping Use  . Vaping Use: Never used  Substance and Sexual Activity  . Alcohol use: No  . Drug use: No  . Sexual activity: Not on file  Other Topics Concern  . Not on file  Social History Narrative  . Not on file   Social Determinants of Health   Financial Resource Strain: Not on file  Food Insecurity: Not on file  Transportation Needs: Not on file  Physical Activity: Not on file  Stress: Not on file  Social Connections: Not on file    Family History  Problem Relation Age of Onset  . Heart disease Mother   . Cancer - Lung Father     Outpatient Encounter Medications as of 04/22/2022  Medication Sig  . Accu-Chek Softclix Lancets lancets USE TO TEST BLOOD GLUCOSE TWICE  DAILY  . acetaminophen (TYLENOL) 500 MG tablet Take 2 tablets (1,000 mg total) by mouth every 6 (six) hours.  Marland Kitchen albuterol (VENTOLIN HFA) 108 (90 Base) MCG/ACT inhaler   . amLODipine (NORVASC) 2.5 MG tablet Take 1 tablet (2.5 mg total) by mouth daily.  Marland Kitchen aspirin EC 81 MG tablet Take 81 mg by mouth daily.  Marland Kitchen atorvastatin (LIPITOR) 40 MG tablet Take 1 tablet (40 mg total) by mouth daily.  . Blood Glucose Monitoring Suppl (ACCU-CHEK GUIDE ME) w/Device KIT Use to test BG qid  . cephALEXin (KEFLEX) 500 MG capsule Take 1 capsule (500 mg total) by mouth 3 (three)  times daily.  . cetirizine (ZYRTEC) 10 MG tablet Take 10 mg by mouth daily.  . Cholecalciferol 50 MCG (2000 UT) CAPS Take 1 capsule (2,000 Units total) by mouth daily with breakfast.  . cilostazol (PLETAL) 50 MG tablet Take 1 tablet (50 mg total) by mouth 2 (two) times daily.  . Coenzyme Q10 (CO Q-10) 200 MG CAPS Take 200 mg by mouth daily.  . Cyanocobalamin (VITAMIN  B-12 PO) Take 3 tablets by mouth daily.  . cyclobenzaprine (FLEXERIL) 10 MG tablet Take 1 tablet (10 mg total) by mouth 3 (three) times daily as needed for muscle spasms.  . diclofenac sodium (VOLTAREN) 1 % GEL Apply 1 application. topically in the morning and at bedtime.  . dicyclomine (BENTYL) 10 MG capsule Take 10 mg by mouth every 6 (six) hours as needed for spasms.  Mariane Baumgarten Calcium (STOOL SOFTENER PO) Take 1 capsule by mouth daily.  Marland Kitchen EPINEPHrine 0.3 mg/0.3 mL IJ SOAJ injection Inject 0.3 mg into the muscle as needed for anaphylaxis.  . ferrous sulfate 325 (65 FE) MG tablet Take 1 tablet (325 mg total) by mouth 3 (three) times daily after meals for 14 days.  . Fingerstix Lancets MISC Inject into the skin.  . fluticasone (FLONASE) 50 MCG/ACT nasal spray Place 1 spray into both nostrils 2 (two) times daily.  . furosemide (LASIX) 40 MG tablet Take 40 mg by mouth daily as needed for fluid or edema.  . gabapentin (NEURONTIN) 600 MG tablet Take 600 mg by mouth in the morning, at noon, in the evening, and at bedtime.  Marland Kitchen glucose blood (ACCU-CHEK GUIDE) test strip USE TO CHECK BLOOD GLUCOSE DAILY AS DIRECTED  . Iron-Vitamin C (VITRON-C PO) Take 1 tablet by mouth daily.  . isosorbide mononitrate (IMDUR) 30 MG 24 hr tablet Take 1 tablet (30 mg total) by mouth daily.  Marland Kitchen lidocaine (LIDODERM) 5 % Place 1 patch onto the skin daily as needed. Remove & Discard patch within 12 hours or as directed by MD (Patient taking differently: Place 2 patches onto the skin See admin instructions. Put 2 patches on back 1-2 times a day as needed for pain)   . methocarbamol (ROBAXIN) 500 MG tablet Take 1 tablet (500 mg total) by mouth every 8 (eight) hours as needed for muscle spasms.  . metoprolol tartrate (LOPRESSOR) 50 MG tablet Take 1 tablet (50 mg total) by mouth 2 (two) times daily.  . Multiple Vitamin (MULTIVITAMIN WITH MINERALS) TABS tablet Take 2 tablets by mouth daily.  . nitroGLYCERIN (NITROSTAT) 0.4 MG SL tablet Place 1 tablet (0.4 mg total) under the tongue every 5 (five) minutes as needed for chest pain (up to 3 doses). (Patient taking differently: Place 0.4 mg under the tongue every 5 (five) minutes as needed for chest pain.)  . pantoprazole (PROTONIX) 40 MG tablet Take 40 mg by mouth 2 (two) times daily as needed (acid reflux).  . polyethylene glycol (MIRALAX / GLYCOLAX) 17 g packet Take 17 g by mouth daily as needed for mild constipation.  . ranolazine (RANEXA) 1000 MG SR tablet Take 1 tablet (1,000 mg total) by mouth 2 (two) times daily.  . ticagrelor (BRILINTA) 60 MG TABS tablet Take 1 tablet (60 mg total) by mouth 2 (two) times daily.  . traMADol (ULTRAM) 50 MG tablet Take 50 mg by mouth as needed.  . vitamin E 400 UNIT capsule Take 400 Units by mouth daily.   Facility-Administered Encounter Medications as of 04/22/2022  Medication  . sodium chloride flush (NS) 0.9 % injection 3 mL    ALLERGIES: Allergies  Allergen Reactions  . Codeine Hives and Shortness Of Breath       . Other Anaphylaxis    Nuts  . Ciprofloxacin Itching  . Valsartan Rash  . Adhesive [Tape] Itching    "clear tape" used after surgery."took all her skin off".  Paper take is ok  . Sulfa Antibiotics Nausea Only  VACCINATION STATUS: Immunization History  Administered Date(s) Administered  . Moderna Sars-Covid-2 Vaccination 04/23/2019, 05/21/2019, 12/30/2019, 07/07/2020    Diabetes She presents for her follow-up diabetic visit. She has type 2 diabetes mellitus. Onset time: She was diagnosed at approximate age of 22 years. Her disease course has  been stable. There are no hypoglycemic associated symptoms. Pertinent negatives for hypoglycemia include no confusion, headaches, pallor or seizures. Pertinent negatives for diabetes include no chest pain, no fatigue, no polydipsia, no polyphagia and no polyuria. There are no hypoglycemic complications. Symptoms are stable. Diabetic complications include heart disease. Risk factors for coronary artery disease include dyslipidemia, diabetes mellitus, hypertension, sedentary lifestyle, post-menopausal, obesity and family history. When asked about current treatments, none were reported. Her weight is increasing steadily. She is following a generally unhealthy diet. When asked about meal planning, she reported none. She has not had a previous visit with a dietitian. She never participates in exercise. Her home blood glucose trend is fluctuating minimally. Her breakfast blood glucose range is generally 130-140 mg/dl. Her overall blood glucose range is 130-140 mg/dl. (She presents with controlled glycemic profile.  Her meter shows average blood glucose of 130 for the last 30 days.  Her point-of-care A1c is 5.5% progressively improving.  She is not on medications for treatment of diabetes at this time.   During the previous visit, she was taken off of her Starlix last visit due to random, mild hypoglycemia.   ) An ACE inhibitor/angiotensin II receptor blocker is being taken. Eye exam is current.  Hyperlipidemia This is a chronic problem. The current episode started more than 1 year ago. The problem is controlled. Exacerbating diseases include diabetes. Pertinent negatives include no chest pain, myalgias or shortness of breath. Risk factors for coronary artery disease include dyslipidemia, diabetes mellitus, hypertension, a sedentary lifestyle and post-menopausal.  Hypertension This is a chronic problem. The problem is controlled. Pertinent negatives include no chest pain, headaches, palpitations or shortness of  breath. Risk factors for coronary artery disease include dyslipidemia, diabetes mellitus, sedentary lifestyle, family history and post-menopausal state. Past treatments include ACE inhibitors. Hypertensive end-organ damage includes CAD/MI.    Review of systems: Limited as above.  Objective:    BP 136/78   Pulse 72   Ht 5' 4"$  (1.626 m)   Wt 163 lb (73.9 kg)   BMI 27.98 kg/m   Wt Readings from Last 3 Encounters:  04/22/22 163 lb (73.9 kg)  12/02/21 169 lb 12.8 oz (77 kg)  10/21/21 172 lb 12.8 oz (78.4 kg)     Physical Exam- Limited  Constitutional:  Body mass index is 27.98 kg/m. , not in acute distress, normal state of mind   CMP     Component Value Date/Time   NA 144 04/10/2022 1209   K 4.2 04/10/2022 1209   CL 105 04/10/2022 1209   CO2 17 (L) 04/10/2022 1209   GLUCOSE 79 04/10/2022 1209   GLUCOSE 73 10/21/2021 0043   BUN 13 04/10/2022 1209   CREATININE 0.88 04/10/2022 1209   CREATININE 0.94 10/18/2018 1000   CALCIUM 9.5 04/10/2022 1209   PROT 7.6 04/10/2022 1209   ALBUMIN 4.6 04/10/2022 1209   AST 46 (H) 04/10/2022 1209   ALT 53 (H) 04/10/2022 1209   ALKPHOS 143 (H) 04/10/2022 1209   BILITOT 0.6 04/10/2022 1209   GFRNONAA >60 10/21/2021 0043   GFRNONAA 64 10/18/2018 1000   GFRAA 82 03/05/2020 1207   GFRAA 74 10/18/2018 1000    Diabetic Labs (most recent): Lab  Results  Component Value Date   HGBA1C 5.7 04/22/2022   HGBA1C 5.5 10/21/2021   HGBA1C 6.1 (H) 02/13/2021   MICROALBUR 1.04 04/27/2019     Lipid Panel ( most recent) Lipid Panel     Component Value Date/Time   CHOL 149 04/10/2022 1209   TRIG 50 04/10/2022 1209   HDL 87 04/10/2022 1209   CHOLHDL 1.7 04/10/2022 1209   CHOLHDL 1.8 04/15/2010 0400   VLDL 3 04/15/2010 0400   LDLCALC 51 04/10/2022 1209      Lab Results  Component Value Date   TSH 0.698 04/10/2022   TSH 1.440 01/14/2021   TSH 0.80 01/11/2020   TSH 1.16 04/27/2019   TSH 1.47 05/12/2018   TSH 1.540 01/01/2018   TSH 1.268  04/14/2010   FREET4 1.33 04/10/2022   FREET4 1.15 01/14/2021      Assessment & Plan:   1. DM type 2 causing vascular disease ([place])   - Tamara Nunez has currently controlled  type 2 DM since  67 years of age.  She presents with controlled glycemic profile.  Her meter shows average blood glucose of 130 for the last 30 days.  Her point-of-care A1c is 5.5% progressively improving.  She is not on medications for treatment of diabetes at this time.   During the previous visit, she was taken off of her Starlix last visit due to random, mild hypoglycemia.   -her diabetes is complicated by coronary artery disease, sedentary life, and she remains at a high risk for more acute and chronic complications which include CAD, CVA, CKD, retinopathy, and neuropathy. These are all discussed in detail with her.  - I have counseled her on diet management and weight loss, by adopting a carbohydrate restricted/protein rich diet.  - she acknowledges that there is a room for improvement in her food and drink choices.  - Suggestion is made for her to avoid simple carbohydrates  from her diet including Cakes, Sweet Desserts, Ice Cream, Soda (diet and regular), Sweet Tea, Candies, Chips, Cookies, Store Bought Juices, Alcohol , Artificial Sweeteners,  Coffee Creamer, and "Sugar-free" Products, Lemonade. This will help patient to have more stable blood glucose profile and potentially avoid unintended weight gain.  The following Lifestyle Medicine recommendations according to [facility]) were discussed and and offered to patient and she  agrees to start the journey:  A. Whole Foods, Plant-Based Nutrition comprising of fruits and vegetables, plant-based proteins, whole-grain carbohydrates was discussed in detail with the patient.   A list for source of those nutrients were also provided to the patient.  Patient will use only water or unsweetened tea for hydration. B.  The need to  stay away from risky substances including alcohol, smoking; obtaining 7 to 9 hours of restorative sleep, at least 150 minutes of moderate intensity exercise weekly, the importance of healthy social connections,  and stress management techniques were discussed. - she is advised to stick to a routine mealtimes to eat 3 meals  a day and avoid unnecessary snacks ( to snack only to correct hypoglycemia).  - I have approached her with the following individualized plan to manage  her diabetes and patient agrees:   - in light of her presentation with tightly controlled glycemic profile and target A1c of 5.5% , she will not need antidiabetic medications at this time.   She is willing to continue to monitor blood glucose at fasting, and will report if fasting blood glucose readings are  greater than 200 mg/dl.  She was previously treated with Starlix, if she needs intervention, she will be considered for low dose metformin. - Patient specific target  A1c;  LDL, HDL, Triglycerides, were discussed in detail.  2) Blood Pressure /Hypertension: Her blood pressure is controlled to target. -   she is advised to continue her current medications including  Ramipril 5 mg p.o. daily with breakfast .  3) Lipids/Hyperlipidemia:   Review of her recent lipid panel showed controlled LDL of 41.  In light of her cardiac history,   she  is advised to continue Lipitor 20 mg p.o. daily at bedtime.  Side effects and precautions discussed with her.     4)  Weight/Diet: Her BMI is 29.7-she is a candidate for modest weight loss.   I discussed with her the fact that loss of 5 - 10% of her  current body weight will have the most impact on her diabetes management.  CDE Consult will be initiated . Exercise, and detailed carbohydrates information provided  -  detailed on discharge instructions.  5) vitamin D deficiency: -Her 25-hydroxy vitamin D has dropped to 27.9 from 31.1.  GGT  She has stopped her vitamin D supplements.  She is  advised to resume vitamin D3 2000 units daily.    6) Chronic Care/Health Maintenance:  -she  is on ACEI/ARB and Statin medications and  is encouraged to initiate and continue to follow up with Ophthalmology, Dentist,  Podiatrist at least yearly or according to recommendations, and advised to   stay away from smoking. I have recommended yearly flu vaccine and pneumonia vaccine at least every 5 years; moderate intensity exercise for up to 150 minutes weekly; and  sleep for at least 7 hours a day.  - she is  advised to maintain close follow up with Galen Manila, MD for primary care needs, as well as her other providersfor optimal and coordinated care.    I spent 35 minutes in the care of the patient today including review of labs from [place], Lipids, Thyroid Function, Hematology (current and previous including abstractions from other facilities); face-to-face time discussing  her blood glucose readings/logs, discussing hypoglycemia and hyperglycemia episodes and symptoms, medications doses, her options of short and long term treatment based on the latest standards of care / guidelines;  discussion about incorporating lifestyle medicine;  and documenting the encounter. Risk reduction counseling performed per USPSTF guidelines to reduce obesity and cardiovascular risk factors.     Please refer to Patient Instructions for Blood Glucose Monitoring and Insulin/Medications Dosing Guide"  in media tab for additional information. Please  also refer to " Patient Self Inventory" in the Media  tab for reviewed elements of pertinent patient history.  Tamara Nunez participated in the discussions, expressed understanding, and voiced agreement with the above plans.  All questions were answered to her satisfaction. she is encouraged to contact clinic should she have any questions or concerns prior to her return visit.    Follow up plan: - Return in about 6 months (around 10/21/2022) for A1c -[place].  Glade Lloyd,  MD [facility] [address] [place], [place] [address] Phone: [phone]  Fax: [phone]    04/22/2022, 3:00 PM  This note was partially dictated with voice recognition software. Similar sounding words can be transcribed inadequately or may not  be corrected upon review.

## 2022-04-29 ENCOUNTER — Other Ambulatory Visit: Payer: Self-pay | Admitting: Neurosurgery

## 2022-04-29 ENCOUNTER — Ambulatory Visit: Payer: Medicare Other | Admitting: Student

## 2022-04-29 DIAGNOSIS — M4696 Unspecified inflammatory spondylopathy, lumbar region: Secondary | ICD-10-CM

## 2022-05-14 ENCOUNTER — Other Ambulatory Visit: Payer: Self-pay

## 2022-05-14 MED ORDER — ACCU-CHEK GUIDE VI STRP: 1.0000 | ORAL_STRIP | Freq: Two times a day (BID) | 2 refills | Status: DC

## 2022-05-16 ENCOUNTER — Other Ambulatory Visit: Payer: Self-pay | Admitting: Internal Medicine

## 2022-05-25 ENCOUNTER — Other Ambulatory Visit: Payer: Self-pay | Admitting: Internal Medicine

## 2022-05-27 ENCOUNTER — Ambulatory Visit
Admission: RE | Admit: 2022-05-27 | Discharge: 2022-05-27 | Disposition: A | Discharge status: Home / Self Care | Payer: Medicare Other | Source: Ambulatory Visit | Attending: Neurosurgery | Admitting: Neurosurgery

## 2022-05-27 DIAGNOSIS — M4696 Unspecified inflammatory spondylopathy, lumbar region: Secondary | ICD-10-CM

## 2022-07-02 ENCOUNTER — Ambulatory Visit: Payer: Medicare Other | Attending: Student | Admitting: Physician Assistant

## 2022-07-02 ENCOUNTER — Encounter: Payer: Self-pay | Admitting: Physician Assistant

## 2022-07-02 VITALS — BP 146/80 | HR 57 | Ht 64.0 in | Wt 164.4 lb

## 2022-07-02 DIAGNOSIS — E785 Hyperlipidemia, unspecified: Secondary | ICD-10-CM | POA: Diagnosis not present

## 2022-07-02 DIAGNOSIS — E119 Type 2 diabetes mellitus without complications: Secondary | ICD-10-CM

## 2022-07-02 DIAGNOSIS — I25709 Atherosclerosis of coronary artery bypass graft(s), unspecified, with unspecified angina pectoris: Secondary | ICD-10-CM | POA: Diagnosis not present

## 2022-07-02 DIAGNOSIS — I10 Essential (primary) hypertension: Secondary | ICD-10-CM | POA: Diagnosis not present

## 2022-07-02 DIAGNOSIS — R072 Precordial pain: Secondary | ICD-10-CM

## 2022-07-02 MED ORDER — ISOSORBIDE MONONITRATE ER 60 MG PO TB24: 60.0000 mg | ORAL_TABLET | Freq: Every day | ORAL | 3 refills | Status: DC

## 2022-07-02 NOTE — Progress Notes (Signed)
Cardiology Office Note:    Date:  07/04/2022   ID:  Tamara Nunez, DOB 1955-09-29, MRN [id number]  PCP:  Theodoro Kos, MD   [facility] Providers Cardiologist:  Chrystie Nose, MD     Referring MD: Theodoro Kos, MD   Chief Complaint  Patient presents with   Follow-up    Seen for Dr. Rennis Golden    History of Present Illness:    Tamara Nunez is a 67 y.o. female with a hx of CAD s/p CABG 2006 with LIMA to LAD, SVG to distal LAD, hypertension, hyperlipidemia, DM II, GERD, and fibromyalgia.  Cardiac catheterization in 2009 showed widely patent grafts.  Myoview in March 2014 was low risk. Patient underwent repeat cardiac catheterization in July 2018 that demonstrated 80% proximal RCA lesion that was treated with DES, she also had atretic LIMA to LAD with 100% occlusion of mid LAD, patent SVG to distal LAD, EF 55 to 60%.  Unfortunately despite intervention, she continued to have intermittent chest discomfort.  Last cardiac catheterization performed in April 2020 demonstrated a single obstructive vessel with chronic total occlusion of mid LAD that was bypassed by SVG graft.  New 90% proximal D1 that was felt to be culprit lesion, this was treated with a drug-eluting stent.  She again underwent cardiac catheterization by Dr. Swaziland in June 2021 that demonstrated 75% distal RCA lesion that was treated with drug-eluting stent.     Her chest pain initially resolved after previous PCI in June 2021, however it has recurred.  We eventually recommended relook cath on 03/08/2020 which showed patent stents, atretic LIMA to LAD, patent SVG to LAD, no new stenosis to explain her overall symptom.  Medical therapy was recommended.  Lower extremity Doppler performed in December 2021 revealed noncompressible ABI bilaterally with tibial vessel disease but no large vessel disease. It is recommended she continue dual antiplatelet therapy lifelong.  She was placed on Pletal for lower extremity  claudication symptoms.   Patient presents today for follow-up.  For the past several months, she has been having intermittent left-sided chest discomfort radiating down the left arm that occurs about 1-2 times per week.  Each time lasting about 5 to 10 minutes.  She denies any exacerbating factors such as deep inspiration, body rotation on palpation.  The onset of the chest discomfort does not correlate with physical activity.  She also complaining of significant bruising and lumps in the lower extremity.  It is not clear to me if the lower extremity lump is due to small hematoma.  I decided to remove Pletal to reduce bruising.  Given the intermittent chest discomfort, I am hesitant to stop aspirin or Brilinta.  She is on low-dose maintenance therapy of Brilinta.  I recommend increase Imdur to 60 mg daily for antianginal purposes.  It we will obtain PET stress test.  We will see the patient back in 6 to 8 weeks.   Past Medical History:  Diagnosis Date   Arthritis    "in my back" (05/06/2017)   CAD (coronary artery disease)    CABG 2006, 09/2016 DES RCA, 04/2017 DES Circ, 06/2018 DES 1st diag   Chronic back pain    Coronary artery disease    a. s/p CABG in 2006 with LIMA-LAD, SVG-dLAD. b. cath in 2009 showing patent grafts. c. 09/2016: cath with occluded LAD but patent SVG-dLAD. LIMA was atretic. pRCA with 80% stenosis --> treated with DES   Fibromyalgia    GERD (gastroesophageal  reflux disease)    Hyperlipidemia    Hypertension    Type II diabetes mellitus (HCC)     Past Surgical History:  Procedure Laterality Date   ABDOMINAL HERNIA REPAIR  2018   BACK SURGERY     CARDIAC CATHETERIZATION  01/13/2008   COLONOSCOPY     CORONARY ANGIOPLASTY WITH STENT PLACEMENT  09/26/2016   Prox RCA lesion, 80 %stenosed. DES    CORONARY ARTERY BYPASS GRAFT  12/23/2004   2 vessel - LIMA to LAD (atretic), SVG to distal LAD   CORONARY ATHERECTOMY N/A 08/16/2019   Procedure: CORONARY ATHERECTOMY;  Surgeon:  Swaziland, Peter M, MD;  Location: [facility] INVASIVE CV LAB;  Service: Cardiovascular;  Laterality: N/A;   CORONARY STENT INTERVENTION N/A 09/26/2016   Procedure: Coronary Stent Intervention;  Surgeon: Marykay Lex, MD;  Location: [facility] INVASIVE CV LAB;  Service: Cardiovascular;  Laterality: N/A;   CORONARY STENT INTERVENTION N/A 05/06/2017   Procedure: CORONARY STENT INTERVENTION;  Surgeon: Kathleene Hazel, MD;  Location: [facility] INVASIVE CV LAB;  Service: Cardiovascular;  Laterality: N/A;   CORONARY STENT INTERVENTION N/A 06/25/2018   Procedure: CORONARY STENT INTERVENTION;  Surgeon: Swaziland, Peter M, MD;  Location: [facility] INVASIVE CV LAB;  Service: Cardiovascular;  Laterality: N/A;   CORONARY STENT INTERVENTION N/A 08/12/2019   Procedure: CORONARY STENT INTERVENTION;  Surgeon: Lyn Records, MD;  Location: [facility] INVASIVE CV LAB;  Service: Cardiovascular;  Laterality: N/A;   CORONARY STENT INTERVENTION N/A 08/16/2019   Procedure: CORONARY STENT INTERVENTION;  Surgeon: Swaziland, Peter M, MD;  Location: [facility] INVASIVE CV LAB;  Service: Cardiovascular;  Laterality: N/A;   DOPPLER ECHOCARDIOGRAPHY  04/12/2008   EF 60%   GASTRIC BYPASS  2009   HERNIA REPAIR     LEFT HEART CATH AND CORONARY ANGIOGRAPHY N/A 05/06/2017   Procedure: LEFT HEART CATH AND CORONARY ANGIOGRAPHY;  Surgeon: Kathleene Hazel, MD;  Location: [facility] INVASIVE CV LAB;  Service: Cardiovascular;  Laterality: N/A;   LEFT HEART CATH AND CORS/GRAFTS ANGIOGRAPHY N/A 09/26/2016   Procedure: Left Heart Cath and Cors/Grafts Angiography;  Surgeon: Marykay Lex, MD;  Location: [facility] INVASIVE CV LAB;  Service: Cardiovascular;  Laterality: N/A;   LEFT HEART CATH AND CORS/GRAFTS ANGIOGRAPHY N/A 06/25/2018   Procedure: LEFT HEART CATH AND CORS/GRAFTS ANGIOGRAPHY;  Surgeon: Swaziland, Peter M, MD;  Location: [facility] INVASIVE CV LAB;  Service: Cardiovascular;  Laterality: N/A;   LEFT HEART CATH AND CORS/GRAFTS ANGIOGRAPHY N/A 08/12/2019   Procedure: LEFT HEART CATH AND CORS/GRAFTS  ANGIOGRAPHY;  Surgeon: Lyn Records, MD;  Location: [facility] INVASIVE CV LAB;  Service: Cardiovascular;  Laterality: N/A;   LEFT HEART CATH AND CORS/GRAFTS ANGIOGRAPHY N/A 03/08/2020   Procedure: LEFT HEART CATH AND CORS/GRAFTS ANGIOGRAPHY;  Surgeon: Swaziland, Peter M, MD;  Location: [facility] INVASIVE CV LAB;  Service: Cardiovascular;  Laterality: N/A;   NM MYOVIEW LTD  06/03/2012   EF 71%   POSTERIOR FUSION LUMBAR SPINE  1990s   TOTAL KNEE ARTHROPLASTY Left 02/26/2021   Procedure: TOTAL KNEE ARTHROPLASTY;  Surgeon: Durene Romans, MD;  Location: [facility] ORS;  Service: Orthopedics;  Laterality: Left;   TOTAL KNEE ARTHROPLASTY Right 08/13/2021   Procedure: TOTAL KNEE ARTHROPLASTY;  Surgeon: Durene Romans, MD;  Location: [facility] ORS;  Service: Orthopedics;  Laterality: Right;   TUBAL LIGATION     VAGINAL HYSTERECTOMY     "partial"    Current Medications: Current Meds  Medication Sig   Accu-Chek Softclix Lancets lancets USE TO TEST BLOOD GLUCOSE TWICE  DAILY  acetaminophen (TYLENOL) 500 MG tablet Take 2 tablets (1,000 mg total) by mouth every 6 (six) hours.   albuterol (VENTOLIN HFA) 108 (90 Base) MCG/ACT inhaler    aspirin EC 81 MG tablet Take 81 mg by mouth daily.   atorvastatin (LIPITOR) 40 MG tablet Take 1 tablet (40 mg total) by mouth daily.   Blood Glucose Monitoring Suppl (ACCU-CHEK GUIDE ME) w/Device KIT Use to test BG qid   BRILINTA 60 MG TABS tablet TAKE 1 TABLET BY MOUTH TWICE  DAILY   cetirizine (ZYRTEC) 10 MG tablet Take 10 mg by mouth daily.   Cholecalciferol 50 MCG (2000 UT) CAPS Take 1 capsule (2,000 Units total) by mouth daily with breakfast.   Coenzyme Q10 (CO Q-10) 200 MG CAPS Take 200 mg by mouth daily.   Cyanocobalamin (VITAMIN B-12 PO) Take 3 tablets by mouth daily.   cyclobenzaprine (FLEXERIL) 10 MG tablet Take 1 tablet (10 mg total) by mouth 3 (three) times daily as needed for muscle spasms.   diclofenac sodium (VOLTAREN) 1 % GEL Apply 1 application. topically in the morning and at bedtime.    dicyclomine (BENTYL) 10 MG capsule Take 10 mg by mouth every 6 (six) hours as needed for spasms.   Docusate Calcium (STOOL SOFTENER PO) Take 1 capsule by mouth daily.   EPINEPHrine 0.3 mg/0.3 mL IJ SOAJ injection Inject 0.3 mg into the muscle as needed for anaphylaxis.   Fingerstix Lancets MISC Inject into the skin.   fluticasone (FLONASE) 50 MCG/ACT nasal spray Place 1 spray into both nostrils 2 (two) times daily.   furosemide (LASIX) 40 MG tablet Take 40 mg by mouth daily as needed for fluid or edema.   gabapentin (NEURONTIN) 600 MG tablet Take 600 mg by mouth in the morning, at noon, in the evening, and at bedtime.   glucose blood (ACCU-CHEK GUIDE) test strip USE TO CHECK BLOOD GLUCOSE DAILY AS DIRECTED   glucose blood (ACCU-CHEK GUIDE) test strip 1 each by Other route 2 (two) times daily. Use as instructed bid. E11.65   Iron-Vitamin C (VITRON-C PO) Take 1 tablet by mouth daily.   lidocaine (LIDODERM) 5 % Place 1 patch onto the skin daily as needed. Remove & Discard patch within 12 hours or as directed by MD (Patient taking differently: Place 2 patches onto the skin See admin instructions. Put 2 patches on back 1-2 times a day as needed for pain)   methocarbamol (ROBAXIN) 500 MG tablet Take 1 tablet (500 mg total) by mouth every 8 (eight) hours as needed for muscle spasms.   metoprolol tartrate (LOPRESSOR) 50 MG tablet Take 1 tablet (50 mg total) by mouth 2 (two) times daily.   Multiple Vitamin (MULTIVITAMIN WITH MINERALS) TABS tablet Take 2 tablets by mouth daily.   nitroGLYCERIN (NITROSTAT) 0.4 MG SL tablet Place 1 tablet (0.4 mg total) under the tongue every 5 (five) minutes as needed for chest pain (up to 3 doses). (Patient taking differently: Place 0.4 mg under the tongue every 5 (five) minutes as needed for chest pain.)   pantoprazole (PROTONIX) 40 MG tablet Take 40 mg by mouth 2 (two) times daily as needed (acid reflux).   polyethylene glycol (MIRALAX / GLYCOLAX) 17 g packet Take 17 g by  mouth daily as needed for mild constipation.   ranolazine (RANEXA) 1000 MG SR tablet Take 1 tablet (1,000 mg total) by mouth 2 (two) times daily.   traMADol (ULTRAM) 50 MG tablet Take 50 mg by mouth as needed.   vitamin E  400 UNIT capsule Take 400 Units by mouth daily.   [DISCONTINUED] cilostazol (PLETAL) 50 MG tablet Take 1 tablet (50 mg total) by mouth 2 (two) times daily.   [DISCONTINUED] isosorbide mononitrate (IMDUR) 30 MG 24 hr tablet TAKE 1 TABLET BY MOUTH DAILY   Current Facility-Administered Medications for the 07/02/22 encounter (Office Visit) with Azalee Course, PA  Medication   sodium chloride flush (NS) 0.9 % injection 3 mL     Allergies:   Codeine, Other, Ciprofloxacin, Valsartan, Adhesive [tape], and Sulfa antibiotics   Social History   Socioeconomic History   Marital status: Married    Spouse name: Not on file   Number of children: Not on file   Years of education: Not on file   Highest education level: Not on file  Occupational History   Not on file  Tobacco Use   Smoking status: Never   Smokeless tobacco: Never  Vaping Use   Vaping Use: Never used  Substance and Sexual Activity   Alcohol use: No   Drug use: No   Sexual activity: Not on file  Other Topics Concern   Not on file  Social History Narrative   Not on file   Social Determinants of Health   Financial Resource Strain: Not on file  Food Insecurity: Not on file  Transportation Needs: Not on file  Physical Activity: Not on file  Stress: Not on file  Social Connections: Not on file     Family History: The patient's family history includes Cancer - Lung in her father; Heart disease in her mother.  ROS:   Please see the history of present illness.     All other systems reviewed and are negative.  EKGs/Labs/Other Studies Reviewed:    The following studies were reviewed today:  Cath 03/08/2020 Non-stenotic Ost 1st Diag-2 lesion was previously treated. Non-stenotic Ost 1st Diag-1 lesion was  previously treated. Prox LAD to Mid LAD lesion is 100% stenosed. Prox LAD-2 lesion is 100% stenosed. Non-stenotic Prox LAD-1 lesion was previously treated. Ost 2nd Mrg lesion is 70% stenosed. Prox Cx lesion is 40% stenosed. Mid Cx lesion is 20% stenosed. Non-stenotic Ost Cx lesion was previously treated. Prox RCA lesion is 10% stenosed. Prox RCA to Mid RCA lesion is 30% stenosed. LIMA. Origin to Mid Graft lesion is 99% stenosed. SVG and is normal in caliber. Mid RCA to Dist RCA lesion is 35% stenosed. The left ventricular systolic function is normal. LV end diastolic pressure is normal. The left ventricular ejection fraction is 55-65% by visual estimate.   1. CAD with continued patency of prior stents. No new stenoses to explain her recent symptoms. 2. Patent SVG to LAD 3. Atretic LIMA to the LAD which is supplied by the SVG 4. Normal LV function 5. Normal LVEDP   Plan: continue medical therapy.  EKG:  EKG is ordered today.  The ekg ordered today demonstrates normal sinus rhythm, right bundle branch block.  Recent Labs: 10/21/2021: Hemoglobin 9.9; Platelets 350 04/10/2022: ALT 53; BUN 13; Creatinine, Ser 0.88; Potassium 4.2; Sodium 144; TSH 0.698  Recent Lipid Panel    Component Value Date/Time   CHOL 149 04/10/2022 1209   TRIG 50 04/10/2022 1209   HDL 87 04/10/2022 1209   CHOLHDL 1.7 04/10/2022 1209   CHOLHDL 1.8 04/15/2010 0400   VLDL 3 04/15/2010 0400   LDLCALC 51 04/10/2022 1209     Risk Assessment/Calculations:           Physical Exam:    VS:  BP (!) 146/80 (BP Location: Left Arm, Patient Position: Sitting, Cuff Size: Normal)   Pulse (!) 57   Ht 5\' 4"  (1.626 m)   Wt 164 lb 6.4 oz (74.6 kg)   SpO2 98%   BMI 28.22 kg/m        Wt Readings from Last 3 Encounters:  07/02/22 164 lb 6.4 oz (74.6 kg)  04/22/22 163 lb (73.9 kg)  12/02/21 169 lb 12.8 oz (77 kg)     GEN:  Well nourished, well developed in no acute distress HEENT: Normal NECK: No JVD; No  carotid bruits LYMPHATICS: No lymphadenopathy CARDIAC: RRR, no murmurs, rubs, gallops RESPIRATORY:  Clear to auscultation without rales, wheezing or rhonchi  ABDOMEN: Soft, non-tender, non-distended MUSCULOSKELETAL:  No edema; No deformity  SKIN: Warm and dry NEUROLOGIC:  Alert and oriented x 3 PSYCHIATRIC:  Normal affect   ASSESSMENT:    1. Precordial pain   2. Coronary artery disease involving coronary bypass graft of native heart with angina pectoris (HCC)   3. Essential hypertension   4. Hyperlipidemia LDL goal <70   5. Controlled type 2 diabetes mellitus without complication, without long-term current use of insulin (HCC)    PLAN:    In order of problems listed above:  Chest pain: Increase Imdur to 60 mg daily.  Obtain PET stress test.  Stop Pletal due to bruising.  CAD s/p CABG: Continue aspirin, Lipitor and Brilinta  Hypertension: Blood pressure stable  Hyperlipidemia: On Lipitor  DM2: Managed by primary care provider.      Shared Decision Making/Informed Consent The risks [chest pain, shortness of breath, cardiac arrhythmias, dizziness, blood pressure fluctuations, myocardial infarction, stroke/transient ischemic attack, nausea, vomiting, allergic reaction, radiation exposure, metallic taste sensation and life-threatening complications (estimated to be 1 in 10,000)], benefits (risk stratification, diagnosing coronary artery disease, treatment guidance) and alternatives of a cardiac PET stress test were discussed in detail with Ms. Trego and she agrees to proceed.    Medication Adjustments/Labs and Tests Ordered: Current medicines are reviewed at length with the patient today.  Concerns regarding medicines are outlined above.  Orders Placed This Encounter  Procedures   NM PET CT CARDIAC PERFUSION MULTI W/ABSOLUTE BLOODFLOW   EKG 12-Lead   Meds ordered this encounter  Medications   isosorbide mononitrate (IMDUR) 60 MG 24 hr tablet    Sig: Take 1 tablet (60 mg  total) by mouth daily.    Dispense:  90 tablet    Refill:  3    Dose change new Rx    Patient Instructions  Medication Instructions:   STOP Pletal  INCREASE Imdur to 60 mg daily  *If you need a refill on your cardiac medications before your next appointment, please call your pharmacy*  Lab Work: NONE ordered at this time of appointment   If you have labs (blood work) drawn today and your tests are completely normal, you will receive your results only by: MyChart Message (if you have MyChart) OR A paper copy in the mail If you have any lab test that is abnormal or we need to change your treatment, we will call you to review the results.   Testing/Procedures: CARDIAC PET- Your physician has requested that you have a Cardiac Pet Stress Test. This testing is completed at [facility] ([address] [place], [place] [address]). The schedulers will call you to get this scheduled. Please follow instructions below and call the office with any questions/concerns [phone]).    Follow-Up: At [facility], you and your health needs are our priority.  As part of our continuing mission to provide you with exceptional heart care, we have created designated Provider Care Teams.  These Care Teams include your primary Cardiologist (physician) and Advanced Practice Providers (APPs -  Physician Assistants and Nurse Practitioners) who all work together to provide you with the care you need, when you need it.  Your next appointment:   8 week(s)  Provider:   Azalee Course, PA-C or APP        Other Instructions  How to Prepare for Your Cardiac PET/CT Stress Test:  1. Please do not take these medications before your test: Metoprolol Tartrate (Lopressor), and Imdur   Medications that may interfere with the cardiac pharmacological stress agent (ex. nitrates - including erectile dysfunction medications, isosorbide mononitrate, tamulosin or beta-blockers) the day of the  exam. (Erectile dysfunction medication should be held for at least 72 hrs prior to test)  Theophylline containing medications for 12 hours. Dipyridamole 48 hours prior to the test. Your remaining medications may be taken with water.  2. Nothing to eat or drink, except water, 3 hours prior to arrival time.   NO caffeine/decaffeinated products, or chocolate 12 hours prior to arrival.  3. NO perfume, cologne or lotion  4. Total time is 1 to 2 hours; you may want to bring reading material for the waiting time.  5. Please report to Radiology at the [facility] Main Entrance 30 minutes early for your test.  [address] [place], [place] [address]  Diabetic Preparation:  Hold oral medications. You may take NPH and Lantus insulin. Do not take Humalog or Humulin R (Regular Insulin) the day of your test. Check blood sugars prior to leaving the house. If able to eat breakfast prior to 3 hour fasting, you may take all medications, including your insulin, Do not worry if you miss your breakfast dose of insulin - start at your next meal.  IF YOU THINK YOU MAY BE PREGNANT, OR ARE NURSING PLEASE INFORM THE TECHNOLOGIST.  In preparation for your appointment, medication and supplies will be purchased.  Appointment availability is limited, so if you need to cancel or reschedule, please call the Radiology Department at [phone]  24 hours in advance to avoid a cancellation fee of $100.00  What to Expect After you Arrive:  Once you arrive and check in for your appointment, you will be taken to a preparation room within the Radiology Department.  A technologist or Nurse will obtain your medical history, verify that you are correctly prepped for the exam, and explain the procedure.  Afterwards,  an IV will be started in your arm and electrodes will be placed on your skin for EKG monitoring during the stress portion of the exam. Then you will be escorted to the PET/CT scanner.  There, staff  will get you positioned on the scanner and obtain a blood pressure and EKG.  During the exam, you will continue to be connected to the EKG and blood pressure machines.  A small, safe amount of a radioactive tracer will be injected in your IV to obtain a series of pictures of your heart along with an injection of a stress agent.    After your Exam:  It is recommended that you eat a meal and drink a caffeinated beverage to counter act any effects of the stress agent.  Drink plenty of fluids for the remainder of the day and urinate frequently for the first  couple of hours after the exam.  Your doctor will inform you of your test results within 7-10 business days.  For questions about your test or how to prepare for your test, please call: Rockwell Alexandria, Cardiac Imaging Nurse Navigator  Larey Brick, Cardiac Imaging Nurse Navigator Office: [phone]     Ramond Dial, [place]  07/04/2022 9:57 PM    [facility]

## 2022-07-02 NOTE — Patient Instructions (Signed)
Medication Instructions:   STOP Pletal  INCREASE Imdur to 60 mg daily  *If you need a refill on your cardiac medications before your next appointment, please call your pharmacy*  Lab Work: NONE ordered at this time of appointment   If you have labs (blood work) drawn today and your tests are completely normal, you will receive your results only by: MyChart Message (if you have MyChart) OR A paper copy in the mail If you have any lab test that is abnormal or we need to change your treatment, we will call you to review the results.   Testing/Procedures: CARDIAC PET- Your physician has requested that you have a Cardiac Pet Stress Test. This testing is completed at [facility] ([address] [place], [place] [address]). The schedulers will call you to get this scheduled. Please follow instructions below and call the office with any questions/concerns [phone]).    Follow-Up: At [facility], you and your health needs are our priority.  As part of our continuing mission to provide you with exceptional heart care, we have created designated Provider Care Teams.  These Care Teams include your primary Cardiologist (physician) and Advanced Practice Providers (APPs -  Physician Assistants and Nurse Practitioners) who all work together to provide you with the care you need, when you need it.  Your next appointment:   8 week(s)  Provider:   Azalee Course, PA-C or APP        Other Instructions  How to Prepare for Your Cardiac PET/CT Stress Test:  1. Please do not take these medications before your test: Metoprolol Tartrate (Lopressor), and Imdur   Medications that may interfere with the cardiac pharmacological stress agent (ex. nitrates - including erectile dysfunction medications, isosorbide mononitrate, tamulosin or beta-blockers) the day of the exam. (Erectile dysfunction medication should be held for at least 72 hrs prior to test)  Theophylline containing  medications for 12 hours. Dipyridamole 48 hours prior to the test. Your remaining medications may be taken with water.  2. Nothing to eat or drink, except water, 3 hours prior to arrival time.   NO caffeine/decaffeinated products, or chocolate 12 hours prior to arrival.  3. NO perfume, cologne or lotion  4. Total time is 1 to 2 hours; you may want to bring reading material for the waiting time.  5. Please report to Radiology at the [facility] Main Entrance 30 minutes early for your test.  [address] [place], [place] [address]  Diabetic Preparation:  Hold oral medications. You may take NPH and Lantus insulin. Do not take Humalog or Humulin R (Regular Insulin) the day of your test. Check blood sugars prior to leaving the house. If able to eat breakfast prior to 3 hour fasting, you may take all medications, including your insulin, Do not worry if you miss your breakfast dose of insulin - start at your next meal.  IF YOU THINK YOU MAY BE PREGNANT, OR ARE NURSING PLEASE INFORM THE TECHNOLOGIST.  In preparation for your appointment, medication and supplies will be purchased.  Appointment availability is limited, so if you need to cancel or reschedule, please call the Radiology Department at [phone]  24 hours in advance to avoid a cancellation fee of $100.00  What to Expect After you Arrive:  Once you arrive and check in for your appointment, you will be taken to a preparation room within the Radiology Department.  A technologist or Nurse will obtain your medical history, verify that  you are correctly prepped for the exam, and explain the procedure.  Afterwards,  an IV will be started in your arm and electrodes will be placed on your skin for EKG monitoring during the stress portion of the exam. Then you will be escorted to the PET/CT scanner.  There, staff will get you positioned on the scanner and obtain a blood pressure and EKG.  During the exam, you will continue to be  connected to the EKG and blood pressure machines.  A small, safe amount of a radioactive tracer will be injected in your IV to obtain a series of pictures of your heart along with an injection of a stress agent.    After your Exam:  It is recommended that you eat a meal and drink a caffeinated beverage to counter act any effects of the stress agent.  Drink plenty of fluids for the remainder of the day and urinate frequently for the first couple of hours after the exam.  Your doctor will inform you of your test results within 7-10 business days.  For questions about your test or how to prepare for your test, please call: Rockwell Alexandria, Cardiac Imaging Nurse Navigator  Larey Brick, Cardiac Imaging Nurse Navigator Office: [phone]

## 2022-07-03 ENCOUNTER — Telehealth: Payer: Self-pay | Admitting: Physician Assistant

## 2022-07-03 NOTE — Telephone Encounter (Signed)
Medication Instructions:    STOP Pletal  INCREASE Imdur to 60 mg daily     Returned call to patient who states that she was not taking the Pletal (Cilostazol)- patient went through medications at home while on the phone- patient states she has not been taking this. Patient would like to know that since she hasn't been taking this- how is she going to know what has been causing her significant bruising and knots on her body. Advised patient I would forward message over to [facility] PA-C for review and advice. Patient verbalized understanding.

## 2022-07-03 NOTE — Telephone Encounter (Signed)
Pt would like a callback after noticing she doesn't have the medication she was advised to stop taking at her office visit yesterday.   Medication Instructions:    STOP Pletal  INCREASE Imdur to 60 mg daily

## 2022-07-03 NOTE — Telephone Encounter (Signed)
Only medication remaining that likely contribute to her bruising is ASA and Brilinta, both of which are antiplatelet meds. It is good she is not currently taking the pletal. With her symptom, I would still recommend increase imdur. If her PET stress test looks good, I may decide to stop ASA to see if that may help with the bruising

## 2022-07-04 ENCOUNTER — Encounter: Payer: Self-pay | Admitting: Physician Assistant

## 2022-07-07 NOTE — Telephone Encounter (Signed)
Returned call to patient and made her aware of  Tamara Course PA-C recommendations. Patient verbalized understanding.   Patient also states that if scheduling  calls regarding PET Stress- to call her Cell Number # [phone] (Mobile)   Will forward to scheduling to make aware.   Advised patient to call back to office with any issues, questions, or concerns. Patient verbalized understanding.

## 2022-07-10 ENCOUNTER — Other Ambulatory Visit (HOSPITAL_COMMUNITY): Payer: Self-pay | Admitting: *Deleted

## 2022-07-19 ENCOUNTER — Other Ambulatory Visit: Payer: Self-pay | Admitting: "Endocrinology

## 2022-07-23 ENCOUNTER — Other Ambulatory Visit: Payer: Self-pay

## 2022-07-23 MED ORDER — ACCU-CHEK GUIDE VI STRP: 1.0000 | ORAL_STRIP | Freq: Two times a day (BID) | 2 refills | Status: AC

## 2022-07-25 ENCOUNTER — Other Ambulatory Visit: Payer: Self-pay | Admitting: Internal Medicine

## 2022-08-01 ENCOUNTER — Telehealth (HOSPITAL_COMMUNITY): Payer: Self-pay | Admitting: *Deleted

## 2022-08-01 NOTE — Telephone Encounter (Signed)
Reaching out to patient to offer assistance regarding upcoming cardiac imaging study; pt verbalizes understanding of appt date/time, parking situation and where to check in, pre-test NPO status and verified current allergies; name and call back number provided for further questions should they arise  Larey Brick RN Navigator Cardiac Imaging [facility] [phone] office [phone] cell  Patient aware to avoid caffeine 12 hours prior  and Imdur the day before her cardiac PET scan.

## 2022-08-06 ENCOUNTER — Encounter (HOSPITAL_COMMUNITY)
Admission: RE | Admit: 2022-08-06 | Discharge: 2022-08-06 | Disposition: A | Discharge status: Home / Self Care | Payer: 59 | Source: Ambulatory Visit | Attending: Physician Assistant | Admitting: Physician Assistant

## 2022-08-06 DIAGNOSIS — R072 Precordial pain: Secondary | ICD-10-CM | POA: Diagnosis not present

## 2022-08-06 LAB — NM PET CT CARDIAC PERFUSION MULTI W/ABSOLUTE BLOODFLOW
LV dias vol: 94 mL (ref 46–106)
LV sys vol: 28 mL
MBFR: 2.29
Nuc Rest EF: 70 %
Nuc Stress EF: 70 %
Peak HR: 80 {beats}/min
Rest HR: 61 {beats}/min
Rest MBF: 0.84 ml/g/min
Rest Nuclear Isotope Dose: 19.3 mCi
ST Depression (mm): 0 mm
Stress MBF: 1.92 ml/g/min
Stress Nuclear Isotope Dose: 19.3 mCi
TID: 1.11

## 2022-08-06 MED ORDER — RUBIDIUM RB82 GENERATOR (RUBYFILL)
19.2800 | PACK | Freq: Once | INTRAVENOUS | Status: AC
  Administered 2022-08-06: 19.28 via INTRAVENOUS

## 2022-08-06 MED ORDER — REGADENOSON 0.4 MG/5ML IV SOLN
INTRAVENOUS | Status: AC
  Filled 2022-08-06: qty 5

## 2022-08-06 MED ORDER — RUBIDIUM RB82 GENERATOR (RUBYFILL)
19.2500 | PACK | Freq: Once | INTRAVENOUS | Status: AC
  Administered 2022-08-06: 19.25 via INTRAVENOUS

## 2022-08-06 MED ORDER — REGADENOSON 0.4 MG/5ML IV SOLN
0.4000 mg | Freq: Once | INTRAVENOUS | Status: AC
  Administered 2022-08-06: 0.4 mg via INTRAVENOUS

## 2022-08-06 NOTE — Progress Notes (Signed)
Low risk stress test. Normal perfusion. Normal pumping function of heart.

## 2022-08-27 ENCOUNTER — Ambulatory Visit: Payer: Medicare Other | Attending: Physician Assistant | Admitting: Physician Assistant

## 2022-08-27 ENCOUNTER — Encounter: Payer: Self-pay | Admitting: Physician Assistant

## 2022-08-27 VITALS — BP 152/78 | HR 79 | Ht 64.0 in | Wt 166.4 lb

## 2022-08-27 DIAGNOSIS — E785 Hyperlipidemia, unspecified: Secondary | ICD-10-CM | POA: Diagnosis not present

## 2022-08-27 DIAGNOSIS — I10 Essential (primary) hypertension: Secondary | ICD-10-CM

## 2022-08-27 DIAGNOSIS — I25810 Atherosclerosis of coronary artery bypass graft(s) without angina pectoris: Secondary | ICD-10-CM

## 2022-08-27 DIAGNOSIS — E119 Type 2 diabetes mellitus without complications: Secondary | ICD-10-CM

## 2022-08-27 NOTE — Patient Instructions (Signed)
Medication Instructions:  The current medical regimen is effective;  continue present plan and medications as directed. Please refer to the Current Medication list given to you today.  *If you need a refill on your cardiac medications before your next appointment, please call your pharmacy*  Lab Work: NONE ordered at this time of appointment   If you have labs (blood work) drawn today and your tests are completely normal, you will receive your results only by:   MyChart Message (if you have MyChart) OR  A paper copy in the mail If you have any lab test that is abnormal or we need to change your treatment, we will call you to review the results.  Testing/Procedures: NONE ordered at this time of appointment    Follow-Up: At [facility], you and your health needs are our priority.  As part of our continuing mission to provide you with exceptional heart care, we have created designated Provider Care Teams.  These Care Teams include your primary Cardiologist (physician) and Advanced Practice Providers (APPs -  Physician Assistants and Nurse Practitioners) who all work together to provide you with the care you need, when you need it.  Your next appointment:   6 month(s)  Provider:   Chrystie Nose, MD     Other Instructions

## 2022-08-27 NOTE — Progress Notes (Signed)
Cardiology Office Note:  .   Date:  08/29/2022  ID:  Tamara Nunez, DOB 1955-06-23, MRN [id number] PCP: Theodoro Kos, MD  [facility] Providers Cardiologist:  Chrystie Nose, MD     History of Present Illness: .   Tamara Nunez is a 67 y.o. female with a hx of CAD s/p CABG 2006 with LIMA to LAD, SVG to distal LAD, hypertension, hyperlipidemia, DM II, GERD, and fibromyalgia.  Cardiac catheterization in 2009 showed widely patent grafts.  Myoview in March 2014 was low risk. Patient underwent repeat cardiac catheterization in July 2018 that demonstrated 80% proximal RCA lesion that was treated with DES, she also had atretic LIMA to LAD with 100% occlusion of mid LAD, patent SVG to distal LAD, EF 55 to 60%.  Unfortunately despite intervention, she continued to have intermittent chest discomfort.  Last cardiac catheterization performed in April 2020 demonstrated a single obstructive vessel with chronic total occlusion of mid LAD that was bypassed by SVG graft.  New 90% proximal D1 that was felt to be culprit lesion, this was treated with a drug-eluting stent.  She again underwent cardiac catheterization by Dr. [place] in June 2021 that demonstrated 75% distal RCA lesion that was treated with drug-eluting stent.     Her chest pain initially resolved after previous PCI in June 2021, however it has recurred.  We eventually recommended relook cath on 03/08/2020 which showed patent stents, atretic LIMA to LAD, patent SVG to LAD, no new stenosis to explain her overall symptom.  Medical therapy was recommended.  Lower extremity Doppler performed in December 2021 revealed noncompressible ABI bilaterally with tibial vessel disease but no large vessel disease. It is recommended she continue dual antiplatelet therapy lifelong.  She was placed on Pletal for lower extremity claudication symptoms.   I last saw the patient on 07/02/2022 at which time she complained of intermittent left-sided chest pain  radiating down the left arm that occurs 1-2 times per week.  Each time only lasting 5 to 10 minutes.  Symptom is not exertional.  She also complaining of significant bruising and lumps in the lower extremity.  I stopped Pletal to reduce bruising.  Given intermittent chest discomfort, I was hesitant to stop aspirin or Brilinta.  I increased her Imdur to 60 mg daily and recommended outpatient PET stress test.  PET stress test obtained on 08/06/2022 showed EF 70%, overall low risk study with no ischemia or infarction.  Patient presents today for follow-up.  Her chest pain has improved.  Her primary concern is still bruising in her upper at the lower extremity.  I did not see significant upper extremity bruising, however she says she had a bruise that recently healed.  Overall, she is doing well from the cardiac perspective.  Regarding her bruising, although initial plan was to continue dual antiplatelet therapy for life, however given significant bruising, I think would be reasonable to discontinue aspirin and continue on low-dose Brilinta by itself.  I will check with Dr. Rennis Golden on this.  Otherwise, she can follow-up in 6 months with Dr. Rennis Golden.  ROS:   She denies chest pain, palpitations, dyspnea, pnd, orthopnea, n, v, dizziness, syncope, edema, weight gain, or early satiety. All other systems reviewed and are otherwise negative except as noted above.    Studies Reviewed: .        Cardiac Studies & Procedures   CARDIAC CATHETERIZATION  CARDIAC CATHETERIZATION 03/08/2020  Narrative  Non-stenotic Ost 1st Diag-2 lesion was previously treated.  Non-stenotic Ost 1st Diag-1 lesion was previously treated.  Prox LAD to Mid LAD lesion is 100% stenosed.  Prox LAD-2 lesion is 100% stenosed.  Non-stenotic Prox LAD-1 lesion was previously treated.  Ost 2nd Mrg lesion is 70% stenosed.  Prox Cx lesion is 40% stenosed.  Mid Cx lesion is 20% stenosed.  Non-stenotic Ost Cx lesion was previously  treated.  Prox RCA lesion is 10% stenosed.  Prox RCA to Mid RCA lesion is 30% stenosed.  LIMA.  Origin to Mid Graft lesion is 99% stenosed.  SVG and is normal in caliber.  Mid RCA to Dist RCA lesion is 35% stenosed.  The left ventricular systolic function is normal.  LV end diastolic pressure is normal.  The left ventricular ejection fraction is 55-65% by visual estimate.  1. CAD with continued patency of prior stents. No new stenoses to explain her recent symptoms. 2. Patent SVG to LAD 3. Atretic LIMA to the LAD which is supplied by the SVG 4. Normal LV function 5. Normal LVEDP  Plan: continue medical therapy.  Findings Coronary Findings Diagnostic  Dominance: Right  Left Anterior Descending Vessel is large. Non-stenotic Prox LAD-1 lesion was previously treated. Prox LAD-2 lesion is 100% stenosed. Prox LAD to Mid LAD lesion is 100% stenosed. The lesion is chronically occluded.  First Diagonal Branch Vessel is moderate in size. Non-stenotic Ost 1st Diag-1 lesion was previously treated. Non-stenotic Ost 1st Diag-2 lesion was previously treated.  Second Diagonal Branch Vessel is small in size.  Third Diagonal Branch Vessel is small in size.  Left Circumflex Non-stenotic Ost Cx lesion was previously treated. Prox Cx lesion is 40% stenosed. Mid Cx lesion is 20% stenosed.  First Obtuse Marginal Branch Vessel is small in size.  Second Obtuse Marginal Branch Vessel is small in size. Ost 2nd Mrg lesion is 70% stenosed.  Third Obtuse Marginal Branch Vessel is large in size.  Right Coronary Artery Vessel is moderate in size. Prox RCA lesion is 10% stenosed. The lesion was previously treated using a drug eluting stent over 2 years ago. Prox RCA to Mid RCA lesion is 30% stenosed. The lesion is calcified. Mid RCA to Dist RCA lesion is 35% stenosed. The lesion is severely calcified. The lesion was previously treated using a drug eluting stent between 6-12 months  ago.  LIMA LIMA Graft To Mid LAD LIMA. The graft is atretic. Origin to Mid Graft lesion is 99% stenosed.  Saphenous Graft To Mid LAD SVG and is normal in caliber.  Intervention  No interventions have been documented.   CARDIAC CATHETERIZATION  CARDIAC CATHETERIZATION 08/16/2019  Narrative  Mid RCA to Dist RCA lesion is 75% stenosed.  Post intervention, there is a 0% residual stenosis.  A drug-eluting stent was successfully placed using a STENT RESOLUTE ONYX 2.5X34.  1. Successful PCI of the mid RCA using orbital atherectomy and DES x 1  Plan: DAPT for at least one year. Anticipate DC tomorrow.  Findings Coronary Findings Diagnostic  Dominance: Right  Left Anterior Descending Vessel is large. Previously placed Prox LAD-1 stent (unknown type) is widely patent. Prox LAD-2 lesion is 100% stenosed. Prox LAD to Mid LAD lesion is 100% stenosed. The lesion is chronically occluded.  First Diagonal Branch Vessel is moderate in size. Previously placed Ost 1st Diag-1 stent (unknown type) is widely patent. Non-stenotic Ost 1st Diag-2 lesion was previously treated.  Second Diagonal Branch Vessel is small in size.  Third Diagonal Branch Vessel is small in size.  Left Circumflex Non-stenotic S[place]anne Jacquet  Cx lesion was previously treated. Prox Cx lesion is 40% stenosed. Mid Cx lesion is 20% stenosed.  First Obtuse Marginal Branch Vessel is small in size.  Second Obtuse Marginal Branch Vessel is small in size. Ost 2nd Mrg lesion is 70% stenosed.  Third Obtuse Marginal Branch Vessel is large in size.  Right Coronary Artery Vessel is moderate in size. Prox RCA lesion is 10% stenosed. The lesion was previously treated using a drug eluting stent between 6-12 months ago. Prox RCA to Mid RCA lesion is 30% stenosed. The lesion is calcified. Mid RCA to Dist RCA lesion is 75% stenosed.  LIMA LIMA Graft To Mid LAD LIMA. The graft is atretic. Origin to Mid Graft lesion is 99%  stenosed.  Saphenous Graft To Mid LAD SVG and is normal in caliber.  Intervention  Mid RCA to Dist RCA lesion Stent CATHETER LAUNCHER 6FR AL1 guide catheter was inserted. Lesion crossed with guidewire using a WIRE ASAHI PROWATER 180CM. Pre-stent angioplasty was performed using a BALLOON SAPPHIRE 2.25X15. A drug-eluting stent was successfully placed using a STENT RESOLUTE ONYX 2.5X34. Stent strut is well apposed. Stent does not overlap previously placed stentPost-stent angioplasty was performed using a BALLOON SAPPHIRE [place] 3.0X18. Maximum pressure:  18 atm. Atherectomy CATHETER LAUNCHER 6FR AL1 guide catheter was inserted. WIRE VIPERWIRE COR FLEX TIP guidewire was used to cross lesion. Orbital atherectomy was performed using a CROWN DIAMONDBACK CLASSIC 1.25. 4 passes taken. Post-Intervention Lesion Assessment The intervention was successful. Pre-interventional TIMI flow is 3. Post-intervention TIMI flow is 3. No complications occurred at this lesion. There is a 0% residual stenosis post intervention.   STRESS TESTS  NM PET CT CARDIAC PERFUSION MULTI W/ABSOLUTE BLOODFLOW 08/06/2022  Narrative   The study is normal. The study is low risk.   LV perfusion is normal. There is no evidence of ischemia. There is no evidence of infarction.   Rest left ventricular function is normal. Rest EF: 70 %. Stress left ventricular function is normal. Stress EF: 70 %. End diastolic cavity size is normal. End systolic cavity size is normal. No evidence of transient ischemic dilation (TID) noted.   Myocardial blood flow was computed to be 0.70ml/g/min at rest and 1.95ml/g/min at stress. Global myocardial blood flow reserve was 2.29 and was normal.   Coronary calcium assessment not performed due to prior revascularization. Aortic atherosclersosis seen.   Electronically signed by: Parke Poisson, MD  CLINICAL DATA:  This over-read does not include interpretation of cardiac or coronary anatomy or pathology. The  cardiac PET-CT interpretation by the cardiologist is attached.  COMPARISON:  None Available.  FINDINGS: Atherosclerotic calcifications in the thoracic aorta. Status post median sternotomy for CABG. Within the visualized portions of the thorax there are no suspicious appearing pulmonary nodules or masses, there is no acute consolidative airspace disease, no pleural effusions, no pneumothorax and no lymphadenopathy. Visualized portions of the upper abdomen are unremarkable. There are no aggressive appearing lytic or blastic lesions noted in the visualized portions of the skeleton.  IMPRESSION: 1.  Aortic Atherosclerosis (ICD10-I70.0).   Electronically Signed By: Trudie Reed M.D. On: 08/06/2022 08:59              Risk Assessment/Calculations:            Physical Exam:   VS:  BP (!) 152/78 (BP Location: Left Arm, Patient Position: Sitting, Cuff Size: Normal)   Pulse 79   Ht 5\' 4"  (1.626 m)   Wt 166 lb 6.4 oz (75.5 kg)  SpO2 99%   BMI 28.56 kg/m    Wt Readings from Last 3 Encounters:  08/27/22 166 lb 6.4 oz (75.5 kg)  07/02/22 164 lb 6.4 oz (74.6 kg)  04/22/22 163 lb (73.9 kg)    GEN: Well nourished, well developed in no acute distress NECK: No JVD; No carotid bruits CARDIAC: RRR, no murmurs, rubs, gallops RESPIRATORY:  Clear to auscultation without rales, wheezing or rhonchi  ABDOMEN: Soft, non-tender, non-distended EXTREMITIES:  No edema; No deformity   ASSESSMENT AND PLAN: .    CAD s/p CABG 2006: Recent PET stress test was negative.  No further workup for atypical chest pain.  Her primary concern is still bruising, she is no longer taking Pletal.  She is on aspirin and low-dose Brilinta.  She was supposed to take dual antiplatelet therapy for life, however given persistent bruising, may consider stop the aspirin.  Will discuss with Dr. Rennis Golden.  Hypertension: Blood pressure borderline elevated today however it was previously well-controlled.  Hyperlipidemia:  On Lipitor  DM 2: Followed by primary care provider.       Dispo: Follow-up with Dr. Rennis Golden in 6 months.  Signed, Azalee Course, PA

## 2022-09-05 ENCOUNTER — Telehealth: Payer: Self-pay | Admitting: Physician Assistant

## 2022-09-05 NOTE — Telephone Encounter (Signed)
I discussed Mrs. Mckain's bruising with Dr. Rennis Golden, one alternative is to stop Brilinta and change to either ASA or plavix monotherapy which has lower bleeding profile. I spoke with the patient who felt she was doing well on the Brilinta other than the bruising. She would rather be on the better drug for her heart and tolerate the mild bruising at this time. Will leave her on the current medication.

## 2022-10-13 ENCOUNTER — Ambulatory Visit (INDEPENDENT_AMBULATORY_CARE_PROVIDER_SITE_OTHER): Payer: 59 | Admitting: "Endocrinology

## 2022-10-13 VITALS — BP 136/78 | HR 56 | Ht 64.0 in | Wt 169.4 lb

## 2022-10-13 DIAGNOSIS — I10 Essential (primary) hypertension: Secondary | ICD-10-CM

## 2022-10-13 DIAGNOSIS — E782 Mixed hyperlipidemia: Secondary | ICD-10-CM

## 2022-10-13 DIAGNOSIS — E1159 Type 2 diabetes mellitus with other circulatory complications: Secondary | ICD-10-CM | POA: Diagnosis not present

## 2022-10-13 LAB — POCT GLYCOSYLATED HEMOGLOBIN (HGB A1C): HbA1c, POC (controlled diabetic range): 5.3 % (ref 0.0–7.0)

## 2022-10-13 NOTE — Patient Instructions (Signed)

## 2022-10-13 NOTE — Progress Notes (Signed)
10/13/2022, 11:07 AM            Endocrinology follow-up note  Subjective:    Patient ID: Tamara Nunez, female    DOB: [birth year].  Tamara Nunez is being seen in follow-up for type 2 diabetes which is currently controlled on diet and exercise with A1c of 5.5% improving from 6.3%.    PMD:   Theodoro Kos, MD.   Past Medical History:  Diagnosis Date   Arthritis    "in my back" (05/06/2017)   CAD (coronary artery disease)    CABG 2006, 09/2016 DES RCA, 04/2017 DES Circ, 06/2018 DES 1st diag   Chronic back pain    Coronary artery disease    a. s/p CABG in 2006 with LIMA-LAD, SVG-dLAD. b. cath in 2009 showing patent grafts. c. 09/2016: cath with occluded LAD but patent SVG-dLAD. LIMA was atretic. pRCA with 80% stenosis --> treated with DES   Fibromyalgia    GERD (gastroesophageal reflux disease)    Hyperlipidemia    Hypertension    Type II diabetes mellitus (HCC)     Past Surgical History:  Procedure Laterality Date   ABDOMINAL HERNIA REPAIR  2018   BACK SURGERY     CARDIAC CATHETERIZATION  01/13/2008   COLONOSCOPY     CORONARY ANGIOPLASTY WITH STENT PLACEMENT  09/26/2016   Prox RCA lesion, 80 %stenosed. DES    CORONARY ARTERY BYPASS GRAFT  12/23/2004   2 vessel - LIMA to LAD (atretic), SVG to distal LAD   CORONARY ATHERECTOMY N/A 08/16/2019   Procedure: CORONARY ATHERECTOMY;  Surgeon: Swaziland, Peter M, MD;  Location: [facility] INVASIVE CV LAB;  Service: Cardiovascular;  Laterality: N/A;   CORONARY STENT INTERVENTION N/A 09/26/2016   Procedure: Coronary Stent Intervention;  Surgeon: Marykay Lex, MD;  Location: [facility] INVASIVE CV LAB;  Service: Cardiovascular;  Laterality: N/A;   CORONARY STENT INTERVENTION N/A 05/06/2017   Procedure: CORONARY STENT INTERVENTION;  Surgeon: Kathleene Hazel, MD;  Location: [facility] INVASIVE CV LAB;  Service: Cardiovascular;  Laterality: N/A;   CORONARY STENT  INTERVENTION N/A 06/25/2018   Procedure: CORONARY STENT INTERVENTION;  Surgeon: Swaziland, Peter M, MD;  Location: [facility] INVASIVE CV LAB;  Service: Cardiovascular;  Laterality: N/A;   CORONARY STENT INTERVENTION N/A 08/12/2019   Procedure: CORONARY STENT INTERVENTION;  Surgeon: Lyn Records, MD;  Location: [facility] INVASIVE CV LAB;  Service: Cardiovascular;  Laterality: N/A;   CORONARY STENT INTERVENTION N/A 08/16/2019   Procedure: CORONARY STENT INTERVENTION;  Surgeon: Swaziland, Peter M, MD;  Location: [facility] INVASIVE CV LAB;  Service: Cardiovascular;  Laterality: N/A;   DOPPLER ECHOCARDIOGRAPHY  04/12/2008   EF 60%   GASTRIC BYPASS  2009   HERNIA REPAIR     LEFT HEART CATH AND CORONARY ANGIOGRAPHY N/A 05/06/2017   Procedure: LEFT HEART CATH AND CORONARY ANGIOGRAPHY;  Surgeon: Kathleene Hazel, MD;  Location: [facility] INVASIVE CV LAB;  Service: Cardiovascular;  Laterality: N/A;   LEFT HEART CATH AND CORS/GRAFTS ANGIOGRAPHY N/A 09/26/2016   Procedure: Left Heart Cath and Cors/Grafts Angiography;  Surgeon: Marykay Lex, MD;  Location: [facility] INVASIVE  CV LAB;  Service: Cardiovascular;  Laterality: N/A;   LEFT HEART CATH AND CORS/GRAFTS ANGIOGRAPHY N/A 06/25/2018   Procedure: LEFT HEART CATH AND CORS/GRAFTS ANGIOGRAPHY;  Surgeon: Swaziland, Peter M, MD;  Location: [facility] INVASIVE CV LAB;  Service: Cardiovascular;  Laterality: N/A;   LEFT HEART CATH AND CORS/GRAFTS ANGIOGRAPHY N/A 08/12/2019   Procedure: LEFT HEART CATH AND CORS/GRAFTS ANGIOGRAPHY;  Surgeon: Lyn Records, MD;  Location: [facility] INVASIVE CV LAB;  Service: Cardiovascular;  Laterality: N/A;   LEFT HEART CATH AND CORS/GRAFTS ANGIOGRAPHY N/A 03/08/2020   Procedure: LEFT HEART CATH AND CORS/GRAFTS ANGIOGRAPHY;  Surgeon: Swaziland, Peter M, MD;  Location: [facility] INVASIVE CV LAB;  Service: Cardiovascular;  Laterality: N/A;   NM MYOVIEW LTD  06/03/2012   EF 71%   POSTERIOR FUSION LUMBAR SPINE  1990s   TOTAL KNEE ARTHROPLASTY Left 02/26/2021   Procedure: TOTAL KNEE ARTHROPLASTY;   Surgeon: Durene Romans, MD;  Location: [facility] ORS;  Service: Orthopedics;  Laterality: Left;   TOTAL KNEE ARTHROPLASTY Right 08/13/2021   Procedure: TOTAL KNEE ARTHROPLASTY;  Surgeon: Durene Romans, MD;  Location: [facility] ORS;  Service: Orthopedics;  Laterality: Right;   TUBAL LIGATION     VAGINAL HYSTERECTOMY     "partial"    Social History   Socioeconomic History   Marital status: Married    Spouse name: Not on file   Number of children: Not on file   Years of education: Not on file   Highest education level: Not on file  Occupational History   Not on file  Tobacco Use   Smoking status: Never   Smokeless tobacco: Never  Vaping Use   Vaping status: Never Used  Substance and Sexual Activity   Alcohol use: No   Drug use: No   Sexual activity: Not on file  Other Topics Concern   Not on file  Social History Narrative   Not on file   Social Determinants of Health   Financial Resource Strain: Not on file  Food Insecurity: Not on file  Transportation Needs: Not on file  Physical Activity: Not on file  Stress: Not on file  Social Connections: Not on file    Family History  Problem Relation Age of Onset   Heart disease Mother    Cancer - Lung Father     Outpatient Encounter Medications as of 10/13/2022  Medication Sig   Accu-Chek Softclix Lancets lancets USE TO TEST BLOOD GLUCOSE TWICE  DAILY   acetaminophen (TYLENOL) 500 MG tablet Take 2 tablets (1,000 mg total) by mouth every 6 (six) hours.   albuterol (VENTOLIN HFA) 108 (90 Base) MCG/ACT inhaler    amLODipine (NORVASC) 2.5 MG tablet Take 1 tablet (2.5 mg total) by mouth daily.   aspirin EC 81 MG tablet Take 81 mg by mouth daily.   atorvastatin (LIPITOR) 40 MG tablet Take 1 tablet (40 mg total) by mouth daily.   Blood Glucose Monitoring Suppl (ACCU-CHEK GUIDE ME) w/Device KIT Use to test BG qid   BRILINTA 60 MG TABS tablet TAKE 1 TABLET BY MOUTH TWICE  DAILY   cephALEXin (KEFLEX) 500 MG capsule Take 1 capsule (500 mg total) by  mouth 3 (three) times daily.   cetirizine (ZYRTEC) 10 MG tablet Take 10 mg by mouth daily.   Cholecalciferol 50 MCG (2000 UT) CAPS Take 1 capsule (2,000 Units total) by mouth daily with breakfast.   Coenzyme Q10 (CO Q-10) 200 MG CAPS Take 200 mg by mouth daily.   Cyanocobalamin (VITAMIN B-12 PO) Take 3  tablets by mouth daily.   cyclobenzaprine (FLEXERIL) 10 MG tablet Take 1 tablet (10 mg total) by mouth 3 (three) times daily as needed for muscle spasms.   diclofenac sodium (VOLTAREN) 1 % GEL Apply 1 application. topically in the morning and at bedtime.   dicyclomine (BENTYL) 10 MG capsule Take 10 mg by mouth every 6 (six) hours as needed for spasms.   Docusate Calcium (STOOL SOFTENER PO) Take 1 capsule by mouth daily.   EPINEPHrine 0.3 mg/0.3 mL IJ SOAJ injection Inject 0.3 mg into the muscle as needed for anaphylaxis.   ferrous sulfate 325 (65 FE) MG tablet Take 1 tablet (325 mg total) by mouth 3 (three) times daily after meals for 14 days.   Fingerstix Lancets MISC Inject into the skin.   fluticasone (FLONASE) 50 MCG/ACT nasal spray Place 1 spray into both nostrils 2 (two) times daily.   furosemide (LASIX) 40 MG tablet Take 40 mg by mouth daily as needed for fluid or edema.   gabapentin (NEURONTIN) 600 MG tablet Take 600 mg by mouth in the morning, at noon, in the evening, and at bedtime.   glucose blood (ACCU-CHEK GUIDE) test strip USE TO CHECK BLOOD GLUCOSE DAILY AS DIRECTED   glucose blood (ACCU-CHEK GUIDE) test strip for testing as directed Dx E11.65   glucose blood (ACCU-CHEK GUIDE) test strip 1 each by Other route in the morning and at bedtime. Test BG bid. Dx E11.65   Iron-Vitamin C (VITRON-C PO) Take 1 tablet by mouth daily.   isosorbide mononitrate (IMDUR) 60 MG 24 hr tablet Take 1 tablet (60 mg total) by mouth daily.   lidocaine (LIDODERM) 5 % Place 1 patch onto the skin daily as needed. Remove & Discard patch within 12 hours or as directed by MD (Patient taking differently: Place 2  patches onto the skin See admin instructions. Put 2 patches on back 1-2 times a day as needed for pain)   methocarbamol (ROBAXIN) 500 MG tablet Take 1 tablet (500 mg total) by mouth every 8 (eight) hours as needed for muscle spasms.   metoprolol tartrate (LOPRESSOR) 50 MG tablet Take 1 tablet (50 mg total) by mouth 2 (two) times daily.   Multiple Vitamin (MULTIVITAMIN WITH MINERALS) TABS tablet Take 2 tablets by mouth daily.   nitroGLYCERIN (NITROSTAT) 0.4 MG SL tablet Place 1 tablet (0.4 mg total) under the tongue every 5 (five) minutes as needed for chest pain (up to 3 doses). (Patient taking differently: Place 0.4 mg under the tongue every 5 (five) minutes as needed for chest pain.)   pantoprazole (PROTONIX) 40 MG tablet Take 40 mg by mouth 2 (two) times daily as needed (acid reflux).   polyethylene glycol (MIRALAX / GLYCOLAX) 17 g packet Take 17 g by mouth daily as needed for mild constipation.   ranolazine (RANEXA) 1000 MG SR tablet Take 1 tablet (1,000 mg total) by mouth 2 (two) times daily.   traMADol (ULTRAM) 50 MG tablet Take 50 mg by mouth as needed.   vitamin E 400 UNIT capsule Take 400 Units by mouth daily.   Facility-Administered Encounter Medications as of 10/13/2022  Medication   sodium chloride flush (NS) 0.9 % injection 3 mL    ALLERGIES: Allergies  Allergen Reactions   Codeine Hives and Shortness Of Breath        Other Anaphylaxis    Nuts   Ciprofloxacin Itching   Valsartan Rash   Adhesive [Tape] Itching    "clear tape" used after surgery."took all her skin  off".  Paper take is ok   Sulfa Antibiotics Nausea Only    VACCINATION STATUS: Immunization History  Administered Date(s) Administered   Moderna Sars-Covid-2 Vaccination 04/23/2019, 05/21/2019, 12/30/2019, 07/07/2020    Diabetes She presents for her follow-up diabetic visit. She has type 2 diabetes mellitus. Onset time: She was diagnosed at approximate age of 34 years. Her disease course has been improving.  There are no hypoglycemic associated symptoms. Pertinent negatives for hypoglycemia include no confusion, headaches, pallor or seizures. Pertinent negatives for diabetes include no chest pain, no fatigue, no polydipsia, no polyphagia and no polyuria. There are no hypoglycemic complications. Symptoms are improving. Diabetic complications include heart disease. Risk factors for coronary artery disease include dyslipidemia, diabetes mellitus, hypertension, sedentary lifestyle, post-menopausal, obesity and family history. When asked about current treatments, none were reported. Her weight is fluctuating minimally. She is following a generally unhealthy diet. When asked about meal planning, she reported none. She has not had a previous visit with a dietitian. She never participates in exercise. Her home blood glucose trend is decreasing steadily. Her breakfast blood glucose range is generally 130-140 mg/dl. Her overall blood glucose range is 130-140 mg/dl. (She presents with controlled glycemic profile averaging 137 mg per DL.  Her point-of-care A1c is 5.3%, progressively improving.  She is not on any medications to manage diabetes at this time.  ) An ACE inhibitor/angiotensin II receptor blocker is being taken. Eye exam is current.  Hyperlipidemia This is a chronic problem. The current episode started more than 1 year ago. The problem is controlled. Exacerbating diseases include diabetes. Pertinent negatives include no chest pain, myalgias or shortness of breath. Risk factors for coronary artery disease include dyslipidemia, diabetes mellitus, hypertension, a sedentary lifestyle and post-menopausal.  Hypertension This is a chronic problem. The problem is controlled. Pertinent negatives include no chest pain, headaches, palpitations or shortness of breath. Risk factors for coronary artery disease include dyslipidemia, diabetes mellitus, sedentary lifestyle, family history and post-menopausal state. Past treatments  include ACE inhibitors. Hypertensive end-organ damage includes CAD/MI.    Review of systems: Limited as above.  Objective:    BP 136/78   Pulse (!) 56   Ht 5\' 4"  (1.626 m)   Wt 169 lb 6.4 oz (76.8 kg)   BMI 29.08 kg/m   Wt Readings from Last 3 Encounters:  10/13/22 169 lb 6.4 oz (76.8 kg)  08/27/22 166 lb 6.4 oz (75.5 kg)  07/02/22 164 lb 6.4 oz (74.6 kg)     Physical Exam- Limited  Constitutional:  Body mass index is 29.08 kg/m. , not in acute distress, normal state of mind   CMP     Component Value Date/Time   NA 144 04/10/2022 1209   K 4.2 04/10/2022 1209   CL 105 04/10/2022 1209   CO2 17 (L) 04/10/2022 1209   GLUCOSE 79 04/10/2022 1209   GLUCOSE 73 10/21/2021 0043   BUN 13 04/10/2022 1209   CREATININE 0.88 04/10/2022 1209   CREATININE 0.94 10/18/2018 1000   CALCIUM 9.5 04/10/2022 1209   PROT 7.6 04/10/2022 1209   ALBUMIN 4.6 04/10/2022 1209   AST 46 (H) 04/10/2022 1209   ALT 53 (H) 04/10/2022 1209   ALKPHOS 143 (H) 04/10/2022 1209   BILITOT 0.6 04/10/2022 1209   GFRNONAA >60 10/21/2021 0043   GFRNONAA 64 10/18/2018 1000   GFRAA 82 03/05/2020 1207   GFRAA 74 10/18/2018 1000    Diabetic Labs (most recent): Lab Results  Component Value Date   HGBA1C 5.3 10/13/2022  HGBA1C 5.7 04/22/2022   HGBA1C 5.5 10/21/2021   MICROALBUR 1.04 04/27/2019     Lipid Panel ( most recent) Lipid Panel     Component Value Date/Time   CHOL 149 04/10/2022 1209   TRIG 50 04/10/2022 1209   HDL 87 04/10/2022 1209   CHOLHDL 1.7 04/10/2022 1209   CHOLHDL 1.8 04/15/2010 0400   VLDL 3 04/15/2010 0400   LDLCALC 51 04/10/2022 1209      Lab Results  Component Value Date   TSH 0.698 04/10/2022   TSH 1.440 01/14/2021   TSH 0.80 01/11/2020   TSH 1.16 04/27/2019   TSH 1.47 05/12/2018   TSH 1.540 01/01/2018   TSH 1.268 04/14/2010   FREET4 1.33 04/10/2022   FREET4 1.15 01/14/2021      Assessment & Plan:   1. DM type 2 causing vascular disease (HCC)   - Tamara Nunez has currently controlled  type 2 DM since  67 years of age.  She presents with controlled glycemic profile averaging 137 mg per DL.  Her point-of-care A1c is 5.3%, progressively improving.  She is not on any medications to manage diabetes at this time.   -her diabetes is complicated by coronary artery disease, sedentary life, and she remains at a high risk for more acute and chronic complications which include CAD, CVA, CKD, retinopathy, and neuropathy. These are all discussed in detail with her.  - I have counseled her on diet management and weight loss, by adopting a carbohydrate restricted/protein rich diet.  She is engaged and remains a beneficiary of lifestyle medicine.  - Suggestion is made for her to avoid simple carbohydrates  from her diet including Cakes, Sweet Desserts, Ice Cream, Soda (diet and regular), Sweet Tea, Candies, Chips, Cookies, Store Bought Juices, Alcohol , Artificial Sweeteners,  Coffee Creamer, and "Sugar-free" Products, Lemonade. This will help patient to have more stable blood glucose profile and potentially avoid unintended weight gain.  The following Lifestyle Medicine recommendations according to American College of Lifestyle Medicine  [facility]) were discussed and and offered to patient and she  agrees to start the journey:  A. Whole Foods, Plant-Based Nutrition comprising of fruits and vegetables, plant-based proteins, whole-grain carbohydrates was discussed in detail with the patient.   A list for source of those nutrients were also provided to the patient.  Patient will use only water or unsweetened tea for hydration.   B.  The need to stay away from risky substances including alcohol, smoking; obtaining 7 to 9 hours of restorative sleep, at least 150 minutes of moderate intensity exercise weekly, the importance of healthy social connections,  and stress management techniques were discussed.  - she is advised to stick to a routine mealtimes to eat 3 meals  a  day and avoid unnecessary snacks ( to snack only to correct hypoglycemia).  - I have approached her with the following individualized plan to manage  her diabetes and patient agrees:   -In light of her presentation was tightly controlled glycemic Profile and point-of-care A1c of 5.3% in the absence of anemia with hemoglobin of 12.9, she would not need any medications at this time.   She is willing to continue monitoring blood glucose twice a day and encouraged to do so.  She will be testing before breakfast and at bedtime and report if readings are less than 70 or greater than 200 mg per DL.    She was previously treated with Starlix which was discontinued due to random, mild hypoglycemia.  If she needs intervention, she will be considered for low dose metformin.  - Patient specific target  A1c;  LDL, HDL, Triglycerides, were discussed in detail.  2) Blood Pressure /Hypertension: Her blood pressure is controlled to target. -   she is advised to continue her current medications including  Ramipril 5 mg p.o. daily with breakfast .  3) Lipids/Hyperlipidemia:   Review of her recent lipid panel showed controlled LDL of  51. She is advised to continue Lipitor 40 mg p.o. nightly.  Side effects and precautions discussed with her.    4)  Weight/Diet: Her BMI is 29.08---she is a candidate for some weight loss.   I discussed with her the fact that loss of 5 - 10% of her  current body weight will have the most impact on her diabetes management.  CDE Consult will be initiated . Exercise, and detailed carbohydrates information provided  -  detailed on discharge instructions.  5) vitamin D deficiency: -Her 25-hydroxy vitamin D has dropped to 27.9 from 31.1.    She has stopped her vitamin D supplements.  She is advised to continue vitamin D3 2000 units daily.    6) Chronic Care/Health Maintenance:  -she  is on ACEI/ARB and Statin medications and  is encouraged to initiate and continue to follow up  with Ophthalmology, Dentist,  Podiatrist at least yearly or according to recommendations, and advised to   stay away from smoking. I have recommended yearly flu vaccine and pneumonia vaccine at least every 5 years; moderate intensity exercise for up to 150 minutes weekly; and  sleep for at least 7 hours a day.  - she is  advised to maintain close follow up with Theodoro Kos, MD for primary care needs, as well as her other providersfor optimal and coordinated care.   I spent  25  minutes in the care of the patient today including review of labs from Thyroid Function, glycemic profile, CMP, and other relevant labs ; imaging/biopsy records (current and previous including abstractions from other facilities); face-to-face time discussing  her lab results and symptoms, medications doses, her options of short and long term treatment based on the latest standards of care / guidelines;   and documenting the encounter.  Tamara Nunez  participated in the discussions, expressed understanding, and voiced agreement with the above plans.  All questions were answered to her satisfaction. she is encouraged to contact clinic should she have any questions or concerns prior to her return visit.   Follow up plan: - Return in about 1 year (around 10/13/2023) for Fasting Labs  in AM B4 8.  Marquis Lunch, MD [facility] [address] [place], [place] [address] Phone: [phone]  Fax: [phone]    10/13/2022, 11:07 AM  This note was partially dictated with voice recognition software. Similar sounding words can be transcribed inadequately or may not  be corrected upon review.

## 2022-12-27 ENCOUNTER — Emergency Department (HOSPITAL_COMMUNITY): Payer: 59

## 2022-12-27 ENCOUNTER — Emergency Department (HOSPITAL_COMMUNITY)
Admission: EM | Admit: 2022-12-27 | Discharge: 2022-12-28 | Disposition: A | Discharge status: Home / Self Care | Payer: 59 | Attending: Emergency Medicine | Admitting: Emergency Medicine

## 2022-12-27 ENCOUNTER — Other Ambulatory Visit: Payer: Self-pay

## 2022-12-27 ENCOUNTER — Encounter (HOSPITAL_COMMUNITY): Payer: Self-pay

## 2022-12-27 DIAGNOSIS — Z7982 Long term (current) use of aspirin: Secondary | ICD-10-CM | POA: Insufficient documentation

## 2022-12-27 DIAGNOSIS — R39198 Other difficulties with micturition: Secondary | ICD-10-CM | POA: Insufficient documentation

## 2022-12-27 DIAGNOSIS — R109 Unspecified abdominal pain: Secondary | ICD-10-CM | POA: Insufficient documentation

## 2022-12-27 DIAGNOSIS — M545 Low back pain, unspecified: Secondary | ICD-10-CM

## 2022-12-27 LAB — URINALYSIS, ROUTINE W REFLEX MICROSCOPIC
Bacteria, UA: NONE SEEN
Bilirubin Urine: NEGATIVE
Glucose, UA: NEGATIVE mg/dL
Hgb urine dipstick: NEGATIVE
Ketones, ur: 5 mg/dL — AB
Leukocytes,Ua: NEGATIVE
Nitrite: NEGATIVE
Protein, ur: 30 mg/dL — AB
Specific Gravity, Urine: 1.016 (ref 1.005–1.030)
pH: 5 (ref 5.0–8.0)

## 2022-12-27 LAB — CBC WITH DIFFERENTIAL/PLATELET
Abs Immature Granulocytes: 0.03 10*3/uL (ref 0.00–0.07)
Basophils Absolute: 0 10*3/uL (ref 0.0–0.1)
Basophils Relative: 1 %
Eosinophils Absolute: 0 10*3/uL (ref 0.0–0.5)
Eosinophils Relative: 0 %
HCT: 42.4 % (ref 36.0–46.0)
Hemoglobin: 13.8 g/dL (ref 12.0–15.0)
Immature Granulocytes: 1 %
Lymphocytes Relative: 14 %
Lymphs Abs: 0.9 10*3/uL (ref 0.7–4.0)
MCH: 29.4 pg (ref 26.0–34.0)
MCHC: 32.5 g/dL (ref 30.0–36.0)
MCV: 90.4 fL (ref 80.0–100.0)
Monocytes Absolute: 0.2 10*3/uL (ref 0.1–1.0)
Monocytes Relative: 4 %
Neutro Abs: 4.7 10*3/uL (ref 1.7–7.7)
Neutrophils Relative %: 80 %
Platelets: 297 10*3/uL (ref 150–400)
RBC: 4.69 MIL/uL (ref 3.87–5.11)
RDW: 13.4 % (ref 11.5–15.5)
WBC: 5.9 10*3/uL (ref 4.0–10.5)
nRBC: 0 % (ref 0.0–0.2)

## 2022-12-27 LAB — BASIC METABOLIC PANEL
Anion gap: 11 (ref 5–15)
BUN: 15 mg/dL (ref 8–23)
CO2: 20 mmol/L — ABNORMAL LOW (ref 22–32)
Calcium: 9.4 mg/dL (ref 8.9–10.3)
Chloride: 105 mmol/L (ref 98–111)
Creatinine, Ser: 0.92 mg/dL (ref 0.44–1.00)
GFR, Estimated: >60 mL/min (ref >60)
Glucose, Bld: 116 mg/dL — ABNORMAL HIGH (ref 70–99)
Potassium: 4 mmol/L (ref 3.5–5.1)
Sodium: 136 mmol/L (ref 135–145)

## 2022-12-27 MED ORDER — HYDROMORPHONE HCL 1 MG/ML IJ SOLN
1.0000 mg | Freq: Once | INTRAMUSCULAR | Status: AC
  Administered 2022-12-27: 1 mg via INTRAMUSCULAR
  Filled 2022-12-27: qty 1

## 2022-12-27 MED ORDER — OXYCODONE-ACETAMINOPHEN 5-325 MG PO TABS
1.0000 | ORAL_TABLET | Freq: Once | ORAL | Status: AC
  Administered 2022-12-27: 1 via ORAL
  Filled 2022-12-27: qty 1

## 2022-12-27 MED ORDER — KETOROLAC TROMETHAMINE 15 MG/ML IJ SOLN
15.0000 mg | Freq: Once | INTRAMUSCULAR | Status: AC
  Administered 2022-12-28: 15 mg via INTRAMUSCULAR
  Filled 2022-12-27: qty 1

## 2022-12-27 MED ORDER — METHYLPREDNISOLONE SODIUM SUCC 125 MG IJ SOLR
125.0000 mg | Freq: Once | INTRAMUSCULAR | Status: AC
  Administered 2022-12-28: 125 mg via INTRAMUSCULAR
  Filled 2022-12-27: qty 2

## 2022-12-27 NOTE — ED Provider Triage Note (Signed)
Emergency Medicine Provider Triage Evaluation Note  Tamara Nunez , a 67 y.o. female  was evaluated in triage.  Pt complains of left flank pain radiating to buttocks and left legx3 days. Went to ED yesterday OSH and did CT and results and results said that pt had findings concerning for subacute or incompletely healed discitis/osteomyelitis within differential of T11 and T12. Reports now urinating onself in bed. No fevers or chills.   Review of Systems  Positive: Back pain Negative: fevers  Physical Exam  BP (!) 161/79 (BP Location: Right Arm)   Pulse 74   Temp 98.4 F (36.9 C) (Oral)   Resp 19   SpO2 94%  Gen:   Awake, no distress   Resp:  Normal effort  MSK:   Moves extremities without difficulty  Other:  Ttp of midline low thoracic, upper lumbar spine, negative SLR  Medical Decision Making  Medically screening exam initiated at 2:07 PM.  Appropriate orders placed.  Tamara Nunez was informed that the remainder of the evaluation will be completed by another provider, this initial triage assessment does not replace that evaluation, and the importance of remaining in the ED until their evaluation is complete.    Pete Pelt, [place] 12/27/22 1414

## 2022-12-27 NOTE — ED Triage Notes (Signed)
Pt c/o left flank pain and frequnet urinationx1.5wks

## 2022-12-27 NOTE — Discharge Instructions (Signed)
Continue pain medication as prescribed.

## 2022-12-27 NOTE — ED Notes (Signed)
Patient transferred to MRI 

## 2022-12-28 NOTE — ED Provider Notes (Signed)
[place] EMERGENCY DEPARTMENT AT [facility] Provider Note   CSN: [id number] Arrival date & time: 12/27/22  1340     History  Chief Complaint  Patient presents with   Flank Pain    Tamara Nunez is a 67 y.o. female.  Patient complains of low back pain.  Patient saw Dr. Wynetta Emery 2 days ago and had CTs of her back.  Patient presents to the emergency department complaining of difficulty controlling her urine.  Patient reports that she has had the symptoms for over a week.  Patient reports that she has the urge to go and sometimes cannot make it to the bathroom in time.  Patient denies any fever or chills she denies any burning with urination.  Patient denies any abdominal pain.  Patient complains of severe pain in her back.  Patient has a history of having had osteomyelitis to her spine.  Patient has had a lumbar fusion.    The history is provided by the patient. No language interpreter was used.  Flank Pain       Home Medications Prior to Admission medications   Medication Sig Start Date End Date Taking? Authorizing Provider  Accu-Chek Softclix Lancets lancets USE TO TEST BLOOD GLUCOSE TWICE  DAILY 03/21/22   Roma Kayser, MD  acetaminophen (TYLENOL) 500 MG tablet Take 2 tablets (1,000 mg total) by mouth every 6 (six) hours. 08/14/21   Cassandria Anger, PA-C  albuterol (VENTOLIN HFA) 108 (90 Base) MCG/ACT inhaler     [provider]  amLODipine (NORVASC) 2.5 MG tablet Take 1 tablet (2.5 mg total) by mouth daily. 08/26/19 12/02/21  Azalee Course, PA  aspirin EC 81 MG tablet Take 81 mg by mouth daily.    [provider]  atorvastatin (LIPITOR) 40 MG tablet Take 1 tablet (40 mg total) by mouth daily. 08/17/19   Dunn, Tacey Ruiz, PA-C  Blood Glucose Monitoring Suppl (ACCU-CHEK GUIDE ME) w/Device KIT Use to test BG qid 08/06/21   Roma Kayser, MD  BRILINTA 60 MG TABS tablet TAKE 1 TABLET BY MOUTH TWICE  DAILY 05/28/22   Hilty, Lisette Abu, MD  cephALEXin  (KEFLEX) 500 MG capsule Take 1 capsule (500 mg total) by mouth 3 (three) times daily. 10/21/21   Rancour, Jeannett Senior, MD  cetirizine (ZYRTEC) 10 MG tablet Take 10 mg by mouth daily.    [provider]  Cholecalciferol 50 MCG (2000 UT) CAPS Take 1 capsule (2,000 Units total) by mouth daily with breakfast. 10/21/21   Nida, Denman George, MD  Coenzyme Q10 (CO Q-10) 200 MG CAPS Take 200 mg by mouth daily.    [provider]  Cyanocobalamin (VITAMIN B-12 PO) Take 3 tablets by mouth daily.    [provider]  cyclobenzaprine (FLEXERIL) 10 MG tablet Take 1 tablet (10 mg total) by mouth 3 (three) times daily as needed for muscle spasms. 08/14/21   Cassandria Anger, PA-C  diclofenac sodium (VOLTAREN) 1 % GEL Apply 1 application. topically in the morning and at bedtime. 10/31/18   [provider]  dicyclomine (BENTYL) 10 MG capsule Take 10 mg by mouth every 6 (six) hours as needed for spasms. 01/06/20   [provider]  Docusate Calcium (STOOL SOFTENER PO) Take 1 capsule by mouth daily.    [provider]  EPINEPHrine 0.3 mg/0.3 mL IJ SOAJ injection Inject 0.3 mg into the muscle as needed for anaphylaxis. 05/20/21   [provider]  ferrous sulfate 325 (65 FE) MG  tablet Take 1 tablet (325 mg total) by mouth 3 (three) times daily after meals for 14 days. 08/14/21 12/02/21  Cassandria Anger, PA-C  Fingerstix Lancets MISC Inject into the skin. 03/23/19   [provider]  fluticasone (FLONASE) 50 MCG/ACT nasal spray Place 1 spray into both nostrils 2 (two) times daily.    [provider]  furosemide (LASIX) 40 MG tablet Take 40 mg by mouth daily as needed for fluid or edema.    [provider]  gabapentin (NEURONTIN) 600 MG tablet Take 600 mg by mouth in the morning, at noon, in the evening, and at bedtime. 09/07/17   [provider]  glucose blood (ACCU-CHEK GUIDE) test strip USE TO CHECK BLOOD GLUCOSE DAILY AS DIRECTED 04/14/22    Nida, Denman George, MD  glucose blood (ACCU-CHEK GUIDE) test strip for testing as directed Dx E11.65 07/22/22   Roma Kayser, MD  glucose blood (ACCU-CHEK GUIDE) test strip 1 each by Other route in the morning and at bedtime. Test BG bid. Dx E11.65 07/23/22   Roma Kayser, MD  Iron-Vitamin C (VITRON-C PO) Take 1 tablet by mouth daily.    [provider]  isosorbide mononitrate (IMDUR) 60 MG 24 hr tablet Take 1 tablet (60 mg total) by mouth daily. 07/02/22   Azalee Course, PA  lidocaine (LIDODERM) 5 % Place 1 patch onto the skin daily as needed. Remove & Discard patch within 12 hours or as directed by MD Patient taking differently: Place 2 patches onto the skin See admin instructions. Put 2 patches on back 1-2 times a day as needed for pain 03/04/21   Tanda Rockers A, DO  methocarbamol (ROBAXIN) 500 MG tablet Take 1 tablet (500 mg total) by mouth every 8 (eight) hours as needed for muscle spasms. 10/21/21   Rancour, Jeannett Senior, MD  metoprolol tartrate (LOPRESSOR) 50 MG tablet Take 1 tablet (50 mg total) by mouth 2 (two) times daily. 06/13/20   Azalee Course, PA  Multiple Vitamin (MULTIVITAMIN WITH MINERALS) TABS tablet Take 2 tablets by mouth daily.    [provider]  nitroGLYCERIN (NITROSTAT) 0.4 MG SL tablet Place 1 tablet (0.4 mg total) under the tongue every 5 (five) minutes as needed for chest pain (up to 3 doses). Patient taking differently: Place 0.4 mg under the tongue every 5 (five) minutes as needed for chest pain. 08/17/19   Dunn, Tacey Ruiz, PA-C  pantoprazole (PROTONIX) 40 MG tablet Take 40 mg by mouth 2 (two) times daily as needed (acid reflux).    [provider]  polyethylene glycol (MIRALAX / GLYCOLAX) 17 g packet Take 17 g by mouth daily as needed for mild constipation. 02/27/21   Cassandria Anger, PA-C  ranolazine (RANEXA) 1000 MG SR tablet Take 1 tablet (1,000 mg total) by mouth 2 (two) times daily. 06/16/13   Hilty, Lisette Abu, MD  traMADol (ULTRAM) 50 MG  tablet Take 50 mg by mouth as needed. 11/20/21   [provider]  vitamin E 400 UNIT capsule Take 400 Units by mouth daily.    [provider]      Allergies    Codeine, Other, Ciprofloxacin, Valsartan, Adhesive [tape], and Sulfa antibiotics    Review of Systems   Review of Systems  Genitourinary:  Positive for flank pain.  All other systems reviewed and are negative.   Physical Exam Updated Vital Signs BP (!) 169/64   Pulse 62   Temp 98.5 F (36.9 C) (Oral)   Resp 18  Ht 5\' 4"  (1.626 m)   Wt 76.8 kg   SpO2 98%   BMI 29.06 kg/m  Physical Exam Vitals and nursing note reviewed.  Constitutional:      Appearance: She is well-developed.  HENT:     Head: Normocephalic.  Cardiovascular:     Rate and Rhythm: Normal rate.  Pulmonary:     Effort: Pulmonary effort is normal.  Abdominal:     General: There is no distension.  Musculoskeletal:     Cervical back: Normal range of motion.     Comments: Tender thoracic and lumbar spine to palpation,  pain with range of motion nv and ns intact   Skin:    General: Skin is warm.  Neurological:     General: No focal deficit present.     Mental Status: She is alert and oriented to person, place, and time.     ED Results / Procedures / Treatments   Labs (all labs ordered are listed, but only abnormal results are displayed) Labs Reviewed  BASIC METABOLIC PANEL - Abnormal; Notable for the following components:      Result Value   CO2 20 (*)    Glucose, Bld 116 (*)    All other components within normal limits  URINALYSIS, ROUTINE W REFLEX MICROSCOPIC - Abnormal; Notable for the following components:   Ketones, ur 5 (*)    Protein, ur 30 (*)    All other components within normal limits  CBC WITH DIFFERENTIAL/PLATELET    EKG None  Radiology MR THORACIC SPINE WO CONTRAST  Result Date: 12/27/2022 CLINICAL DATA:  Mid-back pain now w/loss of urination and concern for osteomyelitis; Low back pain, cauda equina  syndrome suspected EXAM: MRI THORACIC AND LUMBAR SPINE WITHOUT CONTRAST TECHNIQUE: Multiplanar and multiecho pulse sequences of the thoracic and lumbar spine were obtained without intravenous contrast. COMPARISON:  MRI 10/21/2021, CT 05/27/2022 FINDINGS: MRI THORACIC SPINE FINDINGS Alignment: Mildly exaggerated thoracic kyphosis. No static listhesis. Vertebrae: Fluid signal within the T11-T12 disc space. Chronic erosive endplate changes at T11 and T12, more pronounced anteriorly. Fluid signal is seen extending into the posterior aspect of the T12 vertebral body. No significant bone marrow edema within either vertebrae. No paravertebral edema or fluid. No additional sites of osteomyelitis. No acute fracture. Scattered intraosseous hemangiomas. No suspicious bone lesion. Cord:  Normal signal and morphology.  No epidural space abnormality. Paraspinal and other soft tissues: Negative. No paravertebral fluid collection. Disc levels: Endplate spurring and bilateral facet arthropathy at the T11-T12 level result in mild canal stenosis and severe bilateral foraminal stenosis. No significant foraminal or canal stenosis at any other level within the thoracic spine. MRI LUMBAR SPINE FINDINGS Segmentation:  Standard. Alignment: Grade 1 anterolisthesis of L3 on L4, and to a lesser degree at L2-3. Vertebrae: Prior fusion and decompression at L2-L5. Reactive discogenic endplate changes at L1-2. No evidence of discitis within the lumbar spine. No fracture. Chronic fusion across the L5-S1 disc space. Conus medullaris and cauda equina: Conus extends to the L1-2 level. Conus and cauda equina appear normal. Paraspinal and other soft tissues: Negative. Disc levels: T12-L1: No disc protrusion. Mild bilateral facet arthropathy. No foraminal or canal stenosis. L1-L2: Diffuse disc bulge with cranially extending disc extrusion in the left subarticular zone. Advanced bilateral facet arthropathy with ligamentum flavum buckling. Severe canal  stenosis with moderate-severe bilateral foraminal stenosis. Findings have progressed from prior. L2-L3 through L4-L5: Prior fusion and decompression without foraminal or canal stenosis. L5-S1: Fused disc space. No canal stenosis. Chronic mild  bilateral foraminal stenosis. IMPRESSION: 1. Findings of low-grade or indolent discitis-osteomyelitis at T11-12. No paravertebral edema or fluid. No epidural abscess. Correlate for signs of systemic infection including serum inflammatory markers. 2. No additional sites of discitis within the thoracic or lumbar spine. 3. Prior fusion and decompression at L2-L5 without foraminal or canal stenosis at these levels. 4. Progressive adjacent segment disease at L1-L2 with severe canal stenosis and moderate-severe bilateral foraminal stenosis. Electronically Signed   By: Duanne Guess D.O.   On: 12/27/2022 22:04   MR LUMBAR SPINE WO CONTRAST  Result Date: 12/27/2022 CLINICAL DATA:  Mid-back pain now w/loss of urination and concern for osteomyelitis; Low back pain, cauda equina syndrome suspected EXAM: MRI THORACIC AND LUMBAR SPINE WITHOUT CONTRAST TECHNIQUE: Multiplanar and multiecho pulse sequences of the thoracic and lumbar spine were obtained without intravenous contrast. COMPARISON:  MRI 10/21/2021, CT 05/27/2022 FINDINGS: MRI THORACIC SPINE FINDINGS Alignment: Mildly exaggerated thoracic kyphosis. No static listhesis. Vertebrae: Fluid signal within the T11-T12 disc space. Chronic erosive endplate changes at T11 and T12, more pronounced anteriorly. Fluid signal is seen extending into the posterior aspect of the T12 vertebral body. No significant bone marrow edema within either vertebrae. No paravertebral edema or fluid. No additional sites of osteomyelitis. No acute fracture. Scattered intraosseous hemangiomas. No suspicious bone lesion. Cord:  Normal signal and morphology.  No epidural space abnormality. Paraspinal and other soft tissues: Negative. No paravertebral fluid  collection. Disc levels: Endplate spurring and bilateral facet arthropathy at the T11-T12 level result in mild canal stenosis and severe bilateral foraminal stenosis. No significant foraminal or canal stenosis at any other level within the thoracic spine. MRI LUMBAR SPINE FINDINGS Segmentation:  Standard. Alignment: Grade 1 anterolisthesis of L3 on L4, and to a lesser degree at L2-3. Vertebrae: Prior fusion and decompression at L2-L5. Reactive discogenic endplate changes at L1-2. No evidence of discitis within the lumbar spine. No fracture. Chronic fusion across the L5-S1 disc space. Conus medullaris and cauda equina: Conus extends to the L1-2 level. Conus and cauda equina appear normal. Paraspinal and other soft tissues: Negative. Disc levels: T12-L1: No disc protrusion. Mild bilateral facet arthropathy. No foraminal or canal stenosis. L1-L2: Diffuse disc bulge with cranially extending disc extrusion in the left subarticular zone. Advanced bilateral facet arthropathy with ligamentum flavum buckling. Severe canal stenosis with moderate-severe bilateral foraminal stenosis. Findings have progressed from prior. L2-L3 through L4-L5: Prior fusion and decompression without foraminal or canal stenosis. L5-S1: Fused disc space. No canal stenosis. Chronic mild bilateral foraminal stenosis. IMPRESSION: 1. Findings of low-grade or indolent discitis-osteomyelitis at T11-12. No paravertebral edema or fluid. No epidural abscess. Correlate for signs of systemic infection including serum inflammatory markers. 2. No additional sites of discitis within the thoracic or lumbar spine. 3. Prior fusion and decompression at L2-L5 without foraminal or canal stenosis at these levels. 4. Progressive adjacent segment disease at L1-L2 with severe canal stenosis and moderate-severe bilateral foraminal stenosis. Electronically Signed   By: Duanne Guess D.O.   On: 12/27/2022 22:04    Procedures Procedures    Medications Ordered in  ED Medications  methylPREDNISolone sodium succinate (SOLU-MEDROL) 125 mg/2 mL injection 125 mg (has no administration in time range)  ketorolac (TORADOL) 15 MG/ML injection 15 mg (has no administration in time range)  oxyCODONE-acetaminophen (PERCOCET/ROXICET) 5-325 MG per tablet 1 tablet (1 tablet Oral Given 12/27/22 1427)  HYDROmorphone (DILAUDID) injection 1 mg (1 mg Intramuscular Given 12/27/22 1746)  HYDROmorphone (DILAUDID) injection 1 mg (1 mg Intramuscular Given 12/27/22 2124)  ED Course/ Medical Decision Making/ A&P                                 Medical Decision Making Pt complains of back pain.  Pt saw her Neurosurgeon and had a ct of her thoracic and lumbar spine.  Pt has a history of osteomylitis.   Amount and/or Complexity of Data Reviewed Independent Historian: spouse    Details: Pt's husband is here with pt.  He is supportive  External Data Reviewed: notes.    Details: Neurosurgery notes reviewed  Labs: ordered. Decision-making details documented in ED Course.    Details: Labs ordered reviewed and interpreted.  Pt's ua shows no infection.  Cbc shows a normal wbc count  Radiology:     Details: Pt had mri T and LS spine ordered at triage.  Results reviewed,    Discussion of management or test interpretation with external provider(s): I discussed the pt with Dr. Danielle Dess Neurosurgeon.  He reviewed MRi of thoracic and lumbar spine.  He does not think pt has any acute changes.  No cauda equina.  He states no cause for urinary incontinence, no sign of infection  Risk Prescription drug management. Risk Details: Pt counseled on results.  Pt has pain medication at home.  Pt advised to discuss pain management with her MD and possible Physical therapy to help with mobility            Final Clinical Impression(s) / ED Diagnoses Final diagnoses:  Acute low back pain without sciatica, unspecified back pain laterality    Rx / DC Orders ED Discharge Orders     None       An After Visit Summary was printed and given to the patient.    Elson Areas, [place] 12/28/22 1653    Gwyneth Sprout, MD 12/28/22 [phone]

## 2023-01-01 ENCOUNTER — Other Ambulatory Visit: Payer: Self-pay | Admitting: Neurosurgery

## 2023-01-02 ENCOUNTER — Telehealth: Payer: Self-pay

## 2023-01-02 NOTE — Telephone Encounter (Signed)
Primary Cardiologist:Kenneth C Hilty, MD   Preoperative team, please contact this patient and set up a phone call appointment for further preoperative risk assessment. Please obtain consent and complete medication review. Thank you for your help.   Per office protocol and pending no concerning cardiac symptoms, she may hold Brilinta for 5 days prior to procedure and should resume as soon as hemodynamically stable postoperatively. Ideally aspirin should be continued without interruption, however if the bleeding risk is too great, aspirin may be held for 5-7 days prior to surgery. Please resume aspirin post operatively when it is felt to be safe from a bleeding standpoint.   I also confirmed the patient resides in the state of [place]. As per [facility] Board telemedicine laws, the patient must reside in the state in which the provider is licensed.   Levi Aland, NP-C  01/02/2023, 4:05 PM [address] Office [phone] Fax [phone]

## 2023-01-02 NOTE — Telephone Encounter (Signed)
Pre-operative Risk Assessment    Patient Name: Tamara Nunez  DOB: 14-May-1955 MRN: [id number]      Request for Surgical Clearance    Procedure:   T11-L2 Fusion  Date of Surgery:  Clearance 01/16/23                                 Surgeon:  Jillyn Hidden P. Cecilie Kicks Group or Practice Name:  [facility] Phone number:  [phone] Fax number:  [phone]   Type of Clearance Requested:   - Medical    Type of Anesthesia:  General    Additional requests/questions:    Garrel Ridgel   01/02/2023, 3:37 PM

## 2023-01-05 ENCOUNTER — Telehealth: Payer: Self-pay | Admitting: *Deleted

## 2023-01-05 NOTE — Telephone Encounter (Signed)
Pt has been added on into provider over book per Eligha Bridegroom, NP due to procedure date and med hold. Pt states she is no longer taking ASA, that she states the doctor took her off ASA as she is on Brilinta as well. Med rec and consent are done.

## 2023-01-05 NOTE — Telephone Encounter (Signed)
  Patient Consent for Virtual Visit        Tamara Nunez has provided verbal consent on 01/05/2023 for a virtual visit (video or telephone).   CONSENT FOR VIRTUAL VISIT FOR:  Tamara Nunez  By participating in this virtual visit I agree to the following:  I hereby voluntarily request, consent and authorize [facility] and its employed or contracted physicians, physician assistants, nurse practitioners or other licensed health care professionals (the Practitioner), to provide me with telemedicine health care services (the "Services") as deemed necessary by the treating Practitioner. I acknowledge and consent to receive the Services by the Practitioner via telemedicine. I understand that the telemedicine visit will involve communicating with the Practitioner through live audiovisual communication technology and the disclosure of certain medical information by electronic transmission. I acknowledge that I have been given the opportunity to request an in-person assessment or other available alternative prior to the telemedicine visit and am voluntarily participating in the telemedicine visit.  I understand that I have the right to withhold or withdraw my consent to the use of telemedicine in the course of my care at any time, without affecting my right to future care or treatment, and that the Practitioner or I may terminate the telemedicine visit at any time. I understand that I have the right to inspect all information obtained and/or recorded in the course of the telemedicine visit and may receive copies of available information for a reasonable fee.  I understand that some of the potential risks of receiving the Services via telemedicine include:  Delay or interruption in medical evaluation due to technological equipment failure or disruption; Information transmitted may not be sufficient (e.g. poor resolution of images) to allow for appropriate medical decision making by the  Practitioner; and/or  In rare instances, security protocols could fail, causing a breach of personal health information.  Furthermore, I acknowledge that it is my responsibility to provide information about my medical history, conditions and care that is complete and accurate to the best of my ability. I acknowledge that Practitioner's advice, recommendations, and/or decision may be based on factors not within their control, such as incomplete or inaccurate data provided by me or distortions of diagnostic images or specimens that may result from electronic transmissions. I understand that the practice of medicine is not an exact science and that Practitioner makes no warranties or guarantees regarding treatment outcomes. I acknowledge that a copy of this consent can be made available to me via my patient portal [facility] MyChart), or I can request a printed copy by calling the office of [facility].    I understand that my insurance will be billed for this visit.   I have read or had this consent read to me. I understand the contents of this consent, which adequately explains the benefits and risks of the Services being provided via telemedicine.  I have been provided ample opportunity to ask questions regarding this consent and the Services and have had my questions answered to my satisfaction. I give my informed consent for the services to be provided through the use of telemedicine in my medical care

## 2023-01-05 NOTE — Telephone Encounter (Signed)
Pt has been added on into provider over book per Eligha Bridegroom, NP due to procedure date and med hold. Pt states she is no longer taking ASA, that she states the doctor took her off ASA as she is on Brilinta as well. Med rec and consent are done.     Patient Consent for Virtual Visit        Tamara Nunez has provided verbal consent on 01/05/2023 for a virtual visit (video or telephone).   CONSENT FOR VIRTUAL VISIT FOR:  Tamara Nunez  By participating in this virtual visit I agree to the following:  I hereby voluntarily request, consent and authorize [facility] and its employed or contracted physicians, physician assistants, nurse practitioners or other licensed health care professionals (the Practitioner), to provide me with telemedicine health care services (the "Services") as deemed necessary by the treating Practitioner. I acknowledge and consent to receive the Services by the Practitioner via telemedicine. I understand that the telemedicine visit will involve communicating with the Practitioner through live audiovisual communication technology and the disclosure of certain medical information by electronic transmission. I acknowledge that I have been given the opportunity to request an in-person assessment or other available alternative prior to the telemedicine visit and am voluntarily participating in the telemedicine visit.  I understand that I have the right to withhold or withdraw my consent to the use of telemedicine in the course of my care at any time, without affecting my right to future care or treatment, and that the Practitioner or I may terminate the telemedicine visit at any time. I understand that I have the right to inspect all information obtained and/or recorded in the course of the telemedicine visit and may receive copies of available information for a reasonable fee.  I understand that some of the potential risks of receiving the Services via telemedicine  include:  Delay or interruption in medical evaluation due to technological equipment failure or disruption; Information transmitted may not be sufficient (e.g. poor resolution of images) to allow for appropriate medical decision making by the Practitioner; and/or  In rare instances, security protocols could fail, causing a breach of personal health information.  Furthermore, I acknowledge that it is my responsibility to provide information about my medical history, conditions and care that is complete and accurate to the best of my ability. I acknowledge that Practitioner's advice, recommendations, and/or decision may be based on factors not within their control, such as incomplete or inaccurate data provided by me or distortions of diagnostic images or specimens that may result from electronic transmissions. I understand that the practice of medicine is not an exact science and that Practitioner makes no warranties or guarantees regarding treatment outcomes. I acknowledge that a copy of this consent can be made available to me via my patient portal [facility] MyChart), or I can request a printed copy by calling the office of [facility].    I understand that my insurance will be billed for this visit.   I have read or had this consent read to me. I understand the contents of this consent, which adequately explains the benefits and risks of the Services being provided via telemedicine.  I have been provided ample opportunity to ask questions regarding this consent and the Services and have had my questions answered to my satisfaction. I give my informed consent for the services to be provided through the use of telemedicine in my medical care

## 2023-01-06 ENCOUNTER — Encounter (HOSPITAL_COMMUNITY)
Admission: RE | Admit: 2023-01-06 | Discharge: 2023-01-06 | Disposition: A | Discharge status: Home / Self Care | Payer: 59 | Source: Ambulatory Visit | Attending: Neurosurgery | Admitting: Neurosurgery

## 2023-01-06 ENCOUNTER — Encounter (HOSPITAL_COMMUNITY): Payer: Self-pay

## 2023-01-06 ENCOUNTER — Other Ambulatory Visit: Payer: Self-pay

## 2023-01-06 VITALS — BP 115/66 | HR 61 | Temp 97.7°F | Resp 17 | Ht 64.0 in | Wt 172.5 lb

## 2023-01-06 DIAGNOSIS — Z01812 Encounter for preprocedural laboratory examination: Secondary | ICD-10-CM | POA: Diagnosis present

## 2023-01-06 DIAGNOSIS — Z01818 Encounter for other preprocedural examination: Secondary | ICD-10-CM

## 2023-01-06 HISTORY — DX: Depression, unspecified: F32.A

## 2023-01-06 LAB — TYPE AND SCREEN
ABO/RH(D): O POS
Antibody Screen: NEGATIVE

## 2023-01-06 LAB — HEMOGLOBIN A1C
Hgb A1c MFr Bld: 5.8 % — ABNORMAL HIGH (ref 4.8–5.6)
Mean Plasma Glucose: 119.76 mg/dL

## 2023-01-06 LAB — SURGICAL PCR SCREEN
MRSA, PCR: NEGATIVE
Staphylococcus aureus: NEGATIVE

## 2023-01-06 LAB — GLUCOSE, CAPILLARY: Glucose-Capillary: 79 mg/dL (ref 70–99)

## 2023-01-06 NOTE — Progress Notes (Addendum)
Surgical Instructions   Your procedure is scheduled on Friday January 16, 2023. Report to [facility] Main Entrance "A" at 1:15 P.M.  then check in with the Admitting office. Any questions or running late day of surgery: call [phone]  Questions prior to your surgery date: call [phone], Monday-Friday, 8am-4pm. If you experience any cold or flu symptoms such as cough, fever, chills, shortness of breath, etc. between now and your scheduled surgery, please notify us at the above number.     Remember:  Do not eat after midnight the night before your surgery  You may drink clear liquids until 12:15 the day of your surgery.   Clear liquids allowed are: Water, Non-Citrus Juices (without pulp), Carbonated Beverages, Clear Tea, Black Coffee Only (NO MILK, CREAM OR POWDERED CREAMER of any kind), and Gatorade.    Take these medicines the morning of surgery with A SIP OF WATER  amLODipine (NORVASC)  atorvastatin (LIPITOR)  cetirizine (ZYRTEC)  fluticasone (FLONASE)  gabapentin (NEURONTIN)  isosorbide mononitrate (IMDUR)  metoprolol tartrate (LOPRESSOR)  ranolazine (RANEXA)    May take these medicines IF NEEDED: acetaminophen (TYLENOL)  albuterol (VENTOLIN HFA) 108 (90 Base) MCG/ACT inhaler.  Please bring inhaler with you to the hospital.  cyclobenzaprine (FLEXERIL)  dicyclomine (BENTYL)  methocarbamol (ROBAXIN)  pantoprazole (PROTONIX)  traMADol (ULTRAM)    PER YOUR CARDIOLOGIST, HOLD YOUR BRILINTA FOR 5 DAYS PRIOR TO SURGERY, WITH THE LAST DOSE BEING 01/07/2023.    One week prior to surgery, STOP taking any Aspirin (unless otherwise instructed by your surgeon) Aleve, Naproxen, Ibuprofen, Motrin, Advil, Goody's, BC's, all herbal medications, fish oil, and non-prescription vitamins.  This includes your diclofenac sodium (VOLTAREN) 1 % GEL                      Do NOT Smoke (Tobacco/Vaping) for 24 hours prior to your procedure.  If you use a CPAP at night, you may bring your  mask/headgear for your overnight stay.   You will be asked to remove any contacts, glasses, piercing's, hearing aid's, dentures/partials prior to surgery. Please bring cases for these items if needed.    Patients discharged the day of surgery will not be allowed to drive home, and someone needs to stay with them for 24 hours.  SURGICAL WAITING ROOM VISITATION Patients may have no more than 2 support people in the waiting area - these visitors may rotate.   Pre-op nurse will coordinate an appropriate time for 1 ADULT support person, who may not rotate, to accompany patient in pre-op.  Children under the age of 87 must have an adult with them who is not the patient and must remain in the main waiting area with an adult.  If the patient needs to stay at the hospital during part of their recovery, the visitor guidelines for inpatient rooms apply.  Please refer to the [facility] website for the visitor guidelines for any additional information.   If you received a COVID test during your pre-op visit  it is requested that you wear a mask when out in public, stay away from anyone that may not be feeling well and notify your surgeon if you develop symptoms. If you have been in contact with anyone that has tested positive in the last 10 days please notify you surgeon.      Pre-operative 5 CHG Bathing Instructions   You can play a key role in reducing the risk of infection after surgery. Your skin needs to be as  free of germs as possible. You can reduce the number of germs on your skin by washing with CHG (chlorhexidine gluconate) soap before surgery. CHG is an antiseptic soap that kills germs and continues to kill germs even after washing.   DO NOT use if you have an allergy to chlorhexidine/CHG or antibacterial soaps. If your skin becomes reddened or irritated, stop using the CHG and notify one of our RNs at [phone].   Please shower with the CHG soap starting 4 days before surgery using the  following schedule:     Please keep in mind the following:  DO NOT shave, including legs and underarms, starting the day of your first shower.   You may shave your face at any point before/day of surgery.  Place clean sheets on your bed the day you start using CHG soap. Use a clean washcloth (not used since being washed) for each shower. DO NOT sleep with pets once you start using the CHG.   CHG Shower Instructions:  Wash your face and private area with normal soap. If you choose to wash your hair, wash first with your normal shampoo.  After you use shampoo/soap, rinse your hair and body thoroughly to remove shampoo/soap residue.  Turn the water OFF and apply about 3 tablespoons (45 ml) of CHG soap to a CLEAN washcloth.  Apply CHG soap ONLY FROM YOUR NECK DOWN TO YOUR TOES (washing for 3-5 minutes)  DO NOT use CHG soap on face, private areas, open wounds, or sores.  Pay special attention to the area where your surgery is being performed.  If you are having back surgery, having someone wash your back for you may be helpful. Wait 2 minutes after CHG soap is applied, then you may rinse off the CHG soap.  Pat dry with a clean towel  Put on clean clothes/pajamas   If you choose to wear lotion, please use ONLY the CHG-compatible lotions on the back of this paper.   Additional instructions for the day of surgery: DO NOT APPLY any lotions, deodorants or perfumes.   Do not bring valuables to the hospital. [facility] is not responsible for any belongings/valuables. Do not wear nail polish, gel polish, artificial nails, or any other type of covering on natural nails (fingers and toes) Do not wear jewelry or makeup Put on clean/comfortable clothes.  Please brush your teeth.  Ask your nurse before applying any prescription medications to the skin.     CHG Compatible Lotions   Aveeno Moisturizing lotion  Cetaphil Moisturizing Cream  Cetaphil Moisturizing Lotion  Clairol Herbal Essence  Moisturizing Lotion, Dry Skin  Clairol Herbal Essence Moisturizing Lotion, Extra Dry Skin  Clairol Herbal Essence Moisturizing Lotion, Normal Skin  Curel Age Defying Therapeutic Moisturizing Lotion with Alpha Hydroxy  Curel Extreme Care Body Lotion  Curel Soothing Hands Moisturizing Hand Lotion  Curel Therapeutic Moisturizing Cream, Fragrance-Free  Curel Therapeutic Moisturizing Lotion, Fragrance-Free  Curel Therapeutic Moisturizing Lotion, Original Formula  Eucerin Daily Replenishing Lotion  Eucerin Dry Skin Therapy Plus Alpha Hydroxy Crme  Eucerin Dry Skin Therapy Plus Alpha Hydroxy Lotion  Eucerin Original Crme  Eucerin Original Lotion  Eucerin Plus Crme Eucerin Plus Lotion  Eucerin TriLipid Replenishing Lotion  Keri Anti-Bacterial Hand Lotion  Keri Deep Conditioning Original Lotion Dry Skin Formula Softly Scented  Keri Deep Conditioning Original Lotion, Fragrance Free Sensitive Skin Formula  Keri Lotion Fast Absorbing Fragrance Free Sensitive Skin Formula  Keri Lotion Fast Absorbing Softly Scented Dry Skin Formula  Keri Original  Lotion  [employer] Skin Renewal Lotion Keri Silky Smooth Lotion  Keri Silky Smooth Sensitive Skin Lotion  Nivea Body Creamy Conditioning Oil  Nivea Body Extra Enriched Teacher, adult education Moisturizing Lotion Nivea Crme  Nivea Skin Firming Lotion  NutraDerm 30 Skin Lotion  NutraDerm Skin Lotion  NutraDerm Therapeutic Skin Cream  NutraDerm Therapeutic Skin Lotion  ProShield Protective Hand Cream  Provon moisturizing lotion  Please read over the following fact sheets that you were given.

## 2023-01-06 NOTE — Progress Notes (Signed)
PCP - Dr. Early Chars Cardiologist - Dr. Zoila Shutter  PPM/ICD - denies Device Orders - na Rep Notified - na  Chest x-ray - na EKG - 07/02/22 Stress Test - 03/08/2020 ECHO -  Cardiac Cath -   Sleep Study - denies CPAP - na  Type II diabetic.  Blood sugar 79 in PAT.  A1C was 5.3 on 10/13/2022.  Repeat A1C drawn during PAT.  Diet controlled Fasting Blood Sugar - 80-90 Checks Blood Sugar weekly.   Last dose of GLP1 agonist-  denies GLP1 instructions: na  Blood Thinner Instructions: Brilinta, last dose 01/07/2023 Aspirin Instructions:denies  ERAS Protcol - Clear fluids until 1215  COVID TEST- na  Anesthesia review: Yes DM, HTN, CAD, high cholesterol, CABG  Patient denies shortness of breath, fever, cough and chest pain at PAT appointment   All instructions explained to the patient, with a verbal understanding of the material. Patient agrees to go over the instructions while at home for a better understanding. Patient also instructed to self quarantine after being tested for COVID-19. The opportunity to ask questions was provided.

## 2023-01-07 ENCOUNTER — Ambulatory Visit: Payer: Medicare Other | Attending: Nurse Practitioner

## 2023-01-07 DIAGNOSIS — Z01810 Encounter for preprocedural cardiovascular examination: Secondary | ICD-10-CM | POA: Diagnosis not present

## 2023-01-07 NOTE — Anesthesia Preprocedure Evaluation (Addendum)
Anesthesia Evaluation  Patient identified by MRN, date of birth, ID band Patient awake    Reviewed: Allergy & Precautions, NPO status , Patient's Chart, lab work & pertinent test results  Airway Mallampati: II  TM Distance: >3 FB Neck ROM: Full    Dental no notable dental hx.    Pulmonary neg pulmonary ROS   Pulmonary exam normal        Cardiovascular hypertension, Pt. on medications and Pt. on home beta blockers + CAD, + Past MI, + CABG, + Peripheral Vascular Disease and + DOE   Rhythm:Regular Rate:Normal  NM PET CT Cardiac Perfusion Study 08/06/22:   The study is normal. The study is low risk.   LV perfusion is normal. There is no evidence of ischemia. There is no evidence of infarction.   Rest left ventricular function is normal. Rest EF: 70 %. Stress left ventricular function is normal. Stress EF: 70 %. End diastolic cavity size is normal. End systolic cavity size is normal. No evidence of transient ischemic dilation (TID) noted.   Myocardial blood flow was computed to be 0.14ml/g/min at rest and 1.77ml/g/min at stress. Global myocardial blood flow reserve was 2.29 and was normal.   Coronary calcium assessment not performed due to prior revascularization. Aortic atherosclersosis seen.    Neuro/Psych    Depression    negative neurological ROS     GI/Hepatic Neg liver ROS,GERD  Medicated,,  Endo/Other  diabetes, Type 2    Renal/GU   negative genitourinary   Musculoskeletal  (+) Arthritis ,  Fibromyalgia -  Abdominal Normal abdominal exam  (+)   Peds  Hematology  (+) Blood dyscrasia, anemia Lab Results      Component                Value               Date                      WBC                      5.9                 12/27/2022                HGB                      13.8                12/27/2022                HCT                      42.4                12/27/2022                MCV                       90.4                12/27/2022                PLT                      297  12/27/2022             Lab Results      Component                Value               Date                      NA                       136                 12/27/2022                K                        4.0                 12/27/2022                CO2                      20 (L)              12/27/2022                GLUCOSE                  116 (H)             12/27/2022                BUN                      15                  12/27/2022                CREATININE               0.92                12/27/2022                CALCIUM                  9.4                 12/27/2022                EGFR                     72                  04/10/2022                GFRNONAA                 >60                 12/27/2022              Anesthesia Other Findings   Reproductive/Obstetrics                             Anesthesia Physical Anesthesia Plan  ASA: 3  Anesthesia Plan: General   Post-op Pain Management: Tylenol PO (pre-op)* and Ketamine IV*  Induction: Intravenous  PONV Risk Score and Plan: 3 and Ondansetron, Dexamethasone, Midazolam and Treatment may vary due to age or medical condition  Airway Management Planned: Mask and Oral ETT  Additional Equipment: None  Intra-op Plan:   Post-operative Plan: Extubation in OR  Informed Consent: I have reviewed the patients History and Physical, chart, labs and discussed the procedure including the risks, benefits and alternatives for the proposed anesthesia with the patient or authorized representative who has indicated his/her understanding and acceptance.     Dental advisory given  Plan Discussed with: CRNA  Anesthesia Plan Comments: (PAT note written 01/07/2023 by Shonna Chock, PA-C.  )       Anesthesia Quick Evaluation

## 2023-01-07 NOTE — Progress Notes (Signed)
Virtual Visit via Telephone Note   Because of Tamara Nunez's co-morbid illnesses, she is at least at moderate risk for complications without adequate follow up.  This format is felt to be most appropriate for this patient at this time.  The patient did not have access to video technology/had technical difficulties with video requiring transitioning to audio format only (telephone).  All issues noted in this document were discussed and addressed.  No physical exam could be performed with this format.  Please refer to the patient's chart for her consent to telehealth for [facility].  Evaluation Performed:  Preoperative cardiovascular risk assessment _____________   Date:  01/07/2023   Patient ID:  Tamara Nunez, DOB 29-Sep-1955, MRN [id number] Patient Location:  Home Provider location:   Office  Primary Care Provider:  Theodoro Kos, MD Primary Cardiologist:  Chrystie Nose, MD  Chief Complaint / Patient Profile   67 y.o. y/o female with a h/o  CAD s/p CABG 2006 with LIMA to LAD, SVG to distal LAD, hypertension, hyperlipidemia, DM II, GERD, and fibromyalgia  who is pending T11-L2 fusion and presents today for telephonic preoperative cardiovascular risk assessment.  History of Present Illness    Tamara Nunez is a 67 y.o. female who presents via audio/video conferencing for a telehealth visit today.  Pt was last seen in cardiology clinic on 08/27/2022 by Azalee Course, NP.  At that time Tamara Nunez was doing well with improvement to chest pain but noted bruising in lower extremities.The patient is now pending procedure as outlined above. Since her last visit, she has been doing well with no new cardiac complaints or concerns.  She denies chest pain, shortness of breath, lower extremity edema, fatigue, palpitations, melena, hematuria, hemoptysis, diaphoresis, weakness, presyncope, syncope, orthopnea, and PND.    Past Medical History    Past Medical History:   Diagnosis Date   Arthritis    "in my back" (05/06/2017)   CAD (coronary artery disease)    CABG 2006, 09/2016 DES RCA, 04/2017 DES Circ, 06/2018 DES 1st diag   Chronic back pain    Coronary artery disease    a. s/p CABG in 2006 with LIMA-LAD, SVG-dLAD. b. cath in 2009 showing patent grafts. c. 09/2016: cath with occluded LAD but patent SVG-dLAD. LIMA was atretic. pRCA with 80% stenosis --> treated with DES   Depression    Fibromyalgia    GERD (gastroesophageal reflux disease)    Hyperlipidemia    Hypertension    Type II diabetes mellitus (HCC)    Past Surgical History:  Procedure Laterality Date   ABDOMINAL HERNIA REPAIR  2018   BACK SURGERY     CARDIAC CATHETERIZATION  01/13/2008   COLONOSCOPY     CORONARY ANGIOPLASTY WITH STENT PLACEMENT  09/26/2016   Prox RCA lesion, 80 %stenosed. DES    CORONARY ARTERY BYPASS GRAFT  12/23/2004   2 vessel - LIMA to LAD (atretic), SVG to distal LAD   CORONARY ATHERECTOMY N/A 08/16/2019   Procedure: CORONARY ATHERECTOMY;  Surgeon: Swaziland, Peter M, MD;  Location: [facility] INVASIVE CV LAB;  Service: Cardiovascular;  Laterality: N/A;   CORONARY STENT INTERVENTION N/A 09/26/2016   Procedure: Coronary Stent Intervention;  Surgeon: Marykay Lex, MD;  Location: [facility] INVASIVE CV LAB;  Service: Cardiovascular;  Laterality: N/A;   CORONARY STENT INTERVENTION N/A 05/06/2017   Procedure: CORONARY STENT INTERVENTION;  Surgeon: Kathleene Hazel, MD;  Location: [facility] INVASIVE CV LAB;  Service: Cardiovascular;  Laterality: N/A;  CORONARY STENT INTERVENTION N/A 06/25/2018   Procedure: CORONARY STENT INTERVENTION;  Surgeon: Swaziland, Peter M, MD;  Location: [facility] INVASIVE CV LAB;  Service: Cardiovascular;  Laterality: N/A;   CORONARY STENT INTERVENTION N/A 08/12/2019   Procedure: CORONARY STENT INTERVENTION;  Surgeon: Lyn Records, MD;  Location: [facility] INVASIVE CV LAB;  Service: Cardiovascular;  Laterality: N/A;   CORONARY STENT INTERVENTION N/A 08/16/2019   Procedure: CORONARY  STENT INTERVENTION;  Surgeon: Swaziland, Peter M, MD;  Location: [facility] INVASIVE CV LAB;  Service: Cardiovascular;  Laterality: N/A;   DOPPLER ECHOCARDIOGRAPHY  04/12/2008   EF 60%   GASTRIC BYPASS  2009   HERNIA REPAIR     LEFT HEART CATH AND CORONARY ANGIOGRAPHY N/A 05/06/2017   Procedure: LEFT HEART CATH AND CORONARY ANGIOGRAPHY;  Surgeon: Kathleene Hazel, MD;  Location: [facility] INVASIVE CV LAB;  Service: Cardiovascular;  Laterality: N/A;   LEFT HEART CATH AND CORS/GRAFTS ANGIOGRAPHY N/A 09/26/2016   Procedure: Left Heart Cath and Cors/Grafts Angiography;  Surgeon: Marykay Lex, MD;  Location: [facility] INVASIVE CV LAB;  Service: Cardiovascular;  Laterality: N/A;   LEFT HEART CATH AND CORS/GRAFTS ANGIOGRAPHY N/A 06/25/2018   Procedure: LEFT HEART CATH AND CORS/GRAFTS ANGIOGRAPHY;  Surgeon: Swaziland, Peter M, MD;  Location: [facility] INVASIVE CV LAB;  Service: Cardiovascular;  Laterality: N/A;   LEFT HEART CATH AND CORS/GRAFTS ANGIOGRAPHY N/A 08/12/2019   Procedure: LEFT HEART CATH AND CORS/GRAFTS ANGIOGRAPHY;  Surgeon: Lyn Records, MD;  Location: [facility] INVASIVE CV LAB;  Service: Cardiovascular;  Laterality: N/A;   LEFT HEART CATH AND CORS/GRAFTS ANGIOGRAPHY N/A 03/08/2020   Procedure: LEFT HEART CATH AND CORS/GRAFTS ANGIOGRAPHY;  Surgeon: Swaziland, Peter M, MD;  Location: [facility] INVASIVE CV LAB;  Service: Cardiovascular;  Laterality: N/A;   NM MYOVIEW LTD  06/03/2012   EF 71%   POSTERIOR FUSION LUMBAR SPINE  1990s   TOTAL KNEE ARTHROPLASTY Left 02/26/2021   Procedure: TOTAL KNEE ARTHROPLASTY;  Surgeon: Durene Romans, MD;  Location: [facility] ORS;  Service: Orthopedics;  Laterality: Left;   TOTAL KNEE ARTHROPLASTY Right 08/13/2021   Procedure: TOTAL KNEE ARTHROPLASTY;  Surgeon: Durene Romans, MD;  Location: [facility] ORS;  Service: Orthopedics;  Laterality: Right;   TUBAL LIGATION     VAGINAL HYSTERECTOMY     "partial"    Allergies  Allergies  Allergen Reactions   Codeine Hives and Shortness Of Breath        Other Anaphylaxis     Nuts   Ciprofloxacin Itching   Valsartan Rash   Adhesive [Tape] Itching    "clear tape" used after surgery."took all her skin off".  Paper take is ok   Sulfa Antibiotics Nausea Only    Home Medications    Prior to Admission medications   Medication Sig Start Date End Date Taking? Authorizing Provider  Accu-Chek Softclix Lancets lancets USE TO TEST BLOOD GLUCOSE TWICE  DAILY 03/21/22   Roma Kayser, MD  acetaminophen (TYLENOL) 500 MG tablet Take 2 tablets (1,000 mg total) by mouth every 6 (six) hours. 08/14/21   Cassandria Anger, PA-C  albuterol (VENTOLIN HFA) 108 (90 Base) MCG/ACT inhaler Inhale 1 puff into the lungs every 6 (six) hours as needed for wheezing.    [provider]  amLODipine (NORVASC) 2.5 MG tablet Take 1 tablet (2.5 mg total) by mouth daily. 08/26/19 01/06/23  Azalee Course, PA  atorvastatin (LIPITOR) 40 MG tablet Take 1 tablet (40 mg total) by mouth daily. 08/17/19   Dunn, Tacey Ruiz, PA-C  Blood Glucose Monitoring  Suppl (ACCU-CHEK GUIDE ME) w/Device KIT Use to test BG qid 08/06/21   Roma Kayser, MD  BRILINTA 60 MG TABS tablet TAKE 1 TABLET BY MOUTH TWICE  DAILY 05/28/22   Hilty, Lisette Abu, MD  cetirizine (ZYRTEC) 10 MG tablet Take 10 mg by mouth daily.    [provider]  Cholecalciferol 50 MCG (2000 UT) CAPS Take 1 capsule (2,000 Units total) by mouth daily with breakfast. 10/21/21   Nida, Denman George, MD  Coenzyme Q10 (CO Q-10) 200 MG CAPS Take 200 mg by mouth daily.    [provider]  Cyanocobalamin (VITAMIN B-12 PO) Take 3 tablets by mouth daily.    [provider]  cyclobenzaprine (FLEXERIL) 10 MG tablet Take 1 tablet (10 mg total) by mouth 3 (three) times daily as needed for muscle spasms. 08/14/21   Cassandria Anger, PA-C  diclofenac sodium (VOLTAREN) 1 % GEL Apply 1 application. topically in the morning and at bedtime. 10/31/18   [provider]  dicyclomine (BENTYL) 10 MG capsule Take 10 mg by mouth every 6  (six) hours as needed for spasms. 01/06/20   [provider]  Docusate Calcium (STOOL SOFTENER PO) Take 1 capsule by mouth daily.    [provider]  EPINEPHrine 0.3 mg/0.3 mL IJ SOAJ injection Inject 0.3 mg into the muscle as needed for anaphylaxis. 05/20/21   [provider]  Fingerstix Lancets MISC Inject into the skin. 03/23/19   [provider]  fluticasone (FLONASE) 50 MCG/ACT nasal spray Place 1 spray into both nostrils 2 (two) times daily.    [provider]  furosemide (LASIX) 40 MG tablet Take 40 mg by mouth daily as needed for fluid or edema.    [provider]  gabapentin (NEURONTIN) 600 MG tablet Take 600 mg by mouth in the morning, at noon, in the evening, and at bedtime. 09/07/17   [provider]  glucose blood (ACCU-CHEK GUIDE) test strip USE TO CHECK BLOOD GLUCOSE DAILY AS DIRECTED 04/14/22   Nida, Denman George, MD  glucose blood (ACCU-CHEK GUIDE) test strip for testing as directed Dx E11.65 07/22/22   Roma Kayser, MD  glucose blood (ACCU-CHEK GUIDE) test strip 1 each by Other route in the morning and at bedtime. Test BG bid. Dx E11.65 07/23/22   Roma Kayser, MD  Iron-Vitamin C (VITRON-C PO) Take 1 tablet by mouth daily.    [provider]  isosorbide mononitrate (IMDUR) 60 MG 24 hr tablet Take 1 tablet (60 mg total) by mouth daily. 07/02/22   Azalee Course, PA  lidocaine (LIDODERM) 5 % Place 1 patch onto the skin daily as needed. Remove & Discard patch within 12 hours or as directed by MD Patient taking differently: Place 2 patches onto the skin See admin instructions. Put 2 patches on back 1-2 times a day as needed for pain 03/04/21   Tanda Rockers A, DO  methocarbamol (ROBAXIN) 500 MG tablet Take 1 tablet (500 mg total) by mouth every 8 (eight) hours as needed for muscle spasms. 10/21/21   Rancour, Jeannett Senior, MD  metoprolol tartrate (LOPRESSOR) 50 MG tablet Take 1 tablet (50 mg total) by mouth 2 (two)  times daily. 06/13/20   Azalee Course, PA  Multiple Vitamin (MULTIVITAMIN WITH MINERALS) TABS tablet Take 2 tablets by mouth daily.    [provider]  nitroGLYCERIN (NITROSTAT) 0.4 MG SL tablet Place 1 tablet (0.4 mg total) under the tongue every 5 (five) minutes as needed for chest pain (  up to 3 doses). Patient taking differently: Place 0.4 mg under the tongue every 5 (five) minutes as needed for chest pain. 08/17/19   Dunn, Tacey Ruiz, PA-C  pantoprazole (PROTONIX) 40 MG tablet Take 40 mg by mouth 2 (two) times daily as needed (acid reflux).    [provider]  polyethylene glycol (MIRALAX / GLYCOLAX) 17 g packet Take 17 g by mouth daily as needed for mild constipation. 02/27/21   Cassandria Anger, PA-C  ranolazine (RANEXA) 1000 MG SR tablet Take 1 tablet (1,000 mg total) by mouth 2 (two) times daily. 06/16/13   Hilty, Lisette Abu, MD  traMADol (ULTRAM) 50 MG tablet Take 50 mg by mouth as needed. 11/20/21   [provider]  vitamin E 400 UNIT capsule Take 400 Units by mouth daily.    [provider]    Physical Exam    Vital Signs:  Tamara Nunez does not have vital signs available for review today.  Given telephonic nature of communication, physical exam is limited. AAOx3. NAD. Normal affect.  Speech and respirations are unlabored.  Accessory Clinical Findings    None  Assessment & Plan    1.  Preoperative Cardiovascular Risk Assessment: -Patient's RCRI score is 6.6% The patient affirms she has been doing well without any new cardiac symptoms. They are able to achieve 5 METS without cardiac limitations. Therefore, based on ACC/AHA guidelines, the patient would be at acceptable risk for the planned procedure without further cardiovascular testing. The patient was advised that if she develops new symptoms prior to surgery to contact our office to arrange for a follow-up visit, and she verbalized understanding.   The patient was advised that if she develops new  symptoms prior to surgery to contact our office to arrange for a follow-up visit, and she verbalized understanding.  Patient can hold Brilinta for 5 days prior to procedure and should resume as soon as hemodynamically stable postoperatively. Ideally aspirin should be continued without interruption, however if the bleeding risk is too great, aspirin may be held for 5-7 days prior to surgery  A copy of this note will be routed to requesting surgeon.  Time:   Today, I have spent 6 minutes with the patient with telehealth technology discussing medical history, symptoms, and management plan.     Napoleon Form, Leodis Rains, NP  01/07/2023, 7:58 AM

## 2023-01-07 NOTE — Progress Notes (Signed)
Anesthesia Chart Review:  Case: [id number] Date/Time: 01/23/23 1214   Procedure: T11-T12 - T12-L1 - L1-L2 extension tying into her old construct at L2-3 pedicle screws and rods and PLIF at L1-L2 - Posterior Lateral and Interbody fusion (Back)   Anesthesia type: General   Pre-op diagnosis: Lumbar spondylitis   [facility] RO[facility] / [facility]   Surgeons: Donalee Citrin, MD       DISCUSSION: Patient is a 67 year old female scheduled for the above procedure.  History includes never smoker, HTN, HLD, CAD (CABG 12/23/04: LIMA-LAD, SVG-dLAD; atretic LIMA-LAD, DES pRCA 09/26/16; DES oCX 05/06/17; DES pD1 06/25/18; PTCA/DES oD overlapping with previous DES D1 08/12/19; orbital atherectomy/DES mid RCA 08/16/19), DM2 (diet controlled), GERD, fibromyalgia, gastric bypass, osteoarthritis (TKA 02/26/21; TKA 08/13/21)  She is followed by cardiologist Dr. Rennis Golden for CAD, s/p CABG and several subsequent PCIs. Last LHC on 03/08/20 showed CAD with continued patency of prior stents, no new stenoses, patent SVG-LAD, known atretic LIMA-LAD which is supplied by the SVG, normal LVF, normal LVEDP. Continued medical therapy for CAD recommended. She had a more recent NM PET CT Cardiac perfusion study on 08/06/22 which was normal, resting EF 70%. She had a preoperative telephonic cardiology evaluation on 01/07/23 by Robin Searing, NP. He wrote: "Preoperative Cardiovascular Risk Assessment: -Patient's RCRI score is 6.6% The patient affirms she has been doing well without any new cardiac symptoms. They are able to achieve 5 METS without cardiac limitations. Therefore, based on ACC/AHA guidelines, the patient would be at acceptable risk for the planned procedure without further cardiovascular testing. The patient was advised that if she develops new symptoms prior to surgery to contact our office to arrange for a follow-up visit, and she verbalized understanding." He added that Brilinta may be held for 5 days prior to procedure and resume as soon  as hemodynamically stable post-operatively. She is not currently on ASA. She reported last Brilinta dose scheduled for 01/07/23.   A1c 5.8% on 01/06/23.   Anesthesia team to evaluate on the day of surgery.   VS:  Wt Readings from Last 3 Encounters:  01/06/23 78.2 kg  12/27/22 76.8 kg  10/13/22 76.8 kg   BP Readings from Last 3 Encounters:  01/06/23 115/66  12/28/22 (!) 167/81  10/13/22 136/78   Pulse Readings from Last 3 Encounters:  01/06/23 61  12/28/22 64  10/13/22 (!) 56    PROVIDERS: Theodoro Kos, MD is PCP  Chrystie Nose, MD is cardiologist Purcell Nails, MD is endocrinologist   LABS: Most recent lab result in [facility] include: Lab Results  Component Value Date   WBC 5.9 12/27/2022   HGB 13.8 12/27/2022   HCT 42.4 12/27/2022   PLT 297 12/27/2022   GLUCOSE 116 (H) 12/27/2022   CHOL 149 04/10/2022   TRIG 50 04/10/2022   HDL 87 04/10/2022   LDLCALC 51 04/10/2022   ALT 53 (H) 04/10/2022   AST 46 (H) 04/10/2022   NA 136 12/27/2022   K 4.0 12/27/2022   CL 105 12/27/2022   CREATININE 0.92 12/27/2022   BUN 15 12/27/2022   CO2 20 (L) 12/27/2022   TSH 0.698 04/10/2022   INR 1.0 04/30/2017   HGBA1C 5.8 (H) 01/06/2023   MICROALBUR 1.04 04/27/2019    IMAGES: MRI T/L-spine 12/27/22: IMPRESSION: 1. Findings of low-grade or indolent discitis-osteomyelitis at T11-12. No paravertebral edema or fluid. No epidural abscess. Correlate for signs of systemic infection including serum inflammatory markers. 2. No additional sites of discitis within the thoracic  or lumbar spine. 3. Prior fusion and decompression at L2-L5 without foraminal or canal stenosis at these levels. 4. Progressive adjacent segment disease at L1-L2 with severe canal stenosis and moderate-severe bilateral foraminal stenosis.  CT T/L-spine  05/27/22: IMPRESSION: 1. Increased endplate erosive changes at T11-T12, which has progressed since the 10/18/2021 CT abdomen pelvis. In  addition, there is possible soft tissue material an additional erosive changes posterior aspect of T12, which may represent a disc extrusion but is indeterminate. While these could be degenerative, an infectious process cannot be excluded. Correlate with labs and consider MRI with and without contrast if clinically indicated. 2. T11-T12 mild spinal canal stenosis, severe left neural foraminal narrowing, and moderate right neural foraminal narrowing. 3. Mild spinal canal stenosis at L1-L2. 4. Mild bilateral neural foraminal narrowing at L5-S1.   EKG: 07/02/22 ([facility]) Sinus bradycardia at 57 bpm Possible left atrial enlargement Right bundle branch block   CV: NM PET CT Cardiac Perfusion Study 08/06/22:   The study is normal. The study is low risk.   LV perfusion is normal. There is no evidence of ischemia. There is no evidence of infarction.   Rest left ventricular function is normal. Rest EF: 70 %. Stress left ventricular function is normal. Stress EF: 70 %. End diastolic cavity size is normal. End systolic cavity size is normal. No evidence of transient ischemic dilation (TID) noted.   Myocardial blood flow was computed to be 0.29ml/g/min at rest and 1.85ml/g/min at stress. Global myocardial blood flow reserve was 2.29 and was normal.   Coronary calcium assessment not performed due to prior revascularization. Aortic atherosclersosis seen.   Cardiac Cath 03/08/20:   Non-stenotic Ost 1st Diag-2 lesion was previously treated. Non-stenotic Ost 1st Diag-1 lesion was previously treated. Prox LAD to Mid LAD lesion is 100% stenosed. Prox LAD-2 lesion is 100% stenosed. Non-stenotic Prox LAD-1 lesion was previously treated. Ost 2nd Mrg lesion is 70% stenosed. Prox Cx lesion is 40% stenosed. Mid Cx lesion is 20% stenosed. Non-stenotic Ost Cx lesion was previously treated. Prox RCA lesion is 10% stenosed. Prox RCA to Mid RCA lesion is 30% stenosed. LIMA. Origin to Mid Graft lesion  is 99% stenosed. SVG and is normal in caliber. Mid RCA to Dist RCA lesion is 35% stenosed. The left ventricular systolic function is normal. LV end diastolic pressure is normal. The left ventricular ejection fraction is 55-65% by visual estimate.   1. CAD with continued patency of prior stents. No new stenoses to explain her recent symptoms. 2. Patent SVG to LAD 3. Atretic LIMA to the LAD which is supplied by the SVG 4. Normal LV function 5. Normal LVEDP   Plan: continue medical therapy.   Echo 11/14/13 [facility]): Summary   1. Overall left ventricular ejection fraction is estimated at 55 to 60%.   2. Normal global left ventricular systolic function.   3. (Grade 1) Mildly abnormal left ventricular diastolic filling.   4. Borderline concentric left ventricular hypertrophy.   5. Decreased left ventricular internal cavity size.   6. No intracardiac thrombi, mass or vegetations.    Past Medical History:  Diagnosis Date   Arthritis    "in my back" (05/06/2017)   CAD (coronary artery disease)    CABG 2006, 09/2016 DES RCA, 04/2017 DES Circ, 06/2018 DES 1st diag   Chronic back pain    Coronary artery disease    a. s/p CABG in 2006 with LIMA-LAD, SVG-dLAD. b. cath in 2009 showing patent grafts. c. 09/2016: cath with  occluded LAD but patent SVG-dLAD. LIMA was atretic. pRCA with 80% stenosis --> treated with DES   Depression    Fibromyalgia    GERD (gastroesophageal reflux disease)    Hyperlipidemia    Hypertension    Type II diabetes mellitus (HCC)     Past Surgical History:  Procedure Laterality Date   ABDOMINAL HERNIA REPAIR  2018   BACK SURGERY     CARDIAC CATHETERIZATION  01/13/2008   COLONOSCOPY     CORONARY ANGIOPLASTY WITH STENT PLACEMENT  09/26/2016   Prox RCA lesion, 80 %stenosed. DES    CORONARY ARTERY BYPASS GRAFT  12/23/2004   2 vessel - LIMA to LAD (atretic), SVG to distal LAD   CORONARY ATHERECTOMY N/A 08/16/2019   Procedure: CORONARY ATHERECTOMY;   Surgeon: Swaziland, Peter M, MD;  Location: [facility] INVASIVE CV LAB;  Service: Cardiovascular;  Laterality: N/A;   CORONARY STENT INTERVENTION N/A 09/26/2016   Procedure: Coronary Stent Intervention;  Surgeon: Marykay Lex, MD;  Location: [facility] INVASIVE CV LAB;  Service: Cardiovascular;  Laterality: N/A;   CORONARY STENT INTERVENTION N/A 05/06/2017   Procedure: CORONARY STENT INTERVENTION;  Surgeon: Kathleene Hazel, MD;  Location: [facility] INVASIVE CV LAB;  Service: Cardiovascular;  Laterality: N/A;   CORONARY STENT INTERVENTION N/A 06/25/2018   Procedure: CORONARY STENT INTERVENTION;  Surgeon: Swaziland, Peter M, MD;  Location: [facility] INVASIVE CV LAB;  Service: Cardiovascular;  Laterality: N/A;   CORONARY STENT INTERVENTION N/A 08/12/2019   Procedure: CORONARY STENT INTERVENTION;  Surgeon: Lyn Records, MD;  [facility]: [facility] INVAS[facility] CV LAB;  Service: Cardiovascular;  Laterality: N/A;   CORONARY STENT INTERVENTION N/A 08/16/2019   Procedure: CORONARY STENT INTERVENTION;  Surgeon: Swaziland, Peter M, MD;  Location: [facility] INVASIVE CV LAB;  Service: Cardiovascular;  Laterality: N/A;   DOPPLER ECHOCARDIOGRAPHY  04/12/2008   EF 60%   GASTRIC BYPASS  2009   HERNIA REPAIR     LEFT HEART CATH AND CORONARY ANGIOGRAPHY N/A 05/06/2017   Procedure: LEFT HEART CATH AND CORONARY ANGIOGRAPHY;  Surgeon: Kathleene Hazel, MD;  Location: [facility] INVAS[facility] CV LAB;  Service: Cardiovascular;  Laterality: N/A;   LEFT HEART CATH AND CORS/GRAFTS ANGIOGRAPHY N/A 09/26/2016   Procedure: Left Heart Cath and Cors/Grafts Angiography;  Surgeon: Marykay Lex, MD;  Location: [facility] INVASIVE CV LAB;  Service: Cardiovascular;  Laterality: N/A;   LEFT HEART CATH AND CORS/GRAFTS ANGIOGRAPHY N/A 06/25/2018   Procedure: LEFT HEART CATH AND CORS/GRAFTS ANGIOGRAPHY;  Surgeon: Swaziland, Peter M, MD;  Location: [facility] INVASIVE CV LAB;  Service: Cardiovascular;  Laterality: N/A;   LEFT HEART CATH AND CORS/GRAFTS ANGIOGRAPHY N/A 08/12/2019   Procedure: LEFT HEART CATH AND  CORS/GRAFTS ANGIOGRAPHY;  Surgeon: Lyn Records, MD;  Location: [facility] INVASIVE CV LAB;  Service: Cardiovascular;  Laterality: N/A;   LEFT HEART CATH AND CORS/GRAFTS ANGIOGRAPHY N/A 03/08/2020   Procedure: LEFT HEART CATH AND CORS/GRAFTS ANGIOGRAPHY;  Surgeon: Swaziland, Peter M, MD;  Location: [facility] INVASIVE CV LAB;  Service: Cardiovascular;  Laterality: N/A;   NM MYOVIEW LTD  06/03/2012   EF 71%   POSTERIOR FUSION LUMBAR SPINE  1990s   TOTAL KNEE ARTHROPLASTY Left 02/26/2021   Procedure: TOTAL KNEE ARTHROPLASTY;  Surgeon: Durene Romans, MD;  Location: [facility] ORS;  Service: Orthopedics;  Laterality: Left;   TOTAL KNEE ARTHROPLASTY Right 08/13/2021   Procedure: TOTAL KNEE ARTHROPLASTY;  Surgeon: Durene Romans, MD;  Location: [facility] ORS;  Service: Orthopedics;  Laterality: Right;   TUBAL LIGATION     VAGINAL HYSTERECTOMY     "  partial"    MEDICATIONS:  sodium chloride flush (NS) 0.9 % injection 3 mL    acetaminophen (TYLENOL) 500 MG tablet   albuterol (VENTOLIN HFA) 108 (90 Base) MCG/ACT inhaler   amLODipine (NORVASC) 2.5 MG tablet   atorvastatin (LIPITOR) 40 MG tablet   BRILINTA 60 MG TABS tablet   cetirizine (ZYRTEC) 10 MG tablet   Cholecalciferol 50 MCG (2000 UT) CAPS   Coenzyme Q10 (CO Q-10) 200 MG CAPS   Cyanocobalamin (VITAMIN B-12 PO)   cyclobenzaprine (FLEXERIL) 10 MG tablet   diclofenac sodium (VOLTAREN) 1 % GEL   dicyclomine (BENTYL) 10 MG capsule   Docusate Calcium (STOOL SOFTENER PO)   EPINEPHrine 0.3 mg/0.3 mL IJ SOAJ injection   fluticasone (FLONASE) 50 MCG/ACT nasal spray   furosemide (LASIX) 40 MG tablet   gabapentin (NEURONTIN) 600 MG tablet   Iron-Vitamin C (VITRON-C PO)   isosorbide mononitrate (IMDUR) 60 MG 24 hr tablet   lidocaine (LIDODERM) 5 %   methocarbamol (ROBAXIN) 500 MG tablet   metoprolol tartrate (LOPRESSOR) 50 MG tablet   Multiple Vitamin (MULTIVITAMIN WITH MINERALS) TABS tablet   nitroGLYCERIN (NITROSTAT) 0.4 MG SL tablet   pantoprazole (PROTONIX) 40 MG tablet    polyethylene glycol (MIRALAX / GLYCOLAX) 17 g packet   ranolazine (RANEXA) 1000 MG SR tablet   traMADol (ULTRAM) 50 MG tablet   vitamin E 400 UNIT capsule   Accu-Chek Softclix Lancets lancets   Blood Glucose Monitoring Suppl (ACCU-CHEK GUIDE ME) w/Device KIT   Fingerstix Lancets MISC   glucose blood (ACCU-CHEK GUIDE) test strip   glucose blood (ACCU-CHEK GUIDE) test strip   glucose blood (ACCU-CHEK GUIDE) test strip    Shonna Chock, PA-C Surgical Short Stay/Anesthesiology [facility] Phone [phone] [facility] Phone [phone] 01/07/2023 4:31 PM

## 2023-01-23 ENCOUNTER — Inpatient Hospital Stay (HOSPITAL_COMMUNITY): Payer: Medicare Other

## 2023-01-23 ENCOUNTER — Other Ambulatory Visit: Payer: Self-pay

## 2023-01-23 ENCOUNTER — Inpatient Hospital Stay (HOSPITAL_COMMUNITY): Payer: Medicare Other | Admitting: Vascular Surgery

## 2023-01-23 ENCOUNTER — Encounter (HOSPITAL_COMMUNITY): Payer: Self-pay | Admitting: Neurosurgery

## 2023-01-23 ENCOUNTER — Inpatient Hospital Stay (HOSPITAL_COMMUNITY)
Admission: RE | Admit: 2023-01-23 | Discharge: 2023-01-27 | DRG: 458 | Disposition: A | Discharge status: Rehabilitation Facility | Payer: Medicare Other | Attending: Neurosurgery | Admitting: Neurosurgery

## 2023-01-23 ENCOUNTER — Encounter (HOSPITAL_COMMUNITY)
Admission: RE | Disposition: A | Discharge status: Rehabilitation Facility | Payer: Self-pay | Source: Home / Self Care | Attending: Neurosurgery

## 2023-01-23 DIAGNOSIS — I2510 Atherosclerotic heart disease of native coronary artery without angina pectoris: Secondary | ICD-10-CM | POA: Diagnosis present

## 2023-01-23 DIAGNOSIS — I959 Hypotension, unspecified: Secondary | ICD-10-CM | POA: Diagnosis not present

## 2023-01-23 DIAGNOSIS — Z91018 Allergy to other foods: Secondary | ICD-10-CM | POA: Diagnosis not present

## 2023-01-23 DIAGNOSIS — Z7982 Long term (current) use of aspirin: Secondary | ICD-10-CM

## 2023-01-23 DIAGNOSIS — Z955 Presence of coronary angioplasty implant and graft: Secondary | ICD-10-CM | POA: Diagnosis not present

## 2023-01-23 DIAGNOSIS — M5126 Other intervertebral disc displacement, lumbar region: Principal | ICD-10-CM | POA: Diagnosis present

## 2023-01-23 DIAGNOSIS — Z751 Person awaiting admission to adequate facility elsewhere: Secondary | ICD-10-CM

## 2023-01-23 DIAGNOSIS — Z981 Arthrodesis status: Secondary | ICD-10-CM

## 2023-01-23 DIAGNOSIS — Z01818 Encounter for other preprocedural examination: Secondary | ICD-10-CM

## 2023-01-23 DIAGNOSIS — K219 Gastro-esophageal reflux disease without esophagitis: Secondary | ICD-10-CM | POA: Diagnosis present

## 2023-01-23 DIAGNOSIS — E11649 Type 2 diabetes mellitus with hypoglycemia without coma: Secondary | ICD-10-CM | POA: Diagnosis present

## 2023-01-23 DIAGNOSIS — Z8249 Family history of ischemic heart disease and other diseases of the circulatory system: Secondary | ICD-10-CM | POA: Diagnosis not present

## 2023-01-23 DIAGNOSIS — Z9884 Bariatric surgery status: Secondary | ICD-10-CM | POA: Diagnosis not present

## 2023-01-23 DIAGNOSIS — M48061 Spinal stenosis, lumbar region without neurogenic claudication: Secondary | ICD-10-CM

## 2023-01-23 DIAGNOSIS — Z90710 Acquired absence of both cervix and uterus: Secondary | ICD-10-CM | POA: Diagnosis not present

## 2023-01-23 DIAGNOSIS — Z882 Allergy status to sulfonamides status: Secondary | ICD-10-CM

## 2023-01-23 DIAGNOSIS — Z7902 Long term (current) use of antithrombotics/antiplatelets: Secondary | ICD-10-CM

## 2023-01-23 DIAGNOSIS — Z96653 Presence of artificial knee joint, bilateral: Secondary | ICD-10-CM | POA: Diagnosis present

## 2023-01-23 DIAGNOSIS — Z885 Allergy status to narcotic agent status: Secondary | ICD-10-CM | POA: Diagnosis not present

## 2023-01-23 DIAGNOSIS — F32A Depression, unspecified: Secondary | ICD-10-CM | POA: Diagnosis present

## 2023-01-23 DIAGNOSIS — Z951 Presence of aortocoronary bypass graft: Secondary | ICD-10-CM | POA: Diagnosis not present

## 2023-01-23 DIAGNOSIS — Z79899 Other long term (current) drug therapy: Secondary | ICD-10-CM

## 2023-01-23 DIAGNOSIS — E1151 Type 2 diabetes mellitus with diabetic peripheral angiopathy without gangrene: Secondary | ICD-10-CM | POA: Diagnosis present

## 2023-01-23 DIAGNOSIS — I119 Hypertensive heart disease without heart failure: Secondary | ICD-10-CM | POA: Diagnosis present

## 2023-01-23 DIAGNOSIS — M5116 Intervertebral disc disorders with radiculopathy, lumbar region: Principal | ICD-10-CM | POA: Diagnosis present

## 2023-01-23 DIAGNOSIS — E785 Hyperlipidemia, unspecified: Secondary | ICD-10-CM | POA: Diagnosis present

## 2023-01-23 DIAGNOSIS — M51369 Other intervertebral disc degeneration, lumbar region without mention of lumbar back pain or lower extremity pain: Secondary | ICD-10-CM | POA: Diagnosis present

## 2023-01-23 DIAGNOSIS — M797 Fibromyalgia: Secondary | ICD-10-CM | POA: Diagnosis present

## 2023-01-23 DIAGNOSIS — M532X4 Spinal instabilities, thoracic region: Secondary | ICD-10-CM | POA: Diagnosis present

## 2023-01-23 LAB — GLUCOSE, CAPILLARY
Glucose-Capillary: 78 mg/dL (ref 70–99)
Glucose-Capillary: 72 mg/dL (ref 70–99)
Glucose-Capillary: 105 mg/dL — ABNORMAL HIGH (ref 70–99)
Glucose-Capillary: 41 mg/dL — CL (ref 70–99)
Glucose-Capillary: 94 mg/dL (ref 70–99)
Glucose-Capillary: 112 mg/dL — ABNORMAL HIGH (ref 70–99)
Glucose-Capillary: 131 mg/dL — ABNORMAL HIGH (ref 70–99)

## 2023-01-23 LAB — TYPE AND SCREEN
ABO/RH(D): O POS
Antibody Screen: NEGATIVE

## 2023-01-23 SURGERY — POSTERIOR LUMBAR FUSION 2 WITH HARDWARE REMOVAL
Anesthesia: General | Site: Back

## 2023-01-23 MED ORDER — FENTANYL CITRATE (PF) 250 MCG/5ML IJ SOLN
INTRAMUSCULAR | Status: AC
  Filled 2023-01-23: qty 5

## 2023-01-23 MED ORDER — HYDROMORPHONE HCL 1 MG/ML IJ SOLN
0.5000 mg | INTRAMUSCULAR | Status: DC | PRN
Start: 2023-01-23 — End: 2023-01-27
  Administered 2023-01-23 – 2023-01-24 (×2): 0.5 mg via INTRAVENOUS
  Filled 2023-01-23 (×2): qty 0.5

## 2023-01-23 MED ORDER — LORATADINE 10 MG PO TABS
10.0000 mg | ORAL_TABLET | Freq: Every day | ORAL | Status: DC
  Administered 2023-01-24 – 2023-01-27 (×4): 10 mg via ORAL
  Filled 2023-01-23 (×4): qty 1

## 2023-01-23 MED ORDER — LIDOCAINE 2% (20 MG/ML) 5 ML SYRINGE
INTRAMUSCULAR | Status: AC
  Filled 2023-01-23: qty 5

## 2023-01-23 MED ORDER — CHLORHEXIDINE GLUCONATE CLOTH 2 % EX PADS: 6.0000 | MEDICATED_PAD | Freq: Once | CUTANEOUS | Status: DC

## 2023-01-23 MED ORDER — ACETAMINOPHEN 500 MG PO TABS
1000.0000 mg | ORAL_TABLET | Freq: Once | ORAL | Status: AC
  Administered 2023-01-23: 1000 mg via ORAL
  Filled 2023-01-23: qty 2

## 2023-01-23 MED ORDER — ORAL CARE MOUTH RINSE: 15.0000 mL | Freq: Once | OROMUCOSAL | Status: AC

## 2023-01-23 MED ORDER — ASPIRIN 81 MG PO TBEC
81.0000 mg | DELAYED_RELEASE_TABLET | Freq: Every day | ORAL | Status: DC
Start: 2023-01-24 — End: 2023-01-27
  Administered 2023-01-24 – 2023-01-27 (×4): 81 mg via ORAL
  Filled 2023-01-23 (×4): qty 1

## 2023-01-23 MED ORDER — VITAMIN E 45 MG (100 UNIT) PO CAPS
400.0000 [IU] | ORAL_CAPSULE | Freq: Every day | ORAL | Status: DC
  Administered 2023-01-24 – 2023-01-27 (×4): 400 [IU] via ORAL
  Filled 2023-01-23 (×4): qty 4

## 2023-01-23 MED ORDER — FENTANYL CITRATE (PF) 100 MCG/2ML IJ SOLN
INTRAMUSCULAR | Status: AC
  Filled 2023-01-23: qty 2

## 2023-01-23 MED ORDER — SUFENTANIL CITRATE 50 MCG/ML IV SOLN
INTRAVENOUS | Status: DC | PRN
  Administered 2023-01-23: .3 ug/kg/h via INTRAVENOUS

## 2023-01-23 MED ORDER — BUPIVACAINE HCL (PF) 0.25 % IJ SOLN
INTRAMUSCULAR | Status: AC
  Filled 2023-01-23: qty 30

## 2023-01-23 MED ORDER — KETAMINE HCL 10 MG/ML IJ SOLN
INTRAMUSCULAR | Status: DC | PRN
  Administered 2023-01-23 (×2): 25 mg via INTRAVENOUS

## 2023-01-23 MED ORDER — DEXTROSE 50 % IV SOLN
INTRAVENOUS | Status: AC
  Filled 2023-01-23: qty 50

## 2023-01-23 MED ORDER — ALBUTEROL SULFATE (2.5 MG/3ML) 0.083% IN NEBU: 3.0000 mL | INHALATION_SOLUTION | Freq: Four times a day (QID) | RESPIRATORY_TRACT | Status: DC | PRN

## 2023-01-23 MED ORDER — SODIUM CHLORIDE 0.9% FLUSH
3.0000 mL | Freq: Two times a day (BID) | INTRAVENOUS | Status: DC
  Administered 2023-01-23 – 2023-01-27 (×7): 3 mL via INTRAVENOUS

## 2023-01-23 MED ORDER — SODIUM CHLORIDE 0.9% FLUSH
3.0000 mL | INTRAVENOUS | Status: DC | PRN
  Administered 2023-01-26: 3 mL via INTRAVENOUS

## 2023-01-23 MED ORDER — DEXAMETHASONE SODIUM PHOSPHATE 10 MG/ML IJ SOLN
INTRAMUSCULAR | Status: DC | PRN
  Administered 2023-01-23: 10 mg via INTRAVENOUS

## 2023-01-23 MED ORDER — ACETAMINOPHEN 650 MG RE SUPP
650.0000 mg | RECTAL | Status: DC | PRN
  Filled 2023-01-23: qty 1

## 2023-01-23 MED ORDER — FUROSEMIDE 40 MG PO TABS: 40.0000 mg | ORAL_TABLET | Freq: Every day | ORAL | Status: DC | PRN

## 2023-01-23 MED ORDER — ISOSORBIDE MONONITRATE ER 60 MG PO TB24
60.0000 mg | ORAL_TABLET | Freq: Every day | ORAL | Status: DC
  Administered 2023-01-27: 60 mg via ORAL
  Filled 2023-01-23 (×2): qty 1

## 2023-01-23 MED ORDER — PHENYLEPHRINE HCL-NACL 20-0.9 MG/250ML-% IV SOLN
INTRAVENOUS | Status: DC | PRN
  Administered 2023-01-23: 50 ug/min via INTRAVENOUS

## 2023-01-23 MED ORDER — CYCLOBENZAPRINE HCL 10 MG PO TABS: 10.0000 mg | ORAL_TABLET | Freq: Three times a day (TID) | ORAL | Status: DC | PRN

## 2023-01-23 MED ORDER — AMLODIPINE BESYLATE 2.5 MG PO TABS
2.5000 mg | ORAL_TABLET | Freq: Every day | ORAL | Status: DC
  Administered 2023-01-26 – 2023-01-27 (×2): 2.5 mg via ORAL
  Filled 2023-01-23 (×3): qty 1

## 2023-01-23 MED ORDER — ACETAMINOPHEN 500 MG PO TABS
1000.0000 mg | ORAL_TABLET | Freq: Four times a day (QID) | ORAL | Status: DC
  Administered 2023-01-23 – 2023-01-27 (×11): 1000 mg via ORAL
  Filled 2023-01-23 (×14): qty 2

## 2023-01-23 MED ORDER — SUGAMMADEX SODIUM 200 MG/2ML IV SOLN
INTRAVENOUS | Status: DC | PRN
  Administered 2023-01-23: 200 mg via INTRAVENOUS

## 2023-01-23 MED ORDER — EPINEPHRINE 0.3 MG/0.3ML IJ SOAJ: 0.3000 mg | INTRAMUSCULAR | Status: DC | PRN

## 2023-01-23 MED ORDER — PHENOL 1.4 % MT LIQD: 1.0000 | OROMUCOSAL | Status: DC | PRN

## 2023-01-23 MED ORDER — CEFAZOLIN SODIUM-DEXTROSE 2-4 GM/100ML-% IV SOLN
2.0000 g | INTRAVENOUS | Status: AC
  Administered 2023-01-23: 2 g via INTRAVENOUS

## 2023-01-23 MED ORDER — PANTOPRAZOLE SODIUM 40 MG PO TBEC: 40.0000 mg | DELAYED_RELEASE_TABLET | Freq: Two times a day (BID) | ORAL | Status: DC | PRN

## 2023-01-23 MED ORDER — FENTANYL CITRATE (PF) 250 MCG/5ML IJ SOLN
INTRAMUSCULAR | Status: DC | PRN
  Administered 2023-01-23 (×3): 50 ug via INTRAVENOUS
  Administered 2023-01-23: 100 ug via INTRAVENOUS

## 2023-01-23 MED ORDER — KETAMINE HCL 50 MG/5ML IJ SOSY
PREFILLED_SYRINGE | INTRAMUSCULAR | Status: AC
  Filled 2023-01-23: qty 5

## 2023-01-23 MED ORDER — THROMBIN 20000 UNITS EX SOLR
CUTANEOUS | Status: DC | PRN
  Administered 2023-01-23: 20 mL via TOPICAL

## 2023-01-23 MED ORDER — TRAMADOL HCL 50 MG PO TABS
50.0000 mg | ORAL_TABLET | Freq: Four times a day (QID) | ORAL | Status: DC
  Administered 2023-01-23 – 2023-01-27 (×15): 50 mg via ORAL
  Filled 2023-01-23 (×15): qty 1

## 2023-01-23 MED ORDER — GABAPENTIN 300 MG PO CAPS
600.0000 mg | ORAL_CAPSULE | Freq: Three times a day (TID) | ORAL | Status: DC
  Administered 2023-01-23 – 2023-01-27 (×11): 600 mg via ORAL
  Filled 2023-01-23 (×11): qty 2

## 2023-01-23 MED ORDER — ACETAMINOPHEN 325 MG PO TABS
650.0000 mg | ORAL_TABLET | ORAL | Status: DC | PRN
  Administered 2023-01-25 – 2023-01-27 (×3): 650 mg via ORAL
  Filled 2023-01-23 (×3): qty 2

## 2023-01-23 MED ORDER — CHLORHEXIDINE GLUCONATE 0.12 % MT SOLN
OROMUCOSAL | Status: AC
  Administered 2023-01-23: 15 mL via OROMUCOSAL
  Filled 2023-01-23: qty 15

## 2023-01-23 MED ORDER — INSULIN ASPART 100 UNIT/ML IJ SOLN
0.0000 [IU] | Freq: Three times a day (TID) | INTRAMUSCULAR | Status: DC
  Administered 2023-01-27: 3 [IU] via SUBCUTANEOUS

## 2023-01-23 MED ORDER — FENTANYL CITRATE (PF) 100 MCG/2ML IJ SOLN
25.0000 ug | INTRAMUSCULAR | Status: DC | PRN
Start: 2023-01-23 — End: 2023-01-23
  Administered 2023-01-23 (×3): 50 ug via INTRAVENOUS

## 2023-01-23 MED ORDER — DEXAMETHASONE SODIUM PHOSPHATE 10 MG/ML IJ SOLN
INTRAMUSCULAR | Status: AC
  Filled 2023-01-23: qty 2

## 2023-01-23 MED ORDER — POLYETHYLENE GLYCOL 3350 17 G PO PACK
17.0000 g | PACK | Freq: Every day | ORAL | Status: DC | PRN
  Administered 2023-01-27: 17 g via ORAL
  Filled 2023-01-23: qty 1

## 2023-01-23 MED ORDER — DOCUSATE SODIUM 100 MG PO CAPS
100.0000 mg | ORAL_CAPSULE | Freq: Every day | ORAL | Status: DC
  Administered 2023-01-23 – 2023-01-27 (×5): 100 mg via ORAL
  Filled 2023-01-23 (×5): qty 1

## 2023-01-23 MED ORDER — AMISULPRIDE (ANTIEMETIC) 5 MG/2ML IV SOLN: 10.0000 mg | Freq: Once | INTRAVENOUS | Status: DC | PRN

## 2023-01-23 MED ORDER — LIDOCAINE 5 % EX PTCH: 2.0000 | MEDICATED_PATCH | Freq: Every day | CUTANEOUS | Status: DC | PRN

## 2023-01-23 MED ORDER — NITROGLYCERIN 0.4 MG SL SUBL: 0.4000 mg | SUBLINGUAL_TABLET | SUBLINGUAL | Status: DC | PRN

## 2023-01-23 MED ORDER — ONDANSETRON HCL 4 MG/2ML IJ SOLN
INTRAMUSCULAR | Status: DC | PRN
  Administered 2023-01-23: 4 mg via INTRAVENOUS

## 2023-01-23 MED ORDER — FLUTICASONE PROPIONATE 50 MCG/ACT NA SUSP
1.0000 | Freq: Two times a day (BID) | NASAL | Status: DC
  Administered 2023-01-23 – 2023-01-27 (×8): 1 via NASAL
  Filled 2023-01-23: qty 16

## 2023-01-23 MED ORDER — ONDANSETRON HCL 4 MG/2ML IJ SOLN: 4.0000 mg | Freq: Four times a day (QID) | INTRAMUSCULAR | Status: DC | PRN

## 2023-01-23 MED ORDER — THROMBIN 20000 UNITS EX SOLR
CUTANEOUS | Status: AC
  Filled 2023-01-23: qty 20000

## 2023-01-23 MED ORDER — LIDOCAINE-EPINEPHRINE 1 %-1:100000 IJ SOLN
INTRAMUSCULAR | Status: DC | PRN
  Administered 2023-01-23: 10 mL

## 2023-01-23 MED ORDER — BUPIVACAINE LIPOSOME 1.3 % IJ SUSP
INTRAMUSCULAR | Status: AC
  Filled 2023-01-23: qty 20

## 2023-01-23 MED ORDER — SODIUM CHLORIDE 0.9 % IV SOLN
250.0000 mL | INTRAVENOUS | Status: AC
  Administered 2023-01-23: 250 mL via INTRAVENOUS

## 2023-01-23 MED ORDER — MENTHOL 3 MG MT LOZG: 1.0000 | LOZENGE | OROMUCOSAL | Status: DC | PRN

## 2023-01-23 MED ORDER — DEXAMETHASONE SODIUM PHOSPHATE 10 MG/ML IJ SOLN
INTRAMUSCULAR | Status: AC
  Filled 2023-01-23: qty 1

## 2023-01-23 MED ORDER — PROPOFOL 1000 MG/100ML IV EMUL
INTRAVENOUS | Status: AC
  Filled 2023-01-23: qty 100

## 2023-01-23 MED ORDER — CO Q-10 200 MG PO CAPS: 200.0000 mg | ORAL_CAPSULE | Freq: Every day | ORAL | Status: DC

## 2023-01-23 MED ORDER — PROPOFOL 10 MG/ML IV BOLUS
INTRAVENOUS | Status: AC
  Filled 2023-01-23: qty 20

## 2023-01-23 MED ORDER — ONDANSETRON HCL 4 MG PO TABS: 4.0000 mg | ORAL_TABLET | Freq: Four times a day (QID) | ORAL | Status: DC | PRN

## 2023-01-23 MED ORDER — LIDOCAINE 2% (20 MG/ML) 5 ML SYRINGE
INTRAMUSCULAR | Status: DC | PRN
  Administered 2023-01-23: 60 mg via INTRAVENOUS

## 2023-01-23 MED ORDER — DICYCLOMINE HCL 10 MG PO CAPS: 10.0000 mg | ORAL_CAPSULE | Freq: Four times a day (QID) | ORAL | Status: DC | PRN

## 2023-01-23 MED ORDER — MIDAZOLAM HCL 2 MG/2ML IJ SOLN
INTRAMUSCULAR | Status: AC
Start: 2023-01-23 — End: ?
  Filled 2023-01-23: qty 2

## 2023-01-23 MED ORDER — ATORVASTATIN CALCIUM 40 MG PO TABS
40.0000 mg | ORAL_TABLET | Freq: Every day | ORAL | Status: DC
  Administered 2023-01-23 – 2023-01-27 (×5): 40 mg via ORAL
  Filled 2023-01-23 (×5): qty 1

## 2023-01-23 MED ORDER — CHLORHEXIDINE GLUCONATE 0.12 % MT SOLN
15.0000 mL | Freq: Once | OROMUCOSAL | Status: AC
Start: 2023-01-23 — End: 2023-01-23

## 2023-01-23 MED ORDER — MIDAZOLAM HCL 2 MG/2ML IJ SOLN
INTRAMUSCULAR | Status: DC | PRN
  Administered 2023-01-23: 2 mg via INTRAVENOUS

## 2023-01-23 MED ORDER — PANTOPRAZOLE SODIUM 40 MG IV SOLR
40.0000 mg | Freq: Every day | INTRAVENOUS | Status: DC
Start: 2023-01-23 — End: 2023-01-24
  Administered 2023-01-23: 40 mg via INTRAVENOUS
  Filled 2023-01-23: qty 10

## 2023-01-23 MED ORDER — ONDANSETRON HCL 4 MG/2ML IJ SOLN
INTRAMUSCULAR | Status: AC
  Filled 2023-01-23: qty 2

## 2023-01-23 MED ORDER — ALBUMIN HUMAN 5 % IV SOLN: INTRAVENOUS | Status: DC | PRN

## 2023-01-23 MED ORDER — DEXTROSE 50 % IV SOLN
INTRAVENOUS | Status: DC | PRN
  Administered 2023-01-23: 25 mL via INTRAVENOUS

## 2023-01-23 MED ORDER — ADULT MULTIVITAMIN W/MINERALS CH
1.0000 | ORAL_TABLET | Freq: Every day | ORAL | Status: DC
  Administered 2023-01-24 – 2023-01-27 (×4): 1 via ORAL
  Filled 2023-01-23 (×4): qty 1

## 2023-01-23 MED ORDER — LACTATED RINGERS IV SOLN: INTRAVENOUS | Status: DC | PRN

## 2023-01-23 MED ORDER — PROPOFOL 10 MG/ML IV BOLUS
INTRAVENOUS | Status: DC | PRN
  Administered 2023-01-23: 150 mg via INTRAVENOUS

## 2023-01-23 MED ORDER — SUFENTANIL CITRATE 50 MCG/ML IV SOLN
0.5000 ug/kg/h | INTRAVENOUS | Status: DC
  Filled 2023-01-23 (×3): qty 1

## 2023-01-23 MED ORDER — 0.9 % SODIUM CHLORIDE (POUR BTL) OPTIME
TOPICAL | Status: DC | PRN
  Administered 2023-01-23: 1000 mL

## 2023-01-23 MED ORDER — ALUM & MAG HYDROXIDE-SIMETH 200-200-20 MG/5ML PO SUSP: 30.0000 mL | Freq: Four times a day (QID) | ORAL | Status: DC | PRN

## 2023-01-23 MED ORDER — OXYCODONE HCL 5 MG PO TABS
10.0000 mg | ORAL_TABLET | ORAL | Status: DC | PRN
Start: 2023-01-23 — End: 2023-01-27
  Administered 2023-01-24 – 2023-01-27 (×8): 10 mg via ORAL
  Filled 2023-01-23 (×9): qty 2

## 2023-01-23 MED ORDER — FE FUM-VIT C-VIT B12-FA 460-60-0.01-1 MG PO CAPS
1.0000 | ORAL_CAPSULE | Freq: Every day | ORAL | Status: DC
  Administered 2023-01-24 – 2023-01-27 (×4): 1 via ORAL
  Filled 2023-01-23 (×5): qty 1

## 2023-01-23 MED ORDER — CEFAZOLIN SODIUM-DEXTROSE 2-4 GM/100ML-% IV SOLN
2.0000 g | Freq: Three times a day (TID) | INTRAVENOUS | Status: AC
  Administered 2023-01-23 – 2023-01-25 (×6): 2 g via INTRAVENOUS
  Filled 2023-01-23 (×6): qty 100

## 2023-01-23 MED ORDER — RANOLAZINE ER 500 MG PO TB12
1000.0000 mg | ORAL_TABLET | Freq: Two times a day (BID) | ORAL | Status: DC
  Administered 2023-01-23 – 2023-01-27 (×8): 1000 mg via ORAL
  Filled 2023-01-23 (×9): qty 2

## 2023-01-23 MED ORDER — LIDOCAINE-EPINEPHRINE 1 %-1:100000 IJ SOLN
INTRAMUSCULAR | Status: AC
  Filled 2023-01-23: qty 1

## 2023-01-23 MED ORDER — VITAMIN D 25 MCG (1000 UNIT) PO TABS
2000.0000 [IU] | ORAL_TABLET | Freq: Every day | ORAL | Status: DC
  Administered 2023-01-24 – 2023-01-27 (×4): 2000 [IU] via ORAL
  Filled 2023-01-23 (×4): qty 2

## 2023-01-23 MED ORDER — METOPROLOL TARTRATE 50 MG PO TABS
50.0000 mg | ORAL_TABLET | Freq: Two times a day (BID) | ORAL | Status: DC
  Administered 2023-01-23 – 2023-01-27 (×2): 50 mg via ORAL
  Filled 2023-01-23 (×4): qty 1

## 2023-01-23 MED ORDER — ROCURONIUM BROMIDE 10 MG/ML (PF) SYRINGE
PREFILLED_SYRINGE | INTRAVENOUS | Status: AC
  Filled 2023-01-23: qty 10

## 2023-01-23 MED ORDER — BUPIVACAINE LIPOSOME 1.3 % IJ SUSP
INTRAMUSCULAR | Status: DC | PRN
  Administered 2023-01-23: 20 mL

## 2023-01-23 MED ORDER — CEFAZOLIN SODIUM-DEXTROSE 2-4 GM/100ML-% IV SOLN
INTRAVENOUS | Status: AC
  Filled 2023-01-23: qty 100

## 2023-01-23 MED ORDER — ROCURONIUM BROMIDE 10 MG/ML (PF) SYRINGE
PREFILLED_SYRINGE | INTRAVENOUS | Status: DC | PRN
  Administered 2023-01-23: 50 mg via INTRAVENOUS
  Administered 2023-01-23: 10 mg via INTRAVENOUS
  Administered 2023-01-23: 50 mg via INTRAVENOUS

## 2023-01-23 SURGICAL SUPPLY — 70 items
BAG COUNTER SPONGE SURGICOUNT (BAG) ×1 IMPLANT
BASKET BONE COLLECTION (BASKET) ×1 IMPLANT
BENZOIN TINCTURE PRP APPL 2/3 (GAUZE/BANDAGES/DRESSINGS) ×1 IMPLANT
BLADE BONE MILL MEDIUM (MISCELLANEOUS) ×1 IMPLANT
BLADE CLIPPER SURG (BLADE) IMPLANT
BLADE SURG 11 STRL SS (BLADE) ×1 IMPLANT
BONE VIVIGEN FORMABLE 10CC (Bone Implant) ×1 IMPLANT
BUR CUTTER 7.0 ROUND (BURR) ×1 IMPLANT
BUR MATCHSTICK NEURO 3.0 LAGG (BURR) ×1 IMPLANT
CANISTER SUCT 3000ML PPV (MISCELLANEOUS) ×1 IMPLANT
CNTNR URN SCR LID CUP LEK RST (MISCELLANEOUS) ×1 IMPLANT
CONT SPEC 4OZ STRL OR WHT (MISCELLANEOUS) ×1
COVER BACK TABLE 24X17X13 BIG (DRAPES) IMPLANT
COVER BACK TABLE 60X90IN (DRAPES) ×1 IMPLANT
DERMABOND ADVANCED .7 DNX12 (GAUZE/BANDAGES/DRESSINGS) ×1 IMPLANT
DRAPE C-ARM 42X72 X-RAY (DRAPES) ×1 IMPLANT
DRAPE C-ARMOR (DRAPES) IMPLANT
DRAPE HALF SHEET 40X57 (DRAPES) IMPLANT
DRAPE LAPAROTOMY 100X72X124 (DRAPES) ×1 IMPLANT
DRAPE SURG 17X23 STRL (DRAPES) ×1 IMPLANT
DRSG OPSITE 4X5.5 SM (GAUZE/BANDAGES/DRESSINGS) ×1 IMPLANT
DRSG OPSITE POSTOP 4X10 (GAUZE/BANDAGES/DRESSINGS) IMPLANT
DRSG OPSITE POSTOP 4X6 (GAUZE/BANDAGES/DRESSINGS) ×1 IMPLANT
DURAPREP 26ML APPLICATOR (WOUND CARE) ×1 IMPLANT
ELECT REM PT RETURN 9FT ADLT (ELECTROSURGICAL) ×1
ELECTRODE REM PT RTRN 9FT ADLT (ELECTROSURGICAL) ×1 IMPLANT
EVACUATOR 1/8 PVC DRAIN (DRAIN) IMPLANT
EVACUATOR 3/16 PVC DRAIN (DRAIN) ×1 IMPLANT
GAUZE 4X4 16PLY ~~LOC~~+RFID DBL (SPONGE) IMPLANT
GAUZE SPONGE 4X4 12PLY STRL (GAUZE/BANDAGES/DRESSINGS) ×1 IMPLANT
GLOVE BIO SURGEON STRL SZ7 (GLOVE) IMPLANT
GLOVE BIO SURGEON STRL SZ8 (GLOVE) ×2 IMPLANT
GLOVE BIOGEL PI IND STRL 7.0 (GLOVE) IMPLANT
GLOVE EXAM NITRILE XL STR (GLOVE) IMPLANT
GLOVE INDICATOR 8.5 STRL (GLOVE) ×2 IMPLANT
GOWN STRL REUS W/ TWL LRG LVL3 (GOWN DISPOSABLE) IMPLANT
GOWN STRL REUS W/ TWL XL LVL3 (GOWN DISPOSABLE) ×2 IMPLANT
GOWN STRL REUS W/TWL 2XL LVL3 (GOWN DISPOSABLE) IMPLANT
GOWN STRL REUS W/TWL LRG LVL3 (GOWN DISPOSABLE) ×1
GOWN STRL REUS W/TWL XL LVL3 (GOWN DISPOSABLE) ×2
GRAFT BNE MATRIX VG FRMBL L 10 (Bone Implant) IMPLANT
KIT BASIN OR (CUSTOM PROCEDURE TRAY) ×1 IMPLANT
KIT INFUSE SMALL (Orthopedic Implant) IMPLANT
KIT TURNOVER KIT B (KITS) ×1 IMPLANT
MILL BONE PREP (MISCELLANEOUS) ×1 IMPLANT
NDL HYPO 21X1.5 SAFETY (NEEDLE) ×1 IMPLANT
NDL HYPO 25X1 1.5 SAFETY (NEEDLE) ×1 IMPLANT
NEEDLE HYPO 21X1.5 SAFETY (NEEDLE) ×1
NEEDLE HYPO 25X1 1.5 SAFETY (NEEDLE) ×1
NS IRRIG 1000ML POUR BTL (IV SOLUTION) ×1 IMPLANT
PACK LAMINECTOMY NEURO (CUSTOM PROCEDURE TRAY) ×1 IMPLANT
PAD ARMBOARD 7.5X6 YLW CONV (MISCELLANEOUS) ×3 IMPLANT
ROD STRT ANNX 5.5X120 HALF JOG (Rod) IMPLANT
SCREW LOCK ANNEX SW 4.5 (Screw) IMPLANT
SCREW LOCK RELINE 5.5 TULIP (Screw) IMPLANT
SCREW RELINE-O POLY 5.5X40 (Screw) IMPLANT
SCREW RELINE-O POLY 6.5X40 (Screw) IMPLANT
SPIKE FLUID TRANSFER (MISCELLANEOUS) ×1 IMPLANT
SPONGE SURGIFOAM ABS GEL 100 (HEMOSTASIS) ×1 IMPLANT
SPONGE T-LAP 4X18 ~~LOC~~+RFID (SPONGE) IMPLANT
STRIP CLOSURE SKIN 1/2X4 (GAUZE/BANDAGES/DRESSINGS) ×2 IMPLANT
SUT VIC AB 0 CT1 18XCR BRD8 (SUTURE) ×1 IMPLANT
SUT VIC AB 0 CT1 8-18 (SUTURE) ×1
SUT VIC AB 2-0 CT1 18 (SUTURE) ×1 IMPLANT
SUT VIC AB 4-0 PS2 27 (SUTURE) ×1 IMPLANT
SYR 20ML LL LF (SYRINGE) ×1 IMPLANT
TOWEL GREEN STERILE (TOWEL DISPOSABLE) ×1 IMPLANT
TOWEL GREEN STERILE FF (TOWEL DISPOSABLE) ×1 IMPLANT
TRAY FOLEY MTR SLVR 16FR STAT (SET/KITS/TRAYS/PACK) ×1 IMPLANT
WATER STERILE IRR 1000ML POUR (IV SOLUTION) ×1 IMPLANT

## 2023-01-23 NOTE — Anesthesia Procedure Notes (Signed)
Procedure Name: Intubation Date/Time: 01/23/2023 2:16 PM  Performed by: Alease Medina, CRNAPre-anesthesia Checklist: Patient identified, Emergency Drugs available, Suction available and Patient being monitored Patient Re-evaluated:Patient Re-evaluated prior to induction Oxygen Delivery Method: Circle system utilized Preoxygenation: Pre-oxygenation with 100% oxygen Induction Type: IV induction Ventilation: Mask ventilation without difficulty Laryngoscope Size: Miller and 2 Grade View: Grade II Tube type: Oral Tube size: 7.0 mm Number of attempts: 1 Airway Equipment and Method: Stylet and Oral airway Placement Confirmation: ETT inserted through vocal cords under direct vision, positive ETCO2 and breath sounds checked- equal and bilateral Secured at: 23 cm Tube secured with: Tape Dental Injury: Teeth and Oropharynx as per pre-operative assessment  Comments: Performed by Ashok Pall

## 2023-01-23 NOTE — Op Note (Signed)
Preoperative diagnosis: Lumbar spinal stenosis L1-L2 and herniated nucleus pulposus L1-L2 with instability degenerative disc disease T11-T12 with instability.  With bilateral left greater than right L1-L2 radiculopathies and claudication  Postoperative diagnosis: Same.  Procedure: #1 decompressive laminectomy L1-L2 with complete medial facetectomies bilateral and foraminotomies of the L1 redo foraminotomies of the L2 nerve roots and microdiscectomy from the left at L1-L2.  2.  Pedicle screw fixation T10-L2 utilizing pedicle screws and the Stryker connector system.  3.  Posterolateral arthrodesis T10-11, T11-12, T12-L1, L1-L2 bilaterally utilizing locally harvested autograft mixed with Vivigen and BMP.  Surgeon: Donalee Citrin.  Assistant: Julien Girt.  Anesthesia: General.  EBL: 250.  HPI: 67 year old female previously undergone L2-S1 fusion over successive operations presented with a disc ideation and instability and spinal stenosis at L1-L2 above this old fusion as well as severe degenerative disc disease Modic changes and instability at T11-12.  Due to the patient's progression of clinical syndrome weakness in her left leg failed conservative treatment I recommended decompressive laminectomy discectomy and possible interbody fusion L1-L2 as well as fusion extending up to T10 due to her severe degenerative changes at T11-12 I extensively went over the risks and benefits of the operation with her as well as perioperative course expectations of outcome and alternatives of surgery and she understood and agreed to proceed forward.  Operative procedure: Patient was brought into the OR was induced under general anesthesia and positioned prone on flat rolls her back was prepped and draped in routine sterile fashion her superior aspect of rolled incision was opened up and extended cephalad after infiltration of 10 cc lidocaine with epi and subperiosteal dissection carried lamina of T9-10 T11 T12-L1  exposed the hardware at 2 3.  First attention taken a pedicle screw placement utilizing AP and lateral fluoroscopy pedicle screws were placed at T10, T11, T12 and L1 bilaterally then post imaging and fluoroscopy confirmed good position of pedicle screws I then proceeded to form the decompression extensive scar tissue was all was removed decompressive laminectomy was performed complete medial facetectomies erotic foraminotomies the L1-L2 nerve root there was marked hourglass compression of thecal sac extensive scar tissue causing for stenosis of the L2 nerve root.  There was a large disc herniation with a large free fragment superior to the disc base underneath the L1 nerve root this was all removed discectomy was performed on the left removing with several large fragments of disc from under the thecal sac underneath the L1 nerve root and from within the disc base.  Due to the her anatomy was did not have enough room to perform an interbody fusion at that level so I just perform wide decompressive laminectomies and foraminotomies.  As well as removal of the disc.  Then we attempted to connect up with the Stryker connector system I did have to ultimately removed the left-sided L1 screw as it would not marry up to the connector system very well however we were able to anchor everything else in place and tightened everything down.  I then aggressively decorticated the laminar TP and facet complexes at T10-11 T11-12 T12-L1 and L1-L2 and the TPs at L1-L2.  I packed an extensive mount of BMP and autograft mix posterior laterally then inspected my foramina to confirm patency and laid Gelfoam atop the dura placed a medium Hemovac drain injected Exparel in the fascia placed and then closed the wound in layers with active Vicryl and a running 4 subcuticular.  Dermabond benzoin Steri-Strips and sterile dressing was applied patient recovery  in stable condition.  At the end the case all needle count sponge counts were correct.   My assistant was present and assisted with all critical parts of the decompression and fusion.

## 2023-01-23 NOTE — Progress Notes (Signed)
PHARMACIST - PHYSICIAN ORDER COMMUNICATION  CONCERNING: P&T Medication Policy on Herbal Medications  DESCRIPTION:  This patient's order for:  Coenzyme Q10  has been noted.  This product(s) is classified as an "herbal" or natural product. Due to a lack of definitive safety studies or FDA approval, nonstandard manufacturing practices, plus the potential risk of unknown drug-drug interactions while on inpatient medications, the Pharmacy and Therapeutics Committee does not permit the use of "herbal" or natural products of this type within [facility].   ACTION TAKEN: The pharmacy department is unable to verify this order at this time and your patient has been informed of this safety policy. Please reevaluate patient's clinical condition at discharge and address if the herbal or natural product(s) should be resumed at that time.  Christoper Fabian, PharmD, BCPS Please see amion for complete clinical pharmacist phone list 01/23/2023 8:15 PM

## 2023-01-23 NOTE — Anesthesia Postprocedure Evaluation (Signed)
Anesthesia Post Note  Patient: Tamara Nunez  Procedure(s) Performed: Thoracic eleven-Thoracic twelve - Thoracic twelve-Lumbar one - Lumbar one-Lumbar two extension of fusion, Lumbar two-three pedicle screws and rods and Posterior Lumbar Interbody Fusion at Lumbar one-Lumbar two - Posterior Lateral and Interbody fusion (Back)     Patient location during evaluation: PACU Anesthesia Type: General Level of consciousness: sedated, patient cooperative and oriented Pain management: pain level controlled Vital Signs Assessment: post-procedure vital signs reviewed and stable Respiratory status: spontaneous breathing, nonlabored ventilation and respiratory function stable Cardiovascular status: blood pressure returned to baseline and stable Postop Assessment: no apparent nausea or vomiting Anesthetic complications: no   No notable events documented.  Last Vitals:  Vitals:   01/23/23 1945 01/23/23 2007  BP: (!) 139/55 128/60  Pulse: 89 83  Resp: 16 18  Temp: 36.8 C 36.6 C  SpO2: 94% 95%    Last Pain:  Vitals:   01/23/23 2007  TempSrc: Oral  PainSc:                  [name],E. Inna Tisdell

## 2023-01-23 NOTE — Transfer of Care (Signed)
Immediate Anesthesia Transfer of Care Note  Patient: Tamara Nunez  Procedure(s) Performed: Thoracic eleven-Thoracic twelve - Thoracic twelve-Lumbar one - Lumbar one-Lumbar two extension of fusion, Lumbar two-three pedicle screws and rods and Posterior Lumbar Interbody Fusion at Lumbar one-Lumbar two - Posterior Lateral and Interbody fusion (Back)  Patient Location: PACU  Anesthesia Type:General  Level of Consciousness: awake and alert   Airway & Oxygen Therapy: Patient Spontanous Breathing  Post-op Assessment: Report given to RN and Post -op Vital signs reviewed and stable  Post vital signs: Reviewed and stable  Last Vitals:  Vitals Value Taken Time  BP 151/70 01/23/23 1815  Temp    Pulse 92 01/23/23 1816  Resp 16 01/23/23 1816  SpO2 97 % 01/23/23 1816  Vitals shown include unfiled device data.  Last Pain:  Vitals:   01/23/23 1054  PainSc: 9          Complications: No notable events documented.

## 2023-01-23 NOTE — H&P (Signed)
Tamara Nunez is an 67 y.o. female.   Chief Complaint: Back bilateral hip and leg pain 63 HPI: 67 year old female longstanding issues with her back previous L2-S1 fusion presents with segmental degeneration above her fusion with severe spinal stenosis with discrimination L1-L2 and marked degenerative disc disease with Modic changes and instability at T11-12.  Due to patient's progression of clinical syndrome imaging findings and failed conservative treatment I recommended expiration of fusion removal hardware decompression L1-L2 with extension of her construct up to T10.  I have extensively gone over the risk and benefits of that operation with her as well as perioperative course expectations of outcome and alternatives to surgery and she understood and agreed to proceed forward.  Past Medical History:  Diagnosis Date   Arthritis    "in my back" (05/06/2017)   CAD (coronary artery disease)    CABG 2006, 09/2016 DES RCA, 04/2017 DES Circ, 06/2018 DES 1st diag   Chronic back pain    Coronary artery disease    a. s/p CABG in 2006 with LIMA-LAD, SVG-dLAD. b. cath in 2009 showing patent grafts. c. 09/2016: cath with occluded LAD but patent SVG-dLAD. LIMA was atretic. pRCA with 80% stenosis --> treated with DES   Depression    Fibromyalgia    GERD (gastroesophageal reflux disease)    Hyperlipidemia    Hypertension    Type II diabetes mellitus (HCC)     Past Surgical History:  Procedure Laterality Date   ABDOMINAL HERNIA REPAIR  2018   BACK SURGERY     CARDIAC CATHETERIZATION  01/13/2008   COLONOSCOPY     CORONARY ANGIOPLASTY WITH STENT PLACEMENT  09/26/2016   Prox RCA lesion, 80 %stenosed. DES    CORONARY ARTERY BYPASS GRAFT  12/23/2004   2 vessel - LIMA to LAD (atretic), SVG to distal LAD   CORONARY ATHERECTOMY N/A 08/16/2019   Procedure: CORONARY ATHERECTOMY;  Surgeon: Swaziland, Peter M, MD;  Location: [facility] INVASIVE CV LAB;  Service: Cardiovascular;  Laterality: N/A;   CORONARY STENT INTERVENTION  N/A 09/26/2016   Procedure: Coronary Stent Intervention;  Surgeon: Marykay Lex, MD;  Location: [facility] INVASIVE CV LAB;  Service: Cardiovascular;  Laterality: N/A;   CORONARY STENT INTERVENTION N/A 05/06/2017   Procedure: CORONARY STENT INTERVENTION;  Surgeon: Kathleene Hazel, MD;  Location: [facility] INVASIVE CV LAB;  Service: Cardiovascular;  Laterality: N/A;   CORONARY STENT INTERVENTION N/A 06/25/2018   Procedure: CORONARY STENT INTERVENTION;  Surgeon: Swaziland, Peter M, MD;  Location: [facility] INVASIVE CV LAB;  Service: Cardiovascular;  Laterality: N/A;   CORONARY STENT INTERVENTION N/A 08/12/2019   Procedure: CORONARY STENT INTERVENTION;  Surgeon: Lyn Records, MD;  Location: [facility] INVASIVE CV LAB;  Service: Cardiovascular;  Laterality: N/A;   CORONARY STENT INTERVENTION N/A 08/16/2019   Procedure: CORONARY STENT INTERVENTION;  Surgeon: Swaziland, Peter M, MD;  Location: [facility] INVASIVE CV LAB;  Service: Cardiovascular;  Laterality: N/A;   DOPPLER ECHOCARDIOGRAPHY  04/12/2008   EF 60%   GASTRIC BYPASS  2009   HERNIA REPAIR     LEFT HEART CATH AND CORONARY ANGIOGRAPHY N/A 05/06/2017   Procedure: LEFT HEART CATH AND CORONARY ANGIOGRAPHY;  Surgeon: Kathleene Hazel, MD;  Location: [facility] INVASIVE CV LAB;  Service: Cardiovascular;  Laterality: N/A;   LEFT HEART CATH AND CORS/GRAFTS ANGIOGRAPHY N/A 09/26/2016   Procedure: Left Heart Cath and Cors/Grafts Angiography;  Surgeon: Marykay Lex, MD;  Location: [facility] INVASIVE CV LAB;  Service: Cardiovascular;  Laterality: N/A;   LEFT HEART CATH AND  CORS/GRAFTS ANGIOGRAPHY N/A 06/25/2018   Procedure: LEFT HEART CATH AND CORS/GRAFTS ANGIOGRAPHY;  Surgeon: Swaziland, Peter M, MD;  Location: [facility] INVASIVE CV LAB;  Service: Cardiovascular;  Laterality: N/A;   LEFT HEART CATH AND CORS/GRAFTS ANGIOGRAPHY N/A 08/12/2019   Procedure: LEFT HEART CATH AND CORS/GRAFTS ANGIOGRAPHY;  Surgeon: Lyn Records, MD;  Location: [facility] INVASIVE CV LAB;  Service: Cardiovascular;  Laterality: N/A;   LEFT  HEART CATH AND CORS/GRAFTS ANGIOGRAPHY N/A 03/08/2020   Procedure: LEFT HEART CATH AND CORS/GRAFTS ANGIOGRAPHY;  Surgeon: Swaziland, Peter M, MD;  Location: [facility] INVASIVE CV LAB;  Service: Cardiovascular;  Laterality: N/A;   NM MYOVIEW LTD  06/03/2012   EF 71%   POSTERIOR FUSION LUMBAR SPINE  1990s   TOTAL KNEE ARTHROPLASTY Left 02/26/2021   Procedure: TOTAL KNEE ARTHROPLASTY;  Surgeon: Durene Romans, MD;  Location: [facility] ORS;  Service: Orthopedics;  Laterality: Left;   TOTAL KNEE ARTHROPLASTY Right 08/13/2021   Procedure: TOTAL KNEE ARTHROPLASTY;  Surgeon: Durene Romans, MD;  Location: [facility] ORS;  Service: Orthopedics;  Laterality: Right;   TUBAL LIGATION     VAGINAL HYSTERECTOMY     "partial"    Family History  Problem Relation Age of Onset   Heart disease Mother    Cancer - Lung Father    Social History:  reports that she has never smoked. She has never used smokeless tobacco. She reports that she does not drink alcohol and does not use drugs.  Allergies:  Allergies  Allergen Reactions   Codeine Hives and Shortness Of Breath        Other Anaphylaxis    Nuts   Ciprofloxacin Itching   Valsartan Rash   Adhesive [Tape] Itching    "clear tape" used after surgery."took all her skin off".  Paper take is ok   Sulfa Antibiotics Nausea Only    Facility-Administered Medications Prior to Admission  Medication Dose Route Frequency Provider Last Rate Last Admin   sodium chloride flush (NS) 0.9 % injection 3 mL  3 mL Intravenous Q12H Azalee Course, PA       Medications Prior to Admission  Medication Sig Dispense Refill   acetaminophen (TYLENOL) 500 MG tablet Take 2 tablets (1,000 mg total) by mouth every 6 (six) hours. 30 tablet 0   albuterol (VENTOLIN HFA) 108 (90 Base) MCG/ACT inhaler Inhale 1 puff into the lungs every 6 (six) hours as needed for wheezing.     amLODipine (NORVASC) 2.5 MG tablet Take 1 tablet (2.5 mg total) by mouth daily. 90 tablet 3   aspirin EC 81 MG tablet Take 81 mg by mouth  daily. Swallow whole.     atorvastatin (LIPITOR) 40 MG tablet Take 1 tablet (40 mg total) by mouth daily. 30 tablet 6   BRILINTA 60 MG TABS tablet TAKE 1 TABLET BY MOUTH TWICE  DAILY 200 tablet 2   cetirizine (ZYRTEC) 10 MG tablet Take 10 mg by mouth daily.     Cholecalciferol 50 MCG (2000 UT) CAPS Take 1 capsule (2,000 Units total) by mouth daily with breakfast. 90 capsule 1   Coenzyme Q10 (CO Q-10) 200 MG CAPS Take 200 mg by mouth daily.     Cyanocobalamin (VITAMIN B-12 PO) Take 3 tablets by mouth daily.     EPINEPHrine 0.3 mg/0.3 mL IJ SOAJ injection Inject 0.3 mg into the muscle as needed for anaphylaxis.     fluticasone (FLONASE) 50 MCG/ACT nasal spray Place 1 spray into both nostrils 2 (two) times daily.     furosemide (  LASIX) 40 MG tablet Take 40 mg by mouth daily as needed for fluid or edema.     gabapentin (NEURONTIN) 600 MG tablet Take 600 mg by mouth in the morning, at noon, in the evening, and at bedtime.     Iron-Vitamin C (VITRON-C PO) Take 1 tablet by mouth daily.     isosorbide mononitrate (IMDUR) 60 MG 24 hr tablet Take 1 tablet (60 mg total) by mouth daily. 90 tablet 3   lidocaine (LIDODERM) 5 % Place 1 patch onto the skin daily as needed. Remove & Discard patch within 12 hours or as directed by MD (Patient taking differently: Place 2 patches onto the skin See admin instructions. Put 2 patches on back 1-2 times a day as needed for pain) 15 patch 0   metoprolol tartrate (LOPRESSOR) 50 MG tablet Take 1 tablet (50 mg total) by mouth 2 (two) times daily. 180 tablet 2   Multiple Vitamin (MULTIVITAMIN WITH MINERALS) TABS tablet Take 2 tablets by mouth daily.     nitroGLYCERIN (NITROSTAT) 0.4 MG SL tablet Place 1 tablet (0.4 mg total) under the tongue every 5 (five) minutes as needed for chest pain (up to 3 doses). (Patient taking differently: Place 0.4 mg under the tongue every 5 (five) minutes as needed for chest pain.) 25 tablet 3   ranolazine (RANEXA) 1000 MG SR tablet Take 1 tablet  (1,000 mg total) by mouth 2 (two) times daily. 56 tablet 0   traMADol (ULTRAM) 50 MG tablet Take 50 mg by mouth as needed.     vitamin E 400 UNIT capsule Take 400 Units by mouth daily.     Accu-Chek Softclix Lancets lancets USE TO TEST BLOOD GLUCOSE TWICE  DAILY 200 each 2   Blood Glucose Monitoring Suppl (ACCU-CHEK GUIDE ME) w/Device KIT Use to test BG qid 1 kit 0   cyclobenzaprine (FLEXERIL) 10 MG tablet Take 1 tablet (10 mg total) by mouth 3 (three) times daily as needed for muscle spasms. 30 tablet 0   diclofenac sodium (VOLTAREN) 1 % GEL Apply 1 application. topically in the morning and at bedtime.     dicyclomine (BENTYL) 10 MG capsule Take 10 mg by mouth every 6 (six) hours as needed for spasms.     Docusate Calcium (STOOL SOFTENER PO) Take 1 capsule by mouth daily.     Fingerstix Lancets MISC Inject into the skin.     glucose blood (ACCU-CHEK GUIDE) test strip USE TO CHECK BLOOD GLUCOSE DAILY AS DIRECTED 100 strip 2   glucose blood (ACCU-CHEK GUIDE) test strip for testing as directed Dx E11.65 200 strip 3   glucose blood (ACCU-CHEK GUIDE) test strip 1 each by Other route in the morning and at bedtime. Test BG bid. Dx E11.65 200 each 2   methocarbamol (ROBAXIN) 500 MG tablet Take 1 tablet (500 mg total) by mouth every 8 (eight) hours as needed for muscle spasms. 20 tablet 0   pantoprazole (PROTONIX) 40 MG tablet Take 40 mg by mouth 2 (two) times daily as needed (acid reflux).     polyethylene glycol (MIRALAX / GLYCOLAX) 17 g packet Take 17 g by mouth daily as needed for mild constipation. 14 each 0    Results for orders placed or performed during the hospital encounter of 01/23/23 (from the past 48 hour(s))  Glucose, capillary     Status: None   Collection Time: 01/23/23 10:16 AM  Result Value Ref Range   Glucose-Capillary 78 70 - 99 mg/dL    Comment:  Glucose reference range applies only to samples taken after fasting for at least 8 hours.  Type and screen [facility]      Status: None   Collection Time: 01/23/23 10:50 AM  Result Value Ref Range   ABO/RH(D) O POS    Antibody Screen NEG    Sample Expiration      01/26/2023,2359 Performed at [facility], [address] [place], [place] [address]   Glucose, capillary     Status: None   Collection Time: 01/23/23 12:19 PM  Result Value Ref Range   Glucose-Capillary 72 70 - 99 mg/dL    Comment: Glucose reference range applies only to samples taken after fasting for at least 8 hours.   No results found.  Review of Systems  Musculoskeletal:  Positive for back pain.  Neurological:  Positive for numbness.    Blood pressure (!) 155/56, pulse (!) 55, temperature (!) 97.5 F (36.4 C), resp. rate 18, height 5\' 4"  (1.626 m), weight 78 kg, SpO2 96%. Physical Exam HENT:     Head: Normocephalic.     Right Ear: Tympanic membrane normal.     Nose: Nose normal.  Eyes:     Pupils: Pupils are equal, round, and reactive to light.  Cardiovascular:     Rate and Rhythm: Normal rate.     Pulses: Normal pulses.  Pulmonary:     Effort: Pulmonary effort is normal.  Abdominal:     General: Abdomen is flat.  Musculoskeletal:        General: Normal range of motion.     Cervical back: Normal range of motion.  Neurological:     Mental Status: She is alert.     Comments: Diffuse, upper proximal lower extremity weakness at 4+ out of 5 but otherwise distally 5 out of 5      Assessment/Plan Extent-year-old presents for decompression fusion L1-L2 and extension of her hardware up to T10  Mariam Dollar, MD 01/23/2023, 12:44 PM

## 2023-01-24 LAB — GLUCOSE, CAPILLARY
Glucose-Capillary: 106 mg/dL — ABNORMAL HIGH (ref 70–99)
Glucose-Capillary: 92 mg/dL (ref 70–99)
Glucose-Capillary: 107 mg/dL — ABNORMAL HIGH (ref 70–99)
Glucose-Capillary: 127 mg/dL — ABNORMAL HIGH (ref 70–99)

## 2023-01-24 NOTE — Progress Notes (Signed)
RN advised patient of order to remove foley. Patient stated that she would prefer to have it removed after seeing physical therapy.

## 2023-01-24 NOTE — Progress Notes (Signed)
Patient requested to get foley removed only after working with physical therapy.

## 2023-01-24 NOTE — Evaluation (Signed)
Occupational Therapy Evaluation Patient Details Name: Tamara Nunez MRN: [id number] DOB: 1955/09/01 Today's Date: 01/24/2023   History of Present Illness 67 yo female with c/o bilateral hip and leg pain, she has longstanding issues with her back from previous L2-S1 Fusion and presents with  segmental degeneration above her fusion with severe spinal stenosis with discrimination L1-L2 and marked degenerative disc disease with Modic changes and instability at T11-12. She is now s/p T11-L2 extension of fusion and PLIF L1-L2 on 01/23/23. PMHx of Arthritis, CAD, Fibromyalgia, GERD, HLD, HTN, DM II, L TKA, R TKA.   Clinical Impression   Pt presenting with strong post op back pain but willing to attempt therapy session. Pt needing Total to CGA for ADLs but would benefit from further assessment as full ADL assessment was limited by pain, she was able to transfer with Min A + RW but felt better with +2 assist from support of family. Cues needed for pt to exhale instead of holding her breath. Pt would benefit from continued acute skilled OT services to address deficits and help transition to next level of care. Patient would benefit from post acute skilled rehab facility with <3 hours of therapy and 24/7 support       If plan is discharge home, recommend the following: A little help with walking and/or transfers;A lot of help with bathing/dressing/bathroom;Assistance with cooking/housework;Assist for transportation    Functional Status Assessment  Patient has had a recent decline in their functional status and demonstrates the ability to make significant improvements in function in a reasonable and predictable amount of time.  Equipment Recommendations  None recommended by OT (defer to next level of care)    Recommendations for Other Services       Precautions / Restrictions Precautions Precautions: Back Precaution Booklet Issued: Yes (comment) Precaution Comments: Review back  precautions Restrictions Weight Bearing Restrictions: No      Mobility Bed Mobility Overal bed mobility: Needs Assistance Bed Mobility: Sidelying to Sit   Sidelying to sit: Mod assist       General bed mobility comments: Pt sidelying on arrival, reviewed log roll. Mod A to raise trunk    Transfers Overall transfer level: Needs assistance Equipment used: 1 person hand held assist Transfers: Sit to/from Stand, Bed to chair/wheelchair/BSC Sit to Stand: Min assist Stand pivot transfers: Min assist, +2 safety/equipment                Balance Overall balance assessment: Needs assistance Sitting-balance support: Feet supported, No upper extremity supported Sitting balance-Leahy Scale: Fair Sitting balance - Comments: EOB sitting   Standing balance support: Reliant on assistive device for balance, During functional activity, Bilateral upper extremity supported Standing balance-Leahy Scale: Poor                             ADL either performed or assessed with clinical judgement   ADL Overall ADL's : Needs assistance/impaired Eating/Feeding: Independent;Bed level   Grooming: Sitting;Contact guard assist   Upper Body Bathing: Sitting;Contact guard assist   Lower Body Bathing: Sitting/lateral leans;Maximal assistance   Upper Body Dressing : Sitting;Minimal assistance   Lower Body Dressing: Sitting/lateral leans;Total assistance   Toilet Transfer: +2 for safety/equipment;Minimal assistance;Rolling walker (2 wheels);Stand-pivot Statistician Details (indicate cue type and reason): simulatd with recliner transfer Toileting- Clothing Manipulation and Hygiene: Sit to/from stand;Moderate assistance       Functional mobility during ADLs: Minimal assistance;Rolling walker (2 wheels);+2 for safety/equipment General ADL  Comments: LB ADLs need further assessment, pt limited by pain. Clinical judgement made to not fully assess LB ADLs     Vision          Perception         Praxis         Pertinent Vitals/Pain Pain Assessment Pain Assessment: 0-10 Pain Score: 9  Pain Location: back, incision site Pain Descriptors / Indicators: Aching, Sore, Sharp, Discomfort, Grimacing, Guarding Pain Intervention(s): Limited activity within patient's tolerance, Monitored during session, Premedicated before session (pt reports having pain meds before OT session)     Extremity/Trunk Assessment Upper Extremity Assessment Upper Extremity Assessment: Generalized weakness   Lower Extremity Assessment Lower Extremity Assessment: Defer to PT evaluation   Cervical / Trunk Assessment Cervical / Trunk Assessment: Back Surgery   Communication Communication Communication: No apparent difficulties   Cognition Arousal: Alert Behavior During Therapy: WFL for tasks assessed/performed Overall Cognitive Status: Within Functional Limits for tasks assessed                                       General Comments  pt family in room helping assist and keep pt relaxed during transfer    Exercises     Shoulder Instructions      Home Living Family/patient expects to be discharged to:: Skilled nursing facility Living Arrangements: Spouse/significant other                               Additional Comments: spouse drives trucks, does not think she can DC home      Prior Functioning/Environment Prior Level of Function : Independent/Modified Independent             Mobility Comments: ind with SPC ADLs Comments: ind        OT Problem List: Pain;Decreased knowledge of precautions;Decreased strength;Impaired balance (sitting and/or standing)      OT Treatment/Interventions: Self-care/ADL training;Balance training;Therapeutic exercise;Therapeutic activities;Patient/family education;DME and/or AE instruction    OT Goals(Current goals can be found in the care plan section) Acute Rehab OT Goals Patient Stated Goal: To  figure out where to go from here OT Goal Formulation: With patient Time For Goal Achievement: 02/07/23 Potential to Achieve Goals: Good ADL Goals Pt Will Perform Grooming: with supervision;standing Pt Will Perform Lower Body Bathing: with set-up;with supervision;sitting/lateral leans Pt Will Perform Lower Body Dressing: with set-up;with supervision;sitting/lateral leans Pt Will Transfer to Toilet: with supervision;stand pivot transfer Additional ADL Goal #1: Pt will verbally recall 3/3 back precautions to promote safe ADL performance following surgery  OT Frequency: Min 1X/week    Co-evaluation              AM-PAC OT "6 Clicks" Daily Activity     Outcome Measure Help from another person eating meals?: None Help from another person taking care of personal grooming?: A Little Help from another person toileting, which includes using toliet, bedpan, or urinal?: A Lot Help from another person bathing (including washing, rinsing, drying)?: A Lot Help from another person to put on and taking off regular upper body clothing?: A Little Help from another person to put on and taking off regular lower body clothing?: A Lot 6 Click Score: 16   End of Session Equipment Utilized During Treatment: Rolling walker (2 wheels) Nurse Communication: Mobility status  Activity Tolerance: Patient tolerated treatment well Patient left: in chair;with  call bell/phone within reach;with family/visitor present  OT Visit Diagnosis: Unsteadiness on feet (R26.81);Other abnormalities of gait and mobility (R26.89);Pain;Muscle weakness (generalized) (M62.81) Pain - part of body:  (back)                Time: 0928-1000 OT Time Calculation (min): 32 min Charges:  OT General Charges $OT Visit: 1 Visit OT Evaluation $OT Eval Moderate Complexity: 1 Mod OT Treatments $Therapeutic Activity: 8-22 mins  01/24/2023  AB, OTR/L  Acute Rehabilitation Services  Office: [phone]   Tristan Schroeder 01/24/2023,  11:22 AM

## 2023-01-24 NOTE — Evaluation (Signed)
Physical Therapy Evaluation Patient Details Name: Tamara Nunez MRN: [id number] DOB: 08-Mar-1956 Today's Date: 01/24/2023  History of Present Illness  67 yo female with c/o bilateral hip and leg pain, she has longstanding issues with her back from previous L2-S1 Fusion and presents with  segmental degeneration above her fusion with severe spinal stenosis with discrimination L1-L2 and marked degenerative disc disease with Modic changes and instability at T11-12. She is now s/p T11-L2 extension of fusion and PLIF L1-L2 on 01/23/23. PMHx of Arthritis, CAD, Fibromyalgia, GERD, HLD, HTN, DM II, L TKA, R TKA.  Clinical Impression  Pt admitted s/p procedure listed above. Presents with decreased functional mobility secondary to pain, weakness, impaired standing balance, postural abnormalities. Pt requiring minA for transfers and ambulating ~15 ft with a walker at a CGA level. Suspect steady progress with pain control, however, anticipate pt will benefit from post acute rehab to address deficits and maximize functional mobility due to decreased caregiver support.         If plan is discharge home, recommend the following: A little help with walking and/or transfers;A little help with bathing/dressing/bathroom;Assistance with cooking/housework;Assist for transportation;Help with stairs or ramp for entrance   Can travel by private vehicle   Yes    Equipment Recommendations Rolling walker (2 wheels);BSC/3in1  Recommendations for Other Services       Functional Status Assessment Patient has had a recent decline in their functional status and demonstrates the ability to make significant improvements in function in a reasonable and predictable amount of time.     Precautions / Restrictions Precautions Precautions: Back;Fall Precaution Booklet Issued: Yes (comment) Precaution Comments: hemovac Restrictions Weight Bearing Restrictions: No      Mobility  Bed Mobility Overal bed mobility: Needs  Assistance Bed Mobility: Sit to Sidelying         Sit to sidelying: Mod assist General bed mobility comments: Cues for log roll technique, assist for BLE's back into bed    Transfers Overall transfer level: Needs assistance Equipment used: Rolling walker (2 wheels) Transfers: Sit to/from Stand Sit to Stand: Min assist           General transfer comment: Slow to rise, minA to boost up    Ambulation/Gait Ambulation/Gait assistance: Contact guard assist Gait Distance (Feet): 15 Feet Assistive device: Rolling walker (2 wheels) Gait Pattern/deviations: Step-through pattern, Decreased dorsiflexion - right, Decreased dorsiflexion - left, Decreased stride length Gait velocity: decreased Gait velocity interpretation: <1.31 ft/sec, indicative of household ambulator   General Gait Details: Slow and effortful pace  Stairs            Wheelchair Mobility     Tilt Bed    Modified Rankin (Stroke Patients Only)       Balance Overall balance assessment: Needs assistance Sitting-balance support: Feet supported, No upper extremity supported Sitting balance-Leahy Scale: Fair     Standing balance support: Reliant on assistive device for balance, During functional activity, Bilateral upper extremity supported Standing balance-Leahy Scale: Poor                               Pertinent Vitals/Pain Pain Assessment Pain Assessment: Faces Faces Pain Scale: Hurts whole lot Pain Location: back, incision site Pain Descriptors / Indicators: Aching, Sore, Sharp, Discomfort, Grimacing, Guarding Pain Intervention(s): Monitored during session, Limited activity within patient's tolerance, Premedicated before session    Home Living Family/patient expects to be discharged to:: Skilled nursing facility Living Arrangements: Spouse/significant other  Additional Comments: spouse drives trucks, does not think she can DC home    Prior Function Prior  Level of Function : Independent/Modified Independent             Mobility Comments: ind with [facility]       Extremity/Trunk Assessment   Upper Extremity Assessment Upper Extremity Assessment: Defer to OT evaluation    Lower Extremity Assessment Lower Extremity Assessment: RLE deficits/detail;LLE deficits/detail RLE Deficits / Details: Ankle dorsiflexion 4/5 LLE Deficits / Details: Ankle dorsiflexion 2/5    Cervical / Trunk Assessment Cervical / Trunk Assessment: Back Surgery  Communication   Communication Communication: No apparent difficulties  Cognition Arousal: Alert Behavior During Therapy: WFL for tasks assessed/performed Overall Cognitive Status: Within Functional Limits for tasks assessed                                          General Comments General comments (skin integrity, edema, etc.): pt family in room helping assist and keep pt relaxed during transfer    Exercises     Assessment/Plan    PT Assessment Patient needs continued PT services  PT Problem List Decreased strength;Decreased activity tolerance;Decreased mobility;Decreased balance;Pain       PT Treatment Interventions DME instruction;Gait training;Stair training;Functional mobility training;Therapeutic activities;Therapeutic exercise;Balance training;Patient/family education    PT Goals (Current goals can be found in the Care Plan section)  Acute Rehab PT Goals Patient Stated Goal: less pain PT Goal Formulation: With patient Time For Goal Achievement: 02/07/23 Potential to Achieve Goals: Good    Frequency Min 5X/week     Co-evaluation               AM-PAC PT "6 Clicks" Mobility  Outcome Measure Help needed turning from your back to your side while in a flat bed without using bedrails?: A Little Help needed moving from lying on your back to sitting on the side of a flat bed without using bedrails?: A Lot Help needed moving to and from a bed to a chair (including a  wheelchair)?: A Little Help needed standing up from a chair using your arms (e.g., wheelchair or bedside chair)?: A Little Help needed to walk in hospital room?: A Little Help needed climbing 3-5 steps with a railing? : A Lot 6 Click Score: 16    End of Session   Activity Tolerance: Patient tolerated treatment well Patient left: in bed;with call bell/phone within reach Nurse Communication: Mobility status PT Visit Diagnosis: Difficulty in walking, not elsewhere classified (R26.2);Pain Pain - part of body:  (back)    Time: 1610-9604 PT Time Calculation (min) (ACUTE ONLY): 27 min   Charges:   PT Evaluation $PT Eval Low Complexity: 1 Low PT Treatments $Therapeutic Activity: 8-22 mins PT General Charges $$ ACUTE PT VISIT: 1 Visit         Lillia Pauls, PT, DPT Acute Rehabilitation Services Office [phone]   Norval Morton 01/24/2023, 2:18 PM

## 2023-01-24 NOTE — Plan of Care (Signed)
  Problem: Activity: Goal: Risk for activity intolerance will decrease Outcome: Progressing   Problem: Nutrition: Goal: Adequate nutrition will be maintained Outcome: Progressing   Problem: Coping: Goal: Level of anxiety will decrease Outcome: Progressing   

## 2023-01-24 NOTE — Plan of Care (Signed)
  Problem: Education: Goal: Knowledge of General Education information will improve Description: Including pain rating scale, medication(s)/side effects and non-pharmacologic comfort measures Outcome: Progressing   Problem: Activity: Goal: Risk for activity intolerance will decrease Outcome: Progressing   Problem: Pain Management: Goal: General experience of comfort will improve Outcome: Progressing

## 2023-01-24 NOTE — Progress Notes (Signed)
   Providing Compassionate, Quality Care - Together   Subjective: Patient reports moderate amount of surgical pain.  Objective: Vital signs in last 24 hours: Temp:  [97.8 F (36.6 C)-98.7 F (37.1 C)] 98.6 F (37 C) (11/16 0832) Pulse Rate:  [73-98] 77 (11/16 0832) Resp:  [11-20] 14 (11/16 0832) BP: (104-160)/(41-71) 108/41 (11/16 0832) SpO2:  [94 %-98 %] 97 % (11/16 0832)  Intake/Output from previous day: 11/15 0701 - 11/16 0700 In: 1500 [I.V.:900; IV Piggyback:600] Out: 2000 [Urine:1725; Drains:75; Blood:200] Intake/Output this shift: Total I/O In: 120 [P.O.:120] Out: -   Alert and oriented x 4 PERRLA CN II-XII grossly intact MAE, Sensation intact, generalized weakness/guarding due to pain Incision is covered with Honeycomb dressing and Steri Strips; Dressing is clean, dry, and intact   Lab Results: No results for input(s): "WBC", "HGB", "HCT", "PLT" in the last 72 hours. BMET No results for input(s): "NA", "K", "CL", "CO2", "GLUCOSE", "BUN", "CREATININE", "CALCIUM" in the last 72 hours.  Studies/Results: DG THORACOLUMBAR SPINE  Result Date: 01/23/2023 CLINICAL DATA:  Fusion EXAM: THORACOLUMBAR SPINE 1V COMPARISON:  MRI 12/27/2022 FINDINGS: Postoperative changes from posterior fusion from T10-L5 (proximal extension from prior L2-5 fusion). No hardware or bony complicating feature. IMPRESSION: Posterior fusion changes.  No visible complicating feature. Electronically Signed   By: Charlett Nose M.D.   On: 01/23/2023 22:05   DG THORACOLUMBAR SPINE  Result Date: 01/23/2023 CLINICAL DATA:  Elective surgery. EXAM: THORACOLUMBAR SPINE 1V COMPARISON:  None Available. FINDINGS: Two fluoroscopic spot views of the thoracolumbar spine obtained in the operating room. Multilevel pedicle screws in place. Levels difficult to delineate due to coned view. Fluoroscopy time 83.4 seconds. Dose 33.1 mGy IMPRESSION: Intraoperative fluoroscopy during thoracolumbar spine surgery.  Electronically Signed   By: Narda Rutherford M.D.   On: 01/23/2023 17:44   DG C-Arm 1-60 Min-No Report  Result Date: 01/23/2023 Fluoroscopy was utilized by the requesting physician.  No radiographic interpretation.   DG C-Arm 1-60 Min-No Report  Result Date: 01/23/2023 Fluoroscopy was utilized by the requesting physician.  No radiographic interpretation.    Assessment/Plan: Patient underwent decompressive laminectomy at L1-2, with extension of her posterior lateral hardware from T10 to L2 by Dr. Wynetta Emery on 01/23/2023. She is having the anticipated amount of postoperative pain. We will continue to mobilize and work on pain control while formulating her discharge plan.   LOS: 1 day    Val Eagle, DNP, AGNP-C Nurse Practitioner  [facility] [address], [place], [place] [address] P: [phone]    F: [phone]  01/24/2023, 12:01 PM

## 2023-01-25 LAB — GLUCOSE, CAPILLARY
Glucose-Capillary: 81 mg/dL (ref 70–99)
Glucose-Capillary: 89 mg/dL (ref 70–99)
Glucose-Capillary: 102 mg/dL — ABNORMAL HIGH (ref 70–99)
Glucose-Capillary: 66 mg/dL — ABNORMAL LOW (ref 70–99)
Glucose-Capillary: 163 mg/dL — ABNORMAL HIGH (ref 70–99)

## 2023-01-25 LAB — CBC
HCT: 24.5 % — ABNORMAL LOW (ref 36.0–46.0)
Hemoglobin: 7.9 g/dL — ABNORMAL LOW (ref 12.0–15.0)
MCH: 29.9 pg (ref 26.0–34.0)
MCHC: 32.2 g/dL (ref 30.0–36.0)
MCV: 92.8 fL (ref 80.0–100.0)
Platelets: 215 10*3/uL (ref 150–400)
RBC: 2.64 MIL/uL — ABNORMAL LOW (ref 3.87–5.11)
RDW: 14.5 % (ref 11.5–15.5)
WBC: 8.6 10*3/uL (ref 4.0–10.5)
nRBC: 0 % (ref 0.0–0.2)

## 2023-01-25 MED ORDER — LACTATED RINGERS IV BOLUS
500.0000 mL | Freq: Once | INTRAVENOUS | Status: AC
  Administered 2023-01-25: 500 mL via INTRAVENOUS

## 2023-01-25 NOTE — TOC Initial Note (Signed)
Transition of Care [facility]) - Initial/Assessment Note    Patient Details  Name: Tamara Nunez MRN: [id number] Date of Birth: Jan 02, 1956  Transition of Care [facility]) CM/SW Contact:    Baldemar Lenis, LCSW Phone Number: 01/25/2023, 11:11 AM  Clinical Narrative:    Patient from home with spouse, in agreement with recommendation for SNF placement. Patient would prefer [facility]. CSW completed referral, will contact Tamara Nunez Admissions tomorrow when back in office to send referral. CSW to follow.               Expected Discharge Plan: Skilled Nursing Facility Barriers to Discharge: Continued Medical Work up, English as a second language teacher   Patient Goals and CMS Choice Patient states their goals for this hospitalization and ongoing recovery are:: to get rehab CMS Medicare.gov Compare Post Acute Care list provided to:: Patient Choice offered to / list presented to : Patient [place] ownership interest in [facility].provided to:: Patient    Expected Discharge Plan and Services     Post Acute Care Choice: Skilled Nursing Facility Living arrangements for the past 2 months: Single Family Home                                      Prior Living Arrangements/Services Living arrangements for the past 2 months: Single Family Home Lives with:: Spouse Patient language and need for interpreter reviewed:: No Do you feel safe going back to the place where you live?: Yes      Need for Family Participation in Patient Care: No (Comment) Care giver support system in place?: No (comment)   Criminal Activity/Legal Involvement Pertinent to Current Situation/Hospitalization: No - Comment as needed  Activities of Daily Living   ADL Screening (condition at time of admission) Independently performs ADLs?: Yes (appropriate for developmental age) Is the patient deaf or have difficulty hearing?: No Does the patient have difficulty seeing, even when wearing glasses/contacts?: No Does  the patient have difficulty concentrating, remembering, or making decisions?: No  Permission Sought/Granted Permission sought to share information with : Facility Industrial/product designer granted to share information with : Yes, Verbal Permission Granted     Permission granted to share info w AGENCY: SNF        Emotional Assessment Appearance:: Appears stated age Attitude/Demeanor/Rapport: Engaged Affect (typically observed): Appropriate Orientation: : Oriented to Self, Oriented to Place, Oriented to  Time, Oriented to Situation Alcohol / Substance Use: Not Applicable Psych Involvement: No (comment)  Admission diagnosis:  HNP (herniated nucleus pulposus), lumbar [M51.26] Patient Active Problem List   Diagnosis Date Noted   HNP (herniated nucleus pulposus), lumbar 01/23/2023   S/P total knee arthroplasty, right 08/13/2021   S/P total knee arthroplasty, left 02/26/2021   Peripheral arterial disease (HCC) 02/24/2020   Chronic anemia 08/17/2019   Type 4a myocardial infarction (HCC) 08/17/2019   CAD (coronary artery disease) 08/12/2019   Degenerative lumbar spinal stenosis 01/28/2019   Vitamin D deficiency 11/01/2018   Unstable angina (HCC) 06/25/2018   Bilateral lower extremity edema 11/02/2017   CAD S/P percutaneous coronary angioplasty  05/07/2017   Unstable angina pectoris [facility])    Preoperative cardiovascular examination 03/11/2017   Essential hypertension, benign    Mixed hyperlipidemia    GERD (gastroesophageal reflux disease)    Diabetes mellitus without complication (HCC)    Coronary artery disease    Coronary artery disease involving coronary bypass graft of native heart with unstable  angina pectoris (HCC) 10/27/2016   Other fatigue 10/27/2016   DOE (dyspnea on exertion) 10/27/2016   Progressive angina (HCC)    Atherosclerosis of native coronary artery of native heart without angina pectoris 11/29/2012   S/P CABG x 2 11/29/2012   Stable angina (HCC)  11/29/2012   HTN (hypertension) 11/29/2012   Dyslipidemia 11/29/2012   DM type 2 causing vascular disease (HCC) 11/29/2012   PCP:  Theodoro Kos, MD Pharmacy:   [facility] - [place], [place] - [phone] [address] [place] [address] Phone: [phone] Fax: [phone]     Social Determinants of Health (SDOH) Social History: SDOH Screenings   Food Insecurity: No Food Insecurity (01/23/2023)  Housing: Low Risk  (01/23/2023)  Transportation Needs: No Transportation Needs (01/23/2023)  Utilities: Not At Risk (01/23/2023)  Depression (PHQ2-9): Medium Risk (02/07/2019)  Tobacco Use: Low Risk  (01/23/2023)   SDOH Interventions:     Readmission Risk Interventions     No data to display

## 2023-01-25 NOTE — NC FL2 (Signed)
[place] MEDICAID FL2 LEVEL OF CARE FORM     IDENTIFICATION  Patient Name: Tamara Nunez Birthdate: 07/04/1955 Sex: female Admission Date (Current Location): 01/23/2023  [facility] and [place]:   [place]   Facility and Address:  [facility], [address], [place], [place] [address]      Provider Number: [id number]  Attending Physician Name and Address:  Donalee Citrin, MD  Relative Name and Phone Number:       Current Level of Care: Hospital Recommended Level of Care: Skilled Nursing Facility Prior Approval Number:    Date Approved/Denied:   PASRR Number:    Discharge Plan: SNF    Current Diagnoses: Patient Active Problem List   Diagnosis Date Noted   HNP (herniated nucleus pulposus), lumbar 01/23/2023   S/P total knee arthroplasty, right 08/13/2021   S/P total knee arthroplasty, left 02/26/2021   Peripheral arterial disease (HCC) 02/24/2020   Chronic anemia 08/17/2019   Type 4a myocardial infarction (HCC) 08/17/2019   CAD (coronary artery disease) 08/12/2019   Degenerative lumbar spinal stenosis 01/28/2019   Vitamin D deficiency 11/01/2018   Unstable angina (HCC) 06/25/2018   Bilateral lower extremity edema 11/02/2017   CAD S/P percutaneous coronary angioplasty  05/07/2017   Unstable angina pectoris (HCC)    Preoperative cardiovascular examination 03/11/2017   Essential hypertension, benign    Mixed hyperlipidemia    GERD (gastroesophageal reflux disease)    Diabetes mellitus without complication (HCC)    Coronary artery disease    Coronary artery disease involving coronary bypass graft of native heart with unstable angina pectoris (HCC) 10/27/2016   Other fatigue 10/27/2016   DOE (dyspnea on exertion) 10/27/2016   Progressive angina (HCC)    Atherosclerosis of native coronary artery of native heart without angina pectoris 11/29/2012   S/P CABG x 2 11/29/2012   Stable angina (HCC) 11/29/2012   HTN (hypertension) 11/29/2012    Dyslipidemia 11/29/2012   DM type 2 causing vascular disease (HCC) 11/29/2012    Orientation RESPIRATION BLADDER Height & Weight     Self, Time, Situation, Place  Normal Incontinent Weight: 172 lb (78 kg) Height:  5\' 4"  (162.6 cm)  BEHAVIORAL SYMPTOMS/MOOD NEUROLOGICAL BOWEL NUTRITION STATUS      Continent Diet (regular)  AMBULATORY STATUS COMMUNICATION OF NEEDS Skin   Limited Assist Verbally Surgical wounds (closed back, honeycomb dressing)                       Personal Care Assistance Level of Assistance  Bathing, Feeding, Dressing Bathing Assistance: Limited assistance Feeding assistance: Limited assistance Dressing Assistance: Limited assistance     Functional Limitations Info             SPECIAL CARE FACTORS FREQUENCY  PT (By licensed PT), OT (By licensed OT)     PT Frequency: 5x/wk OT Frequency: 5x/wk            Contractures Contractures Info: Not present    Additional Factors Info  Code Status, Allergies, Insulin Sliding Scale Code Status Info: Full Allergies Info: Codeine, Other, Ciprofloxacin, Valsartan, Adhesive (Tape), Sulfa Antibiotics   Insulin Sliding Scale Info: see DC summary       Current Medications (01/25/2023):  This is the current hospital active medication list Current Facility-Administered Medications  Medication Dose Route Frequency Provider Last Rate Last Admin   acetaminophen (TYLENOL) tablet 650 mg  650 mg Oral Q4H PRN Donalee Citrin, MD   650 mg at 01/25/23 [phone]  Or   acetaminophen (TYLENOL) suppository 650 mg  650 mg Rectal Q4H PRN Donalee Citrin, MD       acetaminophen (TYLENOL) tablet 1,000 mg  1,000 mg Oral Q6H Donalee Citrin, MD   1,000 mg at 01/24/23 2350   albuterol (PROVENTIL) (2.5 MG/3ML) 0.083% nebulizer solution 3 mL  3 mL Inhalation Q6H PRN Donalee Citrin, MD       alum & mag hydroxide-simeth (MAALOX/MYLANTA) 200-200-20 MG/5ML suspension 30 mL  30 mL Oral Q6H PRN Donalee Citrin, MD       amLODipine (NORVASC) tablet 2.5 mg  2.5  mg Oral Daily Donalee Citrin, MD       aspirin EC tablet 81 mg  81 mg Oral Daily Donalee Citrin, MD   81 mg at 01/25/23 0913   atorvastatin (LIPITOR) tablet 40 mg  40 mg Oral Daily Donalee Citrin, MD   40 mg at 01/25/23 0913   ceFAZolin (ANCEF) IVPB 2g/100 mL premix  2 g Intravenous Marlena Clipper, MD 200 mL/hr at 01/25/23 0631 2 g at 01/25/23 0631   cholecalciferol (VITAMIN D3) 25 MCG (1000 UNIT) tablet 2,000 Units  2,000 Units Oral Q breakfast Donalee Citrin, MD   2,000 Units at 01/25/23 0913   cyclobenzaprine (FLEXERIL) tablet 10 mg  10 mg Oral TID PRN Donalee Citrin, MD       dicyclomine (BENTYL) capsule 10 mg  10 mg Oral Q6H PRN Donalee Citrin, MD       docusate sodium (COLACE) capsule 100 mg  100 mg Oral Daily Donalee Citrin, MD   100 mg at 01/25/23 0913   EPINEPHrine (EPI-PEN) injection 0.3 mg  0.3 mg Intramuscular PRN Donalee Citrin, MD       Fe Fum-Vit C-Vit B12-FA (TRIGELS-F FORTE) capsule 1 capsule  1 capsule Oral Daily Donalee Citrin, MD   1 capsule at 01/25/23 0927   fluticasone (FLONASE) 50 MCG/ACT nasal spray 1 spray  1 spray Each Nare BID Donalee Citrin, MD   1 spray at 01/25/23 0915   furosemide (LASIX) tablet 40 mg  40 mg Oral Daily PRN Donalee Citrin, MD       gabapentin (NEURONTIN) capsule 600 mg  600 mg Oral TID Donalee Citrin, MD   600 mg at 01/25/23 0913   HYDROmorphone (DILAUDID) injection 0.5 mg  0.5 mg Intravenous Q2H PRN Donalee Citrin, MD   0.5 mg at 01/24/23 0111   insulin aspart (novoLOG) injection 0-15 Units  0-15 Units Subcutaneous TID WC Donalee Citrin, MD       isosorbide mononitrate (IMDUR) 24 hr tablet 60 mg  60 mg Oral Daily Donalee Citrin, MD       lidocaine (LIDODERM) 5 % 2 patch  2 patch Transdermal Daily PRN Donalee Citrin, MD       loratadine (CLARITIN) tablet 10 mg  10 mg Oral Daily Donalee Citrin, MD   10 mg at 01/25/23 0912   menthol-cetylpyridinium (CEPACOL) lozenge 3 mg  1 lozenge Oral PRN Donalee Citrin, MD       Or   phenol (CHLORASEPTIC) mouth spray 1 spray  1 spray Mouth/Throat PRN Donalee Citrin, MD       metoprolol  tartrate (LOPRESSOR) tablet 50 mg  50 mg Oral BID Donalee Citrin, MD   50 mg at 01/23/23 2200   multivitamin with minerals tablet 1 tablet  1 tablet Oral Daily Donalee Citrin, MD   1 tablet at 01/25/23 0912   nitroGLYCERIN (NITROSTAT) SL tablet 0.4 mg  0.4 mg Sublingual Q5 min PRN Wynetta Emery,  Jillyn Hidden, MD       ondansetron [facility]) tablet 4 mg  4 mg Oral Q6H PRN Donalee Citrin, MD       Or   ondansetron [facility]) injection 4 mg  4 mg Intravenous Q6H PRN Donalee Citrin, MD       oxyCODONE (Oxy IR/ROXICODONE) immediate release tablet 10 mg  10 mg Oral Q3H PRN Donalee Citrin, MD   10 mg at 01/25/23 1023   pantoprazole (PROTONIX) EC tablet 40 mg  40 mg Oral BID PRN Donalee Citrin, MD       polyethylene glycol (MIRALAX / GLYCOLAX) packet 17 g  17 g Oral Daily PRN Donalee Citrin, MD       ranolazine (RANEXA) 12 hr tablet 1,000 mg  1,000 mg Oral BID Donalee Citrin, MD   1,000 mg at 01/25/23 1025   sodium chloride flush (NS) 0.9 % injection 3 mL  3 mL Intravenous Q12H Donalee Citrin, MD   3 mL at 01/24/23 2145   sodium chloride flush (NS) 0.9 % injection 3 mL  3 mL Intravenous Q12H Donalee Citrin, MD   3 mL at 01/24/23 2145   sodium chloride flush (NS) 0.9 % injection 3 mL  3 mL Intravenous PRN Donalee Citrin, MD       traMADol Janean Sark) tablet 50 mg  50 mg Oral Q6H Donalee Citrin, MD   50 mg at 01/25/23 1610   vitamin E capsule 400 Units  400 Units Oral Daily Donalee Citrin, MD   400 Units at 01/25/23 9604     Discharge Medications: Please see discharge summary for a list of discharge medications.  Relevant Imaging Results:  Relevant Lab Results:   Additional Information SS#: [id number]  Baldemar Lenis, LCSW

## 2023-01-25 NOTE — Progress Notes (Signed)
Postop day 2.  Patient with significant pain.  Has been symptomatically hypotensive overnight.  Awake and alert.  Oriented and appropriate.  Speech fluent.  Motor and sensory function intact.  Drain output remains high.  Dressing clean and dry.  Status post multilevel thoracolumbar decompression and fusion.  Continue supportive efforts.  Recheck CBC tomorrow.  Patient may have symptomatic acute blood loss anemia and may benefit from transfusion of hemoglobin continues to decrease.

## 2023-01-25 NOTE — Progress Notes (Signed)
Physical Therapy Treatment Patient Details Name: Tamara Nunez MRN: [id number] DOB: Nov 23, 1955 Today's Date: 01/25/2023   History of Present Illness 67 yo female with c/o bilateral hip and leg pain, she has longstanding issues with her back from previous L2-S1 Fusion and presents with  segmental degeneration above her fusion with severe spinal stenosis with discrimination L1-L2 and marked degenerative disc disease with Modic changes and instability at T11-12. She is now s/p T11-L2 extension of fusion and PLIF L1-L2 on 01/23/23. PMHx of Arthritis, CAD, Fibromyalgia, GERD, HLD, HTN, DM II, L TKA, R TKA.    PT Comments  Pt in recliner upon arrival with family present and agreeable to PT session. Worked on gait training, LE exercises, and transfers in today's session. Pt has increased pain when performing L LE exercises compared to R LE. Pt improved in ability to walk further today, ~30 ft, with CGA and RW. Pt continues to need ModA for bed mobility to adhere to spinal precautions. Pt was able to recall spinal precautions and follow throughout session. Pt is progressing well towards goals. Acute PT to follow.      If plan is discharge home, recommend the following: A little help with walking and/or transfers;A little help with bathing/dressing/bathroom;Assistance with cooking/housework;Assist for transportation;Help with stairs or ramp for entrance   Can travel by private vehicle     Yes  Equipment Recommendations  Rolling walker (2 wheels);BSC/3in1       Precautions / Restrictions Precautions Precautions: Back;Fall Precaution Booklet Issued: Yes (comment) Precaution Comments: hemovac Restrictions Weight Bearing Restrictions: No     Mobility  Bed Mobility Overal bed mobility: Needs Assistance Bed Mobility: Sit to Sidelying    Sit to sidelying: Mod assist General bed mobility comments: ModA to bring B LE back into bed    Transfers Overall transfer level: Needs  assistance Equipment used: Rolling walker (2 wheels) Transfers: Sit to/from Stand (x2) Sit to Stand: Min assist    General transfer comment: MinA for boost up, slow and effortful    Ambulation/Gait Ambulation/Gait assistance: Contact guard assist Gait Distance (Feet): 30 Feet Assistive device: Rolling walker (2 wheels) Gait Pattern/deviations: Step-through pattern, Decreased dorsiflexion - right, Decreased dorsiflexion - left, Decreased stride length Gait velocity: decreased     General Gait Details: slow and steady, increased effort         Balance Overall balance assessment: Needs assistance Sitting-balance support: Feet supported, No upper extremity supported Sitting balance-Leahy Scale: Fair     Standing balance support: Reliant on assistive device for balance, During functional activity, Bilateral upper extremity supported Standing balance-Leahy Scale: Poor Standing balance comment: reliant on RW       Cognition Arousal: Alert Behavior During Therapy: WFL for tasks assessed/performed Overall Cognitive Status: Within Functional Limits for tasks assessed     Exercises General Exercises - Lower Extremity Long Arc Quad: AROM, Both, 10 reps, Seated Hip Flexion/Marching: AROM, Both, 10 reps, Seated    General Comments General comments (skin integrity, edema, etc.): VSS on RA      Pertinent Vitals/Pain Pain Assessment Pain Assessment: Faces Faces Pain Scale: Hurts whole lot Pain Location: back, incision site Pain Descriptors / Indicators: Aching, Sore, Sharp, Discomfort, Guarding Pain Intervention(s): Limited activity within patient's tolerance, Monitored during session, Repositioned     PT Goals (current goals can now be found in the care plan section) Acute Rehab PT Goals Patient Stated Goal: less pain PT Goal Formulation: With patient Time For Goal Achievement: 02/07/23 Potential to Achieve Goals: Good Progress  towards PT goals: Progressing toward  goals    Frequency    Min 5X/week       AM-PAC PT "6 Clicks" Mobility   Outcome Measure  Help needed turning from your back to your side while in a flat bed without using bedrails?: A Little Help needed moving from lying on your back to sitting on the side of a flat bed without using bedrails?: A Lot Help needed moving to and from a bed to a chair (including a wheelchair)?: A Little Help needed standing up from a chair using your arms (e.g., wheelchair or bedside chair)?: A Little Help needed to walk in hospital room?: A Little Help needed climbing 3-5 steps with a railing? : A Lot 6 Click Score: 16    End of Session   Activity Tolerance: Patient tolerated treatment well Patient left: in bed;with call bell/phone within reach;with family/visitor present Nurse Communication: Mobility status PT Visit Diagnosis: Difficulty in walking, not elsewhere classified (R26.2) Pain - part of body:  (back)     Time: 1610-9604 PT Time Calculation (min) (ACUTE ONLY): 30 min  Charges:    $Gait Training: 8-22 mins $Therapeutic Exercise: 8-22 mins PT General Charges $$ ACUTE PT VISIT: 1 Visit                     Hilton Cork, PT, DPT Secure Chat Preferred  Rehab Office [phone]    Arturo Morton Brion Aliment 01/25/2023, 2:45 PM

## 2023-01-25 NOTE — Progress Notes (Signed)
Hypoglycemic Event  CBG: 64  Treatment: 4 oz juice/soda  Symptoms: None  Follow-up CBG: Time:1718 CBG Result:102  Possible Reasons for Event: Inadequate meal intake     Tamara Nunez

## 2023-01-25 NOTE — Progress Notes (Signed)
   01/25/23 0449  Assess: MEWS Score  Temp 98.4 F (36.9 C)  BP (!) 98/56  MAP (mmHg) 69  Pulse Rate (!) 108  Resp 16  Level of Consciousness Alert  SpO2 92 %  O2 Device Room Air  Assess: MEWS Score  MEWS Temp 0  MEWS Systolic 1  MEWS Pulse 1  MEWS RR 0  MEWS LOC 0  MEWS Score 2  MEWS Score Color Yellow  Assess: if the MEWS score is Yellow or Red  Were vital signs accurate and taken at a resting state? Yes  Does the patient meet 2 or more of the SIRS criteria? No  MEWS guidelines implemented  Yes, yellow  Treat  MEWS Interventions Considered administering scheduled or prn medications/treatments as ordered  Take Vital Signs  Increase Vital Sign Frequency  Yellow: Q2hr x1, continue Q4hrs until patient remains green for 12hrs  Escalate  MEWS: Escalate Yellow: Discuss with charge nurse and consider notifying provider and/or RRT  Notify: Charge Nurse/RN  Name of Charge Nurse/RN Notified Ida, RN  Provider Notification  Provider Name/Title Hoyt Koch, MD  Date Provider Notified 01/25/23  Time Provider Notified [phone]  Method of Notification Page;Call  Notification Reason Other (Comment) (yellow MEWS)  Provider response See new orders  Date of Provider Response 01/25/23  Time of Provider Response 0650  Assess: SIRS CRITERIA  SIRS Temperature  0  SIRS Pulse 1  SIRS Respirations  0  SIRS WBC 0  SIRS Score Sum  1

## 2023-01-25 NOTE — Plan of Care (Signed)
  Problem: Education: Goal: Knowledge of General Education information will improve Description: Including pain rating scale, medication(s)/side effects and non-pharmacologic comfort measures Outcome: Progressing   Problem: Pain Management: Goal: General experience of comfort will improve Outcome: Progressing   Problem: Coping: Goal: Ability to adjust to condition or change in health will improve Outcome: Progressing

## 2023-01-26 LAB — GLUCOSE, CAPILLARY
Glucose-Capillary: 83 mg/dL (ref 70–99)
Glucose-Capillary: 120 mg/dL — ABNORMAL HIGH (ref 70–99)
Glucose-Capillary: 59 mg/dL — ABNORMAL LOW (ref 70–99)
Glucose-Capillary: 147 mg/dL — ABNORMAL HIGH (ref 70–99)
Glucose-Capillary: 160 mg/dL — ABNORMAL HIGH (ref 70–99)

## 2023-01-26 LAB — CBC
HCT: 25.6 % — ABNORMAL LOW (ref 36.0–46.0)
Hemoglobin: 8.3 g/dL — ABNORMAL LOW (ref 12.0–15.0)
MCH: 29.6 pg (ref 26.0–34.0)
MCHC: 32.4 g/dL (ref 30.0–36.0)
MCV: 91.4 fL (ref 80.0–100.0)
Platelets: 250 10*3/uL (ref 150–400)
RBC: 2.8 MIL/uL — ABNORMAL LOW (ref 3.87–5.11)
RDW: 14.6 % (ref 11.5–15.5)
WBC: 9.1 10*3/uL (ref 4.0–10.5)
nRBC: 0 % (ref 0.0–0.2)

## 2023-01-26 NOTE — Plan of Care (Signed)
  Problem: Activity: Goal: Risk for activity intolerance will decrease Outcome: Progressing   Problem: Nutrition: Goal: Adequate nutrition will be maintained Outcome: Progressing   Problem: Pain Management: Goal: General experience of comfort will improve Outcome: Progressing   Problem: Safety: Goal: Ability to remain free from injury will improve Outcome: Progressing

## 2023-01-26 NOTE — TOC Progression Note (Addendum)
Transition of Care [facility]) - Progression Note    Patient Details  Name: Tamara Nunez MRN: [id number] Date of Birth: 1955/11/13  Transition of Care [facility]) CM/SW Contact  Lorri Frederick, LCSW Phone Number: 01/26/2023, 8:25 AM  Clinical Narrative:   Hope Pigeon; faxed to [facility], CSW reached out to [facility] to review.   1100: [facility] offer a bed, CSW spoke with pt and she does want to accept this offer.  SNF auth request submitted in [place] and approved: [id number], 7 days: 11/19-11/25.   [facility] confirms she can receive pt tomorrow.     CSW spoke with pt regarding transportation.  Her husband will be back this evening and he spends the night here--he can provide transport tomorrow.    Expected Discharge Plan: Skilled Nursing Facility Barriers to Discharge: Continued Medical Work up, English as a second language teacher  Expected Discharge Plan and Services     Post Acute Care Choice: Skilled Nursing Facility Living arrangements for the past 2 months: Single Family Home                                       Social Determinants of Health (SDOH) Interventions SDOH Screenings   Food Insecurity: No Food Insecurity (01/23/2023)  Housing: Low Risk  (01/23/2023)  Transportation Needs: No Transportation Needs (01/23/2023)  Utilities: Not At Risk (01/23/2023)  Depression (PHQ2-9): Medium Risk (02/07/2019)  Tobacco Use: Low Risk  (01/23/2023)    Readmission Risk Interventions     No data to display

## 2023-01-26 NOTE — Progress Notes (Signed)
Mobility Specialist: Progress Note   01/26/23 1547  Mobility  Activity Ambulated with assistance in room  Level of Assistance Contact guard assist, steadying assist  Assistive Device Front wheel walker  Distance Ambulated (ft) 15 ft  Activity Response Tolerated well  Mobility Referral Yes  $Mobility charge 1 Mobility  Mobility Specialist Start Time (ACUTE ONLY) 1515  Mobility Specialist Stop Time (ACUTE ONLY) 1535  Mobility Specialist Time Calculation (min) (ACUTE ONLY) 20 min    Pt was agreeable to mobility session - received in chair. Had c/o back pain rated 8/10. ModA for STS, CG for ambulation. Slow but steady gait and good posture even though it's more painful for her. Left in chair with all needs met, call bell in reach. Performed BLE exercises in chair - 5x ankle pumps each foot, 5x knee kicks each leg.   Maurene Capes Mobility Specialist Please contact via SecureChat or Rehab office at [phone]

## 2023-01-26 NOTE — Progress Notes (Signed)
Physical Therapy Treatment Patient Details Name: Tamara Nunez MRN: [id number] DOB: June 25, 1955 Today's Date: 01/26/2023   History of Present Illness 67 yo female with c/o bilateral hip and leg pain, she has longstanding issues with her back from previous L2-S1 Fusion and presents with  segmental degeneration above her fusion with severe spinal stenosis with discrimination L1-L2 and marked degenerative disc disease with Modic changes and instability at T11-12. She is now s/p T11-L2 extension of fusion and PLIF L1-L2 on 01/23/23. PMHx of Arthritis, CAD, Fibromyalgia, GERD, HLD, HTN, DM II, L TKA, R TKA.    PT Comments  Pt received in chair and needing to use BSC. Needed mod A for first stand but then lighter mod for subsequent stands. Pt continues to c/o numbness LLE as well as feeling of heaviness in that extremity and slides LLE on floor due to inability to fully swing through. Pt also needs more assist lifting it against gravity back into bed. Pt needed cues for breathing throughout session due to pain. Performed pelvic tilts and chin tucks in supine end of session for gentle stretching and helping posture. Husband present for session. PT will continue to follow.     If plan is discharge home, recommend the following: A little help with walking and/or transfers;A little help with bathing/dressing/bathroom;Assistance with cooking/housework;Assist for transportation;Help with stairs or ramp for entrance   Can travel by private vehicle     Yes  Equipment Recommendations  Rolling walker (2 wheels);BSC/3in1    Recommendations for Other Services       Precautions / Restrictions Precautions Precautions: Back;Fall Precaution Booklet Issued: No Precaution Comments: hemovac removed by MD during PT session. Pt able to verbalize 3/3 precautions, but needed vc's to prevent twisting with bed mobility Restrictions Weight Bearing Restrictions: No     Mobility  Bed Mobility Overal bed mobility:  Needs Assistance Bed Mobility: Sit to Sidelying         Sit to sidelying: Mod assist General bed mobility comments: ModA to bring B LE back into bed, vc's for keeping both arms in front of her. Increased time needed to accept pressure onto back    Transfers Overall transfer level: Needs assistance Equipment used: Rolling walker (2 wheels) Transfers: Sit to/from Stand, Bed to chair/wheelchair/BSC Sit to Stand: Min assist   Step pivot transfers: Min assist       General transfer comment: MinA for boost up, slow and effortful, pivot steps taken chair to [facility]. Pt needed less assist standing from [facility]    Ambulation/Gait Ambulation/Gait assistance: Contact guard assist Gait Distance (Feet): 30 Feet Assistive device: Rolling walker (2 wheels) Gait Pattern/deviations: Step-through pattern, Decreased dorsiflexion - left, Decreased stride length Gait velocity: decreased Gait velocity interpretation: <1.31 ft/sec, indicative of household ambulator   General Gait Details: pt sliding LLE on floor and reports numbness and feeling of heaviness LLE. Pt had bariatric RW in room, obtained a regular one to make it lighter for her to push.   Stairs             Wheelchair Mobility     Tilt Bed    Modified Rankin (Stroke Patients Only)       Balance Overall balance assessment: Needs assistance Sitting-balance support: Feet supported, No upper extremity supported Sitting balance-Leahy Scale: Fair Sitting balance - Comments: EOB sitting   Standing balance support: Reliant on assistive device for balance, During functional activity, Bilateral upper extremity supported Standing balance-Leahy Scale: Poor Standing balance comment: reliant on RW  Cognition Arousal: Alert Behavior During Therapy: WFL for tasks assessed/performed Overall Cognitive Status: Within Functional Limits for tasks assessed                                           Exercises Other Exercises Other Exercises: pelvic tilt x5 Other Exercises: chin tucks x10    General Comments General comments (skin integrity, edema, etc.): VSS on RA, husband present      Pertinent Vitals/Pain Pain Assessment Pain Assessment: Faces Faces Pain Scale: Hurts whole lot Pain Location: back, incision site Pain Descriptors / Indicators: Aching, Sore, Sharp, Discomfort, Guarding (LLE numbness) Pain Intervention(s): Limited activity within patient's tolerance, Monitored during session, Patient requesting pain meds-RN notified, RN gave pain meds during session    Home Living                          Prior Function            PT Goals (current goals can now be found in the care plan section) Acute Rehab PT Goals Patient Stated Goal: less pain PT Goal Formulation: With patient Time For Goal Achievement: 02/07/23 Potential to Achieve Goals: Good Progress towards PT goals: Progressing toward goals    Frequency    Min 5X/week      PT Plan      Co-evaluation              AM-PAC PT "6 Clicks" Mobility   Outcome Measure  Help needed turning from your back to your side while in a flat bed without using bedrails?: A Little Help needed moving from lying on your back to sitting on the side of a flat bed without using bedrails?: A Lot Help needed moving to and from a bed to a chair (including a wheelchair)?: A Little Help needed standing up from a chair using your arms (e.g., wheelchair or bedside chair)?: A Little Help needed to walk in hospital room?: A Little Help needed climbing 3-5 steps with a railing? : A Lot 6 Click Score: 16    End of Session Equipment Utilized During Treatment:  (pt reports she cannot tolerate gait belt with her incision) Activity Tolerance: Patient tolerated treatment well Patient left: in bed;with call bell/phone within reach;with family/visitor present Nurse Communication: Mobility status PT Visit  Diagnosis: Difficulty in walking, not elsewhere classified (R26.2) Pain - part of body:  (back)     Time: 3474-2595 PT Time Calculation (min) (ACUTE ONLY): 27 min  Charges:    $Gait Training: 8-22 mins $Therapeutic Activity: 8-22 mins PT General Charges $$ ACUTE PT VISIT: 1 Visit                     Lyanne Co, PT  Acute Rehab Services Secure chat preferred Office [phone]    Lawana Chambers Katheren Jimmerson 01/26/2023, 11:20 AM

## 2023-01-26 NOTE — Progress Notes (Signed)
Attempted to contact provider service was available, blood glucose was 59 hypoglycemic protocol (juice& crackers) given, recheck of CBG done after hypoglycemic protocol blood glucose is at 147

## 2023-01-26 NOTE — Care Management Important Message (Signed)
Important Message  Patient Details  Name: Tamara Nunez MRN: [id number] Date of Birth: 09-28-1955   Important Message Given:  Yes - Medicare IM     Sherilyn Banker 01/26/2023, 2:22 PM

## 2023-01-26 NOTE — Progress Notes (Signed)
3 Days Post-Op   Subjective/Chief Complaint: Condition of back pain and numbness left leg but improved left lower extremity strength   Objective: Vital signs in last 24 hours: Temp:  [98 F (36.7 C)-98.4 F (36.9 C)] 98.4 F (36.9 C) (11/18 0728) Pulse Rate:  [84-101] 84 (11/18 0728) Resp:  [14-18] 14 (11/18 0555) BP: (100-116)/(38-50) 112/50 (11/18 0728) SpO2:  [92 %-99 %] 99 % (11/18 0728) Last BM Date : 01/21/23  Intake/Output from previous day: 11/17 0701 - 11/18 0700 In: -  Out: 500 [Urine:400; Drains:100] Intake/Output this shift: No intake/output data recorded.  Strength improved to 4+ out of 5 incision clean dry and intact  Lab Results:  Recent Labs    01/25/23 0918 01/26/23 0812  WBC 8.6 9.1  HGB 7.9* 8.3*  HCT 24.5* 25.6*  PLT 215 250   BMET No results for input(s): "NA", "K", "CL", "CO2", "GLUCOSE", "BUN", "CREATININE", "CALCIUM" in the last 72 hours. PT/INR No results for input(s): "LABPROT", "INR" in the last 72 hours. ABG No results for input(s): "PHART", "HCO3" in the last 72 hours.  Invalid input(s): "PCO2", "PO2"  Studies/Results: No results found.  Anti-infectives: Anti-infectives (From admission, onward)    Start     Dose/Rate Route Frequency Ordered Stop   01/23/23 2200  ceFAZolin (ANCEF) IVPB 2g/100 mL premix        2 g 200 mL/hr over 30 Minutes Intravenous Every 8 hours 01/23/23 2007 01/25/23 1307   01/23/23 1115  ceFAZolin (ANCEF) IVPB 2g/100 mL premix        2 g 200 mL/hr over 30 Minutes Intravenous On call to O.R. 01/23/23 0956 01/23/23 1448   01/23/23 1001  ceFAZolin (ANCEF) 2-4 GM/100ML-% IVPB       Note to Pharmacy: Lacie Draft A: cabinet override      01/23/23 1001 01/23/23 1431       Assessment/Plan: s/p Procedure(s): Thoracic eleven-Thoracic twelve - Thoracic twelve-Lumbar one - Lumbar one-Lumbar two extension of fusion, Lumbar two-three pedicle screws and rods and Posterior Lumbar Interbody Fusion at Lumbar  one-Lumbar two - Posterior Lateral and Interbody fusion (N/A) Continue to mobilize with physical and Occupational Therapy consider rehab placement took out her drain making good progress  LOS: 3 days    Tamara Nunez 01/26/2023

## 2023-01-27 LAB — GLUCOSE, CAPILLARY: Glucose-Capillary: 95 mg/dL (ref 70–99)

## 2023-01-27 MED ORDER — METHOCARBAMOL 500 MG PO TABS: 500.0000 mg | ORAL_TABLET | Freq: Three times a day (TID) | ORAL | 0 refills | Status: AC | PRN

## 2023-01-27 MED ORDER — OXYCODONE-ACETAMINOPHEN 5-325 MG PO TABS: 1.0000 | ORAL_TABLET | Freq: Four times a day (QID) | ORAL | 0 refills | Status: AC | PRN

## 2023-01-27 NOTE — TOC Progression Note (Addendum)
Transition of Care [facility]) - Progression Note    Patient Details  Name: Tamara Nunez MRN: [id number] Date of Birth: August 26, 1955  [facility] CM/SW Contact  Lorri Frederick, LCSW Phone Number: 01/27/2023, 9:48 AM  Clinical Narrative:   CSW confirmed with Colin Rhein they can receive pt today.  CSW called MD office and requested DC summary.  1220: DC summary faxed to [employer].  CSW spoke with pt and husband in room.  Husband asking about ambulance transport, discussed that this can be requested, possible they will be billed for this.  They do not want to use ambulance.  Discussed will have assistance with getting into and out of vehicle.  Pt also asking questions about back brace, PT assists with answering those questions.    Expected Discharge Plan: Skilled Nursing Facility Barriers to Discharge: Continued Medical Work up, English as a second language teacher  Expected Discharge Plan and Services     Post Acute Care Choice: Skilled Nursing Facility Living arrangements for the past 2 months: Single Family Home                                       Social Determinants of Health (SDOH) Interventions SDOH Screenings   Food Insecurity: No Food Insecurity (01/23/2023)  Housing: Low Risk  (01/23/2023)  Transportation Needs: No Transportation Needs (01/23/2023)  Utilities: Not At Risk (01/23/2023)  Depression (PHQ2-9): Medium Risk (02/07/2019)  Tobacco Use: Low Risk  (01/23/2023)    Readmission Risk Interventions     No data to display

## 2023-01-27 NOTE — Discharge Summary (Signed)
Physician Discharge Summary  Patient ID: Tamara Nunez MRN: [id number] DOB/AGE: 68-Nov-1957 68 y.o.  Admit date: 01/23/2023 Discharge date: 01/27/2023  Admission Diagnoses: Lumbar spinal stenosis L1-L2 and herniated nucleus pulposus L1-L2 with instability degenerative disc disease T11-T12 with instability. With bilateral left greater than right L1-L2 radiculopathies and claudication     Discharge Diagnoses: same   Discharged Condition: good  Hospital Course: The patient was admitted on 01/23/2023 and taken to the operating room where the patient underwent posterior lateral arthrodesis T10-L2, with decompression at L1-2. The patient tolerated the procedure well and was taken to the recovery room and then to the floor in stable condition. The hospital course was routine. There were no complications. The wound remained clean dry and intact. Pt had appropriate back soreness. No complaints of leg pain or new N/T/W. The patient remained afebrile with stable vital signs, and tolerated a regular diet. The patient continued to increase activities, and pain was well controlled with oral pain medications.   Consults: None  Significant Diagnostic Studies:  Results for orders placed or performed during the hospital encounter of 01/23/23  Glucose, capillary  Result Value Ref Range   Glucose-Capillary 78 70 - 99 mg/dL  Glucose, capillary  Result Value Ref Range   Glucose-Capillary 72 70 - 99 mg/dL  Glucose, capillary  Result Value Ref Range   Glucose-Capillary 41 (LL) 70 - 99 mg/dL   Comment 1 Notify RN   Glucose, capillary  Result Value Ref Range   Glucose-Capillary 105 (H) 70 - 99 mg/dL  Glucose, capillary  Result Value Ref Range   Glucose-Capillary 94 70 - 99 mg/dL  Glucose, capillary  Result Value Ref Range   Glucose-Capillary 112 (H) 70 - 99 mg/dL  Glucose, capillary  Result Value Ref Range   Glucose-Capillary 131 (H) 70 - 99 mg/dL  Glucose, capillary  Result Value Ref Range    Glucose-Capillary 106 (H) 70 - 99 mg/dL  Glucose, capillary  Result Value Ref Range   Glucose-Capillary 92 70 - 99 mg/dL  Glucose, capillary  Result Value Ref Range   Glucose-Capillary 107 (H) 70 - 99 mg/dL  Glucose, capillary  Result Value Ref Range   Glucose-Capillary 127 (H) 70 - 99 mg/dL  CBC  Result Value Ref Range   WBC 8.6 4.0 - 10.5 K/uL   RBC 2.64 (L) 3.87 - 5.11 MIL/uL   Hemoglobin 7.9 (L) 12.0 - 15.0 g/dL   HCT 86.5 (L) 78.4 - 69.6 %   MCV 92.8 80.0 - 100.0 fL   MCH 29.9 26.0 - 34.0 pg   MCHC 32.2 30.0 - 36.0 g/dL   RDW 29.5 28.4 - 13.2 %   Platelets 215 150 - 400 K/uL   nRBC 0.0 0.0 - 0.2 %  Glucose, capillary  Result Value Ref Range   Glucose-Capillary 81 70 - 99 mg/dL  Glucose, capillary  Result Value Ref Range   Glucose-Capillary 89 70 - 99 mg/dL  Glucose, capillary  Result Value Ref Range   Glucose-Capillary 66 (L) 70 - 99 mg/dL  CBC  Result Value Ref Range   WBC 9.1 4.0 - 10.5 K/uL   RBC 2.80 (L) 3.87 - 5.11 MIL/uL   Hemoglobin 8.3 (L) 12.0 - 15.0 g/dL   HCT 44.0 (L) 10.2 - 72.5 %   MCV 91.4 80.0 - 100.0 fL   MCH 29.6 26.0 - 34.0 pg   MCHC 32.4 30.0 - 36.0 g/dL   RDW 36.6 44.0 - 34.7 %   Platelets 250 150 -  400 K/uL   nRBC 0.0 0.0 - 0.2 %  Glucose, capillary  Result Value Ref Range   Glucose-Capillary 102 (H) 70 - 99 mg/dL  Glucose, capillary  Result Value Ref Range   Glucose-Capillary 163 (H) 70 - 99 mg/dL  Glucose, capillary  Result Value Ref Range   Glucose-Capillary 83 70 - 99 mg/dL  Glucose, capillary  Result Value Ref Range   Glucose-Capillary 120 (H) 70 - 99 mg/dL  Glucose, capillary  Result Value Ref Range   Glucose-Capillary 59 (L) 70 - 99 mg/dL  Glucose, capillary  Result Value Ref Range   Glucose-Capillary 147 (H) 70 - 99 mg/dL  Glucose, capillary  Result Value Ref Range   Glucose-Capillary 160 (H) 70 - 99 mg/dL  Type and screen [facility]  Result Value Ref Range   ABO/RH(D) O POS    Antibody Screen NEG     Sample Expiration      01/26/2023,2359 Performed at [facility], [address], [place], [place] [address]     DG THORACOLUMBAR SPINE  Result Date: 01/23/2023 CLINICAL DATA:  Fusion EXAM: THORACOLUMBAR SPINE 1V COMPARISON:  MRI 12/27/2022 FINDINGS: Postoperative changes from posterior fusion from T10-L5 (proximal extension from prior L2-5 fusion). No hardware or bony complicating feature. IMPRESSION: Posterior fusion changes.  No visible complicating feature. Electronically Signed   By: Charlett Nose M.D.   On: 01/23/2023 22:05   DG THORACOLUMBAR SPINE  Result Date: 01/23/2023 CLINICAL DATA:  Elective surgery. EXAM: THORACOLUMBAR SPINE 1V COMPARISON:  None Available. FINDINGS: Two fluoroscopic spot views of the thoracolumbar spine obtained in the operating room. Multilevel pedicle screws in place. Levels difficult to delineate due to coned view. Fluoroscopy time 83.4 seconds. Dose 33.1 mGy IMPRESSION: Intraoperative fluoroscopy during thoracolumbar spine surgery. Electronically Signed   By: Narda Rutherford M.D.   On: 01/23/2023 17:44   DG C-Arm 1-60 Min-No Report  Result Date: 01/23/2023 Fluoroscopy was utilized by the requesting physician.  No radiographic interpretation.   DG C-Arm 1-60 Min-No Report  Result Date: 01/23/2023 Fluoroscopy was utilized by the requesting physician.  No radiographic interpretation.    Antibiotics:  Anti-infectives (From admission, onward)    Start     Dose/Rate Route Frequency Ordered Stop   01/23/23 2200  ceFAZolin (ANCEF) IVPB 2g/100 mL premix        2 g 200 mL/hr over 30 Minutes Intravenous Every 8 hours 01/23/23 2007 01/25/23 1307   01/23/23 1115  ceFAZolin (ANCEF) IVPB 2g/100 mL premix        2 g 200 mL/hr over 30 Minutes Intravenous On call to O.R. 01/23/23 0956 01/23/23 1448   01/23/23 1001  ceFAZolin (ANCEF) 2-4 GM/100ML-% IVPB       Note to Pharmacy: Lacie Draft A: cabinet override      01/23/23 1001 01/23/23 1431        Discharge Exam: Blood pressure (!) 124/47, pulse 81, temperature 97.9 F (36.6 C), temperature source Oral, resp. rate 18, height 5\' 4"  (1.626 m), weight 78 kg, SpO2 96%. Neurologic: Grossly normal Ambulating and voiding well incision   Discharge Medications:   Allergies as of 01/27/2023       Reactions   Codeine Hives, Shortness Of Breath      Other Anaphylaxis   Nuts   Ciprofloxacin Itching   Valsartan Rash   Adhesive [tape] Itching   "clear tape" used after surgery."took all her skin off".  Paper take is ok   Sulfa Antibiotics Nausea Only  Medication List     STOP taking these medications    traMADol 50 MG tablet Commonly known as: ULTRAM       TAKE these medications    Accu-Chek Guide Me w/Device Kit Use to test BG qid   Accu-Chek Guide test strip Generic drug: glucose blood USE TO CHECK BLOOD GLUCOSE DAILY AS DIRECTED   Accu-Chek Guide test strip Generic drug: glucose blood for testing as directed Dx E11.65   Accu-Chek Guide test strip Generic drug: glucose blood 1 each by Other route in the morning and at bedtime. Test BG bid. Dx E11.65   acetaminophen 500 MG tablet Commonly known as: TYLENOL Take 2 tablets (1,000 mg total) by mouth every 6 (six) hours.   albuterol 108 (90 Base) MCG/ACT inhaler Commonly known as: VENTOLIN HFA Inhale 1 puff into the lungs every 6 (six) hours as needed for wheezing.   amLODipine 2.5 MG tablet Commonly known as: NORVASC Take 1 tablet (2.5 mg total) by mouth daily.   aspirin EC 81 MG tablet Take 81 mg by mouth daily. Swallow whole.   atorvastatin 40 MG tablet Commonly known as: LIPITOR Take 1 tablet (40 mg total) by mouth daily.   Brilinta 60 MG Tabs tablet Generic drug: ticagrelor TAKE 1 TABLET BY MOUTH TWICE  DAILY   cetirizine 10 MG tablet Commonly known as: ZYRTEC Take 10 mg by mouth daily.   Cholecalciferol 50 MCG (2000 UT) Caps Take 1 capsule (2,000 Units total) by mouth daily with  breakfast.   Co Q-10 200 MG Caps Take 200 mg by mouth daily.   cyclobenzaprine 10 MG tablet Commonly known as: FLEXERIL Take 1 tablet (10 mg total) by mouth 3 (three) times daily as needed for muscle spasms.   diclofenac sodium 1 % Gel Commonly known as: VOLTAREN Apply 1 application. topically in the morning and at bedtime.   dicyclomine 10 MG capsule Commonly known as: BENTYL Take 10 mg by mouth every 6 (six) hours as needed for spasms.   EPINEPHrine 0.3 mg/0.3 mL Soaj injection Commonly known as: EPI-PEN Inject 0.3 mg into the muscle as needed for anaphylaxis.   Fingerstix Lancets Misc Inject into the skin.   Accu-Chek Softclix Lancets lancets USE TO TEST BLOOD GLUCOSE TWICE  DAILY   fluticasone 50 MCG/ACT nasal spray Commonly known as: FLONASE Place 1 spray into both nostrils 2 (two) times daily.   furosemide 40 MG tablet Commonly known as: LASIX Take 40 mg by mouth daily as needed for fluid or edema.   gabapentin 600 MG tablet Commonly known as: NEURONTIN Take 600 mg by mouth in the morning, at noon, in the evening, and at bedtime.   isosorbide mononitrate 60 MG 24 hr tablet Commonly known as: IMDUR Take 1 tablet (60 mg total) by mouth daily.   lidocaine 5 % Commonly known as: Lidoderm Place 1 patch onto the skin daily as needed. Remove & Discard patch within 12 hours or as directed by MD What changed:  how much to take when to take this additional instructions   methocarbamol 500 MG tablet Commonly known as: ROBAXIN Take 1 tablet (500 mg total) by mouth every 8 (eight) hours as needed for muscle spasms.   metoprolol tartrate 50 MG tablet Commonly known as: LOPRESSOR Take 1 tablet (50 mg total) by mouth 2 (two) times daily.   multivitamin with minerals Tabs tablet Take 2 tablets by mouth daily.   nitroGLYCERIN 0.4 MG SL tablet Commonly known as: NITROSTAT Place 1 tablet (0.4  mg total) under the tongue every 5 (five) minutes as needed for chest pain  (up to 3 doses). What changed: reasons to take this   oxyCODONE-acetaminophen 5-325 MG tablet Commonly known as: PERCOCET/ROXICET Take 1-2 tablets by mouth every 6 (six) hours as needed for severe pain (pain score 7-10).   pantoprazole 40 MG tablet Commonly known as: PROTONIX Take 40 mg by mouth 2 (two) times daily as needed (acid reflux).   polyethylene glycol 17 g packet Commonly known as: MIRALAX / GLYCOLAX Take 17 g by mouth daily as needed for mild constipation.   ranolazine 1000 MG SR tablet Commonly known as: Ranexa Take 1 tablet (1,000 mg total) by mouth 2 (two) times daily.   STOOL SOFTENER PO Take 1 capsule by mouth daily.   VITAMIN B-12 PO Take 3 tablets by mouth daily.   vitamin E 180 MG (400 UNITS) capsule Take 400 Units by mouth daily.   VITRON-C PO Take 1 tablet by mouth daily.        Disposition: SNF   Final Dx: posterior lateral fusion T10-L2, decompression at L1-L2  Discharge Instructions     Call MD for:  difficulty breathing, headache or visual disturbances   Complete by: As directed    Call MD for:  hives   Complete by: As directed    Call MD for:  persistant dizziness or light-headedness   Complete by: As directed    Call MD for:  persistant nausea and vomiting   Complete by: As directed    Call MD for:  redness, tenderness, or signs of infection (pain, swelling, redness, odor or green/yellow discharge around incision site)   Complete by: As directed    Call MD for:  severe uncontrolled pain   Complete by: As directed    Call MD for:  temperature >100.4   Complete by: As directed    Diet - low sodium heart healthy   Complete by: As directed    Increase activity slowly   Complete by: As directed           Signed: Tiana Loft Arseniy Toomey 01/27/2023, 10:23 AM

## 2023-01-27 NOTE — TOC Transition Note (Signed)
Transition of Care [facility]) - CM/SW Discharge Note   Patient Details  Name: Tamara Nunez MRN: [id number] Date of Birth: May 09, 1955  [facility] CM/SW Contact:  Lorri Frederick, LCSW Phone Number: 01/27/2023, 12:40 PM   Clinical Narrative:   Pt discharging to [facility].  RN call report to [phone].      Final next level of care: Skilled Nursing Facility Barriers to Discharge: Barriers Resolved   Patient Goals and CMS Choice CMS Medicare.gov Compare Post Acute Care list provided to:: Patient Choice offered to / list presented to : Patient  Discharge Placement                Patient chooses bed at:  [facility]) Patient to be transferred to facility by: husband Peyton Najjar Name of family member notified: husband Peyton Najjar in room Patient and family notified of of transfer: 01/27/23  Discharge Plan and Services Additional resources added to the After Visit Summary for       Post Acute Care Choice: Skilled Nursing Facility                               Social Determinants of Health (SDOH) Interventions SDOH Screenings   Food Insecurity: No Food Insecurity (01/23/2023)  Housing: Low Risk  (01/23/2023)  Transportation Needs: No Transportation Needs (01/23/2023)  Utilities: Not At Risk (01/23/2023)  Depression (PHQ2-9): Medium Risk (02/07/2019)  Tobacco Use: Low Risk  (01/23/2023)     Readmission Risk Interventions     No data to display

## 2023-01-27 NOTE — Plan of Care (Signed)
  Problem: Elimination: Goal: Will not experience complications related to bowel motility Outcome: Progressing   Problem: Pain Management: Goal: General experience of comfort will improve Outcome: Progressing   Problem: Skin Integrity: Goal: Risk for impaired skin integrity will decrease Outcome: Progressing

## 2023-01-27 NOTE — Progress Notes (Signed)
Subjective: Patient reports doing well, pain under control  Objective: Vital signs in last 24 hours: Temp:  [98.6 F (37 C)-99 F (37.2 C)] 99 F (37.2 C) (11/19 0513) Pulse Rate:  [81-89] 81 (11/19 0513) Resp:  [16-18] 18 (11/19 0513) BP: (99-113)/(41-62) 99/50 (11/19 0513) SpO2:  [96 %-98 %] 98 % (11/19 0513)  Intake/Output from previous day: No intake/output data recorded. Intake/Output this shift: No intake/output data recorded.  Neurologic: Grossly normal  Lab Results: Lab Results  Component Value Date   WBC 9.1 01/26/2023   HGB 8.3 (L) 01/26/2023   HCT 25.6 (L) 01/26/2023   MCV 91.4 01/26/2023   PLT 250 01/26/2023   Lab Results  Component Value Date   INR 1.0 04/30/2017   BMET Lab Results  Component Value Date   NA 136 12/27/2022   K 4.0 12/27/2022   CL 105 12/27/2022   CO2 20 (L) 12/27/2022   GLUCOSE 116 (H) 12/27/2022   BUN 15 12/27/2022   CREATININE 0.92 12/27/2022   CALCIUM 9.4 12/27/2022    Studies/Results: No results found.  Assessment/Plan: S/p thoracolumbar fusion. Awaiting rehab placement. Ok to discharge when bed ready.   LOS: 4 days    Tiana Loft [facility] 01/27/2023, 7:56 AM

## 2023-01-27 NOTE — Discharge Planning (Signed)
Patient alert and oriented. IV removed. Discharge teaching given to the patient and her spouse in the room. Discharge teaching also given to [place] at [facility]. Dishcarge summary placed in discharge packet along with written prescriptions written by discharge provider. Patient was transported via car by her husband.

## 2023-01-28 NOTE — Progress Notes (Signed)
Hypoglycemia level 2 Progress Note   Date: 01/27/2023  Patient Name: Tamara Nunez        MRN#: [id number]  Review the patient's clinical findings supports the diagnosis of:   Hypoglycemia (please specify Level from definitions below)

## 2023-01-29 MED FILL — Heparin Sodium (Porcine) Inj 1000 Unit/ML: INTRAMUSCULAR | Qty: 30 | Status: AC

## 2023-02-19 ENCOUNTER — Other Ambulatory Visit: Payer: Self-pay | Admitting: "Endocrinology

## 2023-02-27 ENCOUNTER — Encounter: Payer: Self-pay | Admitting: Physician Assistant

## 2023-02-27 ENCOUNTER — Ambulatory Visit: Payer: 59 | Attending: Physician Assistant | Admitting: Physician Assistant

## 2023-02-27 VITALS — BP 134/73 | HR 67 | Ht 64.0 in | Wt 174.6 lb

## 2023-02-27 DIAGNOSIS — E785 Hyperlipidemia, unspecified: Secondary | ICD-10-CM | POA: Diagnosis not present

## 2023-02-27 DIAGNOSIS — I25810 Atherosclerosis of coronary artery bypass graft(s) without angina pectoris: Secondary | ICD-10-CM

## 2023-02-27 DIAGNOSIS — E119 Type 2 diabetes mellitus without complications: Secondary | ICD-10-CM | POA: Diagnosis not present

## 2023-02-27 DIAGNOSIS — I10 Essential (primary) hypertension: Secondary | ICD-10-CM | POA: Diagnosis not present

## 2023-02-27 DIAGNOSIS — D649 Anemia, unspecified: Secondary | ICD-10-CM

## 2023-02-27 NOTE — Progress Notes (Signed)
Cardiology Office Note:  .   Date:  02/27/2023  ID:  Tamara Nunez, DOB [birth year], MRN [id number] PCP: Theodoro Kos, MD  [facility] Providers Cardiologist:  Chrystie Nose, MD     History of Present Illness: .   Tamara Nunez is a 67 y.o. female with a hx of CAD s/p CABG 2006 with LIMA to LAD, SVG to distal LAD, hypertension, hyperlipidemia, DM II, GERD, and fibromyalgia.  Cardiac catheterization in 2009 showed widely patent grafts.  Myoview in March 2014 was low risk. Patient underwent repeat cardiac catheterization in July 2018 that demonstrated 80% proximal RCA lesion that was treated with DES, she also had atretic LIMA to LAD with 100% occlusion of mid LAD, patent SVG to distal LAD, EF 55 to 60%.  Unfortunately despite intervention, she continued to have intermittent chest discomfort.  Cardiac catheterization performed in April 2020 demonstrated a single obstructive vessel with chronic total occlusion of mid LAD that was bypassed by SVG graft.  New 90% proximal D1 that was felt to be culprit lesion, this was treated with a drug-eluting stent.  She again underwent cardiac catheterization by Dr. [place] in June 2021 that demonstrated 75% distal RCA lesion that was treated with drug-eluting stent.  Her chest pain recurred.  We eventually recommended relook cath on 03/08/2020 which showed patent stents, atretic LIMA to LAD, patent SVG to LAD, no new stenosis to explain her overall symptom.  Medical therapy was recommended.  Lower extremity Doppler performed in December 2021 revealed noncompressible ABI bilaterally with tibial vessel disease but no large vessel disease. It is recommended she continue dual antiplatelet therapy lifelong.  She was placed on Pletal for lower extremity claudication symptoms. I stopped Pletal in Apr 2024 to reduce bruising.  Given intermittent chest discomfort, I was hesitant to stop aspirin or Brilinta.  I increased her Imdur to 60 mg daily and recommended  outpatient PET stress test.  PET stress test obtained on 08/06/2022 showed EF 70%, overall low risk study with no ischemia or infarction.  Patient presents today for follow-up.  She denies any recent chest pain or shortness of breath.  She apparently became very anemic with hemoglobin down to 6 in early September and had to receive iron infusion at [facility].  She says immediately after her discharge, she felt very tired and felt cold.  Symptom is consistent with anemia.  I will obtain repeat CBC today.  Otherwise, her surgery wound on the back seems to be well-healed without any drainage.  Overall, she is doing well from the cardiac perspective and can follow-up in 6 months.  ROS:   She denies chest pain, palpitations, dyspnea, pnd, orthopnea, n, v, dizziness, syncope, edema, weight gain, or early satiety. All other systems reviewed and are otherwise negative except as noted above.    Studies Reviewed: .        Cardiac Studies & Procedures   CARDIAC CATHETERIZATION  CARDIAC CATHETERIZATION 03/08/2020  Narrative  Non-stenotic Ost 1st Diag-2 lesion was previously treated.  Non-stenotic Ost 1st Diag-1 lesion was previously treated.  Prox LAD to Mid LAD lesion is 100% stenosed.  Prox LAD-2 lesion is 100% stenosed.  Non-stenotic Prox LAD-1 lesion was previously treated.  Ost 2nd Mrg lesion is 70% stenosed.  Prox Cx lesion is 40% stenosed.  Mid Cx lesion is 20% stenosed.  Non-stenotic Ost Cx lesion was previously treated.  Prox RCA lesion is 10% stenosed.  Prox RCA to Mid RCA lesion is 30%  stenosed.  LIMA.  Origin to Mid Graft lesion is 99% stenosed.  SVG and is normal in caliber.  Mid RCA to Dist RCA lesion is 35% stenosed.  The left ventricular systolic function is normal.  LV end diastolic pressure is normal.  The left ventricular ejection fraction is 55-65% by visual estimate.  1. CAD with continued patency of prior stents. No new stenoses to  explain her recent symptoms. 2. Patent SVG to LAD 3. Atretic LIMA to the LAD which is supplied by the SVG 4. Normal LV function 5. Normal LVEDP  Plan: continue medical therapy.  Findings Coronary Findings Diagnostic  Dominance: Right  Left Anterior Descending Vessel is large. Non-stenotic Prox LAD-1 lesion was previously treated. Prox LAD-2 lesion is 100% stenosed. Prox LAD to Mid LAD lesion is 100% stenosed. The lesion is chronically occluded.  First Diagonal Branch Vessel is moderate in size. Non-stenotic Ost 1st Diag-1 lesion was previously treated. Non-stenotic Ost 1st Diag-2 lesion was previously treated.  Second Diagonal Branch Vessel is small in size.  Third Diagonal Branch Vessel is small in size.  Left Circumflex Non-stenotic Ost Cx lesion was previously treated. Prox Cx lesion is 40% stenosed. Mid Cx lesion is 20% stenosed.  First Obtuse Marginal Branch Vessel is small in size.  Second Obtuse Marginal Branch Vessel is small in size. Ost 2nd Mrg lesion is 70% stenosed.  Third Obtuse Marginal Branch Vessel is large in size.  Right Coronary Artery Vessel is moderate in size. Prox RCA lesion is 10% stenosed. The lesion was previously treated using a drug eluting stent over 2 years ago. Prox RCA to Mid RCA lesion is 30% stenosed. The lesion is calcified. Mid RCA to Dist RCA lesion is 35% stenosed. The lesion is severely calcified. The lesion was previously treated using a drug eluting stent between 6-12 months ago.  LIMA LIMA Graft To Mid LAD LIMA. The graft is atretic. Origin to Mid Graft lesion is 99% stenosed.  Saphenous Graft To Mid LAD SVG and is normal in caliber.  Intervention  No interventions have been documented.   CARDIAC CATHETERIZATION  CARDIAC CATHETERIZATION 08/16/2019  Narrative  Mid RCA to Dist RCA lesion is 75% stenosed.  Post intervention, there is a 0% residual stenosis.  A drug-eluting stent was successfully placed using  a STENT RESOLUTE ONYX 2.5X34.  1. Successful PCI of the mid RCA using orbital atherectomy and DES x 1  Plan: DAPT for at least one year. Anticipate DC tomorrow.  Findings Coronary Findings Diagnostic  Dominance: Right  Left Anterior Descending Vessel is large. Previously placed Prox LAD-1 stent (unknown type) is widely patent. Prox LAD-2 lesion is 100% stenosed. Prox LAD to Mid LAD lesion is 100% stenosed. The lesion is chronically occluded.  First Diagonal Branch Vessel is moderate in size. Previously placed Ost 1st Diag-1 stent (unknown type) is widely patent. Non-stenotic Ost 1st Diag-2 lesion was previously treated.  Second Diagonal Branch Vessel is small in size.  Third Diagonal Branch Vessel is small in size.  Left Circumflex Non-stenotic Ost Cx lesion was previously treated. Prox Cx lesion is 40% stenosed. Mid Cx lesion is 20% stenosed.  First Obtuse Marginal Branch Vessel is small in size.  Second Obtuse Marginal Branch Vessel is small in size. Ost 2nd Mrg lesion is 70% stenosed.  Third Obtuse Marginal Branch Vessel is large in size.  Right Coronary Artery Vessel is moderate in size. Prox RCA lesion is 10% stenosed. The lesion was previously treated using a drug eluting stent  between 6-12 months ago. Prox RCA to Mid RCA lesion is 30% stenosed. The lesion is calcified. Mid RCA to Dist RCA lesion is 75% stenosed.  LIMA LIMA Graft To Mid LAD LIMA. The graft is atretic. Origin to Mid Graft lesion is 99% stenosed.  Saphenous Graft To Mid LAD SVG and is normal in caliber.  Intervention  Mid RCA to Dist RCA lesion Stent CATHETER LAUNCHER 6FR AL1 guide catheter was inserted. Lesion crossed with guidewire using a WIRE ASAHI PROWATER 180CM. Pre-stent angioplasty was performed using a BALLOON SAPPHIRE 2.25X15. A drug-eluting stent was successfully placed using a STENT RESOLUTE ONYX 2.5X34. Stent strut is well apposed. Stent does not overlap previously placed  stentPost-stent angioplasty was performed using a BALLOON SAPPHIRE [place] 3.0X18. Maximum pressure:  18 atm. Atherectomy CATHETER LAUNCHER 6FR AL1 guide catheter was inserted. WIRE VIPERWIRE COR FLEX TIP guidewire was used to cross lesion. Orbital atherectomy was performed using a CROWN DIAMONDBACK CLASSIC 1.25. 4 passes taken. Post-Intervention Lesion Assessment The intervention was successful. Pre-interventional TIMI flow is 3. Post-intervention TIMI flow is 3. No complications occurred at this lesion. There is a 0% residual stenosis post intervention.   STRESS TESTS  NM PET CT CARDIAC PERFUSION MULTI W/ABSOLUTE BLOODFLOW 08/06/2022  Narrative   The study is normal. The study is low risk.   LV perfusion is normal. There is no evidence of ischemia. There is no evidence of infarction.   Rest left ventricular function is normal. Rest EF: 70 %. Stress left ventricular function is normal. Stress EF: 70 %. End diastolic cavity size is normal. End systolic cavity size is normal. No evidence of transient ischemic dilation (TID) noted.   Myocardial blood flow was computed to be 0.57ml/g/min at rest and 1.30ml/g/min at stress. Global myocardial blood flow reserve was 2.29 and was normal.   Coronary calcium assessment not performed due to prior revascularization. Aortic atherosclersosis seen.   Electronically signed by: Parke Poisson, MD  CLINICAL DATA:  This over-read does not include interpretation of cardiac or coronary anatomy or pathology. The cardiac PET-CT interpretation by the cardiologist is attached.  COMPARISON:  None Available.  FINDINGS: Atherosclerotic calcifications in the thoracic aorta. Status post median sternotomy for CABG. Within the visualized portions of the thorax there are no suspicious appearing pulmonary nodules or masses, there is no acute consolidative airspace disease, no pleural effusions, no pneumothorax and no lymphadenopathy. Visualized portions of the upper  abdomen are unremarkable. There are no aggressive appearing lytic or blastic lesions noted in the visualized portions of the skeleton.  IMPRESSION: 1.  Aortic Atherosclerosis (ICD10-I70.0).   Electronically Signed By: Trudie Reed M.D. On: 08/06/2022 08:59              Risk Assessment/Calculations:             Physical Exam:   VS:  BP 134/73   Pulse 67   Ht 5\' 4"  (1.626 m)   Wt 174 lb 9.6 oz (79.2 kg)   SpO2 98%   BMI 29.97 kg/m    Wt Readings from Last 3 Encounters:  02/27/23 174 lb 9.6 oz (79.2 kg)  01/23/23 172 lb (78 kg)  01/06/23 172 lb 8 oz (78.2 kg)    GEN: Well nourished, well developed in no acute distress NECK: No JVD; No carotid bruits CARDIAC: RRR, no murmurs, rubs, gallops RESPIRATORY:  Clear to auscultation without rales, wheezing or rhonchi  ABDOMEN: Soft, non-tender, non-distended EXTREMITIES:  No edema; No deformity   ASSESSMENT AND PLAN: .  Coronary Artery Disease History of CABG, multiple stents, and persistent chest pain despite interventions. Last catheterization in December 2021 showed patent stents and no new stenosis. Currently on Brilinta monotherapy after discontinuation of aspirin due to bruising. -Continue Brilinta monotherapy.  Hypertension -Blood pressure well-controlled on current therapy.  She did have hypotension immediately after her surgery, however this has resolved.  Hyperlipidemia -On atorvastatin  DM2: Managed by primary care provider  Anemia Recent back surgery with subsequent drop in hemoglobin to 6.0, received iron infusion at [facility] cancer center around 12/9. -Check CBC today to assess current hemoglobin level. -Continue iron infusions as planned by primary care doctor.       Dispo: Follow-up in 6 months with Dr. Rennis Golden  Signed, Azalee Course, [place]

## 2023-02-27 NOTE — Patient Instructions (Signed)
Medication Instructions:  NO CHANGES *If you need a refill on your cardiac medications before your next appointment, please call your pharmacy*   Lab Work: CBC TODAY If you have labs (blood work) drawn today and your tests are completely normal, you will receive your results only by: MyChart Message (if you have MyChart) OR A paper copy in the mail If you have any lab test that is abnormal or we need to change your treatment, we will call you to review the results.   Testing/Procedures: NO TESTING   Follow-Up: At [facility], you and your health needs are our priority.  As part of our continuing mission to provide you with exceptional heart care, we have created designated Provider Care Teams.  These Care Teams include your primary Cardiologist (physician) and Advanced Practice Providers (APPs -  Physician Assistants and Nurse Practitioners) who all work together to provide you with the care you need, when you need it.  Your next appointment:   6 month(s)  Provider:   Chrystie Nose, MD

## 2023-02-28 LAB — CBC
Hematocrit: 33.6 % — ABNORMAL LOW (ref 34.0–46.6)
Hemoglobin: 10.3 g/dL — ABNORMAL LOW (ref 11.1–15.9)
MCH: 27.4 pg (ref 26.6–33.0)
MCHC: 30.7 g/dL — ABNORMAL LOW (ref 31.5–35.7)
MCV: 89 fL (ref 79–97)
Platelets: 342 10*3/uL (ref 150–450)
RBC: 3.76 x10E6/uL — ABNORMAL LOW (ref 3.77–5.28)
RDW: 14.7 % (ref 11.7–15.4)
WBC: 5.3 10*3/uL (ref 3.4–10.8)

## 2023-03-19 ENCOUNTER — Other Ambulatory Visit: Payer: Self-pay

## 2023-03-19 DIAGNOSIS — E1159 Type 2 diabetes mellitus with other circulatory complications: Secondary | ICD-10-CM

## 2023-03-19 MED ORDER — ACCU-CHEK GUIDE ME W/DEVICE KIT: PACK | 0 refills | Status: AC

## 2023-03-24 ENCOUNTER — Other Ambulatory Visit: Payer: Self-pay

## 2023-03-24 MED ORDER — ACCU-CHEK SOFTCLIX LANCETS MISC: 3 refills | Status: AC

## 2023-03-24 MED ORDER — ACCU-CHEK GUIDE TEST VI STRP: 1.0000 | ORAL_STRIP | Freq: Two times a day (BID) | 3 refills | Status: AC

## 2023-04-16 ENCOUNTER — Telehealth: Payer: Self-pay | Admitting: Internal Medicine

## 2023-04-16 ENCOUNTER — Other Ambulatory Visit: Payer: Self-pay | Admitting: Internal Medicine

## 2023-04-16 MED ORDER — NITROGLYCERIN 0.4 MG/SPRAY TL SOLN
1.0000 | 2 refills | Status: DC | PRN
  Filled 2023-04-16: qty 4.9, 25d supply, fill #0

## 2023-04-16 NOTE — Telephone Encounter (Signed)
 Called and spoke to patient. Verified name and DOB. Patient would like a prescription for nitroglycerin  spray. She is currently has the tablets. It is noted that she did have the spray at one time. Please advise if okay to change to nitroglycerin  nasal spray.

## 2023-04-16 NOTE — Telephone Encounter (Signed)
 Pt c/o medication issue:  1. Name of Medication: Nitroglycerin  Spray  2. How are you currently taking this medication (dosage and times per day)? Pt did not have bottle  3. Are you having a reaction (difficulty breathing--STAT)? No   4. What is your medication issue? She wants a new script sent in for a cheaper spray because the one she had is $600

## 2023-04-17 ENCOUNTER — Other Ambulatory Visit (HOSPITAL_COMMUNITY): Payer: Self-pay

## 2023-04-17 ENCOUNTER — Other Ambulatory Visit: Payer: Self-pay

## 2023-04-17 MED ORDER — NITROGLYCERIN 0.4 MG/SPRAY TL SOLN: 1.0000 | 2 refills | Status: DC | PRN

## 2023-04-17 NOTE — Telephone Encounter (Signed)
 Called patient with no answer. Left detailed message that prescription sent to [facility] in [place].

## 2023-04-17 NOTE — Telephone Encounter (Signed)
 Called patient with no answer. Left message to call back. Will notify patient Dr Maximo Spar ordered nitroglycerin  nasal spray.

## 2023-04-17 NOTE — Telephone Encounter (Signed)
 Pt called pat and I told her Dr Mona had ordered the spray. It was sent to [facility]. Needs to go to  [id number] - [place], [place] - [address] Phone: [phone]  Fax: [phone]    Please re-send to correct pharmacy Thanks

## 2023-04-18 ENCOUNTER — Other Ambulatory Visit: Payer: Self-pay

## 2023-06-08 ENCOUNTER — Telehealth: Payer: Self-pay | Admitting: Internal Medicine

## 2023-06-08 MED ORDER — BRILINTA 60 MG PO TABS: 60.0000 mg | ORAL_TABLET | Freq: Two times a day (BID) | ORAL | 2 refills | Status: AC

## 2023-06-08 NOTE — Telephone Encounter (Signed)
 Pt's medication was sent to pt's pharmacy as requested. Confirmation received.

## 2023-06-08 NOTE — Telephone Encounter (Signed)
*  STAT* If patient is at the pharmacy, call can be transferred to refill team.   1. Which medications need to be refilled? (please list name of each medication and dose if known)   BRILINTA 60 MG TABS tablet   2. Would you like to learn more about the convenience, safety, & potential cost savings by using the [facility]?   3. Are you open to using the [facility] (Type [facility]. ).   4. Which pharmacy/location (including street and city if local pharmacy) is medication to be sent to?  [facility] - [place], [place] - [address]   5. Do they need a 30 day or 90 day supply?   90 day  Patient stated she has a few tablets left.  Patient has an appointment scheduled with Dr. Rennis Golden on 7/3.

## 2023-09-09 IMAGING — CT CT KNEE*L* W/CM
3 series · 12 of 33 positions shown, 14 images · IV contrast (omnipaque)
Comparison: 03/04/2021

CLINICAL DATA: Left knee surgery 02/26/2021, increased pain and
swelling left knee

EXAM:
CT OF THE LEFT KNEE WITH CONTRAST
TECHNIQUE: Multidetector CT imaging was performed following the standard
protocol during bolus administration of intravenous contrast.
CONTRAST:  80mL OMNIPAQUE IOHEXOL 350 MG/ML SOLN

[Series 4: axial st · axial · 0.44mm/px · z∈[-358,-178]mm · 4 of 132 slices shown, 5 images]
[im 21/132  soft-tissue]
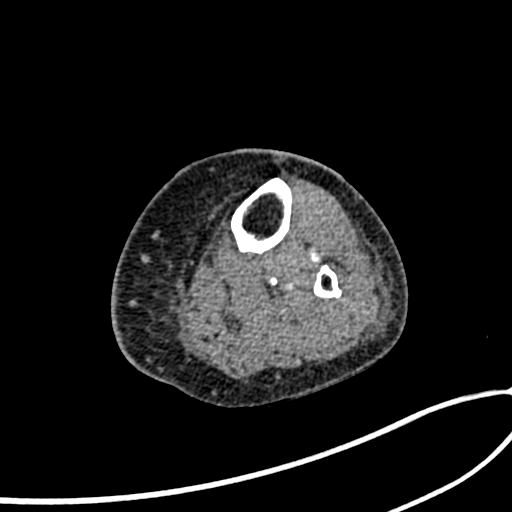
[im 21/132  bone]
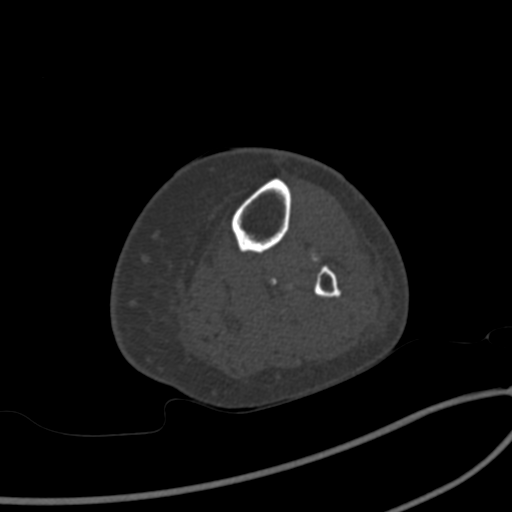
[im 51/132  bone]
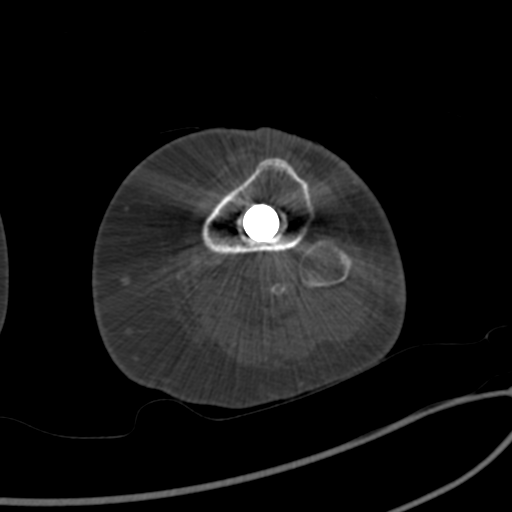
[im 81/132  bone]
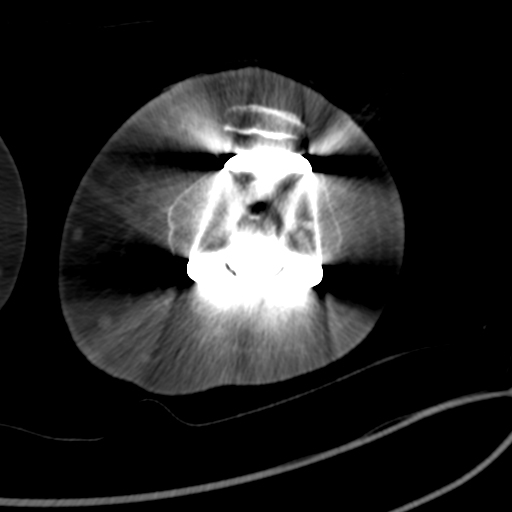
[im 111/132  bone]
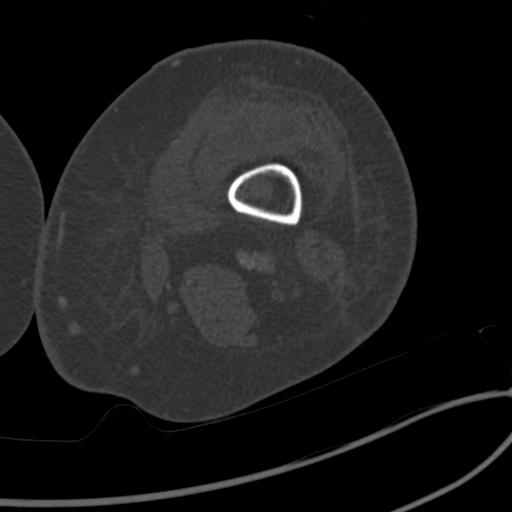

[Series 9: coronal st · coronal · 0.37mm/px · 3 of 84 slices shown]
[im 17/84  bone]
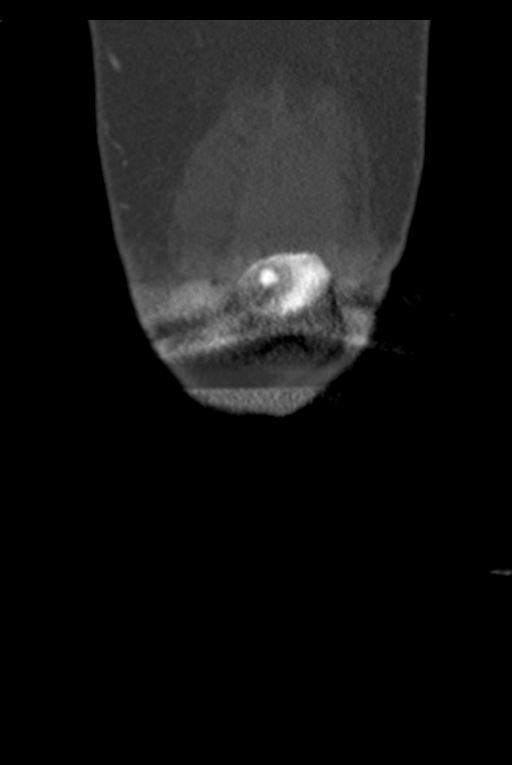
[im 34/84  bone]
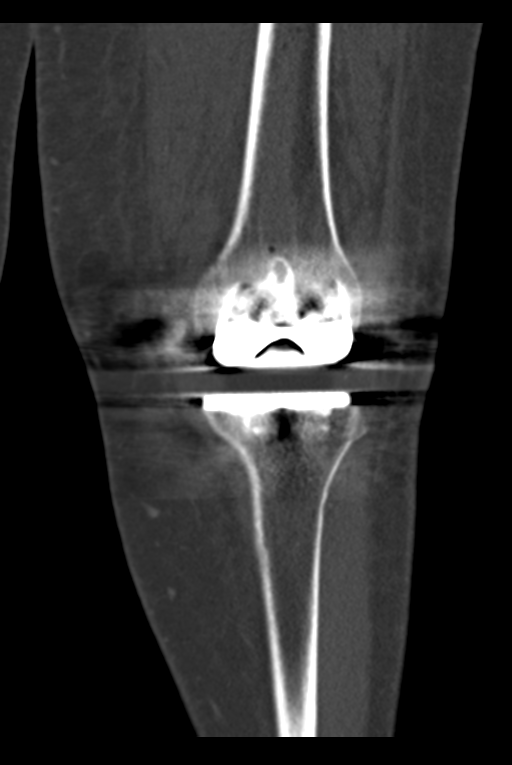
[im 50/84  bone]
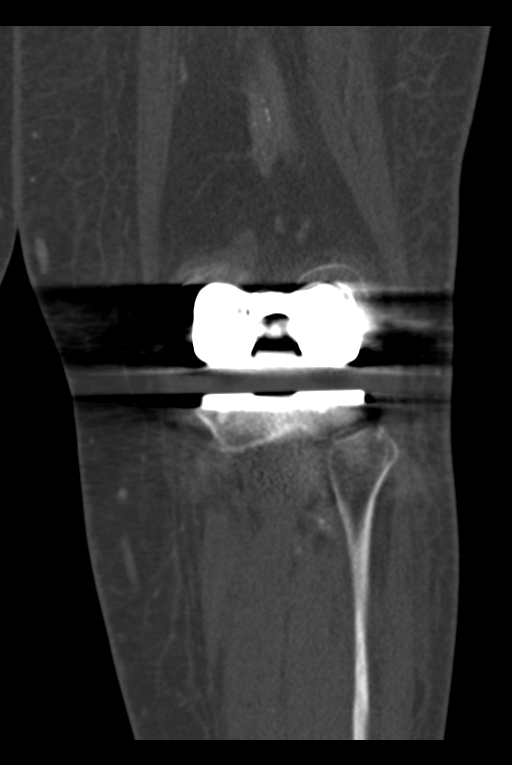

[Series 10: sagittal st · sagittal · 0.33mm/px · 5 of 91 slices shown, 6 images]
[im 31/91  bone]
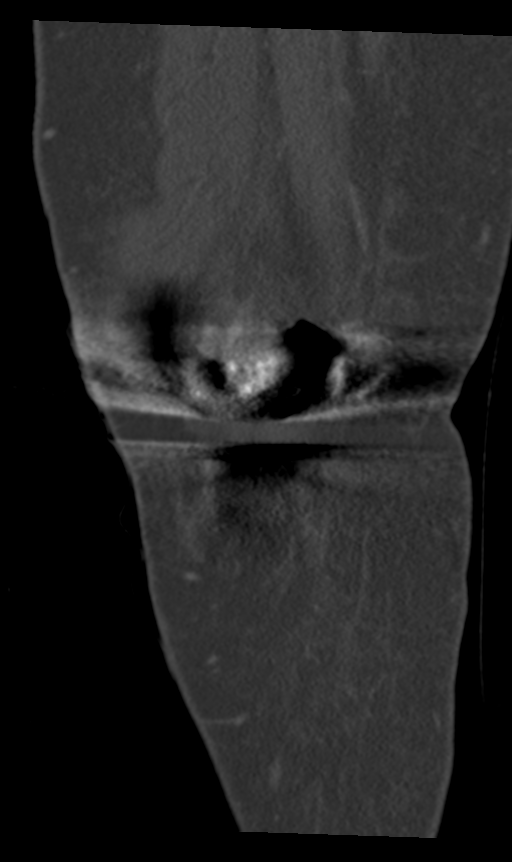
[im 38/91  bone]
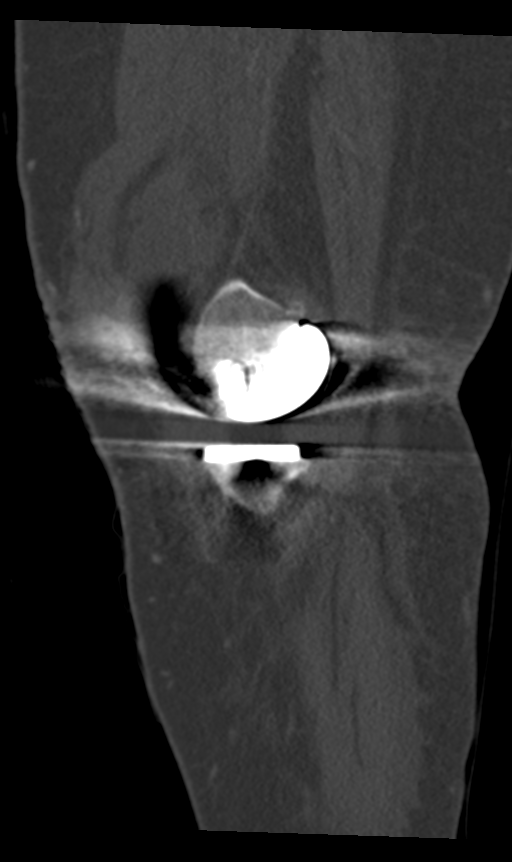
[im 46/91  soft-tissue]
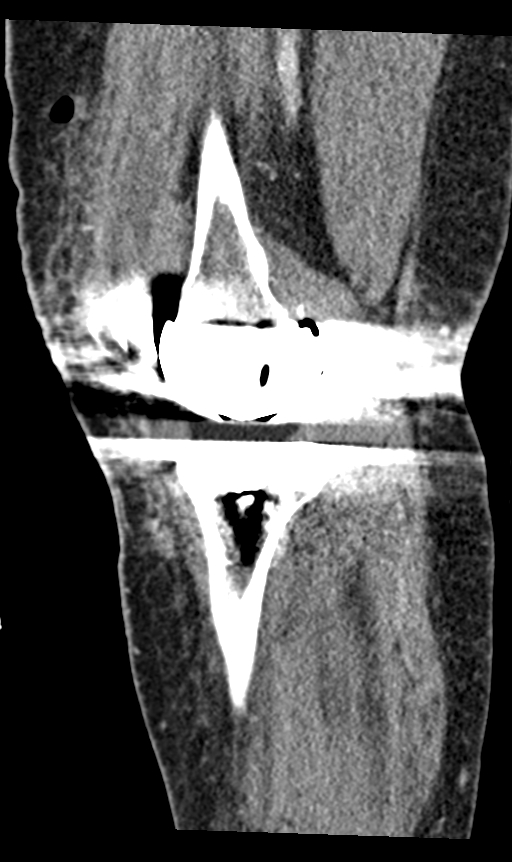
[im 46/91  bone]
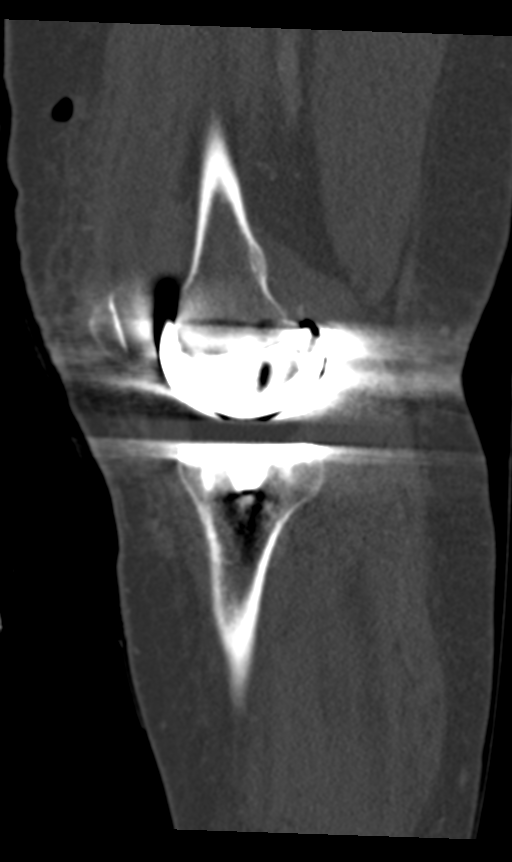
[im 53/91  bone]
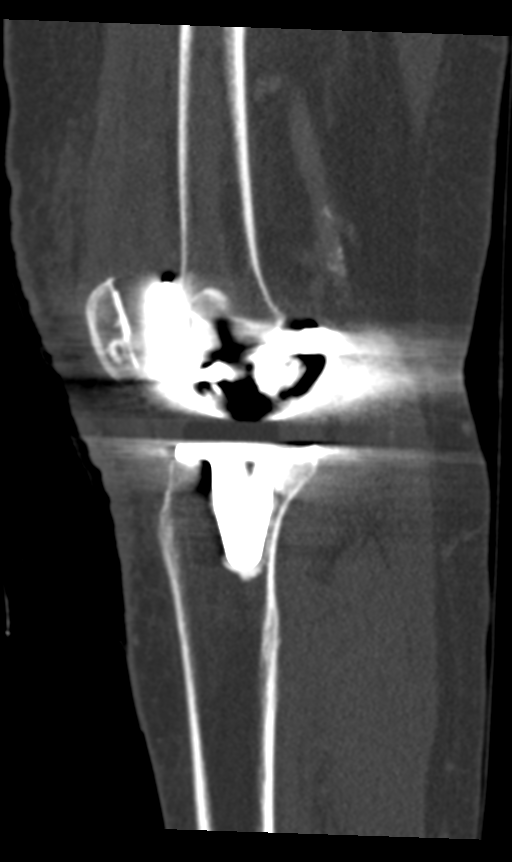
[im 61/91  bone]
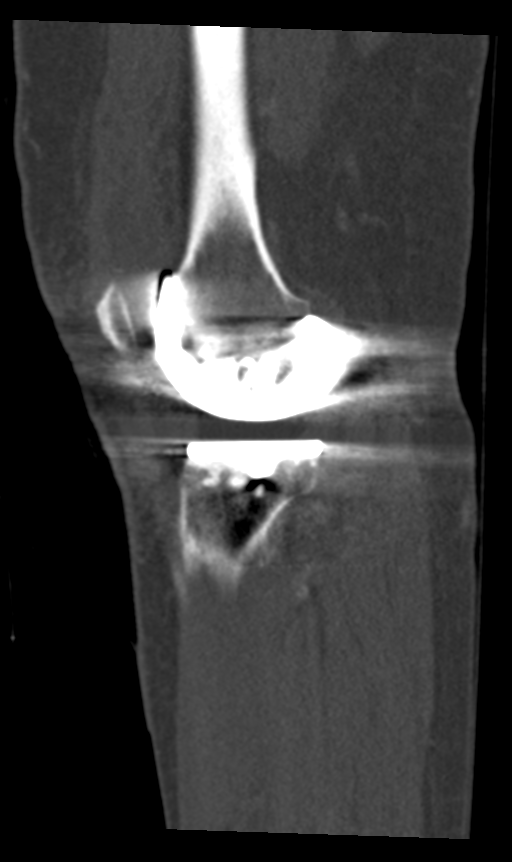

[12 of 33 positions shown; findings below may reference images not displayed]

FINDINGS: Bones/Joint/Cartilage

Three component left knee arthroplasty is identified in the expected
position without evidence of gross complication. Evaluation is
limited by streak artifact from the orthopedic hardware.

I do not see any acute or destructive bony lesions.

There is a large joint effusion, with a small amount of gas within
the joint space likely of from recent surgical intervention. The
sterility of the effusion cannot be assessed by imaging.

Ligaments

Suboptimally assessed by CT.

Muscles and Tendons

No gross abnormalities.

Soft tissues

There is mild subcutaneous edema surrounding the left knee.
Postsurgical changes from anterior incision from knee replacement,
with minimal soft tissue gas in the subcutaneous fat anteriorly
likely from recent surgical intervention. I do not see any soft
tissue fluid collections or definite abscess at this time. Diffuse
atherosclerosis.
IMPRESSION: 1. Left knee arthroplasty as above, with significant streak artifact
from orthopedic hardware limiting the evaluation.
2. Large joint effusion, with a small amount of gas within the joint
space. This is likely related to recent surgical intervention.
Sterility of the effusion cannot be assessed by imaging.
3. Postsurgical changes within the anterior soft tissues.
4. Mild subcutaneous fat stranding surrounding the left knee which
could reflect edema or cellulitis. No enhancing fluid collection or
abscess at this time.

## 2023-09-10 ENCOUNTER — Ambulatory Visit: Attending: Internal Medicine | Admitting: Internal Medicine

## 2023-09-10 ENCOUNTER — Telehealth: Payer: Self-pay

## 2023-09-10 ENCOUNTER — Encounter: Payer: Self-pay | Admitting: Internal Medicine

## 2023-09-10 VITALS — BP 132/72 | HR 56 | Wt 167.0 lb

## 2023-09-10 DIAGNOSIS — Z951 Presence of aortocoronary bypass graft: Secondary | ICD-10-CM

## 2023-09-10 DIAGNOSIS — I25810 Atherosclerosis of coronary artery bypass graft(s) without angina pectoris: Secondary | ICD-10-CM

## 2023-09-10 DIAGNOSIS — I10 Essential (primary) hypertension: Secondary | ICD-10-CM | POA: Diagnosis not present

## 2023-09-10 DIAGNOSIS — E119 Type 2 diabetes mellitus without complications: Secondary | ICD-10-CM | POA: Diagnosis not present

## 2023-09-10 DIAGNOSIS — E785 Hyperlipidemia, unspecified: Secondary | ICD-10-CM

## 2023-09-10 NOTE — Telephone Encounter (Signed)
 That would need to go the insurance plan. Patient is insured with [employer]. [facility] fax number for submitting drug tier exception requests is [phone]. Hope this helps.

## 2023-09-10 NOTE — Progress Notes (Signed)
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 Premature atrial complexes are now Present Confirmed by Mona Kent [phone]) on 09/10/2023 8:21:05 AM    ASSESSMENT: Unstable angina with recent PCI to the distal RCA with atherectomy and DES x 1 (08/2019) PCI with DES to the ostial circumflex with a 4 x 12 mm Synergy DES (04/2017) Coronary artery disease status post CABG (LIMA to LAD-atretic, SVG to distal LAD) - 2006 Hypertension Dyslipidemia, goal LDL <55 (very high risk) Diabetes type 2 Tingling and coolness of the fingers, concerning for Raynaud's phenomena Recent small bowel obstruction and status post exploratory laparotomy RBBB Lower extremity edema-secondary to neuropathy Chronic back pain with peripheral neuropathy  PLAN: 1.   Mrs. Nunez seems to be well at this point without any anginal symptoms.  She underwent a PET perfusion study last year which was negative for ischemia.  She had successful back surgery but was complicated by some postoperative anemia which took some time to improve.  Her hemoglobin is now in the normal range.  She denies any anginal symptoms.  She has had bruising issues related to dual antiplatelet therapy and I advise she could stay off of aspirin  but should maintain Brilinta  60 mg twice daily.  I will reach out to her insurance company to see if we can get a tier exception for the medication.  Plan otherwise follow-up with us  annually or sooner as necessary.  Kent KYM Mona, MD, [facility], FNLA, FACP  [place]  [facility]  HeartCare  Medical Director of the Advanced Lipid Disorders &  Cardiovascular Risk Reduction Clinic Diplomate of the American Board of Clinical Lipidology Attending Cardiologist  Direct Dial: [phone]  Fax: [phone]  Website:  [email or web address]   Kent BROCKS Siria Calandro 09/10/2023, 8:21 AM

## 2023-09-10 NOTE — Patient Instructions (Signed)
 Medication Instructions:  Your physician recommends that you continue on your current medications as directed. Please refer to the Current Medication list given to you today.  *If you need a refill on your cardiac medications before your next appointment, please call your pharmacy*  Follow-Up: At [facility], you and your health needs are our priority.  As part of our continuing mission to provide you with exceptional heart care, our providers are all part of one team.  This team includes your primary Cardiologist (physician) and Advanced Practice Providers or APPs (Physician Assistants and Nurse Practitioners) who all work together to provide you with the care you need, when you need it.  Your next appointment:   1 year with Dr. Maximo Spar

## 2023-09-10 NOTE — Telephone Encounter (Signed)
-----   Message from Nurse Jocabed C sent at 09/10/2023  8:52 AM EDT ----- Regarding: Brillinta Tier placement application Good morning team! Dr. Mona has a letter ready to be sent on behalf of this patient on Brillinta and requesting a tier exception. Do I fax this to [facility] ?

## 2023-10-06 ENCOUNTER — Other Ambulatory Visit: Payer: Self-pay | Admitting: *Deleted

## 2023-10-06 DIAGNOSIS — I10 Essential (primary) hypertension: Secondary | ICD-10-CM

## 2023-10-06 DIAGNOSIS — E782 Mixed hyperlipidemia: Secondary | ICD-10-CM

## 2023-10-06 DIAGNOSIS — E1159 Type 2 diabetes mellitus with other circulatory complications: Secondary | ICD-10-CM

## 2023-10-06 DIAGNOSIS — E559 Vitamin D deficiency, unspecified: Secondary | ICD-10-CM

## 2023-10-08 LAB — COMPREHENSIVE METABOLIC PANEL WITH GFR
ALT: 41 IU/L — ABNORMAL HIGH (ref 0–32)
AST: 36 IU/L (ref 0–40)
Albumin: 4.1 g/dL (ref 3.9–4.9)
Alkaline Phosphatase: 152 IU/L — ABNORMAL HIGH (ref 44–121)
BUN/Creatinine Ratio: 22 (ref 12–28)
BUN: 20 mg/dL (ref 8–27)
Bilirubin Total: 0.4 mg/dL (ref 0.0–1.2)
CO2: 20 mmol/L (ref 20–29)
Calcium: 9 mg/dL (ref 8.7–10.3)
Chloride: 105 mmol/L (ref 96–106)
Creatinine, Ser: 0.89 mg/dL (ref 0.57–1.00)
Globulin, Total: 2.9 g/dL (ref 1.5–4.5)
Glucose: 85 mg/dL (ref 70–99)
Potassium: 4.7 mmol/L (ref 3.5–5.2)
Sodium: 141 mmol/L (ref 134–144)
Total Protein: 7 g/dL (ref 6.0–8.5)
eGFR: 70 mL/min/1.73 (ref >59)

## 2023-10-08 LAB — LIPID PANEL
Chol/HDL Ratio: 1.7 ratio (ref 0.0–4.4)
Cholesterol, Total: 137 mg/dL (ref 100–199)
HDL: 81 mg/dL (ref >39)
LDL Chol Calc (NIH): 45 mg/dL (ref 0–99)
Triglycerides: 51 mg/dL (ref 0–149)
VLDL Cholesterol Cal: 11 mg/dL (ref 5–40)

## 2023-10-08 LAB — TSH: TSH: 1.52 u[IU]/mL (ref 0.450–4.500)

## 2023-10-08 LAB — T4, FREE: Free T4: 1.15 ng/dL (ref 0.82–1.77)

## 2023-10-13 ENCOUNTER — Encounter: Payer: Self-pay | Admitting: "Endocrinology

## 2023-10-13 ENCOUNTER — Ambulatory Visit (INDEPENDENT_AMBULATORY_CARE_PROVIDER_SITE_OTHER): Payer: Medicare Other | Admitting: "Endocrinology

## 2023-10-13 VITALS — BP 134/68 | HR 64 | Ht 64.0 in | Wt 165.2 lb

## 2023-10-13 DIAGNOSIS — E559 Vitamin D deficiency, unspecified: Secondary | ICD-10-CM | POA: Diagnosis not present

## 2023-10-13 DIAGNOSIS — I10 Essential (primary) hypertension: Secondary | ICD-10-CM | POA: Diagnosis not present

## 2023-10-13 DIAGNOSIS — E782 Mixed hyperlipidemia: Secondary | ICD-10-CM | POA: Diagnosis not present

## 2023-10-13 DIAGNOSIS — E1159 Type 2 diabetes mellitus with other circulatory complications: Secondary | ICD-10-CM | POA: Diagnosis not present

## 2023-10-13 MED ORDER — CHOLECALCIFEROL 50 MCG (2000 UT) PO CAPS: 1.0000 | ORAL_CAPSULE | Freq: Every day | ORAL | 3 refills | Status: AC

## 2023-10-13 NOTE — Progress Notes (Signed)
 10/13/2023, 4:53 PM            Endocrinology follow-up note  Subjective:    Patient ID: Tamara Nunez, female    DOB: 24-Aug-1955.  Tamara Nunez is being seen in follow-up for type 2 diabetes which is currently controlled on diet and exercise with A1c of 5.5% improving from 6.3%.    PMD:   Ezzard Elsie NOVAK, MD.   Past Medical History:  Diagnosis Date   Arthritis    in my back (05/06/2017)   CAD (coronary artery disease)    CABG 2006, 09/2016 DES RCA, 04/2017 DES Circ, 06/2018 DES 1st diag   Chronic back pain    Coronary artery disease    a. s/p CABG in 2006 with LIMA-LAD, SVG-dLAD. b. cath in 2009 showing patent grafts. c. 09/2016: cath with occluded LAD but patent SVG-dLAD. LIMA was atretic. pRCA with 80% stenosis --> treated with DES   Depression    Fibromyalgia    GERD (gastroesophageal reflux disease)    Hyperlipidemia    Hypertension    Type II diabetes mellitus (HCC)     Past Surgical History:  Procedure Laterality Date   ABDOMINAL HERNIA REPAIR  2018   BACK SURGERY     CARDIAC CATHETERIZATION  01/13/2008   COLONOSCOPY     CORONARY ANGIOPLASTY WITH STENT PLACEMENT  09/26/2016   Prox RCA lesion, 80 %stenosed. DES    CORONARY ARTERY BYPASS GRAFT  12/23/2004   2 vessel - LIMA to LAD (atretic), SVG to distal LAD   CORONARY ATHERECTOMY N/A 08/16/2019   Procedure: CORONARY ATHERECTOMY;  Surgeon: Swaziland, Peter M, MD;  Location[facility] INVASIVE CV LAB;  Service: Cardiovascular;  Laterality: N/A;   CORONARY STENT INTERVENTION N/A 09/26/2016   Procedure: Coronary Stent Intervention;  Surgeon: Anner Alm ORN, MD;  Location: [facility] INVASIVE CV LAB;  Service: Cardiovascular;  Laterality: N/A;   CORONARY STENT INTERVENTION N/A 05/06/2017   Procedure: CORONARY STENT INTERVENTION;  Surgeon: Verlin Lonni BIRCH, MD;  Location: [facility] INVASIVE CV LAB;  Service: Cardiovascular;  Laterality: N/A;    CORONARY STENT INTERVENTION N/A 06/25/2018   Procedure: CORONARY STENT INTERVENTION;  Surgeon: Swaziland, Peter M, MD;  Location: [facility] INVASIVE CV LAB;  Service: Cardiovascular;  Laterality: N/A;   CORONARY STENT INTERVENTION N/A 08/12/2019   Procedure: CORONARY STENT INTERVENTION;  Surgeon: Claudene Victory ORN, MD;  Location: [facility] INVASIVE CV LAB;  Service: Cardiovascular;  Laterality: N/A;   CORONARY STENT INTERVENTION N/A 08/16/2019   Procedure: CORONARY STENT INTERVENTION;  Surgeon: Swaziland, Peter M, MD;  Location: [facility] INVASIVE CV LAB;  Service: Cardiovascular;  Laterality: N/A;   DOPPLER ECHOCARDIOGRAPHY  04/12/2008   EF 60%   GASTRIC BYPASS  2009   HERNIA REPAIR     LEFT HEART CATH AND CORONARY ANGIOGRAPHY N/A 05/06/2017   Procedure: LEFT HEART CATH AND CORONARY ANGIOGRAPHY;  Surgeon: Verlin Lonni BIRCH, MD;  Location: [facility] INVASIVE CV LAB;  Service: Cardiovascular;  Laterality: N/A;   LEFT HEART CATH AND CORS/GRAFTS ANGIOGRAPHY N/A 09/26/2016   Procedure: Left Heart Cath and Cors/Grafts Angiography;  Surgeon: Anner Alm ORN, MD;  Location: [facility] INVASIVE CV LAB;  Service: Cardiovascular;  Laterality: N/A;   LEFT HEART CATH AND CORS/GRAFTS ANGIOGRAPHY N/A 06/25/2018   Procedure: LEFT HEART CATH AND CORS/GRAFTS ANGIOGRAPHY;  Surgeon: Swaziland, Peter M, MD;  Location: [facility] INVASIVE CV LAB;  Service: Cardiovascular;  Laterality: N/A;   LEFT HEART CATH AND CORS/GRAFTS ANGIOGRAPHY N/A 08/12/2019   Procedure: LEFT HEART CATH AND CORS/GRAFTS ANGIOGRAPHY;  Surgeon: Claudene Victory ORN, MD;  Location: [facility] INVASIVE CV LAB;  Service: Cardiovascular;  Laterality: N/A;   LEFT HEART CATH AND CORS/GRAFTS ANGIOGRAPHY N/A 03/08/2020   Procedure: LEFT HEART CATH AND CORS/GRAFTS ANGIOGRAPHY;  Surgeon: Swaziland, Peter M, MD;  Location: [facility] INVASIVE CV LAB;  Service: Cardiovascular;  Laterality: N/A;   NM MYOVIEW  LTD  06/03/2012   EF 71%   POSTERIOR FUSION LUMBAR SPINE  1990s   TOTAL KNEE ARTHROPLASTY Left 02/26/2021   Procedure: TOTAL KNEE  ARTHROPLASTY;  Surgeon: Ernie Cough, MD;  Location: [facility] ORS;  Service: Orthopedics;  Laterality: Left;   TOTAL KNEE ARTHROPLASTY Right 08/13/2021   Procedure: TOTAL KNEE ARTHROPLASTY;  Surgeon: Ernie Cough, MD;  Location: [facility];  Service: Orthopedics;  Laterality: Right;   TUBAL LIGATION     VAGINAL HYSTERECTOMY     partial    Social History   Socioeconomic History   Marital status: Married    Spouse name: Not on file   Number of children: Not on file   Years of education: Not on file   Highest education level: Not on file  Occupational History   Not on file  Tobacco Use   Smoking status: Never   Smokeless tobacco: Never  Vaping Use   Vaping status: Never Used  Substance and Sexual Activity   Alcohol use: No   Drug use: No   Sexual activity: Not on file  Other Topics Concern   Not on file  Social History Narrative   Not on file   Social Drivers of Health   Financial Resource Strain: Not on file  Food Insecurity: No Food Insecurity (01/23/2023)   Hunger Vital Sign    Worried About Running Out of Food in the Last Year: Never true    Ran Out of Food in the Last Year: Never true  Transportation Needs: No Transportation Needs (01/23/2023)   PRAPARE - Administrator, Civil Service (Medical): No    Lack of Transportation (Non-Medical): No  Physical Activity: Not on file  Stress: Not on file  Social Connections: Not on file    Family History  Problem Relation Age of Onset   Heart disease Mother    Cancer - Lung Father     Outpatient Encounter Medications as of 10/13/2023  Medication Sig   Accu-Chek Softclix Lancets lancets USE TO TEST BLOOD GLUCOSE TWICE  DAILY. E11.65   acetaminophen  (TYLENOL ) 500 MG tablet Take 2 tablets (1,000 mg total) by mouth every 6 (six) hours.   albuterol  (VENTOLIN  HFA) 108 (90 Base) MCG/ACT inhaler Inhale 1 puff into the lungs every 6 (six) hours as needed for wheezing. (Patient not taking: Reported on 09/10/2023)   amLODipine   (NORVASC ) 2.5 MG tablet Take 1 tablet (2.5 mg total) by mouth daily.   atorvastatin  (LIPITOR) 40 MG tablet Take 1 tablet (40 mg total) by mouth daily.   Blood Glucose Monitoring Suppl (ACCU-CHEK GUIDE ME) w/Device KIT Use to test blood glucose twice daily as directed.   BRILINTA  60 MG TABS tablet Take 1 tablet (60 mg total) by mouth 2 (two) times daily.  cetirizine (ZYRTEC) 10 MG tablet Take 10 mg by mouth daily.   Cholecalciferol  50 MCG (2000 UT) CAPS Take 1 capsule (2,000 Units total) by mouth daily with breakfast.   Coenzyme Q10 (CO Q-10) 200 MG CAPS Take 200 mg by mouth daily.   Cyanocobalamin  (VITAMIN B-12 PO) Take 3 tablets by mouth daily.   cyclobenzaprine  (FLEXERIL ) 10 MG tablet Take 1 tablet (10 mg total) by mouth 3 (three) times daily as needed for muscle spasms.   diclofenac  (VOLTAREN ) 75 MG EC tablet Take 75 mg by mouth 2 (two) times daily as needed.   diclofenac  sodium (VOLTAREN ) 1 % GEL Apply 1 application. topically in the morning and at bedtime.   dicyclomine  (BENTYL ) 10 MG capsule Take 10 mg by mouth every 6 (six) hours as needed for spasms.   Docusate Calcium  (STOOL SOFTENER PO) Take 1 capsule by mouth daily.   EPINEPHrine  0.3 mg/0.3 mL IJ SOAJ injection Inject 0.3 mg into the muscle as needed for anaphylaxis. (Patient not taking: Reported on 09/10/2023)   Fingerstix Lancets MISC Inject into the skin.   fluticasone  (FLONASE ) 50 MCG/ACT nasal spray Place 1 spray into both nostrils 2 (two) times daily.   furosemide  (LASIX ) 40 MG tablet Take 40 mg by mouth daily as needed for fluid or edema.   gabapentin  (NEURONTIN ) 600 MG tablet Take 600 mg by mouth in the morning, at noon, in the evening, and at bedtime.   glucose blood (ACCU-CHEK GUIDE TEST) test strip 1 each by Other route 2 (two) times daily. Use as instructed bid. E11.65   glucose blood (ACCU-CHEK GUIDE) test strip USE TO CHECK BLOOD GLUCOSE DAILY AS DIRECTED   glucose blood (ACCU-CHEK GUIDE) test strip for testing as directed  Dx E11.65   glucose blood (ACCU-CHEK GUIDE) test strip 1 each by Other route in the morning and at bedtime. Test BG bid. Dx E11.65   Iron-Vitamin C (VITRON-C PO) Take 1 tablet by mouth daily.   isosorbide  mononitrate (IMDUR ) 60 MG 24 hr tablet Take 1 tablet (60 mg total) by mouth daily.   lidocaine  (LIDODERM ) 5 % Place 1 patch onto the skin daily as needed. Remove & Discard patch within 12 hours or as directed by MD   meloxicam (MOBIC) 15 MG tablet Take 15 mg by mouth daily.   methocarbamol  (ROBAXIN ) 500 MG tablet Take 1 tablet (500 mg total) by mouth every 8 (eight) hours as needed for muscle spasms.   metoprolol  tartrate (LOPRESSOR ) 50 MG tablet Take 1 tablet (50 mg total) by mouth 2 (two) times daily.   Multiple Vitamin (MULTIVITAMIN WITH MINERALS) TABS tablet Take 2 tablets by mouth daily.   nitroGLYCERIN  (NITROLINGUAL ) 0.4 MG/SPRAY spray Place 1 spray under the tongue every 5 (five) minutes x 3 doses as needed for chest pain. (Patient not taking: Reported on 09/10/2023)   oxyCODONE -acetaminophen  (PERCOCET/ROXICET) 5-325 MG tablet Take 1-2 tablets by mouth every 6 (six) hours as needed for severe pain (pain score 7-10).   pantoprazole  (PROTONIX ) 40 MG tablet Take 40 mg by mouth 2 (two) times daily as needed (acid reflux).   pantoprazole  (PROTONIX ) 40 MG tablet Take 40 mg by mouth daily.   polyethylene glycol (MIRALAX  / GLYCOLAX ) 17 g packet Take 17 g by mouth daily as needed for mild constipation.   ranolazine  (RANEXA ) 1000 MG SR tablet Take 1 tablet (1,000 mg total) by mouth 2 (two) times daily.   senna (SENOKOT) 8.6 MG tablet Take 1 tablet by mouth daily.   tamsulosin (FLOMAX) 0.4  MG CAPS capsule Take 0.4 mg by mouth daily.   traMADol  (ULTRAM ) 50 MG tablet Take 50 mg by mouth every 6 (six) hours as needed. for pain   vitamin E  400 UNIT capsule Take 400 Units by mouth daily.   [DISCONTINUED] Cholecalciferol  50 MCG (2000 UT) CAPS Take 1 capsule (2,000 Units total) by mouth daily with breakfast.  (Patient not taking: Reported on 09/10/2023)   [DISCONTINUED] Vitamin D , Ergocalciferol , (DRISDOL ) 1.25 MG (50000 UNIT) CAPS capsule Take by mouth 2 (two) times a week.   Facility-Administered Encounter Medications as of 10/13/2023  Medication   sodium chloride  flush (NS) 0.9 % injection 3 mL    ALLERGIES: Allergies  Allergen Reactions   Codeine Hives and Shortness Of Breath        Other Anaphylaxis    Nuts   Ciprofloxacin Itching   Valsartan Rash   Adhesive [Tape] Itching    clear tape used after surgery.took all her skin off.  Paper take is ok   Isosorbide  Nitrate Other (See Comments)   Metformin  Other (See Comments)   Sulfa Antibiotics Nausea Only    VACCINATION STATUS: Immunization History  Administered Date(s) Administered   Moderna Sars-Covid-2 Vaccination 04/23/2019, 05/21/2019, 12/30/2019, 07/07/2020    Diabetes She presents for her follow-up diabetic visit. She has type 2 diabetes mellitus. Onset time: She was diagnosed at approximate age of 34 years. Her disease course has been improving. There are no hypoglycemic associated symptoms. Pertinent negatives for hypoglycemia include no confusion, headaches, pallor or seizures. Pertinent negatives for diabetes include no chest pain, no fatigue, no polydipsia, no polyphagia and no polyuria. There are no hypoglycemic complications. Symptoms are improving. Diabetic complications include heart disease. Risk factors for coronary artery disease include dyslipidemia, diabetes mellitus, hypertension, sedentary lifestyle, post-menopausal, obesity and family history. When asked about current treatments, none were reported. Her weight is decreasing steadily. She is following a generally unhealthy diet. When asked about meal planning, she reported none. She has not had a previous visit with a dietitian. She never participates in exercise. Her home blood glucose trend is fluctuating minimally. Her breakfast blood glucose range is generally  110-130 mg/dl. Her bedtime blood glucose range is generally 110-130 mg/dl. Her overall blood glucose range is 110-130 mg/dl. (She presents with her meter showing average blood glucose of 120 and previsit A1c of 5.5%.  She denies hypoglycemia.  ) An ACE inhibitor/angiotensin II receptor blocker is being taken. Eye exam is current.  Hyperlipidemia This is a chronic problem. The current episode started more than 1 year ago. The problem is controlled. Exacerbating diseases include diabetes. Pertinent negatives include no chest pain, myalgias or shortness of breath. Risk factors for coronary artery disease include dyslipidemia, diabetes mellitus, hypertension, a sedentary lifestyle and post-menopausal.  Hypertension This is a chronic problem. The problem is controlled. Pertinent negatives include no chest pain, headaches, palpitations or shortness of breath. Risk factors for coronary artery disease include dyslipidemia, diabetes mellitus, sedentary lifestyle, family history and post-menopausal state. Past treatments include ACE inhibitors. Hypertensive end-organ damage includes CAD/MI.    Review of systems: Limited as above.  Objective:    BP 134/68   Pulse 64   Ht 5' 4 (1.626 m)   Wt 165 lb 3.2 oz (74.9 kg)   BMI 28.36 kg/m   Wt Readings from Last 3 Encounters:  10/13/23 165 lb 3.2 oz (74.9 kg)  09/10/23 167 lb (75.8 kg)  02/27/23 174 lb 9.6 oz (79.2 kg)     Physical Exam- Limited  Constitutional:  Body mass index is 28.36 kg/m. , not in acute distress, normal state of mind   CMP     Component Value Date/Time   NA 141 10/07/2023 0824   K 4.7 10/07/2023 0824   CL 105 10/07/2023 0824   CO2 20 10/07/2023 0824   GLUCOSE 85 10/07/2023 0824   GLUCOSE 116 (H) 12/27/2022 1425   BUN 20 10/07/2023 0824   CREATININE 0.89 10/07/2023 0824   CREATININE 0.94 10/18/2018 1000   CALCIUM  9.0 10/07/2023 0824   PROT 7.0 10/07/2023 0824   ALBUMIN  4.1 10/07/2023 0824   AST 36 10/07/2023 0824    ALT 41 (H) 10/07/2023 0824   ALKPHOS 152 (H) 10/07/2023 0824   BILITOT 0.4 10/07/2023 0824   GFRNONAA >60 12/27/2022 1425   GFRNONAA 64 10/18/2018 1000   GFRAA 82 03/05/2020 1207   GFRAA 74 10/18/2018 1000    Diabetic Labs (most recent): Lab Results  Component Value Date   HGBA1C 5.8 (H) 01/06/2023   HGBA1C 5.3 10/13/2022   HGBA1C 5.7 04/22/2022   MICROALBUR 1.04 04/27/2019     Lipid Panel ( most recent) Lipid Panel     Component Value Date/Time   CHOL 137 10/07/2023 0824   TRIG 51 10/07/2023 0824   HDL 81 10/07/2023 0824   CHOLHDL 1.7 10/07/2023 0824   CHOLHDL 1.8 04/15/2010 0400   VLDL 3 04/15/2010 0400   LDLCALC 45 10/07/2023 0824      Lab Results  Component Value Date   TSH 1.520 10/07/2023   TSH 0.698 04/10/2022   TSH 1.440 01/14/2021   TSH 0.80 01/11/2020   TSH 1.16 04/27/2019   TSH 1.47 05/12/2018   TSH 1.540 01/01/2018   TSH 1.268 04/14/2010   FREET4 1.15 10/07/2023   FREET4 1.33 04/10/2022   FREET4 1.15 01/14/2021      Assessment & Plan:   1. DM type 2 causing vascular disease (HCC)   - Tamara Nunez has currently controlled  type 2 DM since  68 years of age.  She presents with her meter showing average blood glucose of 120 and previsit A1c of 5.5%.  She denies hypoglycemia.   -her diabetes is complicated by coronary artery disease, sedentary life, and she remains at a high risk for more acute and chronic complications which include CAD, CVA, CKD, retinopathy, and neuropathy. These are all discussed in detail with her.  - I have counseled her on diet management and weight loss, by adopting a carbohydrate restricted/protein rich diet.  She is engaged and remains a beneficiary of lifestyle medicine.  - Suggestion is made for her to avoid simple carbohydrates  from her diet including Cakes, Sweet Desserts, Ice Cream, Soda (diet and regular), Sweet Tea, Candies, Chips, Cookies, Store Bought Juices, Alcohol , Artificial Sweeteners,  Coffee Creamer,  and Sugar-free Products, Lemonade. This will help patient to have more stable blood glucose profile and potentially avoid unintended weight gain. - I have approached her with the following individualized plan to manage  her diabetes and patient agrees:   -In light of her presentation was tightly controlled glycemic profile to near target levels, she will not need intervention with medications at this time.  - Her average blood glucose is 120 mg per DL despite her mild- moderate anemia, with current hemoglobin of 10.3 and hematocrit 33.6.  She is willing to continue monitoring blood glucose twice a day and encouraged to do so.  She will be testing before breakfast and at bedtime and report if  readings are less than 70 or greater than 200 mg per DL.  She was previously treated with Starlix which was discontinued due to random, mild hypoglycemia.  She has several options to manage diabetes if it becomes necessary.  -Patient would like to avoid any further weight loss. - Patient specific target  A1c;  LDL, HDL, Triglycerides, were discussed in detail.  2) Blood Pressure /Hypertension: -Her blood pressure is controlled to target. -   she is advised to continue her current medications including  Ramipril  5 mg p.o. daily with breakfast .  3) Lipids/Hyperlipidemia:   Review of her recent lipid panel showed controlled LDL at 55.  She is advised to continue Lipitor 40 mg p.o. nightly.    Side effects and precautions discussed with her.    4)  Weight/Diet: Her BMI is 28.36--she is not a candidate for major weight loss.  She wishes to avoid any further weight loss.  CDE Consult will be initiated . Exercise, and detailed carbohydrates information provided  -  detailed on discharge instructions.  5) vitamin D  deficiency: - Therapy that she has stopped all vitamin D  supplements.  She is status posttreatment with vitamin D  to 50,000 units weekly for 12 weeks.  I discussed and advised her to maintain  vitamin D3 2000 units daily.  6) Chronic Care/Health Maintenance:  -she  is on ACEI/ARB and Statin medications and  is encouraged to initiate and continue to follow up with Ophthalmology, Dentist,  Podiatrist at least yearly or according to recommendations, and advised to   stay away from smoking. I have recommended yearly flu vaccine and pneumonia vaccine at least every 5 years; moderate intensity exercise for up to 150 minutes weekly; and  sleep for at least 7 hours a day.  - she is  advised to maintain close follow up with Ezzard Elsie NOVAK, MD for primary care needs, as well as her other providersfor optimal and coordinated care.   I spent  25  minutes in the care of the patient today including review of labs from CMP, Lipids, Thyroid Function, Hematology (current and previous including abstractions from other facilities); face-to-face time discussing  her blood glucose readings/logs, discussing hypoglycemia and hyperglycemia episodes and symptoms, medications doses, her options of short and long term treatment based on the latest standards of care / guidelines;  discussion about incorporating lifestyle medicine;  and documenting the encounter. Risk reduction counseling performed per USPSTF guidelines to reduce cardiovascular risk factors.     Please refer to Patient Instructions for Blood Glucose Monitoring and Insulin /Medications Dosing Guide  in media tab for additional information. Please  also refer to  Patient Self Inventory in the Media  tab for reviewed elements of pertinent patient history.  Tamara Nunez participated in the discussions, expressed understanding, and voiced agreement with the above plans.  All questions were answered to her satisfaction. she is encouraged to contact clinic should she have any questions or concerns prior to her return visit.   Follow up plan: - Return in about 6 months (around 04/14/2024) for Bring Meter/CGM Device/Logs- A1c in Office.  Ranny Earl,  MD [facility] [address] [place], [place] [address] Phone: [phone]  Fax: [phone]    10/13/2023, 4:53 PM  This note was partially dictated with voice recognition software. Similar sounding words can be transcribed inadequately or may not  be corrected upon review.

## 2023-10-13 NOTE — Patient Instructions (Signed)

## 2024-01-28 ENCOUNTER — Telehealth: Payer: Self-pay | Admitting: Internal Medicine

## 2024-01-28 MED ORDER — ISOSORBIDE MONONITRATE ER 60 MG PO TB24: 60.0000 mg | ORAL_TABLET | Freq: Every day | ORAL | 2 refills | Status: DC

## 2024-01-28 NOTE — Telephone Encounter (Signed)
*  STAT* If patient is at the pharmacy, call can be transferred to refill team.   1. Which medications need to be refilled? (please list name of each medication and dose if known)   isosorbide  mononitrate (IMDUR ) 60 MG 24 hr tablet     2. Would you like to learn more about the convenience, safety, & potential cost savings by using the [facility]? No    3. Are you open to using the [facility] (Type [facility].  ). No    4. Which pharmacy/location (including street and city if local pharmacy) is medication to be sent to?[id number] - [place], [place] - [address]    5. Do they need a 30 day or 90 day supply? 30 day   Pt is out of medication and needs a new Rx

## 2024-01-28 NOTE — Telephone Encounter (Signed)
Refill sent,

## 2024-02-19 ENCOUNTER — Telehealth: Payer: Self-pay | Admitting: Internal Medicine

## 2024-02-19 NOTE — Telephone Encounter (Signed)
*  STAT* If patient is at the pharmacy, call can be transferred to refill team.   1. Which medications need to be refilled? (please list name of each medication and dose if known)   isosorbide mononitrate (IMDUR) 60 MG 24 hr tablet    2. Which pharmacy/location (including street and city if local pharmacy) is medication to be sent to? [facility] - [place], [place] - [address] [facility] AT Portal to Registered [facility] Sites   3. Do they need a 30 day or 90 day supply? 90

## 2024-02-22 MED ORDER — ISOSORBIDE MONONITRATE ER 60 MG PO TB24: 60.0000 mg | ORAL_TABLET | Freq: Every day | ORAL | 2 refills | Status: AC

## 2024-02-22 NOTE — Telephone Encounter (Signed)
 Refill sent

## 2024-03-30 ENCOUNTER — Telehealth: Payer: Self-pay | Admitting: Internal Medicine

## 2024-03-30 NOTE — Telephone Encounter (Signed)
 Pt asking for c/b from doctor about her anxiety in her chest. Pt did not give any more details. Please advise.

## 2024-03-30 NOTE — Telephone Encounter (Signed)
 Spoke to patient she stated she has been having chest pain off and on for the past 1 month.Stated pain radiates down left arm.She gets sob.She takes NTG with relief.No pain at present.She requested appointment with Scot Ford PA.Appointment scheduled with Hao 2/6 at 2:45 pm.Appointment offered sooner but she wants to see only Scot Ford PA.Advised if she has any more chest pain she needs to go to ED to be evaluated.I will make Dr.Hilty aware.

## 2024-04-05 ENCOUNTER — Telehealth: Payer: Self-pay | Admitting: "Endocrinology

## 2024-04-05 NOTE — Telephone Encounter (Signed)
 Disposition states labs, if pt does need labs can you put order in?

## 2024-04-14 ENCOUNTER — Ambulatory Visit: Admitting: "Endocrinology

## 2024-04-14 ENCOUNTER — Ambulatory Visit: Admitting: Physician Assistant

## 2024-04-15 ENCOUNTER — Emergency Department (HOSPITAL_COMMUNITY): Admission: EM | Admit: 2024-04-15 | Source: Home / Self Care

## 2024-04-15 ENCOUNTER — Ambulatory Visit: Admitting: Physician Assistant

## 2024-04-15 ENCOUNTER — Encounter: Payer: Self-pay | Admitting: Physician Assistant

## 2024-04-15 ENCOUNTER — Other Ambulatory Visit: Payer: Self-pay

## 2024-04-15 ENCOUNTER — Encounter (HOSPITAL_COMMUNITY): Payer: Self-pay | Admitting: *Deleted

## 2024-04-15 ENCOUNTER — Emergency Department (HOSPITAL_COMMUNITY)

## 2024-04-15 VITALS — BP 152/82 | HR 63 | Ht 64.0 in | Wt 170.8 lb

## 2024-04-15 DIAGNOSIS — I10 Essential (primary) hypertension: Secondary | ICD-10-CM

## 2024-04-15 DIAGNOSIS — I25700 Atherosclerosis of coronary artery bypass graft(s), unspecified, with unstable angina pectoris: Secondary | ICD-10-CM

## 2024-04-15 DIAGNOSIS — R072 Precordial pain: Secondary | ICD-10-CM

## 2024-04-15 DIAGNOSIS — E119 Type 2 diabetes mellitus without complications: Secondary | ICD-10-CM

## 2024-04-15 DIAGNOSIS — I2510 Atherosclerotic heart disease of native coronary artery without angina pectoris: Secondary | ICD-10-CM

## 2024-04-15 DIAGNOSIS — E782 Mixed hyperlipidemia: Secondary | ICD-10-CM

## 2024-04-15 LAB — CBC
HCT: 40.1 % (ref 36.0–46.0)
Hemoglobin: 13.3 g/dL (ref 12.0–15.0)
MCH: 31 pg (ref 26.0–34.0)
MCHC: 33.2 g/dL (ref 30.0–36.0)
MCV: 93.5 fL (ref 80.0–100.0)
Platelets: 236 10*3/uL (ref 150–400)
RBC: 4.29 MIL/uL (ref 3.87–5.11)
RDW: 13.3 % (ref 11.5–15.5)
WBC: 5.3 10*3/uL (ref 4.0–10.5)
nRBC: 0 % (ref 0.0–0.2)

## 2024-04-15 LAB — BASIC METABOLIC PANEL WITH GFR
Anion gap: 11 (ref 5–15)
BUN: 11 mg/dL (ref 8–23)
CO2: 22 mmol/L (ref 22–32)
Calcium: 8.9 mg/dL (ref 8.9–10.3)
Chloride: 107 mmol/L (ref 98–111)
Creatinine, Ser: 0.88 mg/dL (ref 0.44–1.00)
GFR, Estimated: >60 mL/min
Glucose, Bld: 82 mg/dL (ref 70–99)
Potassium: 4.1 mmol/L (ref 3.5–5.1)
Sodium: 140 mmol/L (ref 135–145)

## 2024-04-15 LAB — PRO BRAIN NATRIURETIC PEPTIDE: Pro Brain Natriuretic Peptide: 238 pg/mL

## 2024-04-15 LAB — TROPONIN T, HIGH SENSITIVITY
Troponin T High Sensitivity: 23 ng/L — ABNORMAL HIGH (ref 0–19)
Troponin T High Sensitivity: 22 ng/L — ABNORMAL HIGH (ref 0–19)

## 2024-04-15 LAB — TSH: TSH: 1.33 u[IU]/mL (ref 0.350–4.500)

## 2024-04-15 MED ORDER — NITROGLYCERIN 0.4 MG/SPRAY TL SOLN: 1.0000 | 2 refills | Status: AC | PRN

## 2024-04-15 MED ORDER — NITROGLYCERIN 0.4 MG SL SUBL: 0.4000 mg | SUBLINGUAL_TABLET | SUBLINGUAL | 3 refills | Status: AC | PRN

## 2024-04-15 NOTE — ED Triage Notes (Signed)
"   The pt has chf and has multiple stents in her heart she is on a blood thinner "

## 2024-04-15 NOTE — ED Provider Triage Note (Cosign Needed)
 Emergency Medicine Provider Triage Evaluation Note  Tamara Nunez , a 69 y.o. female  was evaluated in triage.  Pt complains of chest pain.  Patient was sent over by Dr. For a further workup.  Patient reports that she has chest pain and pain radiating down her left arm.  Her chest pain has been going on and off for about a week however over the past 24 hours she has noted the pain to get worse.  She denies any shortness of breath.  She reports that she has had pain like this before but it has been a while.  On physical exam lung sounds and heart sounds were clear bilaterally.  Patient denied any other symptoms.  Patient was stable in triage.  She was well-appearing and in no acute distress  Review of Systems  Positive: Chest pain Negative:   Physical Exam  BP (!) 147/80 (BP Location: Left Arm)   Pulse 67   Temp (!) 97.5 F (36.4 C)   Resp 16   Ht 5' 4 (1.626 m)   Wt 77.5 kg   SpO2 100%   BMI 29.33 kg/m  Gen:   Awake, no distress   Resp:  Normal effort  MSK:   Moves extremities without difficulty  Other:    Medical Decision Making  Medically screening exam initiated at 4:24 PM.  Appropriate orders placed.  Tamara Nunez was informed that the remainder of the evaluation will be completed by another provider, this initial triage assessment does not replace that evaluation, and the importance of remaining in the ED until their evaluation is complete.     Rosaline Almarie MATSU, [place] 04/15/24 [phone]

## 2024-04-15 NOTE — ED Triage Notes (Signed)
 Patient arrives POV for chest pain that is left sided and radiating down left arm. Patient was sent from cardiology today. Patient states pain is intermittent in nature but becoming more frequent.

## 2024-04-15 NOTE — Progress Notes (Signed)
 " Cardiology Office Note   Date:  04/15/2024  ID:  Vaeda, Westall [birth year], MRN [id number] PCP: Ezzard Elsie NOVAK, MD  [facility] Providers Cardiologist:  Vinie JAYSON Maxcy, MD     History of Present Illness DELORESE SELLIN is a 69 y.o. female with a hx of CAD s/p CABG 2006 with LIMA to LAD, SVG to distal LAD, hypertension, hyperlipidemia, DM II, GERD, and fibromyalgia.  Cardiac catheterization in 2009 showed widely patent grafts.  Myoview  in March 2014 was low risk. Patient underwent repeat cardiac catheterization in July 2018 that demonstrated 80% proximal RCA lesion that was treated with DES, she also had atretic LIMA to LAD with 100% occlusion of mid LAD, patent SVG to distal LAD, EF 55 to 60%.  Unfortunately despite intervention, she continued to have intermittent chest discomfort.  Cardiac catheterization performed in April 2020 demonstrated a single obstructive vessel with chronic total occlusion of mid LAD that was bypassed by SVG graft.  New 90% proximal D1 that was felt to be culprit lesion, this was treated with a drug-eluting stent.  She again underwent cardiac catheterization by Dr. Jordan in June 2021 that demonstrated 75% distal RCA lesion that was treated with drug-eluting stent.  Her chest pain recurred.  We eventually recommended relook cath on 03/08/2020 which showed patent stents, atretic LIMA to LAD, patent SVG to LAD, no new stenosis to explain her overall symptom.  Medical therapy was recommended.  Lower extremity Doppler performed in December 2021 revealed noncompressible ABI bilaterally with tibial vessel disease but no large vessel disease. It is recommended she continue dual antiplatelet therapy lifelong.  She was placed on Pletal  for lower extremity claudication symptoms. I stopped Pletal  in Apr 2024 to reduce bruising.  Given intermittent chest discomfort, I was hesitant to stop aspirin  or Brilinta .  I increased her Imdur  to 60 mg daily and recommended outpatient PET  stress test.  PET stress test obtained on 08/06/2022 showed EF 70%, overall low risk study with no ischemia or infarction.   Patient was last seen by Dr. Maxcy in July 2025 at which time she has been doing well.  She underwent a successful back surgery had some postoperative anemia.  Patient presents today for evaluation of worsening chest pain radiating down the left arm.  Symptom is reminiscent of the previous angina.  Symptom has been going on since mid January and has been intermittent for the past 3 weeks.  It is getting worse, lasting longer and becoming more frequent.  She is euvolemic on exam.  Initial plan is to consider outpatient cardiac catheterization after discussing with DOD Dr. Jordan, however when I discussed the plan with the patient, she had worsening left arm pain.  Plan will be changed, patient will be sent to the emergency room.  I recommended transporting her to the ED via EMS, patient refused and wished to be transported by private vehicle.  I will inform our hospital team.  Patient likely will need to be admitted and consider cardiac catheterization.   ROS:   Patient complains of worsening intermittent left-sided chest pain radiating down the left arm.  She denies any significant shortness of breath.  She has no lower extremity edema, orthopnea or PND  Studies Reviewed      Cardiac Studies & Procedures   ______________________________________________________________________________________________ CARDIAC CATHETERIZATION  CARDIAC CATHETERIZATION 03/08/2020  Conclusion  Non-stenotic Ost 1st Diag-2 lesion was previously treated.  Non-stenotic Ost 1st Diag-1 lesion was previously treated.  Prox LAD to Mid  LAD lesion is 100% stenosed.  Prox LAD-2 lesion is 100% stenosed.  Non-stenotic Prox LAD-1 lesion was previously treated.  Ost 2nd Mrg lesion is 70% stenosed.  Prox Cx lesion is 40% stenosed.  Mid Cx lesion is 20% stenosed.  Non-stenotic Ost Cx lesion was  previously treated.  Prox RCA lesion is 10% stenosed.  Prox RCA to Mid RCA lesion is 30% stenosed.  LIMA.  Origin to Mid Graft lesion is 99% stenosed.  SVG and is normal in caliber.  Mid RCA to Dist RCA lesion is 35% stenosed.  The left ventricular systolic function is normal.  LV end diastolic pressure is normal.  The left ventricular ejection fraction is 55-65% by visual estimate.  1. CAD with continued patency of prior stents. No new stenoses to explain her recent symptoms. 2. Patent SVG to LAD 3. Atretic LIMA to the LAD which is supplied by the SVG 4. Normal LV function 5. Normal LVEDP  Plan: continue medical therapy.  Findings Coronary Findings Diagnostic  Dominance: Right  Left Anterior Descending Vessel is large. Non-stenotic Prox LAD-1 lesion was previously treated. Prox LAD-2 lesion is 100% stenosed. Prox LAD to Mid LAD lesion is 100% stenosed. The lesion is chronically occluded.  First Diagonal Branch Vessel is moderate in size. Non-stenotic Ost 1st Diag-1 lesion was previously treated. Non-stenotic Ost 1st Diag-2 lesion was previously treated.  Second Diagonal Branch Vessel is small in size.  Third Diagonal Branch Vessel is small in size.  Left Circumflex Non-stenotic Ost Cx lesion was previously treated. Prox Cx lesion is 40% stenosed. Mid Cx lesion is 20% stenosed.  First Obtuse Marginal Branch Vessel is small in size.  Second Obtuse Marginal Branch Vessel is small in size. Ost 2nd Mrg lesion is 70% stenosed.  Third Obtuse Marginal Branch Vessel is large in size.  Right Coronary Artery Vessel is moderate in size. Prox RCA lesion is 10% stenosed. The lesion was previously treated using a drug eluting stent over 2 years ago. Prox RCA to Mid RCA lesion is 30% stenosed. The lesion is calcified. Mid RCA to Dist RCA lesion is 35% stenosed. The lesion is severely calcified. The lesion was previously treated using a drug eluting stent between  6-12 months ago.  LIMA LIMA Graft To Mid LAD LIMA. The graft is atretic. Origin to Mid Graft lesion is 99% stenosed.  Saphenous Graft To Mid LAD SVG and is normal in caliber.  Intervention  No interventions have been documented.   CARDIAC CATHETERIZATION  CARDIAC CATHETERIZATION 08/16/2019  Conclusion  Mid RCA to Dist RCA lesion is 75% stenosed.  Post intervention, there is a 0% residual stenosis.  A drug-eluting stent was successfully placed using a STENT RESOLUTE ONYX 2.5X34.  1. Successful PCI of the mid RCA using orbital atherectomy and DES x 1  Plan: DAPT for at least one year. Anticipate DC tomorrow.  Findings Coronary Findings Diagnostic  Dominance: Right  Left Anterior Descending Vessel is large. Previously placed Prox LAD-1 stent (unknown type) is widely patent. Prox LAD-2 lesion is 100% stenosed. Prox LAD to Mid LAD lesion is 100% stenosed. The lesion is chronically occluded.  First Diagonal Branch Vessel is moderate in size. Previously placed Ost 1st Diag-1 stent (unknown type) is widely patent. Non-stenotic Ost 1st Diag-2 lesion was previously treated.  Second Diagonal Branch Vessel is small in size.  Third Diagonal Branch Vessel is small in size.  Left Circumflex Non-stenotic Ost Cx lesion was previously treated. Prox Cx lesion is 40% stenosed. Mid Cx  lesion is 20% stenosed.  First Obtuse Marginal Branch Vessel is small in size.  Second Obtuse Marginal Branch Vessel is small in size. Ost 2nd Mrg lesion is 70% stenosed.  Third Obtuse Marginal Branch Vessel is large in size.  Right Coronary Artery Vessel is moderate in size. Prox RCA lesion is 10% stenosed. The lesion was previously treated using a drug eluting stent between 6-12 months ago. Prox RCA to Mid RCA lesion is 30% stenosed. The lesion is calcified. Mid RCA to Dist RCA lesion is 75% stenosed.  LIMA LIMA Graft To Mid LAD LIMA. The graft is atretic. Origin to Mid Graft lesion  is 99% stenosed.  Saphenous Graft To Mid LAD SVG and is normal in caliber.  Intervention  Mid RCA to Dist RCA lesion Stent CATHETER LAUNCHER 6FR AL1 guide catheter was inserted. Lesion crossed with guidewire using a WIRE ASAHI PROWATER 180CM. Pre-stent angioplasty was performed using a BALLOON SAPPHIRE 2.25X15. A drug-eluting stent was successfully placed using a STENT RESOLUTE ONYX 2.5X34. Stent strut is well apposed. Stent does not overlap previously placed stentPost-stent angioplasty was performed using a BALLOON SAPPHIRE Sharpes 3.0X18. Maximum pressure:  18 atm. Atherectomy CATHETER LAUNCHER 6FR AL1 guide catheter was inserted. WIRE VIPERWIRE COR FLEX TIP guidewire was used to cross lesion. Orbital atherectomy was performed using a CROWN DIAMONDBACK CLASSIC 1.25. 4 passes taken. Post-Intervention Lesion Assessment The intervention was successful. Pre-interventional TIMI flow is 3. Post-intervention TIMI flow is 3. No complications occurred at this lesion. There is a 0% residual stenosis post intervention.   STRESS TESTS  NM PET CT CARDIAC PERFUSION MULTI W/ABSOLUTE BLOODFLOW 08/06/2022  Narrative   The study is normal. The study is low risk.   LV perfusion is normal. There is no evidence of ischemia. There is no evidence of infarction.   Rest left ventricular function is normal. Rest EF: 70 %. Stress left ventricular function is normal. Stress EF: 70 %. End diastolic cavity size is normal. End systolic cavity size is normal. No evidence of transient ischemic dilation (TID) noted.   Myocardial blood flow was computed to be 0.47ml/g/min at rest and 1.92ml/g/min at stress. Global myocardial blood flow reserve was 2.29 and was normal.   Coronary calcium  assessment not performed due to prior revascularization. Aortic atherosclersosis seen.   Electronically signed by: Soyla DELENA Merck, MD  CLINICAL DATA:  This over-read does not include interpretation of cardiac or coronary anatomy or  pathology. The cardiac PET-CT interpretation by the cardiologist is attached.  COMPARISON:  None Available.  FINDINGS: Atherosclerotic calcifications in the thoracic aorta. Status post median sternotomy for CABG. Within the visualized portions of the thorax there are no suspicious appearing pulmonary nodules or masses, there is no acute consolidative airspace disease, no pleural effusions, no pneumothorax and no lymphadenopathy. Visualized portions of the upper abdomen are unremarkable. There are no aggressive appearing lytic or blastic lesions noted in the visualized portions of the skeleton.  IMPRESSION: 1.  Aortic Atherosclerosis (ICD10-I70.0).   Electronically Signed By: Toribio Aye M.D. On: 08/06/2022 08:59            ______________________________________________________________________________________________      Risk Assessment/Calculations          Physical Exam VS:  BP (!) 152/82 (BP Location: Left Arm, Patient Position: Sitting, Cuff Size: Normal)   Pulse 63   Ht 5' 4 (1.626 m)   Wt 170 lb 12.8 oz (77.5 kg)   SpO2 98%   BMI 29.32 kg/m  Wt Readings from Last 3 Encounters:  04/15/24 170 lb 12.8 oz (77.5 kg)  04/15/24 170 lb 12.8 oz (77.5 kg)  10/13/23 165 lb 3.2 oz (74.9 kg)    GEN: Well nourished, well developed in no acute distress NECK: No JVD; No carotid bruits CARDIAC: RRR, no murmurs, rubs, gallops RESPIRATORY:  Clear to auscultation without rales, wheezing or rhonchi  ABDOMEN: Soft, non-tender, non-distended EXTREMITIES:  No edema; No deformity   ASSESSMENT AND PLAN  CAD s/p CABG with unstable angina  - Patient has a history of CABG in 2006 with LIMA-LAD and SVG-distal LAD.  She underwent stenting of D1 in 2020 and distal RCA in 2021.  Relook cath by December 2021 showed stable anatomy, atretic LIMA to LAD, patent SVG to distal LAD  - Patient has been having intermittent left-sided chest pain radiating down the left arm since  mid January.  Symptom significantly worsened last night and patient had 1 hour of chest pain.  - Given the fact that her current chest pain is reminiscent of the previous angina, I discussed the case with DOD Dr. Jordan, our initial plan was for outpatient cardiac catheterization next week, however patient had worsening arm pain prompting us  to send her to the emergency room.  She will need initial ED workup and likely cardiology evaluation  2. Hypertension  - Blood pressure is elevated today, however normally it is well-controlled, consider increasing Imdur  to 90 mg daily.  Likely driven by pain, she was visibly uncomfortable in the cardiology office  3. Hyperlipidemia  - On atorvastatin   4. DM2        Dispo: Patient is going to the emergency room  Signed, Scot Ford, PA  "

## 2024-04-15 NOTE — Patient Instructions (Signed)
 Medication Instructions:  NO CHANGES *If you need a refill on your cardiac medications before your next appointment, please call your pharmacy*  Lab Work: NO LABS If you have labs (blood work) drawn today and your tests are completely normal, you will receive your results only by: MyChart Message (if you have MyChart) OR A paper copy in the mail If you have any lab test that is abnormal or we need to change your treatment, we will call you to review the results.  Testing/Procedures: NO TESTING  Follow-Up: At [facility], you and your health needs are our priority.  As part of our continuing mission to provide you with exceptional heart care, our providers are all part of one team.  This team includes your primary Cardiologist (physician) and Advanced Practice Providers or APPs (Physician Assistants and Nurse Practitioners) who all work together to provide you with the care you need, when you need it.  Your next appointment:   FOLLOW UP WILL BE BASED OFF OF HOSPITAL DISCHARGE   Other Instructions HAO MENG, PA-C IS ADVISING YOU TO GO TO HOSPITAL FOR DIRECT ADMISSION

## 2024-04-15 NOTE — H&P (Incomplete)
 "  Cardiology Admission History and Physical   Patient ID: Tamara Nunez MRN: [id number]; DOB: [birth year]   Admission date: 04/15/2024  PCP:  Ezzard Elsie NOVAK, MD   [facility] Providers Cardiologist:  Vinie JAYSON Maxcy, MD   { Click here to update MD or APP on Care Team, Refresh:1}    Chief Complaint:  Chest pain  Patient Profile: Tamara Nunez is a 69 y.o. female with  CAD s/p CABG 2006, DES to proxRCA 09/2017, DES to proxD1 06/2018, DES to dRCA 08/2019, PET 08/06/22 with LVEF 70% and low risk without ischemia. Additional history of HTN, HLD, DM2, GERD, Fibromyalgia. She is being seen 04/15/2024 for the evaluation of ***.  History of Present Illness: Ms. Deshotel states ***    From chart review, she went to clinic today and mentioned recurrent anginal symptoms which for her are chest pain which radiates down the left arm. Initial plan was to consider outpatient left heart catheterization but during the visit she had recurrent, worsening pain that persisted and this resulted in recommendation to go to the emergency room where she had ongoing pain.    Past Medical History:  Diagnosis Date   Arthritis    in my back (05/06/2017)   CAD (coronary artery disease)    CABG 2006, 09/2016 DES RCA, 04/2017 DES Circ, 06/2018 DES 1st diag   Chronic back pain    Coronary artery disease    a. s/p CABG in 2006 with LIMA-LAD, SVG-dLAD. b. cath in 2009 showing patent grafts. c. 09/2016: cath with occluded LAD but patent SVG-dLAD. LIMA was atretic. pRCA with 80% stenosis --> treated with DES   Depression    Fibromyalgia    GERD (gastroesophageal reflux disease)    Hyperlipidemia    Hypertension    Type II diabetes mellitus (HCC)    Past Surgical History:  Procedure Laterality Date   ABDOMINAL HERNIA REPAIR  2018   BACK SURGERY     CARDIAC CATHETERIZATION  01/13/2008   COLONOSCOPY     CORONARY ANGIOPLASTY WITH STENT PLACEMENT  09/26/2016   Prox RCA lesion, 80 %stenosed. DES    CORONARY  ARTERY BYPASS GRAFT  12/23/2004   2 vessel - LIMA to LAD (atretic), SVG to distal LAD   CORONARY ATHERECTOMY N/A 08/16/2019   Procedure: CORONARY ATHERECTOMY;  Surgeon: Jordan, Peter M, MD;  Location: [facility] INVASIVE CV LAB;  Service: Cardiovascular;  Laterality: N/A;   CORONARY STENT INTERVENTION N/A 09/26/2016   Procedure: Coronary Stent Intervention;  Surgeon: Anner Alm ORN, MD;  Location: [facility] INVASIVE CV LAB;  Service: Cardiovascular;  Laterality: N/A;   CORONARY STENT INTERVENTION N/A 05/06/2017   Procedure: CORONARY STENT INTERVENTION;  Surgeon: Verlin Lonni BIRCH, MD;  Location: [facility] INVASIVE CV LAB;  Service: Cardiovascular;  Laterality: N/A;   CORONARY STENT INTERVENTION N/A 06/25/2018   Procedure: CORONARY STENT INTERVENTION;  Surgeon: Jordan, Peter M, MD;  Location: [facility] INVASIVE CV LAB;  Service: Cardiovascular;  Laterality: N/A;   CORONARY STENT INTERVENTION N/A 08/12/2019   Procedure: CORONARY STENT INTERVENTION;  Surgeon: Claudene Victory ORN, MD;  Location: [facility] INVASIVE CV LAB;  Service: Cardiovascular;  Laterality: N/A;   CORONARY STENT INTERVENTION N/A 08/16/2019   Procedure: CORONARY STENT INTERVENTION;  Surgeon: Jordan, Peter M, MD;  Location: [facility] INVASIVE CV LAB;  Service: Cardiovascular;  Laterality: N/A;   DOPPLER ECHOCARDIOGRAPHY  04/12/2008   EF 60%   GASTRIC BYPASS  2009   HERNIA REPAIR     LEFT HEART CATH AND CORONARY  ANGIOGRAPHY N/A 05/06/2017   Procedure: LEFT HEART CATH AND CORONARY ANGIOGRAPHY;  Surgeon: Verlin Lonni BIRCH, MD;  Location: [facility] INVASIVE CV LAB;  Service: Cardiovascular;  Laterality: N/A;   LEFT HEART CATH AND CORS/GRAFTS ANGIOGRAPHY N/A 09/26/2016   Procedure: Left Heart Cath and Cors/Grafts Angiography;  Surgeon: Anner Alm ORN, MD;  Location: [facility] INVASIVE CV LAB;  Service: Cardiovascular;  Laterality: N/A;   LEFT HEART CATH AND CORS/GRAFTS ANGIOGRAPHY N/A 06/25/2018   Procedure: LEFT HEART CATH AND CORS/GRAFTS ANGIOGRAPHY;  Surgeon: Jordan, Peter M, MD;  Location:  [facility] INVASIVE CV LAB;  Service: Cardiovascular;  Laterality: N/A;   LEFT HEART CATH AND CORS/GRAFTS ANGIOGRAPHY N/A 08/12/2019   Procedure: LEFT HEART CATH AND CORS/GRAFTS ANGIOGRAPHY;  Surgeon: Claudene Victory ORN, MD;  Location: [facility] INVASIVE CV LAB;  Service: Cardiovascular;  Laterality: N/A;   LEFT HEART CATH AND CORS/GRAFTS ANGIOGRAPHY N/A 03/08/2020   Procedure: LEFT HEART CATH AND CORS/GRAFTS ANGIOGRAPHY;  Surgeon: Jordan, Peter M, MD;  Location: [facility] INVASIVE CV LAB;  Service: Cardiovascular;  Laterality: N/A;   NM MYOVIEW  LTD  06/03/2012   EF 71%   POSTERIOR FUSION LUMBAR SPINE  1990s   TOTAL KNEE ARTHROPLASTY Left 02/26/2021   Procedure: TOTAL KNEE ARTHROPLASTY;  Surgeon: Ernie Cough, MD;  Location: [facility] ORS;  Service: Orthopedics;  Laterality: Left;   TOTAL KNEE ARTHROPLASTY Right 08/13/2021   Procedure: TOTAL KNEE ARTHROPLASTY;  Surgeon: Ernie Cough, MD;  Location: [facility] ORS;  Service: Orthopedics;  Laterality: Right;   TUBAL LIGATION     VAGINAL HYSTERECTOMY     partial     Medications Prior to Admission: Prior to Admission medications  Medication Sig Start Date End Date Taking? Authorizing Provider  Accu-Chek Softclix Lancets lancets USE TO TEST BLOOD GLUCOSE TWICE  DAILY. E11.65 03/24/23   Nida, Gebreselassie W, MD  acetaminophen  (TYLENOL ) 500 MG tablet Take 2 tablets (1,000 mg total) by mouth every 6 (six) hours. 08/14/21   Patti Rosina SAUNDERS, PA-C  albuterol  (VENTOLIN  HFA) 108 (90 Base) MCG/ACT inhaler Inhale 1 puff into the lungs every 6 (six) hours as needed for wheezing.    [provider]  amLODipine  (NORVASC ) 2.5 MG tablet Take 1 tablet (2.5 mg total) by mouth daily. 08/26/19 04/15/24  Meng, Hao, PA  atorvastatin  (LIPITOR) 40 MG tablet Take 1 tablet (40 mg total) by mouth daily. 08/17/19   Dunn, Dayna N, PA-C  Blood Glucose Monitoring Suppl (ACCU-CHEK GUIDE ME) w/Device KIT Use to test blood glucose twice daily as directed. 03/19/23   Nida, Gebreselassie W, MD  BRILINTA  60 MG TABS  tablet Take 1 tablet (60 mg total) by mouth 2 (two) times daily. 06/08/23   Meng, Hao, PA  cetirizine (ZYRTEC) 10 MG tablet Take 10 mg by mouth daily.    [provider]  Cholecalciferol  50 MCG (2000 UT) CAPS Take 1 capsule (2,000 Units total) by mouth daily with breakfast. 10/13/23   Nida, Gebreselassie W, MD  Coenzyme Q10 (CO Q-10) 200 MG CAPS Take 200 mg by mouth daily.    [provider]  Cyanocobalamin  (VITAMIN B-12 PO) Take 3 tablets by mouth daily.    [provider]  cyclobenzaprine  (FLEXERIL ) 10 MG tablet Take 1 tablet (10 mg total) by mouth 3 (three) times daily as needed for muscle spasms. 08/14/21   Patti Rosina SAUNDERS, PA-C  diclofenac  (VOLTAREN ) 75 MG EC tablet Take 75 mg by mouth 2 (two) times daily as needed. 09/02/23   [provider]  diclofenac  sodium (VOLTAREN ) 1 %  GEL Apply 1 application. topically in the morning and at bedtime. 10/31/18   [provider]  dicyclomine  (BENTYL ) 10 MG capsule Take 10 mg by mouth every 6 (six) hours as needed for spasms. 01/06/20   [provider]  Docusate Calcium  (STOOL SOFTENER PO) Take 1 capsule by mouth daily.    [provider]  EPINEPHrine  0.3 mg/0.3 mL IJ SOAJ injection Inject 0.3 mg into the muscle as needed for anaphylaxis. 05/20/21   [provider]  Fingerstix Lancets MISC Inject into the skin. 03/23/19   [provider]  fluticasone  (FLONASE ) 50 MCG/ACT nasal spray Place 1 spray into both nostrils 2 (two) times daily.    [provider]  furosemide  (LASIX ) 40 MG tablet Take 40 mg by mouth daily as needed for fluid or edema.    [provider]  gabapentin  (NEURONTIN ) 600 MG tablet Take 600 mg by mouth in the morning, at noon, in the evening, and at bedtime. 09/07/17   [provider]  glucose blood (ACCU-CHEK GUIDE TEST) test strip 1 each by Other route 2 (two) times daily. Use as instructed bid. E11.65 03/24/23   Lenis Ethelle ORN, MD   glucose blood (ACCU-CHEK GUIDE) test strip USE TO CHECK BLOOD GLUCOSE DAILY AS DIRECTED 04/14/22   Nida, Gebreselassie W, MD  glucose blood (ACCU-CHEK GUIDE) test strip for testing as directed Dx E11.65 07/22/22   Nida, Gebreselassie W, MD  glucose blood (ACCU-CHEK GUIDE) test strip 1 each by Other route in the morning and at bedtime. Test BG bid. Dx E11.65 07/23/22   Nida, Gebreselassie W, MD  Iron-Vitamin C (VITRON-C PO) Take 1 tablet by mouth daily.    [provider]  isosorbide  mononitrate (IMDUR ) 60 MG 24 hr tablet Take 1 tablet (60 mg total) by mouth daily. 02/22/24   Hilty, Vinie BROCKS, MD  lidocaine  (LIDODERM ) 5 % Place 1 patch onto the skin daily as needed. Remove & Discard patch within 12 hours or as directed by MD 03/04/21   Elnor Jayson LABOR, DO  meloxicam (MOBIC) 15 MG tablet Take 15 mg by mouth daily.    [provider]  methocarbamol  (ROBAXIN ) 500 MG tablet Take 1 tablet (500 mg total) by mouth every 8 (eight) hours as needed for muscle spasms. 01/27/23   Meyran, Suzen Lacks, NP  metoprolol  tartrate (LOPRESSOR ) 50 MG tablet Take 1 tablet (50 mg total) by mouth 2 (two) times daily. 06/13/20   Meng, Hao, PA  Multiple Vitamin (MULTIVITAMIN WITH MINERALS) TABS tablet Take 2 tablets by mouth daily.    [provider]  nitroGLYCERIN  (NITROLINGUAL ) 0.4 MG/SPRAY spray Place 1 spray under the tongue every 5 (five) minutes x 3 doses as needed for chest pain. 04/15/24   Meng, Hao, PA  nitroGLYCERIN  (NITROSTAT ) 0.4 MG SL tablet Place 1 tablet (0.4 mg total) under the tongue every 5 (five) minutes as needed for chest pain. 04/15/24 07/14/24  Meng, Hao, PA  oxyCODONE -acetaminophen  (PERCOCET/ROXICET) 5-325 MG tablet Take 1-2 tablets by mouth every 6 (six) hours as needed for severe pain (pain score 7-10). 01/27/23   Meyran, Suzen Lacks, NP  pantoprazole  (PROTONIX ) 40 MG tablet Take 40 mg by mouth 2 (two) times daily as needed (acid reflux).    [provider]  pantoprazole   (PROTONIX ) 40 MG tablet Take 40 mg by mouth daily. 05/15/23   [provider]  polyethylene glycol (MIRALAX  / GLYCOLAX ) 17 g packet Take 17 g by mouth daily as needed for mild constipation. 02/27/21  Patti Rosina SAUNDERS, PA-C  ranolazine  (RANEXA ) 1000 MG SR tablet Take 1 tablet (1,000 mg total) by mouth 2 (two) times daily. 06/16/13   Hilty, Vinie BROCKS, MD  senna (SENOKOT) 8.6 MG tablet Take 1 tablet by mouth daily.    [provider]  tamsulosin (FLOMAX) 0.4 MG CAPS capsule Take 0.4 mg by mouth daily. 05/15/23   [provider]  traMADol  (ULTRAM ) 50 MG tablet Take 50 mg by mouth every 6 (six) hours as needed. for pain 03/18/23   [provider]  vitamin E  400 UNIT capsule Take 400 Units by mouth daily.    [provider]     Allergies:   Allergies[1]  Social History:   Social History   Socioeconomic History   Marital status: Married    Spouse name: Not on file   Number of children: Not on file   Years of education: Not on file   Highest education level: Associate degree: academic program  Occupational History   Not on file  Tobacco Use   Smoking status: Never   Smokeless tobacco: Never  Vaping Use   Vaping status: Never Used  Substance and Sexual Activity   Alcohol use: No   Drug use: No   Sexual activity: Not on file  Other Topics Concern   Not on file  Social History Narrative   Not on file   Social Drivers of Health   Tobacco Use: Low Risk (04/15/2024)   Patient History    Smoking Tobacco Use: Never    Smokeless Tobacco Use: Never    Passive Exposure: Not on file  Financial Resource Strain: Patient Declined (04/13/2024)   Overall Financial Resource Strain (CARDIA)    Difficulty of Paying Living Expenses: Patient declined  Food Insecurity: Patient Declined (04/13/2024)   Epic    Worried About Programme Researcher, Broadcasting/film/video in the Last Year: Patient declined    Barista in the Last Year: Patient declined  Transportation Needs: No  Transportation Needs (04/13/2024)   Epic    Lack of Transportation (Medical): No    Lack of Transportation (Non-Medical): No  Physical Activity: Unknown (04/13/2024)   Exercise Vital Sign    Days of Exercise per Week: Patient declined    Minutes of Exercise per Session: Not on file  Stress: Stress Concern Present (04/13/2024)   [employer] of Occupational Health - Occupational Stress Questionnaire    Feeling of Stress: To some extent  Social Connections: Moderately Integrated (04/13/2024)   Social Connection and Isolation Panel    Frequency of Communication with Friends and Family: More than three times a week    Frequency of Social Gatherings with Friends and Family: Once a week    Attends Religious Services: Patient declined    Database Administrator or Organizations: Yes    Attends Banker Meetings: 1 to 4 times per year    Marital Status: Married  Catering Manager Violence: Not At Risk (01/23/2023)   Humiliation, Afraid, Rape, and Kick questionnaire    Fear of Current or Ex-Partner: No    Emotionally Abused: No    Physically Abused: No    Sexually Abused: No  Depression (PHQ2-9): Not on file  Alcohol Screen: Not on file  Housing: Low Risk (04/13/2024)   Epic    Unable to Pay for Housing in the Last Year: No    Number of Times Moved in the Last Year: 0    Homeless in the Last Year: No  Utilities: Not At Risk (01/23/2023)   [facility] Utilities    Threatened with loss of utilities: No  Health Literacy: Not on file     Family History:   The patient's family history includes Cancer - Lung in her father; Heart disease in her mother.    ROS:  Please see the history of present illness.  All other ROS reviewed and negative.     Physical Exam/Data: Vitals:   04/15/24 1557 04/15/24 1605 04/15/24 1608 04/15/24 2012  BP:   (!) 147/80 (!) 152/77  Pulse:   67 69  Resp:   16 19  Temp:   (!) 97.5 F (36.4 C) 98.2 F (36.8 C)  SpO2:   100% 100%  Weight: 77.5 kg 77.5 kg     Height: 5' 4 (1.626 m) 5' 4 (1.626 m)     No intake or output data in the 24 hours ending 04/15/24 2036    04/15/2024    4:05 PM 04/15/2024    3:57 PM 04/15/2024    2:37 PM  Last 3 Weights  Weight (lbs) 170 lb 13.7 oz 170 lb 12.8 oz 170 lb 12.8 oz  Weight (kg) 77.5 kg 77.474 kg 77.474 kg     Body mass index is 29.33 kg/m.    General: Well appearing, NAD HEENT: EOMI, No scleral icterus CV: Regular rate and rhythm. No M/G/R. 2+ radial pulses. Pulmonary: Clear to auscultation bilaterally. Abdomen: Soft, nontender. Extremities: No lower extremity edema. Neuro: Awake, alert.   EKG:  The ECG that was done *** was personally reviewed and demonstrates NSR, RBBB, no ischemic changes.  Relevant CV Studies: ***  Laboratory Data: High Sensitivity Troponin:  No results for input(s): TROPONINIHS in the last 720 hours.    Chemistry Recent Labs  Lab 04/15/24 1623  NA 140  K 4.1  CL 107  CO2 22  GLUCOSE 82  BUN 11  CREATININE 0.88  CALCIUM  8.9  GFRNONAA >60  ANIONGAP 11    No results for input(s): PROT, ALBUMIN , AST, ALT, ALKPHOS, BILITOT in the last 168 hours. Lipids No results for input(s): CHOL, TRIG, HDL, LABVLDL, LDLCALC, CHOLHDL in the last 168 hours. Hematology Recent Labs  Lab 04/15/24 1623  WBC 5.3  RBC 4.29  HGB 13.3  HCT 40.1  MCV 93.5  MCH 31.0  MCHC 33.2  RDW 13.3  PLT 236   Thyroid  Recent Labs  Lab 04/15/24 1853  TSH 1.330   BNP Recent Labs  Lab 04/15/24 1623  PROBNP 238.0    DDimer No results for input(s): DDIMER in the last 168 hours.  Radiology/Studies:  DG Chest 2 View Result Date: 04/15/2024 CLINICAL DATA:  Chest pain radiating to the left arm. EXAM: CHEST - 2 VIEW COMPARISON:  February 19, 2020 FINDINGS: A right-sided venous Port-A-Cath is seen with its distal tip noted near the junction of the superior vena cava and right atrium. Multiple sternal wires and vascular clips are present. The heart size and  mediastinal contours are within normal limits. Both lungs are clear. Postoperative changes are seen within the lower thoracic and upper lumbar spine. IMPRESSION: 1. Evidence of prior median sternotomy/CABG. 2. No active cardiopulmonary disease. Electronically Signed   By: Suzen Dials M.D.   On: 04/15/2024 18:47     Assessment and Plan: ***  Risk Assessment/Risk Scores: {Complete the following score calculators/questions to meet required metrics.  Press F2:1}  {Yes                             :  [id number]}     Code Status: Full Code  Severity of Illness: The appropriate patient status for this patient is INPATIENT. Inpatient status is judged to be reasonable and necessary in order to provide the required intensity of service to ensure the patient's safety. The patient's presenting symptoms, physical exam findings, and initial radiographic and laboratory data in the context of their chronic comorbidities is felt to place them at high risk for further clinical deterioration. Furthermore, it is not anticipated that the patient will be medically stable for discharge from the hospital within 2 midnights of admission.   * I certify that at the point of admission it is my clinical judgment that the patient will require inpatient hospital care spanning beyond 2 midnights from the point of admission due to high intensity of service, high risk for further deterioration and high frequency of surveillance required.*  For questions or updates, please contact [facility] Please consult [email or web address] for contact info under     Signed, Marlin DELENA Rhody, MD  04/15/2024 8:36 PM      [1]  Allergies Allergen Reactions   Codeine Hives and Shortness Of Breath        Other Anaphylaxis    Nuts   Ciprofloxacin Itching   Valsartan Rash   Adhesive [Tape] Itching    clear tape used after surgery.took all her skin off.  Paper take is ok   Isosorbide  Nitrate Other (See Comments)   Metformin   Other (See Comments)   Sulfa Antibiotics Nausea Only   "

## 2024-04-16 MED ORDER — MORPHINE SULFATE (PF) 4 MG/ML IV SOLN: 4.0000 mg | Freq: Once | INTRAVENOUS | Status: AC

## 2024-04-16 MED ORDER — ONDANSETRON HCL 4 MG/2ML IJ SOLN: 4.0000 mg | Freq: Once | INTRAMUSCULAR | Status: AC

## 2024-04-16 NOTE — ED Provider Notes (Incomplete)
 "  Emergency Department Provider Note   I have reviewed the triage vital signs and the nursing notes.   HISTORY  Chief Complaint Chest Pain   HPI Tamara Nunez is a 69 y.o. female ***   {**SYMPTOM/COMPLAINT  LOCATION (describe anatomically) DURATION (when did it start) TIMING (onset and pattern) SEVERITY (0-10, mild/moderate/severe) QUALITY (description of symptoms) CONTEXT (recent surgery, new meds, activity, etc.) MODIFYINGFACTORS (what makes it better/worse) ASSOCIATEDSYMPTOMS (pertinent positives and negatives)**}  Past Medical History:  Diagnosis Date   Arthritis    in my back (05/06/2017)   CAD (coronary artery disease)    CABG 2006, 09/2016 DES RCA, 04/2017 DES Circ, 06/2018 DES 1st diag   Chronic back pain    Coronary artery disease    a. s/p CABG in 2006 with LIMA-LAD, SVG-dLAD. b. cath in 2009 showing patent grafts. c. 09/2016: cath with occluded LAD but patent SVG-dLAD. LIMA was atretic. pRCA with 80% stenosis --> treated with DES   Depression    Fibromyalgia    GERD (gastroesophageal reflux disease)    Hyperlipidemia    Hypertension    Type II diabetes mellitus (HCC)     Review of Systems {** Revise as appropriate then delete this line - Documentation of 10 systems OR 2 systems and 10-point ROS otherwise negative is required **}Constitutional: No fever/chills Eyes: No visual changes. ENT: No sore throat. Cardiovascular: Denies chest pain. Respiratory: Denies shortness of breath. Gastrointestinal: No abdominal pain.  No nausea, no vomiting.  No diarrhea.  No constipation. Genitourinary: Negative for dysuria. Musculoskeletal: Negative for back pain. Skin: Negative for rash. Neurological: Negative for headaches, focal weakness or numbness. {**Psychiatric:  Endocrine:  Hematological/Lymphatic:  Allergic/Immunilogical: **}  ____________________________________________   PHYSICAL EXAM:  VITAL SIGNS: ED Triage Vitals  Encounter Vitals Group      BP 04/15/24 1608 (!) 147/80     Girls Systolic BP Percentile --      Girls Diastolic BP Percentile --      Boys Systolic BP Percentile --      Boys Diastolic BP Percentile --      Pulse Rate 04/15/24 1608 67     Resp 04/15/24 1608 16     Temp 04/15/24 1608 (!) 97.5 F (36.4 C)     Temp Source 04/15/24 2352 Oral     SpO2 04/15/24 1608 100 %     Weight 04/15/24 1557 170 lb 12.8 oz (77.5 kg)     Height 04/15/24 1557 5' 4 (1.626 m)     Head Circumference --      Peak Flow --      Pain Score 04/15/24 1557 7     Pain Loc --      Pain Education --      Exclude from Growth Chart --    {** Revise as appropriate then delete this line - 8 systems required **} Constitutional: Alert and oriented. Well appearing and in no acute distress. Eyes: Conjunctivae are normal. PERRL. EOMI. Head: Atraumatic. {**Ears:  Healthy appearing ear canals and TMs bilaterally **}Nose: No congestion/rhinnorhea. Mouth/Throat: Mucous membranes are moist.  Oropharynx non-erythematous. Neck: No stridor.  No meningeal signs.  {**No cervical spine tenderness to palpation.**} Cardiovascular: Normal rate, regular rhythm. Good peripheral circulation. Grossly normal heart sounds.   Respiratory: Normal respiratory effort.  No retractions. Lungs CTAB. Gastrointestinal: Soft and nontender. No distention.  {**Genitourinary:  **}Musculoskeletal: No lower extremity tenderness nor edema. No gross deformities of extremities. Neurologic:  Normal speech and language. No gross focal neurologic  deficits are appreciated.  Skin:  Skin is warm, dry and intact. No rash noted. {**Psychiatric: Mood and affect are normal. Speech and behavior are normal.**}  ____________________________________________   LABS (all labs ordered are listed, but only abnormal results are displayed)  Labs Reviewed  TROPONIN T, HIGH SENSITIVITY - Abnormal; Notable for the following components:      Result Value   Troponin T High Sensitivity 23 (*)     All other components within normal limits  TROPONIN T, HIGH SENSITIVITY - Abnormal; Notable for the following components:   Troponin T High Sensitivity 22 (*)    All other components within normal limits  BASIC METABOLIC PANEL WITH GFR  CBC  TSH  PRO BRAIN NATRIURETIC PEPTIDE   ____________________________________________  EKG  *** ____________________________________________  RADIOLOGY  DG Chest 2 View Result Date: 04/15/2024 CLINICAL DATA:  Chest pain radiating to the left arm. EXAM: CHEST - 2 VIEW COMPARISON:  February 19, 2020 FINDINGS: A right-sided venous Port-A-Cath is seen with its distal tip noted near the junction of the superior vena cava and right atrium. Multiple sternal wires and vascular clips are present. The heart size and mediastinal contours are within normal limits. Both lungs are clear. Postoperative changes are seen within the lower thoracic and upper lumbar spine. IMPRESSION: 1. Evidence of prior median sternotomy/CABG. 2. No active cardiopulmonary disease. Electronically Signed   By: Suzen Dials M.D.   On: 04/15/2024 18:47    ____________________________________________   PROCEDURES  Procedure(s) performed:   Procedures   ____________________________________________   INITIAL IMPRESSION / ASSESSMENT AND PLAN / ED COURSE  Pertinent labs & imaging results that were available during my care of the patient were reviewed by me and considered in my medical decision making (see chart for details).   This patient is Presenting for Evaluation of ***, which {Range:23949} require a range of treatment options, and {MDMcomplaint:23950} a complaint that involves a {MDMlevelrisk:23951} risk of morbidity and mortality.  The Differential Diagnoses include***.  Critical Interventions-    Medications  morphine  (PF) 4 MG/ML injection 4 mg (has no administration in time range)  ondansetron  (ZOFRAN ) injection 4 mg (has no administration in time range)     Reassessment after intervention:     I *** Additional Historical Information from ***, as the patient is ***.  I decided to review pertinent External Data, and in summary ***.   Clinical Laboratory Tests Ordered, included   Radiologic Tests Ordered, included ***. I independently interpreted the images and agree with radiology interpretation.   Cardiac Monitor Tracing which shows ***   Social Determinants of Health Risk ***  Consult complete with  Medical Decision Making: Summary: ***  Reevaluation with update and discussion with   ***Considered admission***  Patient's presentation is most consistent with {EM COPA:27473}   Disposition:   ____________________________________________  FINAL CLINICAL IMPRESSION(S) / ED DIAGNOSES  Final diagnoses:  None     NEW OUTPATIENT MEDICATIONS STARTED DURING THIS VISIT:  New Prescriptions   No medications on file    Note:  This document was prepared using Dragon voice recognition software and may include unintentional dictation errors.  Fonda Law, MD, [facility] Emergency Medicine  "

## 2024-04-20 ENCOUNTER — Ambulatory Visit: Admitting: "Endocrinology
